# Patient Record
Sex: Male | Born: 1940 | Race: White | Hispanic: No | Smoking: Former smoker
Health system: Southern US, Community
[De-identification: ages and names within clinical notes are randomized; demographics above are authoritative.]

## PROBLEM LIST (undated history)

## (undated) DIAGNOSIS — G309 Alzheimer's disease, unspecified: Secondary | ICD-10-CM

## (undated) DIAGNOSIS — M170 Bilateral primary osteoarthritis of knee: Secondary | ICD-10-CM

## (undated) DIAGNOSIS — J189 Pneumonia, unspecified organism: Secondary | ICD-10-CM

## (undated) DIAGNOSIS — Z974 Presence of external hearing-aid: Secondary | ICD-10-CM

## (undated) DIAGNOSIS — F0280 Dementia in other diseases classified elsewhere without behavioral disturbance: Secondary | ICD-10-CM

## (undated) DIAGNOSIS — M1990 Unspecified osteoarthritis, unspecified site: Secondary | ICD-10-CM

## (undated) DIAGNOSIS — F0390 Unspecified dementia without behavioral disturbance: Secondary | ICD-10-CM

## (undated) DIAGNOSIS — M109 Gout, unspecified: Secondary | ICD-10-CM

## (undated) DIAGNOSIS — C61 Malignant neoplasm of prostate: Secondary | ICD-10-CM

## (undated) HISTORY — PX: PROSTATE BIOPSY: SHX241

## (undated) HISTORY — DX: Alzheimer's disease, unspecified: G30.9

## (undated) HISTORY — DX: Unspecified osteoarthritis, unspecified site: M19.90

## (undated) HISTORY — PX: INSERTION PROSTATE RADIATION SEED: SUR718

## (undated) HISTORY — DX: Presence of external hearing-aid: Z97.4

## (undated) HISTORY — DX: Bilateral primary osteoarthritis of knee: M17.0

## (undated) HISTORY — PX: HEMORROIDECTOMY: SUR656

## (undated) HISTORY — PX: TONSILLECTOMY AND ADENOIDECTOMY: SUR1326

## (undated) HISTORY — PX: COLONOSCOPY: SHX174

## (undated) HISTORY — DX: Dementia in other diseases classified elsewhere, unspecified severity, without behavioral disturbance, psychotic disturbance, mood disturbance, and anxiety: F02.80

---

## 1997-07-09 ENCOUNTER — Encounter: Admission: RE | Admit: 1997-07-09 | Discharge: 1997-10-07 | Payer: Self-pay | Admitting: *Deleted

## 2003-03-19 ENCOUNTER — Encounter: Admission: RE | Admit: 2003-03-19 | Discharge: 2003-03-19 | Payer: Self-pay | Admitting: Family Medicine

## 2003-09-05 ENCOUNTER — Encounter: Admission: RE | Admit: 2003-09-05 | Discharge: 2003-09-05 | Payer: Self-pay | Admitting: Family Medicine

## 2003-09-17 ENCOUNTER — Encounter: Admission: RE | Admit: 2003-09-17 | Discharge: 2003-09-17 | Payer: Self-pay | Admitting: Family Medicine

## 2004-01-09 ENCOUNTER — Ambulatory Visit: Payer: Self-pay | Admitting: Family Medicine

## 2004-01-15 ENCOUNTER — Ambulatory Visit: Payer: Self-pay | Admitting: Family Medicine

## 2004-01-25 ENCOUNTER — Ambulatory Visit: Payer: Self-pay | Admitting: Family Medicine

## 2004-01-29 ENCOUNTER — Encounter: Admission: RE | Admit: 2004-01-29 | Discharge: 2004-01-29 | Payer: Self-pay | Admitting: Family Medicine

## 2004-04-15 ENCOUNTER — Ambulatory Visit: Payer: Self-pay | Admitting: Family Medicine

## 2004-05-28 ENCOUNTER — Ambulatory Visit: Payer: Self-pay | Admitting: Family Medicine

## 2004-08-01 ENCOUNTER — Ambulatory Visit: Payer: Self-pay | Admitting: Family Medicine

## 2004-10-22 ENCOUNTER — Ambulatory Visit: Payer: Self-pay | Admitting: Family Medicine

## 2004-11-10 ENCOUNTER — Ambulatory Visit: Payer: Self-pay | Admitting: Family Medicine

## 2004-11-12 ENCOUNTER — Encounter: Admission: RE | Admit: 2004-11-12 | Discharge: 2004-11-12 | Payer: Self-pay | Admitting: Family Medicine

## 2004-11-12 DIAGNOSIS — J8410 Pulmonary fibrosis, unspecified: Secondary | ICD-10-CM

## 2004-11-19 ENCOUNTER — Ambulatory Visit: Payer: Self-pay | Admitting: Family Medicine

## 2004-11-25 ENCOUNTER — Ambulatory Visit: Payer: Self-pay | Admitting: Family Medicine

## 2005-01-07 ENCOUNTER — Ambulatory Visit: Payer: Self-pay | Admitting: Family Medicine

## 2005-02-23 ENCOUNTER — Encounter (INDEPENDENT_AMBULATORY_CARE_PROVIDER_SITE_OTHER): Payer: Self-pay | Admitting: Internal Medicine

## 2005-02-23 LAB — CONVERTED CEMR LAB: PSA: 1.86 ng/mL

## 2005-03-10 ENCOUNTER — Ambulatory Visit: Payer: Self-pay | Admitting: Family Medicine

## 2005-03-25 ENCOUNTER — Ambulatory Visit: Payer: Self-pay | Admitting: Family Medicine

## 2005-04-07 ENCOUNTER — Ambulatory Visit: Payer: Self-pay | Admitting: Family Medicine

## 2005-04-08 ENCOUNTER — Ambulatory Visit: Payer: Self-pay | Admitting: Family Medicine

## 2005-04-23 ENCOUNTER — Ambulatory Visit: Payer: Self-pay | Admitting: Internal Medicine

## 2005-04-24 ENCOUNTER — Ambulatory Visit: Payer: Self-pay | Admitting: Family Medicine

## 2005-11-24 ENCOUNTER — Ambulatory Visit: Payer: Self-pay | Admitting: Family Medicine

## 2005-11-27 ENCOUNTER — Ambulatory Visit: Payer: Self-pay | Admitting: Family Medicine

## 2006-01-11 ENCOUNTER — Ambulatory Visit: Payer: Self-pay | Admitting: Family Medicine

## 2006-07-22 ENCOUNTER — Ambulatory Visit: Payer: Self-pay | Admitting: *Deleted

## 2006-08-09 ENCOUNTER — Telehealth (INDEPENDENT_AMBULATORY_CARE_PROVIDER_SITE_OTHER): Payer: Self-pay | Admitting: *Deleted

## 2006-08-19 ENCOUNTER — Telehealth (INDEPENDENT_AMBULATORY_CARE_PROVIDER_SITE_OTHER): Payer: Self-pay | Admitting: *Deleted

## 2006-09-28 ENCOUNTER — Ambulatory Visit: Payer: Self-pay | Admitting: Family Medicine

## 2006-10-13 ENCOUNTER — Telehealth (INDEPENDENT_AMBULATORY_CARE_PROVIDER_SITE_OTHER): Payer: Self-pay | Admitting: *Deleted

## 2006-10-15 ENCOUNTER — Ambulatory Visit: Payer: Self-pay | Admitting: Family Medicine

## 2006-10-18 ENCOUNTER — Ambulatory Visit: Payer: Self-pay | Admitting: Internal Medicine

## 2006-10-22 ENCOUNTER — Ambulatory Visit: Payer: Self-pay | Admitting: Family Medicine

## 2006-10-28 ENCOUNTER — Ambulatory Visit: Payer: Self-pay | Admitting: Internal Medicine

## 2006-10-28 ENCOUNTER — Encounter: Payer: Self-pay | Admitting: Internal Medicine

## 2006-10-28 DIAGNOSIS — Z86010 Personal history of colonic polyps: Secondary | ICD-10-CM

## 2006-11-01 ENCOUNTER — Encounter: Admission: RE | Admit: 2006-11-01 | Discharge: 2007-01-30 | Payer: Self-pay | Admitting: Family Medicine

## 2006-11-11 ENCOUNTER — Telehealth: Payer: Self-pay | Admitting: Family Medicine

## 2006-12-16 ENCOUNTER — Encounter (INDEPENDENT_AMBULATORY_CARE_PROVIDER_SITE_OTHER): Payer: Self-pay | Admitting: Internal Medicine

## 2006-12-24 ENCOUNTER — Ambulatory Visit: Payer: Self-pay | Admitting: Family Medicine

## 2007-01-05 ENCOUNTER — Encounter (INDEPENDENT_AMBULATORY_CARE_PROVIDER_SITE_OTHER): Payer: Self-pay | Admitting: Internal Medicine

## 2007-01-05 ENCOUNTER — Telehealth (INDEPENDENT_AMBULATORY_CARE_PROVIDER_SITE_OTHER): Payer: Self-pay | Admitting: Internal Medicine

## 2007-02-16 ENCOUNTER — Encounter (INDEPENDENT_AMBULATORY_CARE_PROVIDER_SITE_OTHER): Payer: Self-pay | Admitting: Internal Medicine

## 2007-03-01 ENCOUNTER — Ambulatory Visit: Payer: Self-pay | Admitting: Family Medicine

## 2007-03-04 ENCOUNTER — Telehealth (INDEPENDENT_AMBULATORY_CARE_PROVIDER_SITE_OTHER): Payer: Self-pay | Admitting: Internal Medicine

## 2007-03-15 ENCOUNTER — Telehealth (INDEPENDENT_AMBULATORY_CARE_PROVIDER_SITE_OTHER): Payer: Self-pay | Admitting: Internal Medicine

## 2007-03-18 ENCOUNTER — Encounter: Admission: RE | Admit: 2007-03-18 | Discharge: 2007-03-18 | Payer: Self-pay | Admitting: Internal Medicine

## 2007-03-18 ENCOUNTER — Ambulatory Visit: Payer: Self-pay | Admitting: Internal Medicine

## 2007-03-18 DIAGNOSIS — J984 Other disorders of lung: Secondary | ICD-10-CM

## 2007-03-22 LAB — CONVERTED CEMR LAB
BUN: 13 mg/dL (ref 6–23)
CO2: 30 meq/L (ref 19–32)
Calcium: 9.5 mg/dL (ref 8.4–10.5)
Chloride: 102 meq/L (ref 96–112)
Creatinine, Ser: 1.2 mg/dL (ref 0.4–1.5)
GFR calc Af Amer: 77 mL/min
GFR calc non Af Amer: 64 mL/min
Glucose, Bld: 99 mg/dL (ref 70–99)
Potassium: 4.1 meq/L (ref 3.5–5.1)
Sodium: 139 meq/L (ref 135–145)

## 2007-04-05 ENCOUNTER — Encounter (INDEPENDENT_AMBULATORY_CARE_PROVIDER_SITE_OTHER): Payer: Self-pay | Admitting: Internal Medicine

## 2007-04-05 ENCOUNTER — Ambulatory Visit: Payer: Self-pay | Admitting: Family Medicine

## 2007-04-06 ENCOUNTER — Encounter (INDEPENDENT_AMBULATORY_CARE_PROVIDER_SITE_OTHER): Payer: Self-pay | Admitting: Internal Medicine

## 2007-04-07 ENCOUNTER — Encounter: Admission: RE | Admit: 2007-04-07 | Discharge: 2007-04-07 | Payer: Self-pay | Admitting: Family Medicine

## 2007-04-08 ENCOUNTER — Telehealth (INDEPENDENT_AMBULATORY_CARE_PROVIDER_SITE_OTHER): Payer: Self-pay | Admitting: Internal Medicine

## 2007-04-11 ENCOUNTER — Ambulatory Visit: Payer: Self-pay | Admitting: Cardiovascular Disease

## 2007-04-27 ENCOUNTER — Ambulatory Visit: Payer: Self-pay | Admitting: Cardiology

## 2007-04-27 ENCOUNTER — Encounter: Payer: Self-pay | Admitting: Cardiovascular Disease

## 2007-04-27 ENCOUNTER — Ambulatory Visit: Payer: Self-pay

## 2007-12-08 ENCOUNTER — Ambulatory Visit: Payer: Self-pay | Admitting: Family Medicine

## 2008-05-03 ENCOUNTER — Ambulatory Visit: Payer: Self-pay | Admitting: Family Medicine

## 2008-05-14 ENCOUNTER — Telehealth: Payer: Self-pay | Admitting: Family Medicine

## 2008-08-17 ENCOUNTER — Ambulatory Visit: Payer: Self-pay | Admitting: Family Medicine

## 2008-08-21 ENCOUNTER — Telehealth (INDEPENDENT_AMBULATORY_CARE_PROVIDER_SITE_OTHER): Payer: Self-pay | Admitting: Internal Medicine

## 2008-10-30 ENCOUNTER — Telehealth (INDEPENDENT_AMBULATORY_CARE_PROVIDER_SITE_OTHER): Payer: Self-pay | Admitting: Internal Medicine

## 2008-12-26 ENCOUNTER — Encounter (INDEPENDENT_AMBULATORY_CARE_PROVIDER_SITE_OTHER): Payer: Self-pay | Admitting: Internal Medicine

## 2008-12-26 ENCOUNTER — Ambulatory Visit: Payer: Self-pay | Admitting: Family Medicine

## 2008-12-26 DIAGNOSIS — M109 Gout, unspecified: Secondary | ICD-10-CM | POA: Insufficient documentation

## 2008-12-26 DIAGNOSIS — R413 Other amnesia: Secondary | ICD-10-CM

## 2008-12-31 ENCOUNTER — Ambulatory Visit: Payer: Self-pay | Admitting: Family Medicine

## 2009-01-09 LAB — CONVERTED CEMR LAB
ALT: 26 units/L (ref 0–53)
AST: 46 units/L — ABNORMAL HIGH (ref 0–37)
Albumin: 3.6 g/dL (ref 3.5–5.2)
Alkaline Phosphatase: 62 units/L (ref 39–117)
BUN: 14 mg/dL (ref 6–23)
Bilirubin, Direct: 0.1 mg/dL (ref 0.0–0.3)
CO2: 29 meq/L (ref 19–32)
Calcium: 8.8 mg/dL (ref 8.4–10.5)
Chloride: 106 meq/L (ref 96–112)
Cholesterol: 163 mg/dL (ref 0–200)
Creatinine, Ser: 1.3 mg/dL (ref 0.4–1.5)
GFR calc non Af Amer: 57.89 mL/min (ref >60)
Glucose, Bld: 109 mg/dL — ABNORMAL HIGH (ref 70–99)
HDL: 44.9 mg/dL (ref >39.00)
LDL Cholesterol: 103 mg/dL — ABNORMAL HIGH (ref 0–99)
PSA: 2.55 ng/mL (ref 0.10–4.00)
Potassium: 4.4 meq/L (ref 3.5–5.1)
Sodium: 144 meq/L (ref 135–145)
TSH: 2.68 microintl units/mL (ref 0.35–5.50)
Total Bilirubin: 0.8 mg/dL (ref 0.3–1.2)
Total CHOL/HDL Ratio: 4
Total Protein: 7 g/dL (ref 6.0–8.3)
Triglycerides: 78 mg/dL (ref 0.0–149.0)
VLDL: 15.6 mg/dL (ref 0.0–40.0)

## 2009-03-14 ENCOUNTER — Ambulatory Visit: Payer: Self-pay | Admitting: Family Medicine

## 2009-03-15 ENCOUNTER — Telehealth: Payer: Self-pay | Admitting: Family Medicine

## 2009-04-03 ENCOUNTER — Ambulatory Visit: Payer: Self-pay | Admitting: Family Medicine

## 2009-05-01 ENCOUNTER — Telehealth: Payer: Self-pay | Admitting: Family Medicine

## 2009-05-01 ENCOUNTER — Ambulatory Visit: Payer: Self-pay | Admitting: Family Medicine

## 2009-05-01 DIAGNOSIS — K5289 Other specified noninfective gastroenteritis and colitis: Secondary | ICD-10-CM | POA: Insufficient documentation

## 2009-06-26 ENCOUNTER — Ambulatory Visit: Payer: Self-pay | Admitting: Family Medicine

## 2009-06-27 LAB — CONVERTED CEMR LAB
ALT: 21 units/L (ref 0–53)
AST: 33 units/L (ref 0–37)
Albumin: 3.8 g/dL (ref 3.5–5.2)
Alkaline Phosphatase: 73 units/L (ref 39–117)
Bilirubin, Direct: 0.1 mg/dL (ref 0.0–0.3)
Cholesterol: 144 mg/dL (ref 0–200)
HDL: 42.3 mg/dL (ref >39.00)
LDL Cholesterol: 74 mg/dL (ref 0–99)
Total Bilirubin: 0.5 mg/dL (ref 0.3–1.2)
Total CHOL/HDL Ratio: 3
Total Protein: 6.5 g/dL (ref 6.0–8.3)
Triglycerides: 140 mg/dL (ref 0.0–149.0)
VLDL: 28 mg/dL (ref 0.0–40.0)

## 2009-10-08 ENCOUNTER — Encounter (INDEPENDENT_AMBULATORY_CARE_PROVIDER_SITE_OTHER): Payer: Self-pay | Admitting: *Deleted

## 2009-10-21 ENCOUNTER — Encounter (INDEPENDENT_AMBULATORY_CARE_PROVIDER_SITE_OTHER): Payer: Self-pay | Admitting: *Deleted

## 2009-11-28 ENCOUNTER — Encounter (INDEPENDENT_AMBULATORY_CARE_PROVIDER_SITE_OTHER): Payer: Self-pay | Admitting: *Deleted

## 2009-12-02 ENCOUNTER — Ambulatory Visit: Payer: Self-pay | Admitting: Internal Medicine

## 2009-12-17 ENCOUNTER — Ambulatory Visit: Payer: Self-pay | Admitting: Internal Medicine

## 2009-12-20 ENCOUNTER — Encounter: Payer: Self-pay | Admitting: Internal Medicine

## 2009-12-26 ENCOUNTER — Ambulatory Visit: Payer: Self-pay | Admitting: Family Medicine

## 2010-01-09 ENCOUNTER — Telehealth: Payer: Self-pay | Admitting: Family Medicine

## 2010-01-31 ENCOUNTER — Ambulatory Visit: Payer: Self-pay | Admitting: Family Medicine

## 2010-01-31 DIAGNOSIS — M25559 Pain in unspecified hip: Secondary | ICD-10-CM

## 2010-02-03 ENCOUNTER — Telehealth: Payer: Self-pay | Admitting: Family Medicine

## 2010-02-06 ENCOUNTER — Telehealth: Payer: Self-pay | Admitting: Family Medicine

## 2010-03-27 NOTE — Progress Notes (Signed)
Summary: pt requests phone call regarding Aricept  Phone Note Call from Patient Call back at Home Phone [phone]   Caller: Patient Call For: Ruthe Mannan MD Summary of Call: Pt is asking that you call him regarding problems with aricept.  He saw something about this on tv and wants to know if you are aware. Initial call taken by: Lowella Petties CMA, AAMA,  January 09, 2010 10:16 AM  Follow-up for Phone Call        Weidman, would you mind calling him since I am in with patients to find out his concern. thanks, talia. Ruthe Mannan MD  January 09, 2010 10:38 AM  Patient says that he went to eat out with some friends and they were talking about how they had seen on tv where Aricept had some serious side effects.  He did not recall what those side effects where.  I asked patient if he would be willing to come in to see Dr. Dayton Martes to discuss this if need be and he agreed to do so.  Please advise.  Linde Gillis CMA Duncan Dull)  January 09, 2010 10:56 AM   Additional Follow-up for Phone Call Additional follow up Details #1::        yes he can do that.  He has been on Aricept for years without issue so he should not be concerned.  At his dosage, very safe. Ruthe Mannan MD  January 09, 2010 11:07 AM  Patient advised as instructed via telephone.  He says that he trusts Dr. Dellie Catholic judgement and is ok with her decision.    Additional Follow-up by: Linde Gillis CMA Duncan Dull),  January 09, 2010 11:17 AM

## 2010-03-27 NOTE — Miscellaneous (Signed)
Summary: [facility]  Clinical Lists Changes  Medications: Added new medication of MOVIPREP 100 GM  SOLR (PEG-KCL-NACL-NASULF-NA ASC-C) As per prep instructions. - Signed Rx of MOVIPREP 100 GM  SOLR (PEG-KCL-NACL-NASULF-NA ASC-C) As per prep instructions.;  #1 x 0;  Signed;  Entered by: Ezra Sites RN;  Authorized by: Iva Boop MD, [facility];  Method used: Electronically to [facility]*, [address], [place], [place]  [address], Ph: [phone], Fax: [phone] Allergies: Changed allergy or adverse reaction from ERYTHROMYCIN ETHYLSUCCINATE to ERYTHROMYCIN ETHYLSUCCINATE Observations: Added new observation of ALLERGY REV: Done (12/02/2009 10:10)    Prescriptions: MOVIPREP 100 GM  SOLR (PEG-KCL-NACL-NASULF-NA ASC-C) As per prep instructions.  #1 x 0   Entered by:   Ezra Sites RN   Authorized by:   Iva Boop MD, [facility]   Signed by:   Ezra Sites RN on 12/02/2009   Method used:   Electronically to        [facility]* (retail)       [address]       [place], [place]  [address]       Ph: [phone]       Fax: [phone]   RxID:   [id number]

## 2010-03-27 NOTE — Progress Notes (Signed)
Summary: fever, diarrhea  Phone Note Call from Patient Call back at Home Phone [phone]   Caller: Patient Call For: Hannah Beat MD Summary of Call: Patient says that he has fever, he is not sure what the temp is because he has no way to check it, he just feels hot to the touch and feels like he has fever. He has had diarrhea for two days and has been taking immodium and it has not helped. He says that he alos is aching all over. He wants to come in and be seen or have something called in.. Do I need to add him to the schedule? [facility].  Initial call taken by: Melody Comas,  May 01, 2009 8:57 AM  Follow-up for Phone Call        i can see at 4:15 Follow-up by: Hannah Beat MD,  May 01, 2009 9:08 AM  Additional Follow-up for Phone Call Additional follow up Details #1::        Patient coming to appt at 4:15 Additional Follow-up by: Benny Lennert CMA Duncan Dull),  May 01, 2009 9:12 AM

## 2010-03-27 NOTE — Letter (Signed)
Summary: Colonoscopy Letter  [facility]  [address] [place], [place] [address]   Phone: [phone]  Fax: [phone]      October 08, 2009 MRN: [id number]   Ricky Boyd [address] [place], [place]  [address]   Dear Mr. FAUTH,   According to your medical record, it is time for you to schedule a Colonoscopy. The American Cancer Society recommends this procedure as a method to detect early colon cancer. Patients with a family history of colon cancer, or a personal history of colon polyps or inflammatory bowel disease are at increased risk.  This letter has beeen generated based on the recommendations made at the time of your procedure. If you feel that in your particular situation this may no longer apply, please contact our office.  Please call our office at [phone] to schedule this appointment or to update your records at your earliest convenience.  Thank you for cooperating with [place] to provide you with the very best care possible.   Sincerely,   Iva Boop, M.D.  [facility]astroenterology Division [phone]

## 2010-03-27 NOTE — Assessment & Plan Note (Signed)
Summary: having memory loss/alc   Vital Signs:  Patient profile:   70 year old male Height:      69 inches Weight:      200 pounds BMI:     29.64 Temp:     97.6 degrees F oral Pulse rate:   80 / minute Pulse rhythm:   regular BP sitting:   110 / 70  (right arm) Cuff size:   regular  Vitals Entered By: Linde Gillis CMA Duncan Dull) (December 26, 2009 11:55 AM) CC: discuss memory issues   History of Present Illness: 70 yo here with his son to discuss his memory problems.  Has been on Aricept 10 mg for over 3 years. Son feels he is doing well and so does [name] but his wife is concerned that it is progressing. At times, has been in his car and will forget where he was going- always remember in a few minutes. Has not had any issues remembering people and overall does well with short term memory.  No anxiety or behavioral issues.  See geriatrics assessment for MMSE.  Allergies: 1)  ! Erythromycin Ethylsuccinate 2)  ! Ibuprofen (Ibuprofen)  Review of Systems      See HPI Psych:  Denies anxiety and depression.  Physical Exam  General:  Well-developed,well-nourished,in no acute distress; alert,appropriate and cooperative throughout examination Psych:  normally interactive, good eye contact, not anxious appearing, and not depressed appearing.     Impression & Recommendations:  Problem # 1:  MEMORY LOSS (ICD-780.93) Assessment Unchanged Time spent with patient 25 minutes, more than 50% of this time was spent evaluating and discussing his memory loss.  Did very well on MMSE.  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 Continue current dose of Aricept.  Pt and son in agreement with plan.  Complete Medication List: 1)  Multivitamins Tabs (Multiple vitamin) .... Take one by mouth daily 2)  Glucosamine Complex Tabs (Nutritional supplements) 3)  Fish Oil 1000 Mg Caps (Omega-3 fatty acids) .... Take two by mouth daily 4)  Saw Palmetto 500 Mg Caps (Saw palmetto (serenoa  repens)) .... Take two by mouth daily 5)  Tylenol Extra Strength 500 Mg Tabs (Acetaminophen) .... As needed 6)  Vitamin C  .... Take one by mouth daily 7)  Aricept 10 Mg Tabs (Donepezil hcl) .... Take 1 tablet by mouth once a day   Orders Added: 1)  Est. Patient Level IV [[address]]    Current Allergies (reviewed today): ! ERYTHROMYCIN ETHYLSUCCINATE ! IBUPROFEN (IBUPROFEN)   Geriatric Assessment:  Mental Status Exam: (value/max value)    Orientation to Time: 5/5    Orientation to Place: 5/5    Registration: 3/3    Attention/Calculation: 5/5    Recall: 2/3    Language-name 2 objects: 2/2    Language-repeat: 1/1    Language-follow 3-step command: 3/3    Language-read and follow direction: 1/1    Write a sentence: 1/1    Copy design: 1/1 MSE Total score: 29/30

## 2010-03-27 NOTE — Letter (Signed)
Summary: Moviprep Instructions  [facility]  [address]   [place], [place] [address]   Phone: [phone]  Fax: [phone]       Ricky Boyd    03-28-1940    MRN: [id number]        Procedure Day Dorna Bloom: Tuesday, 12-17-09     Arrival Time: 9:00 a.m.     Procedure Time: 10:00 a.m.     Location of Procedure:                    x   [facility] (4th Floor)   PREPARATION FOR COLONOSCOPY WITH MOVIPREP   Starting 5 days prior to your procedure 12-12-09  do not eat nuts, seeds, popcorn, corn, beans, peas,  salads, or any raw vegetables.  Do not take any fiber supplements (e.g. Metamucil, Citrucel, and Benefiber).  THE DAY BEFORE YOUR PROCEDURE         DATE: 12-16-09   DAY: Monday  1.  Drink clear liquids the entire day-NO SOLID FOOD  2.  Do not drink anything colored red or purple.  Avoid juices with pulp.  No orange juice.  3.  Drink at least 64 oz. (8 glasses) of fluid/clear liquids during the day to prevent dehydration and help the prep work efficiently.  CLEAR LIQUIDS INCLUDE: Water Jello Ice Popsicles Tea (sugar ok, no milk/cream) Powdered fruit flavored drinks Coffee (sugar ok, no milk/cream) Gatorade Juice: apple, white grape, white cranberry  Lemonade Clear bullion, consomm, broth Carbonated beverages (any kind) Strained chicken noodle soup Hard Candy                             4.  In the morning, mix first dose of MoviPrep solution:    Empty 1 Pouch A and 1 Pouch B into the disposable container    Add lukewarm drinking water to the top line of the container. Mix to dissolve    Refrigerate (mixed solution should be used within 24 hrs)  5.  Begin drinking the prep at 5:00 p.m. The MoviPrep container is divided by 4 marks.   Every 15 minutes drink the solution down to the next mark (approximately 8 oz) until the full liter is complete.   6.  Follow completed prep with 16 oz of clear liquid of your choice (Nothing red or  purple).  Continue to drink clear liquids until bedtime.  7.  Before going to bed, mix second dose of MoviPrep solution:    Empty 1 Pouch A and 1 Pouch B into the disposable container    Add lukewarm drinking water to the top line of the container. Mix to dissolve    Refrigerate  THE DAY OF YOUR PROCEDURE      DATE: 12-17-09  DAY: Tuesday  Beginning at 5:00 a.m. (5 hours before procedure):         1. Every 15 minutes, drink the solution down to the next mark (approx 8 oz) until the full liter is complete.  2. Follow completed prep with 16 oz. of clear liquid of your choice.    3. You may drink clear liquids until  8:00 a.m.  (2 HOURS BEFORE PROCEDURE).   MEDICATION INSTRUCTIONS  Unless otherwise instructed, you should take regular prescription medications with a small sip of water   as early as possible the morning of your procedure.        OTHER INSTRUCTIONS  You will need a  responsible adult at least 72 years of age to accompany you and drive you home.   This person must remain in the waiting room during your procedure.  Wear loose fitting clothing that is easily removed.  Leave jewelry and other valuables at home.  However, you may wish to bring a book to read or  an iPod/MP3 player to listen to music as you wait for your procedure to start.  Remove all body piercing jewelry and leave at home.  Total time from sign-in until discharge is approximately 2-3 hours.  You should go home directly after your procedure and rest.  You can resume normal activities the  day after your procedure.  The day of your procedure you should not:   Drive   Make legal decisions   Operate machinery   Drink alcohol   Return to work  You will receive specific instructions about eating, activities and medications before you leave.    The above instructions have been reviewed and explained to me by   Ezra Sites RN  December 02, 2009 10:43 AM     I fully understand and can  verbalize these instructions _____________________________ Date _________

## 2010-03-27 NOTE — Assessment & Plan Note (Signed)
Summary: fever,diahrrea   Vital Signs:  Patient profile:   70 year old male Height:      69 inches Weight:      206.0 pounds BMI:     30.53 Temp:     97.6 degrees F oral Pulse rate:   92 / minute Pulse rhythm:   regular BP sitting:   110 / 70  (left arm) Cuff size:   large  Vitals Entered By: Benny Lennert CMA Duncan Dull) (May 01, 2009 3:49 PM)  History of Present Illness: Chief complaint fever diarrhea head ache chills and sweating  70 year old patient:  Aches and pains, chills, woke up around tuesday morning and went to the bathroom and started to shake uncontrollably. Profuse sweating. Headaches, diarrhea. Not sure about fever.   Acute Visit History:      The patient complains of diarrhea, fever, and musculoskeletal symptoms.  These symptoms began 3 days ago.  He denies cough, earache, genitourinary symptoms, headache, nasal discharge, nausea, sinus problems, sore throat, and vomiting.        Urine output has been slightly decreased.        Allergies: 1)  ! Erythromycin Ethylsuccinate 2)  ! Ibuprofen (Ibuprofen)  Past History:  Past medical, surgical, family and social histories (including risk factors) reviewed, and no changes noted (except as noted below).  Past Medical History: Reviewed history from 12/26/2008 and no changes required. Gout (03/2002) Colonic polyps, hx of (10/28/2006)  Past Surgical History: Reviewed history from 12/26/2008 and no changes required. Tonsillectomy (1956) Hemorrhoidectomy (1985) Left hernia repair (1998) Spirometry negative (09/2004) MRI Left elbow epicondylitis( 07/2004) CT Chest negative (02/2003), ---7/05 no change--, 1/09--no change CT Sinuses ENT (04/2001) Colonoscopy polyp (10/28/2006)--6/91--neg Sigmoidoscopy negative (2000)--3/95, 12/2008--moderately severe obstruction TEE--3/09--EF 60-65%, mild aortic regurg, no PAH findings  Family History: Reviewed history and no changes required.  Social History: Reviewed history  from 12/26/2008 and no changes required. Marital Status: Married Children: 3 Occupation: retired from teaching--driver's ed  Review of Systems       REVIEW OF SYSTEMS GEN: Acute illness details above. CV: No chest pain or SOB GI: No noted N or V Otherwise, pertinent positives and negatives are noted in the HPI.   Physical Exam  General:  Well-developed,well-nourished,in no acute distress; alert,appropriate and cooperative throughout examination Head:  Normocephalic and atraumatic without obvious abnormalities. No apparent alopecia or balding. Ears:  External ear exam shows no significant lesions or deformities.  Otoscopic examination reveals clear canals, tympanic membranes are intact bilaterally without bulging, retraction, inflammation or discharge. Hearing is grossly normal bilaterally. Nose:  no external deformity.   Mouth:  Oral mucosa and oropharynx without lesions or exudates.  Teeth in good repair. Neck:  No deformities, masses, or tenderness noted. Lungs:  Normal respiratory effort, chest expands symmetrically. Lungs are clear to auscultation, no crackles or wheezes. Heart:  Normal rate and regular rhythm. S1 and S2 normal without gallop, murmur, click, rub or other extra sounds. Abdomen:  soft, non-tender, no distention, no masses, no guarding, no rigidity, and bowel sounds hyperactive.     Impression & Recommendations:  Problem # 1:  GASTROENTERITIS (ICD-558.9)  Discussed use of medication and role of diet. Encouraged clear liquids and electrolyte replacement fluids. Instructed to call if any signs of worsening dehydration.   Complete Medication List: 1)  Multivitamins Tabs (Multiple vitamin) .... Take one by mouth daily 2)  Glucosamine Complex Tabs (Nutritional supplements) 3)  Fish Oil 1000 Mg Caps (Omega-3 fatty acids) .... Take two  by mouth daily 4)  Saw Palmetto 500 Mg Caps (Saw palmetto (serenoa repens)) .... Take two by mouth daily 5)  Tylenol Extra Strength 500  Mg Tabs (Acetaminophen) .... As needed 6)  Vitamin E  .... Take one by mouth daily 7)  Vitamin C  .... Take one by mouth daily 8)  Aricept 10 Mg Tabs (Donepezil hcl) .... Take 1 tablet by mouth once a day 9)  Rhinocort Aqua 32 Mcg/act Susp (Budesonide (nasal)) .... 2 sprays each nostril daily as needed  Patient Instructions: 1)  Prevent dehydration: drink Gatorade, Pedialyte, Ginger Ale, popsicles 2)  Immodium A-D over ther counter or Pepto-Bismol  3)  Diet: liquids, advance slowly to bananas, rice, applesauce, grits, toast, baked potato. 4)  No milk, dairy, spicy or fried food.  Current Allergies (reviewed today): ! ERYTHROMYCIN ETHYLSUCCINATE ! IBUPROFEN (IBUPROFEN)

## 2010-03-27 NOTE — Assessment & Plan Note (Signed)
Summary: RIGHT HIP PAIN   Vital Signs:  Patient profile:   70 year old male Height:      69 inches Weight:      203.50 pounds BMI:     30.16 Temp:     97.9 degrees F oral Pulse rate:   76 / minute Pulse rhythm:   regular BP sitting:   130 / 80  (right arm) Cuff size:   regular  Vitals Entered By: Linde Gillis CMA Duncan Dull) (January 31, 2010 11:34 AM) CC: right hip pain   History of Present Illness: 70 yo here for right hip pain.  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  Pain often radiates down leg or into his groin.  Would sometimes feel like it was "out of socket."  1 month ago, was bending down while hunting and felt his right hip hurting again.  Normally, pain improves after a few days but this has been lingering.  Worse while walking. No LE weakness. No pain radiating down leg or to groin this time. No urinary symptoms.  Current Medications (verified): 1)  Multivitamins  Tabs (Multiple Vitamin) .... Take One By Mouth Daily 2)  Glucosamine Complex  Tabs (Nutritional Supplements) 3)  Fish Oil 1000 Mg Caps (Omega-3 Fatty Acids) .... Take Two By Mouth Daily 4)  Saw Palmetto 500 Mg Caps (Saw Palmetto (Serenoa Repens)) .... Take Two By Mouth Daily 5)  Tylenol Extra Strength 500 Mg Tabs (Acetaminophen) .... As Needed 6)  Vitamin C .... Take One By Mouth Daily 7)  Aricept 10 Mg  Tabs (Donepezil Hcl) .... Take 1 Tablet By Mouth Once A Day 8)  Ibuprofen 200 Mg Tabs (Ibuprofen) .... Take As Needed For Hip Pain  Allergies: 1)  ! Erythromycin Ethylsuccinate 2)  ! Ibuprofen (Ibuprofen)  Past History:  Past Medical History: Last updated: 12/26/2008 Gout (03/2002) Colonic polyps, hx of (10/28/2006)  Past Surgical History: Last updated: 12/24/2009 Tonsillectomy (1956) Hemorrhoidectomy (1985) Left hernia repair (1998) Spirometry negative (09/2004) MRI Left elbow epicondylitis( 07/2004) CT Chest negative (02/2003),  ---7/05 no change--, 1/09--no change CT Sinuses ENT (04/2001) Colonoscopy polyp (10/28/2006)--6/91--neg Sigmoidoscopy negative (2000)--3/95, 12/2008--moderately severe obstruction TEE--3/09--EF 60-65%, mild aortic regurg, no PAH findings Colonoscopy Polyp B9 (Dr Leone Payor) (12/17/2009)                    62yr  Social History: Last updated: 12/26/2008 Marital Status: Married Children: 3 Occupation: retired from teaching--driver's ed  Review of Systems      See HPI General:  Denies fever and malaise. MS:  Complains of joint pain and stiffness; denies joint redness, joint swelling, muscle aches, and muscle weakness.  Physical Exam  General:  Well-developed,well-nourished,in no acute distress; alert,appropriate and cooperative throughout examination   Hip Exam  Hip Exam:    Right:    Inspection:  Normal    Palpation:  Normal    Stability:  stable    Tenderness:  trochanteric bursa    Swelling:  no    Erythema:  no    Right hip is lower than left hip    Range of Motion:       Flexion-Active: 120       Extension-Active: 30       Internal Rotation-Active: 45       External Rotation-Active: 45       Flexion-Passive: 120       Extension-Passive: 30  Internal Rotation-Passive: 45       External Rotation: 45    Left:    Inspection:  Normal    Palpation:  Normal    Stability:  stable    Tenderness:  no    Swelling:  no    Erythema:  no    Range of Motion:       Flexion-Active: 120       Extension-Active: 30       Internal Rotation-Active: 45       External Rotation-Active: 45       Flexion-Passive: 120       Extension-Passive: 30       Internal Rotation-Passive: 45       External Rotation: 45   Impression & Recommendations:  Problem # 1:  HIP PAIN, RIGHT (ICD-719.45) Assessment New Likely multifactorial- 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  Would benefit from an orthotic.  Will first get right hip film to rule out any acute findings. His  updated medication list for this problem includes:    Tylenol Extra Strength 500 Mg Tabs (Acetaminophen) .Marland Kitchen... As needed    Ibuprofen 200 Mg Tabs (Ibuprofen) .Marland Kitchen... Take as needed for hip pain  Orders: T-Hip Comp Right Min 2 views (73510TC)  Complete Medication List: 1)  Multivitamins Tabs (Multiple vitamin) .... Take one by mouth daily 2)  Glucosamine Complex Tabs (Nutritional supplements) 3)  Fish Oil 1000 Mg Caps (Omega-3 fatty acids) .... Take two by mouth daily 4)  Saw Palmetto 500 Mg Caps (Saw palmetto (serenoa repens)) .... Take two by mouth daily 5)  Tylenol Extra Strength 500 Mg Tabs (Acetaminophen) .... As needed 6)  Vitamin C  .... Take one by mouth daily 7)  Aricept 10 Mg Tabs (Donepezil hcl) .... Take 1 tablet by mouth once a day 8)  Ibuprofen 200 Mg Tabs (Ibuprofen) .... Take as needed for hip pain   Orders Added: 1)  T-Hip Comp Right Min 2 views [73510TC] 2)  Est. Patient Level IV [[address]]    Current Allergies (reviewed today): ! ERYTHROMYCIN ETHYLSUCCINATE ! IBUPROFEN (IBUPROFEN)

## 2010-03-27 NOTE — Progress Notes (Signed)
Summary: hip pain   Phone Note Call from Patient Call back at Home Phone [phone]   Caller: Patient Call For: Ruthe Mannan MD Summary of Call: Patient is still having hip pain and the ibuprofen and tylenol isn't helping at all. He is asing if you could do referral now. Please advise.  Initial call taken by: Melody Comas,  February 06, 2010 1:37 PM  Follow-up for Phone Call        Referral placed. Ruthe Mannan MD  February 06, 2010 1:38 PM

## 2010-03-27 NOTE — Progress Notes (Signed)
Summary: Sorethroat  Phone Note Call from Patient Call back at [phone]   Caller: Spouse Call For: Dr. Ermalene Searing Summary of Call: Pt saw Dr. Patsy Lager on 03/14/09. Pt said throat was not really bothering him yesterday but today has severe sorethroat. Not sure if fever. Hycodan is helping with the cough. Pt wants med called in for sorethroat. Pt uses [facility] [phone].Please advise.  Initial call taken by: Lewanda Rife LPN,  March 15, 2009 9:30 AM  Follow-up for Phone Call        No great med for sore throat..treat with tylenol during the day.  if this has not helped already..I could send in magic mouth wash gargle..but temporary relief. Follow up if not improving.  Follow-up by: Kerby Nora MD,  March 15, 2009 2:15 PM  Additional Follow-up for Phone Call Additional follow up Details #1::        Patient advised as instructed, he says he feels a whole lot better than he did last night.  Been gargling with warm salt water.  Would like the Rx called to [facility] for the Magic mouth wash. Additional Follow-up by: Linde Gillis CMA Duncan Dull),  March 15, 2009 2:35 PM    Additional Follow-up for Phone Call Additional follow up Details #2::    Cannot find appropropriate motuh wash in EMR..needs lidocaine mouthwash like First Mouth wash to help with sore throat..call pt's pharm to find out what they have. let me know.  Follow-up by: Kerby Nora MD,  March 15, 2009 2:55 PM  Additional Follow-up for Phone Call Additional follow up Details #3:: Details for Additional Follow-up Action Taken: talked to pharamacist about mouth wash also sent prescriptions in Additional Follow-up by: Benny Lennert CMA Duncan Dull),  March 15, 2009 3:01 PM

## 2010-03-27 NOTE — Assessment & Plan Note (Signed)
Summary: 6 M FU/DLO   Vital Signs:  Patient profile:   70 year old male Height:      69 inches Weight:      203 pounds BMI:     30.09 Temp:     97.9 degrees F oral Pulse rate:   80 / minute Pulse rhythm:   regular BP sitting:   122 / 80  (left arm) Cuff size:   large  Vitals Entered By: Delilah Shan CMA Duncan Dull) (Jun 26, 2009 12:01 PM) CC: 6 months follow up   History of Present Illness: 70 yo here for follow up elevated lipids (per [place]).  Lipids looked quite good at [facility] in November. LDL 103, HDL 44, TG 78.  Fasting glucose was 106. [name] asked him to come back in today to have rechecked.  Not having any other issues or complaints.  Current Medications (verified): 1)  Multivitamins  Tabs (Multiple Vitamin) .... Take One By Mouth Daily 2)  Glucosamine Complex  Tabs (Nutritional Supplements) 3)  Fish Oil 1000 Mg Caps (Omega-3 Fatty Acids) .... Take Two By Mouth Daily 4)  Saw Palmetto 500 Mg Caps (Saw Palmetto (Serenoa Repens)) .... Take Two By Mouth Daily 5)  Tylenol Extra Strength 500 Mg Tabs (Acetaminophen) .... As Needed 6)  Vitamin E .... Take One By Mouth Daily 7)  Vitamin C .... Take One By Mouth Daily 8)  Aricept 10 Mg  Tabs (Donepezil Hcl) .... Take 1 Tablet By Mouth Once A Day 9)  Rhinocort Aqua 32 Mcg/act  Susp (Budesonide (Nasal)) .... 2 Sprays Each Nostril Daily As Needed  Allergies: 1)  ! Erythromycin Ethylsuccinate 2)  ! Ibuprofen (Ibuprofen)  Review of Systems      See HPI General:  Denies chills and fatigue. GU:  Denies dysuria, incontinence, urinary frequency, and urinary hesitancy.  Physical Exam  General:  Well-developed,well-nourished,in no acute distress; alert,appropriate and cooperative throughout examination Mouth:  Oral mucosa and oropharynx without lesions or exudates.  Teeth in good repair. Lungs:  Normal respiratory effort, chest expands symmetrically. Lungs are clear to auscultation, no crackles or wheezes. Heart:  Normal rate and  regular rhythm. S1 and S2 normal without gallop, murmur, click, rub or other extra sounds. Extremities:  no edema either lower leg Psych:  normally interactive, good eye contact, not anxious appearing, and not depressed appearing.     Impression & Recommendations:  Problem # 1:  SCREENING FOR LIPOID DISORDERS (ICD-V77.91) Assessment Unchanged Recheck lipids today. Cannot check fasting CBG as he is not fasting. Orders: Venipuncture ([address]) TLB-Lipid Panel (80061-LIPID) TLB-Hepatic/Liver Function Pnl (80076-HEPATIC)  Complete Medication List: 1)  Multivitamins Tabs (Multiple vitamin) .... Take one by mouth daily 2)  Glucosamine Complex Tabs (Nutritional supplements) 3)  Fish Oil 1000 Mg Caps (Omega-3 fatty acids) .... Take two by mouth daily 4)  Saw Palmetto 500 Mg Caps (Saw palmetto (serenoa repens)) .... Take two by mouth daily 5)  Tylenol Extra Strength 500 Mg Tabs (Acetaminophen) .... As needed 6)  Vitamin E  .... Take one by mouth daily 7)  Vitamin C  .... Take one by mouth daily 8)  Aricept 10 Mg Tabs (Donepezil hcl) .... Take 1 tablet by mouth once a day 9)  Rhinocort Aqua 32 Mcg/act Susp (Budesonide (nasal)) .... 2 sprays each nostril daily as needed  Current Allergies (reviewed today): ! ERYTHROMYCIN ETHYLSUCCINATE ! IBUPROFEN (IBUPROFEN)

## 2010-03-27 NOTE — Procedures (Signed)
Summary: Colonoscopy TV ADENOMA  Patient: Ricky Boyd Note: All result statuses are Final unless otherwise noted.  Tests: (1) Colonoscopy (COL)   COL Colonoscopy           DONE     [facility]     [address]     [place], [place]  [address]           COLONOSCOPY PROCEDURE REPORT           PATIENT:  [name], [name]  MR#:  [id number]     BIRTHDATE:  [birth year], 69 yrs. 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  Digital rectal exam was performed and     revealed no abnormalities and normal prostate.   The LB CF-[id number]     [id number] endoscope was introduced through the anus and advanced to     the cecum, which was identified by both the appendix and ileocecal     valve, without limitations.  The quality of the prep was     excellent, using MoviPrep.  The instrument was then slowly     withdrawn as the colon was fully examined. Insertion: 3:36 minutes     Withdrawal: 18:04 minutes     <<PROCEDUREIMAGES>>           FINDINGS:  A sessile polyp was found in the ascending colon. It     was 15 mm in size. Irregular flat, sessile polyp on and mostly     behind a fold. Believe completely removed with normal saline lift     and piecemeal polypectomy. Polyp was snared, then cauterized with     monopolar cautery. Retrieval was successful.   Moderate     diverticulosis was found in the sigmoid colon.  This was otherwise     a normal examination of the colon.    Retroflexed views in the     right colon and rectum revealed no other abnormalities.    The     scope was then withdrawn from the patient and the procedure     completed.           COMPLICATIONS:  None     ENDOSCOPIC IMPRESSION:     1) 15 mm sessile polyp in the ascending colon - removed     2) Moderate diverticulosis in the sigmoid colon     3) Otherwise normal examination, excellent prep     4) Prior small tubulovillous adenoma removed (2008)     RECOMMENDATIONS:     1) Hold aspirin, aspirin products, and anti-inflammatory     medication for 2 weeks.     REPEAT EXAM:  In for Colonoscopy, pending biopsy results.           Iva Boop, MD,  FACG           CC:  The Patient           n.     eSIGNED:   Iva Boop at 12/17/2009 11:21 AM           Daleen Squibb, Jeannett Senior, [phone]  Note: An exclamation mark (!) indicates a result that was not dispersed into the flowsheet. Document Creation Date: 12/17/2009 11:22 AM _______________________________________________________________________  (1) Order result status: Final Collection or observation date-time: 12/17/2009 11:13 Requested date-time:  Receipt date-time:  Reported date-time:  Referring Physician:   Ordering Physician: Stan Head [phone]) Specimen Source:  Source: Launa Grill Order Number: [id number] Lab site:   Appended Document: Colonoscopy   Colonoscopy  Procedure date:  12/17/2009  Findings:          1) 15 mm sessile polyp in the ascending colon - removed TUBULOVILLOUS ADENOMA     2) Moderate diverticulosis in the sigmoid colon     3) Otherwise normal examination, excellent prep     4) Prior small tubulovillous adenoma removed (2008)  Comments:      Repeat colonoscopy in 1 year.     Procedures Next Due Date:    Colonoscopy: 12/2010   Appended Document: Colonoscopy TV ADENOMA     Procedures Next Due Date:    Colonoscopy: 11/2010

## 2010-03-27 NOTE — Letter (Signed)
Summary: Patient Notice- Polyp Results  [facility]  [address] [place], [place] [address]   Phone: [phone]  Fax: [phone]        December 20, 2009 MRN: [id number]    [facility] [address] [place], [place]  [address]    Dear Mr. PRATS,  The polyp removed from your colon was adenomatous. This means that it was pre-cancerous or that  it had the potential to change into cancer over time.   I recommend that you have a repeat colonoscopy in 1 year to determine if you have developed any new polyps over time. If you develop any new rectal bleeding, abdominal pain or significant bowel habit changes, please contact us before then.  In addition to repeating colonoscopy, changing health habits may reduce your risk of having more colon polyps and possibly, colon cancer. You may lower your risk of future polyps and colon cancer by adopting healthy habits such as not smoking or using tobacco (if you do), being physically active, losing weight (if overweight), and eating a diet which includes fruits and vegetables and limits red meat.  Please call us if you are having persistent problems or have questions about your condition that have not been fully answered at this time.   Sincerely,  Iva Boop MD, [facility]  This letter has been electronically signed by your physician.  Appended Document: Patient Notice- Polyp Results letter mailed

## 2010-03-27 NOTE — Assessment & Plan Note (Signed)
Summary: FOLLOW UP- NOT BETTER/ lb   Vital Signs:  Patient profile:   70 year old male Height:      69 inches Weight:      208.25 pounds BMI:     30.86 Temp:     97.7 degrees F oral Pulse rate:   92 / minute Pulse rhythm:   regular BP sitting:   122 / 84  (left arm) Cuff size:   large  Vitals Entered By: Delilah Shan CMA Duncan Dull) (April 03, 2009 12:24 PM) CC: follow-up visit - no better   History of Present Illness: Chief complaint still sick.  70 year old male seen on 03/14/09 with URI symtpoms. Advised supportive carea s nlikely viral. Runny nose, cough, sinus pressure. Had body aches all weekend, subjective fevers. Has not improved at all since he was here, feels it is worse. No cough, wheezing or SOB. Taking OTC cold medicine with minimal relief of symptoms. No nv/d.    Current Medications (verified): 1)  Multivitamins  Tabs (Multiple Vitamin) .... Take One By Mouth Daily 2)  Glucosamine Complex  Tabs (Nutritional Supplements) 3)  Fish Oil 1000 Mg Caps (Omega-3 Fatty Acids) .... Take Two By Mouth Daily 4)  Saw Palmetto 500 Mg Caps (Saw Palmetto (Serenoa Repens)) .... Take Two By Mouth Daily 5)  Tylenol Extra Strength 500 Mg Tabs (Acetaminophen) .... As Needed 6)  Vitamin E .... Take One By Mouth Daily 7)  Vitamin C .... Take One By Mouth Daily 8)  Aricept 10 Mg  Tabs (Donepezil Hcl) .... Take 1 Tablet By Mouth Once A Day 9)  Rhinocort Aqua 32 Mcg/act  Susp (Budesonide (Nasal)) .... 2 Sprays Each Nostril Daily As Needed 10)  Hycodan Syrup .Marland Kitchen.. 1 - 2 Tsp By Mouth At Bedtime As Needed Cough 11)  Augmentin 875-125 Mg Tabs (Amoxicillin-Pot Clavulanate) .Marland Kitchen.. 1 By Mouth 2 Times Daily X 10 Days  Allergies: 1)  ! Erythromycin Ethylsuccinate 2)  ! Ibuprofen (Ibuprofen)  Review of Systems      See HPI General:  Complains of chills and fever. ENT:  Complains of earache, postnasal drainage, sinus pressure, and sore throat. CV:  Denies chest pain or discomfort. Resp:   Denies cough, shortness of breath, sputum productive, and wheezing.  Physical Exam  General:  alert, well-developed, well-nourished, and well-hydrated.  NAD Ears:  hearing aide each ear--canals clear Nose:  nasal dischargemucosal pallor.   Mouth:  good dentition, no exudates, and pharyngeal erythema.   Lungs:  normal respiratory effort, no intercostal retractions, no accessory muscle use, and normal breath sounds.   Heart:  normal rate, regular rhythm, and no murmur.   Extremities:  no edema either lower leg Psych:  normally interactive, good eye contact, not anxious appearing, and not depressed appearing.     Impression & Recommendations:  Problem # 1:  URI (ICD-465.9) Assessment Deteriorated  Given duration of symptoms, will treat for bacterial sinusitis with Augmentin. See patient insturctions for details. His updated medication list for this problem includes:    Tylenol Extra Strength 500 Mg Tabs (Acetaminophen) .Marland Kitchen... As needed  Orders: Prescription Created Electronically [phone])  Complete Medication List: 1)  Multivitamins Tabs (Multiple vitamin) .... Take one by mouth daily 2)  Glucosamine Complex Tabs (Nutritional supplements) 3)  Fish Oil 1000 Mg Caps (Omega-3 fatty acids) .... Take two by mouth daily 4)  Saw Palmetto 500 Mg Caps (Saw palmetto (serenoa repens)) .... Take two by mouth daily 5)  Tylenol Extra Strength 500 Mg Tabs (  Acetaminophen) .... As needed 6)  Vitamin E  .... Take one by mouth daily 7)  Vitamin C  .... Take one by mouth daily 8)  Aricept 10 Mg Tabs (Donepezil hcl) .... Take 1 tablet by mouth once a day 9)  Rhinocort Aqua 32 Mcg/act Susp (Budesonide (nasal)) .... 2 sprays each nostril daily as needed 10)  Hycodan Syrup  .Marland Kitchen.. 1 - 2 tsp by mouth at bedtime as needed cough 11)  Augmentin 875-125 Mg Tabs (Amoxicillin-pot clavulanate) .Marland Kitchen.. 1 by mouth 2 times daily x 10 days  Patient Instructions: 1)  Take antibiotic as directed.  Drink lots of fluids.   Treat sympotmatically with Mucinex, nasal saline irrigation, and Tylenol/Ibuprofen. Also try claritin D or zyrtec D over the counter- two times a day as needed ( have to sign for them at pharmacy). You can use warm compresses.  Cough suppressant at night. Call if not improving as expected in 5-7 days.  Prescriptions: AUGMENTIN 875-125 MG TABS (AMOXICILLIN-POT CLAVULANATE) 1 by mouth 2 times daily x 10 days  #20 x 0   Entered and Authorized by:   Ruthe Mannan MD   Signed by:   Ruthe Mannan MD on 04/03/2009   Method used:   Electronically to        [facility]* (retail)       [address]       [place], [place]  [address]       Ph: [phone]       Fax: [phone]   RxID:   [id number]   Current Allergies (reviewed today): ! ERYTHROMYCIN ETHYLSUCCINATE ! IBUPROFEN (IBUPROFEN)

## 2010-03-27 NOTE — Progress Notes (Signed)
Summary: still having hip pain  Phone Note Call from Patient Call back at Home Phone [phone]   Caller: Patient Call For: Ruthe Mannan MD Summary of Call: Pt is still having a lot of pain in his hip.  He is taking ibuprofen and tylenol.  He will give this a few more days and if not better he will call back for referral. Initial call taken by: Lowella Petties CMA, AAMA,  February 03, 2010 3:22 PM  Follow-up for Phone Call        ok thank you. Ruthe Mannan MD  February 03, 2010 3:23 PM

## 2010-03-27 NOTE — Letter (Signed)
Summary: Previsit letter  [facility]  [address] [place], [place] [address]   Phone: [phone]  Fax: [phone]       10/21/2009 MRN: [id number]  [facility] [place] [address] [place], [place]  [address]  Dear [name],  Welcome to the Gastroenterology Division at [facility].    You are scheduled to see a nurse for your pre-procedure visit on 12/02/2009 at 10:30AM on the 3rd floor at [facility], [address]  We ask that you try to arrive at our office 15 minutes prior to your appointment time to allow for check-in.  Your nurse visit will consist of discussing your medical and surgical history, your immediate family medical history, and your medications.    Please bring a complete list of all your medications or, if you prefer, bring the medication bottles and we will list them.  We will need to be aware of both prescribed and over the counter drugs.  We will need to know exact dosage information as well.  If you are on blood thinners (Coumadin, Plavix, Aggrenox, Ticlid, etc.) please call our office today/prior to your appointment, as we need to consult with your physician about holding your medication.   Please be prepared to read and sign documents such as consent forms, a financial agreement, and acknowledgement forms.  If necessary, and with your consent, a friend or relative is welcome to sit-in on the nurse visit with you.  Please bring your insurance card so that we may make a copy of it.  If your insurance requires a referral to see a specialist, please bring your referral form from your primary care physician.  No co-pay is required for this nurse visit.     If you cannot keep your appointment, please call [phone] to cancel or reschedule prior to your appointment date.  This allows [place] the opportunity to schedule an appointment for another patient in need of care.    Thank you for choosing [facility] for your medical  needs.  We appreciate the opportunity to care for you.  Please visit [place] at our website  to learn more about our practice.                     Sincerely.                                                                                                                   The Gastroenterology Division

## 2010-03-27 NOTE — Assessment & Plan Note (Signed)
Summary: 11:30 RUNNY NOSE,COUGH,HA/CLE   Vital Signs:  Patient profile:   70 year old male Height:      69 inches Weight:      209.4 pounds BMI:     31.03 Temp:     97.7 degrees F oral Pulse rate:   72 / minute Pulse rhythm:   regular BP sitting:   130 / 70  (left arm) Cuff size:   large  Vitals Entered By: Benny Lennert CMA Duncan Dull) (March 14, 2009 11:35 AM)  History of Present Illness: Chief complaint runny nose,cough  70 year old male:  Nose is running nonstop, helped a friend cut up a tree. Was cold some outside.   Finished that, and now has a cold. Runny nose. Sneezing coughing.  This 70 Years Old White Male comes in today with complaints of cough, runny nose, and sore throat.  AF.   ROS: no n, v, d, rash, fever. eating and drinking ok  GEN: WDWN, NAD; alert,appropriate and cooperative throughout exam HEENT: Normocephalic and atraumatic. Throat clear, w/o exudate, no LAD, R TM clear, L TM - good landmarks, No fluid present. rhinnorhea.  Left frontal and maxillary sinuses: NT Right frontal and maxillary sinuses: NT NECK: No ant or post LAD CV: RRR, No M/G/R PULM: no resp distress, no accessory muscles.  No retractions. no w/c/r ABD: S,NT,ND,+BS, No HSM EXTR: no c/c/e PSYCH: full affect, pleasant, conversant    Allergies: 1)  ! Erythromycin Ethylsuccinate 2)  ! Ibuprofen (Ibuprofen)  Past History:  Past medical, surgical, family and social histories (including risk factors) reviewed, and no changes noted (except as noted below).  Past Medical History: Reviewed history from 12/26/2008 and no changes required. Gout (03/2002) Colonic polyps, hx of (10/28/2006)  Past Surgical History: Reviewed history from 12/26/2008 and no changes required. Tonsillectomy (1956) Hemorrhoidectomy (1985) Left hernia repair (1998) Spirometry negative (09/2004) MRI Left elbow epicondylitis( 07/2004) CT Chest negative (02/2003), ---7/05 no change--, 1/09--no change CT  Sinuses ENT (04/2001) Colonoscopy polyp (10/28/2006)--6/91--neg Sigmoidoscopy negative (2000)--3/95, 12/2008--moderately severe obstruction TEE--3/09--EF 60-65%, mild aortic regurg, no PAH findings  Family History: Reviewed history and no changes required.  Social History: Reviewed history from 12/26/2008 and no changes required. Marital Status: Married Children: 3 Occupation: retired from teaching--driver's ed   Impression & Recommendations:  Problem # 1:  URI (ICD-465.9) I discussed upper respiratory tract infections with the patient and explained viral infections in general.  Recommended sleep, rest, no tobacco, no alcohol, no other drugs. Symptomatic care with pushing fluids. Symptomatic care with over-the-counter expectorant, such as Mucinex DM or Robitussin-DM, including a cough suppressant. Oral acetaminophen or NSAIDs as tolerated for body aches, chills, fevers.  follow-up if acutely worsens   His updated medication list for this problem includes:    Tylenol Extra Strength 500 Mg Tabs (Acetaminophen) .Marland Kitchen... As needed  Complete Medication List: 1)  Multivitamins Tabs (Multiple vitamin) .... Take one by mouth daily 2)  Glucosamine Complex Tabs (Nutritional supplements) 3)  Fish Oil 1000 Mg Caps (Omega-3 fatty acids) .... Take two by mouth daily 4)  Saw Palmetto 500 Mg Caps (Saw palmetto (serenoa repens)) .... Take two by mouth daily 5)  Tylenol Extra Strength 500 Mg Tabs (Acetaminophen) .... As needed 6)  Vitamin E  .... Take one by mouth daily 7)  Vitamin C  .... Take one by mouth daily 8)  Aricept 10 Mg Tabs (Donepezil hcl) .... Take 1 tablet by mouth once a day 9)  Rhinocort Aqua 32 Mcg/act Susp (Budesonide (  nasal)) .... 2 sprays each nostril daily as needed 10)  Hycodan Syrup  .Marland Kitchen.. 1 - 2 tsp by mouth at bedtime as needed cough  Patient Instructions: 1)  Upper Respiratory Infection 2)  -Viral Infections 3)  TREATMENT 4)  1. Drink plenty of fluids, but limit  caffeine 5)  3. Nasal Sprays: Relieve pressure, promote drainage, open nasal and ear passages. Afrin or Neosynephrine can be used for only 3 days in row. 6)  4. Nasal Saline: Moisten and smooth membranes, no side effects 7)  5. Cough Suppressants: Several types over the counter such as DM. Codeine and Hydrocodon are prescription narcotic medicines that are powerful cough suppressants. 8)  6. Expectorants: Liquify secretions and improve drainage. (ex=Robitussin or Mucinex) Prescriptions: HYCODAN SYRUP 1 - 2 tsp by mouth at bedtime as needed cough  #8 oz x 0   Entered and Authorized by:   Hannah Beat MD   Signed by:   Hannah Beat MD on 03/14/2009   Method used:   Print then Give to Patient   RxID:   [id number]   Current Allergies (reviewed today): ! ERYTHROMYCIN ETHYLSUCCINATE ! IBUPROFEN (IBUPROFEN)

## 2010-06-26 ENCOUNTER — Telehealth: Payer: Self-pay | Admitting: *Deleted

## 2010-06-26 ENCOUNTER — Other Ambulatory Visit: Payer: Self-pay | Admitting: *Deleted

## 2010-06-26 MED ORDER — DONEPEZIL HCL 10 MG PO TABS: ORAL_TABLET | ORAL | Status: DC

## 2010-06-26 MED ORDER — DONEPEZIL HCL 10 MG PO TBDP: ORAL_TABLET | ORAL | Status: DC

## 2010-06-26 NOTE — Telephone Encounter (Signed)
Aricept incorrectly sent in as ODT, should have been plain, called [place] to change, changed in epic.

## 2010-07-08 NOTE — Assessment & Plan Note (Signed)
[facility]                            CARDIOLOGY OFFICE NOTE   NAME:Ricky Boyd, Berkovich                         MRN:          [id number]  DATE:04/11/2007                            DOB:          01-27-1941    Ricky Boyd is a 70-year patient referred by Dr. Hetty Ely and Everrett Coombe  for dyspnea.   The patient has no previous documented coronary artery disease.  He is a  previous smoker.  He apparently has some interstitial fibrosis.   He has not had a previous stress test or echo.   In talking to the patient, he does complain of dyspnea that is chronic.  It is not progressive.  He has been coughing a bit.  He tends to get  sick in the cold months after January.  There is a question of an  environmental insult in regards to him using a wood stove and his wife  smoking at home.  He did have some spirometry I saw in the chart which  did not show particularly bad bronchospastic disease.   He also has a history of right lower lobe lung nodules which have been  stable.   The patient's biggest complaint is chronic sinusitis.  He said this has  been worked up before.  He complains of nasal drainage which may be  related to the wood stove and his wife smoking.  He has pain behind the  eyes, and after he gets enough drainage in the back of his throat, he  tends to cough.  In talking to Kimmie, this seems to be his biggest  problem.  He has not had any exercise-induced chest pain.  His dyspnea  seems functional and related to his pulmonary fibrosis.  He does  occasionally cough.  There is no active sputum or fever at this time.   He quit smoking in '63.   His review of systems is otherwise negative.   His past medical history is remarkable for tonsillectomy,  hemorrhoidectomy, previous tennis elbow repair, previous hernia surgery,  pulmonary fibrosis, sinusitis, previous smoking.   His family history is remarkable for father dying at age 55 of paralytic  ileus   MEDICATIONS:  Include:  1. Aricept 10 a day.  2. Glucosamine.  3. Saw palmetto.  4. Fish oil.  5. Vitamin C.   HE IS ALLERGIC TO ERYTHROMYCIN AND IBUPROFEN.   The patient has three children.  He is married.  He seems to be a bit of  a baseball fan particularly when Alan Mulder was alive.  He does his  activities of daily living.  Does not smoke or drink.   His exam is remarkable for a somewhat chronically ill-appearing white  male in no distress.  He does have a little bit of cyanosis to his lips.  His weight is 205, blood pressure is 120/70, pulse 80 and regular,  respiratory rate 14.  HEENT:  Unremarkable.  Carotids are normal without bruit.  No lymphadenopathy, thyromegaly, JVP  elevation.  LUNGS:  Have interstitial crackles at the base.  No active wheezing.  Diaphragmatic motion  normal.  S1-S2 normal.  Heart sounds PMI normal.  ABDOMEN:  Benign.  Bowel sounds positive.  No AAA.  No tenderness.  No  hepatosplenomegaly.  No hepatojugular reflux.  Distal pulses are intact.  No edema.  NEUROLOGICAL:  Nonfocal.  SKIN:  Warm and dry.  No muscular weakness.   Reviewed records from the [facility].  EKG done there normal.   No evidence of pulmonary hypertension.   IMPRESSION:  1. Dyspnea likely related to his pulmonary fibrosis.  Recommend high-      resolution CT scan yearly.  Will check 2-D echocardiogram to make      sure there is no cor pulmonale and assess LV function with a normal      EKG and normal cardiac exam.  Doubtful that the dyspnea has      anything to do with cardiac dysfunction.  2. Sinusitis.  Refer to ENT.  Check sinus CT.  Again, I took the      liberty of doing this since that was the patient's biggest concern      in regards to his health.  Will leave it up to ENT to further make      recommendations regarding nasal steroids or antibiotics.  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  The patient seemed to be oriented to       place and time.  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  Follow up with Dr. Hetty Ely.  4. Hypertriglyceridemia.  Continue fish oil.  Lipid and liver profile      in 6 months.   Overall, I think that the patient's biggest problem would appear to  revolve around the sinusitis and pulmonary fibrosis so long as his  echocardiogram was normal.  He will follow up with primary care.     Noralyn Pick. Eden Emms, MD, [facility]  Electronically Signed    PCN/MedQ  DD: 04/11/2007  DT: 04/12/2007  Job #: [id number]   cc:   [name] D. Bean, FNP  Arta Silence, MD

## 2011-01-14 ENCOUNTER — Encounter: Payer: Self-pay | Admitting: Internal Medicine

## 2011-01-20 ENCOUNTER — Telehealth: Payer: Self-pay | Admitting: *Deleted

## 2011-01-20 NOTE — Telephone Encounter (Signed)
Pt is asking that order for [facility]avax be sent to [facility],please let him know when sent.

## 2011-01-20 NOTE — Telephone Encounter (Signed)
Zostavax didn't go electronically to pharmacy, so I called it in.

## 2011-01-20 NOTE — Telephone Encounter (Signed)
Patient advised as instructed via telephone. 

## 2011-01-20 NOTE — Telephone Encounter (Signed)
Opened in error

## 2011-01-20 NOTE — Telephone Encounter (Signed)
Called patient several times and line was busy each time, will call again later.

## 2011-01-20 NOTE — Telephone Encounter (Signed)
rx sent

## 2011-01-29 ENCOUNTER — Ambulatory Visit (INDEPENDENT_AMBULATORY_CARE_PROVIDER_SITE_OTHER): Payer: BC Managed Care – PPO | Admitting: Family Medicine

## 2011-01-29 ENCOUNTER — Encounter: Payer: Self-pay | Admitting: Family Medicine

## 2011-01-29 VITALS — BP 102/72 | HR 81 | Temp 98.4°F | Ht 69.0 in | Wt 202.5 lb

## 2011-01-29 DIAGNOSIS — J069 Acute upper respiratory infection, unspecified: Secondary | ICD-10-CM

## 2011-01-29 MED ORDER — HYDROCOD POLST-CHLORPHEN POLST 10-8 MG/5ML PO LQCR: 5.0000 mL | Freq: Two times a day (BID) | ORAL | Status: DC | PRN

## 2011-01-29 MED ORDER — AMOXICILLIN-POT CLAVULANATE 875-125 MG PO TABS: 1.0000 | ORAL_TABLET | Freq: Two times a day (BID) | ORAL | Status: DC

## 2011-01-29 NOTE — Patient Instructions (Signed)
Good to see you. I hope you feel better soon. Take antibiotic as directed.  Drink lots of fluids.  Treat sympotmatically with Mucinex, nasal saline irrigation, and Tylenol/Ibuprofen. Cough suppressant at night. Call if not improving as expected in 5-7 days.

## 2011-01-29 NOTE — Progress Notes (Signed)
SUBJECTIVE:  Ricky Boyd is a 70 y.o. male who complains of coryza, congestion, sneezing, sore throat, dry cough and bilateral sinus pain for 8 days. He denies a history of anorexia, chest pain and shortness of breath and denies a history of asthma. Patient denies smoke cigarettes.   Patient Active Problem List  Diagnoses  . GOUT  . INTERSTITIAL LUNG DISEASE  . LUNG NODULE  . GASTROENTERITIS  . HIP PAIN, RIGHT  . MEMORY LOSS  . COLONIC POLYPS, HX OF  . INGUINAL HERNIORRHAPHY, LEFT, HX OF   No past medical history on file. No past surgical history on file. History  Substance Use Topics  . Smoking status: Never Smoker   . Smokeless tobacco: Not on file  . Alcohol Use: Not on file   No family history on file. Allergies  Allergen Reactions  . Erythromycin Ethylsuccinate     REACTION: irritates eyes  . Ibuprofen    Current Outpatient Prescriptions on File Prior to Visit  Medication Sig Dispense Refill  . donepezil (ARICEPT) 10 MG tablet Take one tablet by mouth once a day.  30 tablet  6   The PMH, PSH, Social History, Family History, Medications, and allergies have been reviewed in [facility], and have been updated if relevant.  OBJECTIVE: BP 102/72  Pulse 81  Temp(Src) 98.4 F (36.9 C) (Oral)  Ht 5\' 9"  (1.753 m)  Wt 202 lb 8 oz (91.853 kg)  BMI 29.90 kg/m2  He appears well but diaphoretic, vital signs are as noted. Ears normal.  Throat and pharynx normal.  Neck supple. No adenopathy in the neck. Nose is congested. Sinuses tender throughout. The chest is clear, without wheezes or rales.  ASSESSMENT:  sinusitis  PLAN: Given duration and progression of symptoms, will treat for bacterial sinusitis with Augmentin. Symptomatic therapy suggested: push fluids, rest and return office visit prn if symptoms persist or worsen.  Call or return to clinic prn if these symptoms worsen or fail to improve as anticipated.

## 2011-01-30 ENCOUNTER — Telehealth: Payer: Self-pay | Admitting: Internal Medicine

## 2011-01-30 MED ORDER — CEFUROXIME AXETIL 250 MG PO TABS: 250.0000 mg | ORAL_TABLET | Freq: Two times a day (BID) | ORAL | Status: DC

## 2011-01-30 NOTE — Telephone Encounter (Signed)
THis is odd since he has taken it before. Please advise him to stop taking it. Send in rx for ceftin 250 mg po twice daily x 5 days.  Number 10 with no refills. Please add Augmentin to allergy list with symptoms.

## 2011-01-30 NOTE — Telephone Encounter (Signed)
Patient called and stated since he stated taking Augmentin he has been waking up not knowing where he is, and having crazy dreams.  Please advise.

## 2011-01-30 NOTE — Telephone Encounter (Signed)
Patient advised as instructed via telephone, Augmentin added to allergy list.  Rx for Ceftin sent to [facility].

## 2011-03-10 ENCOUNTER — Encounter: Payer: Self-pay | Admitting: Internal Medicine

## 2011-03-25 ENCOUNTER — Other Ambulatory Visit: Payer: Self-pay | Admitting: *Deleted

## 2011-03-25 MED ORDER — DONEPEZIL HCL 10 MG PO TABS: ORAL_TABLET | ORAL | Status: DC

## 2011-04-03 ENCOUNTER — Ambulatory Visit (AMBULATORY_SURGERY_CENTER): Payer: BC Managed Care – PPO | Admitting: *Deleted

## 2011-04-03 VITALS — Ht 69.0 in | Wt 207.0 lb

## 2011-04-03 DIAGNOSIS — Z1211 Encounter for screening for malignant neoplasm of colon: Secondary | ICD-10-CM

## 2011-04-03 MED ORDER — PEG-KCL-NACL-NASULF-NA ASC-C 100 G PO SOLR: ORAL | Status: DC

## 2011-04-17 ENCOUNTER — Ambulatory Visit (AMBULATORY_SURGERY_CENTER): Payer: Medicare Other | Admitting: Internal Medicine

## 2011-04-17 ENCOUNTER — Encounter: Payer: Self-pay | Admitting: Internal Medicine

## 2011-04-17 VITALS — BP 118/89 | HR 55 | Temp 98.2°F | Resp 20 | Ht 69.0 in | Wt 207.0 lb

## 2011-04-17 DIAGNOSIS — Z860100 Personal history of colon polyps, unspecified: Secondary | ICD-10-CM

## 2011-04-17 DIAGNOSIS — Z1211 Encounter for screening for malignant neoplasm of colon: Secondary | ICD-10-CM

## 2011-04-17 DIAGNOSIS — K5730 Diverticulosis of large intestine without perforation or abscess without bleeding: Secondary | ICD-10-CM

## 2011-04-17 MED ORDER — SODIUM CHLORIDE 0.9 % IV SOLN: 500.0000 mL | INTRAVENOUS | Status: DC

## 2011-04-17 NOTE — Op Note (Signed)
[facility] [address] [place], [place]  [address]  COLONOSCOPY PROCEDURE REPORT  PATIENT:  Ricky, Boyd  MR#:  [id number] BIRTHDATE:  1940/11/09, 70 yrs. old  GENDER:  male ENDOSCOPIST:  Iva Boop, MD, [facility]  PROCEDURE DATE:  04/17/2011 PROCEDURE:  Colonoscopy 86578 ASA CLASS:  Class II INDICATIONS:  surveillance and high-risk screening, history of pre-cancerous (adenomatous) colon polyps diminutive adenoma in 2008 15 mm sessile TV adenoma 2011- for closer follow-up to ensure removal MEDICATIONS:   These medications were titrated to patient response per physician's verbal order, Fentanyl 50 mcg IV, Versed 5 mg IV  DESCRIPTION OF PROCEDURE:   After the risks benefits and alternatives of the procedure were thoroughly explained, informed consent was obtained.  Digital rectal exam was performed and revealed no abnormalities and normal prostate.   The LB CF-[id number] endoscope was introduced through the anus and advanced to the cecum, which was identified by both the appendix and ileocecal valve, without limitations.  The quality of the prep was excellent, using MoviPrep.  The instrument was then slowly withdrawn as the colon was fully examined. <<PROCEDUREIMAGES>>  FINDINGS:  Severe diverticulosis was found in the left colon. Mild diverticulosis was found in the right colon.  This was otherwise a normal examination of the colon.   Retroflexed views in the rectum revealed no abnormalities.    The time to cecum = 1:59 minutes. The scope was then withdrawn in 10:13 minutes from the cecum and the procedure completed. COMPLICATIONS:  None ENDOSCOPIC IMPRESSION: 1) Severe diverticulosis in the left colon 2) Mild diverticulosis in the right colon 3) Otherwise normal examination 4) Personal history of diminutive adenoma 2008 and 15 mm TV adenoma 2011  REPEAT EXAM:  In 5 year(s) for routine screening colonoscopy if health status appropriate  Iva Boop, MD, [facility]  CC:  The Patient  n. eSIGNED:   Iva Boop at 04/17/2011 01:13 PM  Daleen Squibb, Jeannett Senior, [phone]

## 2011-04-17 NOTE — Progress Notes (Signed)
The pt tolerated the colonoscopy very well. Maw  I relieved Durwin Glaze, RN in the procedure room. Maw

## 2011-04-17 NOTE — Patient Instructions (Addendum)
No polyps were found today - the one removed in 2011 is gone. You still have diverticulosis. Can consider a repeat colonoscopy in 5 years. Iva Boop, MD, FACG  YOU HAD AN ENDOSCOPIC PROCEDURE TODAY AT THE [facility]: Refer to the procedure report that was given to you for any specific questions about what was found during the examination.  If the procedure report does not answer your questions, please call your gastroenterologist to clarify.  If you requested that your care partner not be given the details of your procedure findings, then the procedure report has been included in a sealed envelope for you to review at your convenience later.  YOU SHOULD EXPECT: Some feelings of bloating in the abdomen. Passage of more gas than usual.  Walking can help get rid of the air that was put into your GI tract during the procedure and reduce the bloating. If you had a lower endoscopy (such as a colonoscopy or flexible sigmoidoscopy) you may notice spotting of blood in your stool or on the toilet paper. If you underwent a bowel prep for your procedure, then you may not have a normal bowel movement for a few days.  DIET: Your first meal following the procedure should be a light meal and then it is ok to progress to your normal diet.  A half-sandwich or bowl of soup is an example of a good first meal.  Heavy or fried foods are harder to digest and may make you feel nauseous or bloated.  Likewise meals heavy in dairy and vegetables can cause extra gas to form and this can also increase the bloating.  Drink plenty of fluids but you should avoid alcoholic beverages for 24 hours.  ACTIVITY: Your care partner should take you home directly after the procedure.  You should plan to take it easy, moving slowly for the rest of the day.  You can resume normal activity the day after the procedure however you should NOT DRIVE or use heavy machinery for 24 hours (because of the sedation medicines used during  the test).    SYMPTOMS TO REPORT IMMEDIATELY: A gastroenterologist can be reached at any hour.  During normal business hours, 8:30 AM to 5:00 PM Monday through Friday, call [phone].  After hours and on weekends, please call the GI answering service at [phone] who will take a message and have the physician on call contact you.   Following lower endoscopy (colonoscopy or flexible sigmoidoscopy):  Excessive amounts of blood in the stool  Significant tenderness or worsening of abdominal pains  Swelling of the abdomen that is new, acute  Fever of 100F or higher  Following upper endoscopy (EGD)  Vomiting of blood or coffee ground material  New chest pain or pain under the shoulder blades  Painful or persistently difficult swallowing  New shortness of breath  Fever of 100F or higher  Black, tarry-looking stools  FOLLOW UP: If any biopsies were taken you will be contacted by phone or by letter within the next 1-3 weeks.  Call your gastroenterologist if you have not heard about the biopsies in 3 weeks.  Our staff will call the home number listed on your records the next business day following your procedure to check on you and address any questions or concerns that you may have at that time regarding the information given to you following your procedure. This is a courtesy call and so if there is no answer at the home number and we  have not heard from you through the emergency physician on call, we will assume that you have returned to your regular daily activities without incident.  SIGNATURES/CONFIDENTIALITY: You and/or your care partner have signed paperwork which will be entered into your electronic medical record.  These signatures attest to the fact that that the information above on your After Visit Summary has been reviewed and is understood.  Full responsibility of the confidentiality of this discharge information lies with you and/or your care-partner.   Handout on  diverticulosis and high fiber diet Dr Leone Payor recommends a repeat exam in 5 years-2018-we will mail you a letter to remind you of this

## 2011-04-17 NOTE — Progress Notes (Signed)
Patient did not experience any of the following events: a burn prior to discharge; a fall within the facility; wrong site/side/patient/procedure/implant event; or a hospital transfer or hospital admission upon discharge from the facility. (G8907)Patient did not experience any of the following events: a burn prior to discharge; a fall within the facility; wrong site/side/patient/procedure/implant event; or a hospital transfer or hospital admission upon discharge from the facility. (G8907)Patient did not have preoperative order for IV antibiotic SSI prophylaxis. (G8918) 

## 2011-04-20 ENCOUNTER — Telehealth: Payer: Self-pay | Admitting: *Deleted

## 2011-04-20 NOTE — Telephone Encounter (Signed)
  Follow up Call-  Call back number 04/17/2011  Post procedure Call Back phone  # [phone]  Permission to leave phone message Yes     Patient questions:  Do you have a fever, pain , or abdominal swelling? no Pain Score  0 *  Have you tolerated food without any problems? yes  Have you been able to return to your normal activities? yes  Do you have any questions about your discharge instructions: Diet   no Medications  no Follow up visit  no  Do you have questions or concerns about your Care? no  Actions: * If pain score is 4 or above: No action needed, pain <4.

## 2011-07-31 ENCOUNTER — Encounter: Payer: Self-pay | Admitting: Family Medicine

## 2011-07-31 ENCOUNTER — Ambulatory Visit (INDEPENDENT_AMBULATORY_CARE_PROVIDER_SITE_OTHER): Payer: Medicare Other | Admitting: Family Medicine

## 2011-07-31 ENCOUNTER — Telehealth: Payer: Self-pay

## 2011-07-31 VITALS — BP 110/60 | HR 68 | Temp 99.3°F | Wt 199.0 lb

## 2011-07-31 DIAGNOSIS — J329 Chronic sinusitis, unspecified: Secondary | ICD-10-CM | POA: Insufficient documentation

## 2011-07-31 MED ORDER — AZITHROMYCIN 250 MG PO TABS: ORAL_TABLET | ORAL | Status: AC

## 2011-07-31 NOTE — Progress Notes (Signed)
SUBJECTIVE:  Ricky Boyd is a 71 y.o. male who complains of coryza, congestion, sneezing, sore throat, dry cough, myalgias and bilateral sinus pain for 8 days. He denies a history of anorexia and chest pain and denies a history of asthma. Patient denies smoke cigarettes.   Patient Active Problem List  Diagnoses  . GOUT  . INTERSTITIAL LUNG DISEASE  . LUNG NODULE  . MEMORY LOSS  . COLONIC POLYPS, HX OF - ADENOMAS  . Sinusitis   Past Medical History  Diagnosis Date  . Hearing aid worn   . Memory deficit   . Arthritis    Past Surgical History  Procedure Date  . Hemorroidectomy   . Tonsillectomy and adenoidectomy   . Colonoscopy    History  Substance Use Topics  . Smoking status: Former Games developer  . Smokeless tobacco: Former Neurosurgeon  . Alcohol Use: 9.6 oz/week    16 Cans of beer per week     fifth of vodka a week   Family History  Problem Relation Age of Onset  . Colon cancer Neg Hx    Allergies  Allergen Reactions  . Ibuprofen Other (See Comments)    Confusion on 800mg   Tolerates smaller doses  . Amoxicillin-Pot Clavulanate Other (See Comments)    Bad Dreams  . Erythromycin Ethylsuccinate     REACTION: irritates eyes   Current Outpatient Prescriptions on File Prior to Visit  Medication Sig Dispense Refill  . Ascorbic Acid (VITAMIN C) 500 MG CAPS Take 1 capsule by mouth daily.      Marland Kitchen donepezil (ARICEPT) 10 MG tablet Take one tablet by mouth once a day.  30 tablet  6  . fish oil-omega-3 fatty acids 1000 MG capsule Take 1 g by mouth 2 (two) times daily.      Marland Kitchen glucosamine-chondroitin 500-400 MG tablet Take 1 tablet by mouth 2 (two) times daily.      . Multiple Vitamins-Minerals (MULTIVITAMIN WITH MINERALS) tablet Take 1 tablet by mouth daily.      . saw palmetto 160 MG capsule Take 160 mg by mouth 2 (two) times daily.       The PMH, PSH, Social History, Family History, Medications, and allergies have been reviewed in [facility], and have been updated if relevant.  OBJECTIVE: BP  110/60  Pulse 68  Temp(Src) 99.3 F (37.4 C) (Oral)  Wt 199 lb (90.266 kg)  He appears well, vital signs are as noted. Ears normal.  Throat and pharynx normal.  Neck supple. No adenopathy in the neck. Nose is congested. Sinuses non tender. The chest is clear, without wheezes or rales.  ASSESSMENT:  upper respiratory illness  PLAN: Given duration and progression of symptoms, will treat for bacterial sinusitis with Zpack (PCN allergic). Symptomatic therapy suggested: push fluids, rest and return office visit prn if symptoms persist or worsen. Call or return to clinic prn if these symptoms worsen or fail to improve as anticipated.

## 2011-07-31 NOTE — Telephone Encounter (Signed)
Advised pt, he wants to be seen.  He was told to come in now.

## 2011-07-31 NOTE — Telephone Encounter (Signed)
Normally, pt would need to be seen.  If he cannot come in now to see me, ok to send Zpack to [place], no refills.

## 2011-07-31 NOTE — Telephone Encounter (Signed)
For 1 week, h/a at forehead and temple area, head congestion, when blows nose green & red mucus, yesterday after showering was drying lt ear and saw bright red blood on end of Q tip. No blood since then and no earache. Nose is tender to touch. No S/T,no fever. Pt started using netty pot on 07/30/11. [facility].Please advise.

## 2011-08-14 ENCOUNTER — Telehealth: Payer: Self-pay

## 2011-08-14 MED ORDER — TADALAFIL 10 MG PO TABS: 10.0000 mg | ORAL_TABLET | Freq: Every day | ORAL | Status: DC | PRN

## 2011-08-14 NOTE — Telephone Encounter (Signed)
One sample box of # 30 given.  Advised pt to take two a day.  Lot number [id number], exp 01/2014.

## 2011-08-14 NOTE — Telephone Encounter (Signed)
Yes ok to give him samples.  I just added it to his med list.

## 2011-08-14 NOTE — Telephone Encounter (Signed)
Pt request Cialis samples, not on med list; pt said discussed with Dr Dayton Martes 07/31/11 .Please advise.

## 2011-09-30 ENCOUNTER — Other Ambulatory Visit: Payer: Self-pay | Admitting: Orthopaedic Surgery

## 2011-09-30 DIAGNOSIS — M549 Dorsalgia, unspecified: Secondary | ICD-10-CM

## 2011-10-01 ENCOUNTER — Ambulatory Visit
Admission: RE | Admit: 2011-10-01 | Discharge: 2011-10-01 | Disposition: A | Discharge status: Home / Self Care | Payer: Medicare Other | Source: Ambulatory Visit | Attending: Orthopaedic Surgery | Admitting: Orthopaedic Surgery

## 2011-10-01 VITALS — BP 166/92 | HR 65

## 2011-10-01 DIAGNOSIS — M549 Dorsalgia, unspecified: Secondary | ICD-10-CM

## 2011-10-01 DIAGNOSIS — M109 Gout, unspecified: Secondary | ICD-10-CM

## 2011-10-01 MED ORDER — METHYLPREDNISOLONE ACETATE 40 MG/ML INJ SUSP (RADIOLOG
120.0000 mg | Freq: Once | INTRAMUSCULAR | Status: AC
  Administered 2011-10-01: 120 mg via EPIDURAL

## 2011-10-01 MED ORDER — IOHEXOL 180 MG/ML  SOLN
1.0000 mL | Freq: Once | INTRAMUSCULAR | Status: AC | PRN
  Administered 2011-10-01: 1 mL via EPIDURAL

## 2011-11-04 ENCOUNTER — Other Ambulatory Visit: Payer: Self-pay | Admitting: Neurological Surgery

## 2011-11-04 DIAGNOSIS — M47817 Spondylosis without myelopathy or radiculopathy, lumbosacral region: Secondary | ICD-10-CM

## 2011-11-08 ENCOUNTER — Ambulatory Visit
Admission: RE | Admit: 2011-11-08 | Discharge: 2011-11-08 | Disposition: A | Discharge status: Home / Self Care | Payer: Medicare Other | Source: Ambulatory Visit | Attending: Neurological Surgery | Admitting: Neurological Surgery

## 2011-11-08 DIAGNOSIS — M47817 Spondylosis without myelopathy or radiculopathy, lumbosacral region: Secondary | ICD-10-CM

## 2012-01-06 ENCOUNTER — Other Ambulatory Visit: Payer: Self-pay | Admitting: *Deleted

## 2012-01-06 MED ORDER — DONEPEZIL HCL 10 MG PO TABS: ORAL_TABLET | ORAL | Status: DC

## 2012-01-28 ENCOUNTER — Ambulatory Visit (INDEPENDENT_AMBULATORY_CARE_PROVIDER_SITE_OTHER): Payer: Medicare Other | Admitting: Family Medicine

## 2012-01-28 ENCOUNTER — Encounter: Payer: Self-pay | Admitting: Family Medicine

## 2012-01-28 VITALS — BP 118/72 | HR 72 | Temp 97.8°F | Wt 204.0 lb

## 2012-01-28 DIAGNOSIS — N529 Male erectile dysfunction, unspecified: Secondary | ICD-10-CM

## 2012-01-28 DIAGNOSIS — Z0000 Encounter for general adult medical examination without abnormal findings: Secondary | ICD-10-CM

## 2012-01-28 DIAGNOSIS — Z136 Encounter for screening for cardiovascular disorders: Secondary | ICD-10-CM

## 2012-01-28 DIAGNOSIS — Z1331 Encounter for screening for depression: Secondary | ICD-10-CM

## 2012-01-28 DIAGNOSIS — R413 Other amnesia: Secondary | ICD-10-CM

## 2012-01-28 MED ORDER — DONEPEZIL HCL 10 MG PO TABS: ORAL_TABLET | ORAL | Status: DC

## 2012-01-28 MED ORDER — TADALAFIL 10 MG PO TABS: 10.0000 mg | ORAL_TABLET | Freq: Every day | ORAL | Status: DC | PRN

## 2012-01-28 NOTE — Patient Instructions (Addendum)
Good to see you. Have a wonderful Christmas.  We will call you with your lab results.

## 2012-01-28 NOTE — Progress Notes (Signed)
Subjective:    Patient ID: Ricky Boyd, male    DOB: 09-May-1940, 71 y.o.   MRN: [id number]  HPI  Very pleasant 71 yo male here for follow up.  Still in a relationship with a woman since his wife committed suicide.  He is adjusting well and is happy in his new relationship. He would like refills on his cialis. Denies any HA, blurred vision, CP or SOB.  Memory loss- placed on Aricept years ago by [employer].  He does have a family h/o alzheimer's.  He feels he does still forget where he places things but has never forgotten people he knew or where he lived. He would like to continue taking it because he has had no side effects.  Patient Active Problem List  Diagnosis  . GOUT  . INTERSTITIAL LUNG DISEASE  . LUNG NODULE  . MEMORY LOSS  . COLONIC POLYPS, HX OF - ADENOMAS  . Sinusitis  . Impotence   Past Medical History  Diagnosis Date  . Hearing aid worn   . Memory deficit   . Arthritis    Past Surgical History  Procedure Date  . Hemorroidectomy   . Tonsillectomy and adenoidectomy   . Colonoscopy    History  Substance Use Topics  . Smoking status: Former Games developer  . Smokeless tobacco: Former Neurosurgeon  . Alcohol Use: 9.6 oz/week    16 Cans of beer per week     Comment: fifth of vodka a week   Family History  Problem Relation Age of Onset  . Colon cancer Neg Hx    Allergies  Allergen Reactions  . Ibuprofen Other (See Comments)    Confusion on 800mg   Tolerates smaller doses  . Amoxicillin-Pot Clavulanate Other (See Comments)    Bad Dreams  . Erythromycin Ethylsuccinate Other (See Comments)     irritates eyes   Current Outpatient Prescriptions on File Prior to Visit  Medication Sig Dispense Refill  . Ascorbic Acid (VITAMIN C) 500 MG CAPS Take 1 capsule by mouth daily.      . fish oil-omega-3 fatty acids 1000 MG capsule Take 1 g by mouth 2 (two) times daily.      Marland Kitchen glucosamine-chondroitin 500-400 MG tablet Take 1 tablet by mouth 2 (two) times daily.      . Multiple  Vitamins-Minerals (MULTIVITAMIN WITH MINERALS) tablet Take 1 tablet by mouth daily.      . saw palmetto 160 MG capsule Take 160 mg by mouth 2 (two) times daily.      . [DISCONTINUED] donepezil (ARICEPT) 10 MG tablet Take one tablet by mouth once a day.  30 tablet  0  . [DISCONTINUED] tadalafil (CIALIS) 10 MG tablet Take 1 tablet (10 mg total) by mouth daily as needed for erectile dysfunction.  10 tablet  0   The PMH, PSH, Social History, Family History, Medications, and allergies have been reviewed in [facility], and have been updated if relevant.    Review of Systems See HPI No depression No anxiety    Objective:   Physical Exam BP 118/72  Pulse 72  Temp 97.8 F (36.6 C)  Wt 204 lb (92.534 kg) General:  pleasant male in NAD Eyes:  PERRL Ears:  External ear exam shows no significant lesions or deformities.  Otoscopic examination reveals clear canals, tympanic membranes are intact bilaterally without bulging, retraction, inflammation or discharge. Hearing is grossly normal bilaterally. Nose:  External nasal examination shows no deformity or inflammation. Nasal mucosa are pink and moist without lesions or  exudates. Mouth:  Oral mucosa and oropharynx without lesions or exudates.  Teeth in good repair. Neck:  no carotid bruit or thyromegaly no cervical or supraclavicular lymphadenopathy  Lungs:  Normal respiratory effort, chest expands symmetrically. Lungs are clear to auscultation, no crackles or wheezes. Heart:  Normal rate and regular rhythm. S1 and S2 normal without gallop, murmur, click, rub or other extra sounds. Pulses:  R and L posterior tibial pulses are full and equal bilaterally  Extremities:  no edema      Assessment & Plan:   1. Screening for ischemic heart disease  Lipid Panel  2. Impotence  Improved with Cilalis- refilled today and samples given.   3. Memory loss  ?early dementia although symptoms are minimal.  Will continue aricept as he feels it is helping but I did urge  him to perhaps do a trial period without it.  He will think about it.

## 2012-02-01 ENCOUNTER — Other Ambulatory Visit (INDEPENDENT_AMBULATORY_CARE_PROVIDER_SITE_OTHER): Payer: Medicare Other

## 2012-02-01 LAB — COMPREHENSIVE METABOLIC PANEL
ALT: 27 U/L (ref 0–53)
CO2: 31 mEq/L (ref 19–32)
Calcium: 8.7 mg/dL (ref 8.4–10.5)
Chloride: 103 mEq/L (ref 96–112)
Creatinine, Ser: 1.2 mg/dL (ref 0.4–1.5)
GFR: 62.34 mL/min (ref >60.00)
Glucose, Bld: 110 mg/dL — ABNORMAL HIGH (ref 70–99)
Sodium: 139 mEq/L (ref 135–145)
Total Bilirubin: 0.5 mg/dL (ref 0.3–1.2)
Total Protein: 7 g/dL (ref 6.0–8.3)

## 2012-02-01 LAB — LIPID PANEL: Cholesterol: 178 mg/dL (ref 0–200)

## 2012-02-02 ENCOUNTER — Encounter: Payer: Self-pay | Admitting: *Deleted

## 2012-02-22 ENCOUNTER — Encounter: Payer: Self-pay | Admitting: Family Medicine

## 2012-02-22 ENCOUNTER — Ambulatory Visit (INDEPENDENT_AMBULATORY_CARE_PROVIDER_SITE_OTHER): Payer: Medicare Other | Admitting: Family Medicine

## 2012-02-22 VITALS — BP 110/80 | HR 72 | Temp 97.9°F | Wt 202.0 lb

## 2012-02-22 DIAGNOSIS — J329 Chronic sinusitis, unspecified: Secondary | ICD-10-CM

## 2012-02-22 DIAGNOSIS — M25559 Pain in unspecified hip: Secondary | ICD-10-CM

## 2012-02-22 MED ORDER — DEXAMETHASONE SOD PHOSPHATE PF 10 MG/ML IJ SOLN
10.0000 mg | Freq: Once | INTRAMUSCULAR | Status: AC
  Administered 2012-02-22: 10 mg via INTRAMUSCULAR

## 2012-02-22 MED ORDER — CEFUROXIME AXETIL 250 MG PO TABS: 250.0000 mg | ORAL_TABLET | Freq: Two times a day (BID) | ORAL | Status: AC

## 2012-02-22 NOTE — Addendum Note (Signed)
Addended by: Eliezer Bottom on: 02/22/2012 11:45 AM   Modules accepted: Orders

## 2012-02-22 NOTE — Progress Notes (Signed)
Subjective:    Patient ID: Ricky Boyd, male    DOB: [birth year], 71 y.o.   MRN: [id number]  HPI  Very pleasant 71 yo male here for:  1.  URI symptoms- started approximately 2 weeks ago with runny nose, cough, congestion.  No CP, SOB or fever. Feels that his Netty pot typically helps but last few days "can't get anything through with Netty pot."  Sinus pressure worsening, right >left.  Has been coughing which he thinks has worsened his second complain.  2.  Right hip pain that radiates down right lateral leg to foot. H/o sciatica in past.  While coughing last week, pain flared up.  Pain worsened by standing from seated position. No LE weakness. No urinary incontinence.  Patient Active Problem List  Diagnosis  . GOUT  . INTERSTITIAL LUNG DISEASE  . LUNG NODULE  . MEMORY LOSS  . COLONIC POLYPS, HX OF - ADENOMAS  . Sinusitis  . Impotence   Past Medical History  Diagnosis Date  . Hearing aid worn   . Memory deficit   . Arthritis    Past Surgical History  Procedure Date  . Hemorroidectomy   . Tonsillectomy and adenoidectomy   . Colonoscopy    History  Substance Use Topics  . Smoking status: Former Games developer  . Smokeless tobacco: Former Neurosurgeon  . Alcohol Use: 9.6 oz/week    16 Cans of beer per week     Comment: fifth of vodka a week   Family History  Problem Relation Age of Onset  . Colon cancer Neg Hx    Allergies  Allergen Reactions  . Ibuprofen Other (See Comments)    Confusion on 800mg   Tolerates smaller doses  . Amoxicillin-Pot Clavulanate Other (See Comments)    Bad Dreams  . Erythromycin Ethylsuccinate Other (See Comments)     irritates eyes   Current Outpatient Prescriptions on File Prior to Visit  Medication Sig Dispense Refill  . Ascorbic Acid (VITAMIN C) 500 MG CAPS Take 1 capsule by mouth daily.      Marland Kitchen donepezil (ARICEPT) 10 MG tablet Take one tablet by mouth once a day.  30 tablet  3  . fish oil-omega-3 fatty acids 1000 MG capsule Take 1 g by mouth 2  (two) times daily.      Marland Kitchen glucosamine-chondroitin 500-400 MG tablet Take 1 tablet by mouth 2 (two) times daily.      . Multiple Vitamins-Minerals (MULTIVITAMIN WITH MINERALS) tablet Take 1 tablet by mouth daily.      . saw palmetto 160 MG capsule Take 160 mg by mouth 2 (two) times daily.      . tadalafil (CIALIS) 10 MG tablet Take 1 tablet (10 mg total) by mouth daily as needed.  10 tablet  0   The PMH, PSH, Social History, Family History, Medications, and allergies have been reviewed in [facility], and have been updated if relevant.   Review of Systems See HPI    Objective:   Physical Exam BP 110/80  Pulse 72  Temp 97.9 F (36.6 C)  Wt 202 lb (91.627 kg)   He appears well, vital signs are as noted. Ears normal.  Throat and pharynx normal.  Neck supple. No adenopathy in the neck. Nose is congested. Sinuses  tender. The chest is clear, without wheezes or rales. No tenderness over entire spine, SLR pos right, neg fabers     Assessment & Plan:   1. Sinusitis  Given duration and progression of symptoms, will treat for bacterial  sinusitis. (intolerant to Augmentin). Supportive care as documented in AVS.    2. HIP PAIN, RIGHT  New- seems most consistent with sciatica irritated by coughing spells. IM decadron given in office to help with inflammation.  Pt to call next week with an update.

## 2012-02-22 NOTE — Patient Instructions (Signed)
Good to see you. Have a Happy New Year!  Take antibiotic as directed- Ceftin 250 mg twice daily x 10 days.  Drink lots of fluids.  TCall if not improving as expected in 5-7 days.

## 2012-03-11 ENCOUNTER — Ambulatory Visit (INDEPENDENT_AMBULATORY_CARE_PROVIDER_SITE_OTHER): Payer: Medicare Other | Admitting: Family Medicine

## 2012-03-11 ENCOUNTER — Encounter: Payer: Self-pay | Admitting: Family Medicine

## 2012-03-11 VITALS — BP 112/70 | HR 84 | Temp 98.1°F | Wt 201.8 lb

## 2012-03-11 DIAGNOSIS — J329 Chronic sinusitis, unspecified: Secondary | ICD-10-CM

## 2012-03-11 NOTE — Assessment & Plan Note (Signed)
Anticipate viral sinusitis. Supportive care as per instructions. Red flags to return discussed.

## 2012-03-11 NOTE — Patient Instructions (Signed)
You have a sinus infection. Push fluids and plenty of rest. Nasal saline irrigation or neti pot to help drain sinuses. May use simple mucinex or immediate release guaifenesin with plenty of fluid to help mobilize mucous. Let us know if fever >101.5, trouble opening/closing mouth, difficulty swallowing, or worsening - you may need to be seen again. If fever over 101 or worsening productive cough or prolonged symptoms past 10 days, please call us for update

## 2012-03-11 NOTE — Progress Notes (Signed)
  Subjective:    Patient ID: Ricky Boyd, male    DOB: [birth year], 72 y.o.   MRN: [id number]  HPI CC: sinus infection?  Seen here 02/22/2012 with dx sinusitis, treated with ceftin for 10 days which improved sxs completely.  At that time had more coughing.  Now over last 2-3 days having worsening nasal congestion.  Blowing nose caused nosebleed that quickly resolved.  Mild cough, HA, minimal ST, PNdrainage, constantly clearing throat.  So far has tried cough drops.  No fevers/chills, abd pain, nausea, ear or tooth pain.  Planning on singing in quartet at church.  No sick contacts at home. No smokers at home. No h/o asthma/COPD  Past Medical History  Diagnosis Date  . Hearing aid worn   . Memory deficit   . Arthritis      Review of Systems per HPI    Objective:   Physical Exam  Nursing note and vitals reviewed. Constitutional: He appears well-developed and well-nourished. No distress.  HENT:  Head: Normocephalic and atraumatic.  Right Ear: Tympanic membrane, external ear and ear canal normal. Decreased hearing is noted.  Left Ear: Tympanic membrane, external ear and ear canal normal. Decreased hearing is noted.  Nose: Mucosal edema present. No rhinorrhea. Right sinus exhibits no maxillary sinus tenderness and no frontal sinus tenderness. Left sinus exhibits no maxillary sinus tenderness and no frontal sinus tenderness.  Mouth/Throat: Uvula is midline, oropharynx is clear and moist and mucous membranes are normal. No oropharyngeal exudate, posterior oropharyngeal edema, posterior oropharyngeal erythema or tonsillar abscesses.       Nasal congestion and mucosal erythema/irritation  Eyes: Conjunctivae normal and EOM are normal. Pupils are equal, round, and reactive to light. No scleral icterus.  Neck: Normal range of motion. Neck supple.  Cardiovascular: Normal rate, regular rhythm, normal heart sounds and intact distal pulses.   No murmur heard. Pulmonary/Chest: Effort normal  and breath sounds normal. No respiratory distress. He has no wheezes. He has no rales.       Mild bibasilar crackles  Lymphadenopathy:    He has no cervical adenopathy.  Skin: Skin is warm and dry. No rash noted.       Assessment & Plan:

## 2012-03-16 ENCOUNTER — Encounter: Payer: Self-pay | Admitting: Family Medicine

## 2012-03-16 ENCOUNTER — Ambulatory Visit (INDEPENDENT_AMBULATORY_CARE_PROVIDER_SITE_OTHER): Payer: Medicare Other | Admitting: Family Medicine

## 2012-03-16 VITALS — BP 128/82 | HR 76 | Temp 97.8°F | Wt 205.0 lb

## 2012-03-16 DIAGNOSIS — J329 Chronic sinusitis, unspecified: Secondary | ICD-10-CM

## 2012-03-16 MED ORDER — AZITHROMYCIN 250 MG PO TABS: ORAL_TABLET | ORAL | Status: DC

## 2012-03-16 NOTE — Progress Notes (Signed)
SUBJECTIVE:  Ricky Boyd is a 72 y.o. male who complains of coryza, congestion and bilateral sinus pain for 14 days. He denies a history of anorexia and chest pain and denies a history of asthma. Patient denies smoke cigarettes.   Patient Active Problem List  Diagnosis  . GOUT  . INTERSTITIAL LUNG DISEASE  . LUNG NODULE  . MEMORY LOSS  . COLONIC POLYPS, HX OF - ADENOMAS  . Sinusitis  . Impotence   Past Medical History  Diagnosis Date  . Hearing aid worn   . Memory deficit   . Arthritis    Past Surgical History  Procedure Date  . Hemorroidectomy   . Tonsillectomy and adenoidectomy   . Colonoscopy    History  Substance Use Topics  . Smoking status: Former Games developer  . Smokeless tobacco: Former Neurosurgeon  . Alcohol Use: 9.6 oz/week    16 Cans of beer per week     Comment: vodka   Family History  Problem Relation Age of Onset  . Colon cancer Neg Hx    Allergies  Allergen Reactions  . Ibuprofen Other (See Comments)    Confusion on 800mg   Tolerates smaller doses  . Amoxicillin-Pot Clavulanate Other (See Comments)    Bad Dreams  . Erythromycin Ethylsuccinate Other (See Comments)     irritates eyes   Current Outpatient Prescriptions on File Prior to Visit  Medication Sig Dispense Refill  . Ascorbic Acid (VITAMIN C) 500 MG CAPS Take 1 capsule by mouth daily.      Marland Kitchen donepezil (ARICEPT) 10 MG tablet Take one tablet by mouth once a day.  30 tablet  3  . fish oil-omega-3 fatty acids 1000 MG capsule Take 1 g by mouth 2 (two) times daily.      Marland Kitchen glucosamine-chondroitin 500-400 MG tablet Take 1 tablet by mouth 2 (two) times daily.      . Multiple Vitamins-Minerals (MULTIVITAMIN WITH MINERALS) tablet Take 1 tablet by mouth daily.      . saw palmetto 160 MG capsule Take 160 mg by mouth 2 (two) times daily.      . tadalafil (CIALIS) 10 MG tablet Take 1 tablet (10 mg total) by mouth daily as needed.  10 tablet  0   The PMH, PSH, Social History, Family History, Medications, and allergies  have been reviewed in [facility], and have been updated if relevant.  OBJECTIVE: BP 128/82  Pulse 76  Temp 97.8 F (36.6 C)  Wt 205 lb (92.987 kg)  He appears well, vital signs are as noted. Ears normal.  Throat and pharynx normal.  Neck supple. No adenopathy in the neck. Nose is congested. Sinuses non tender. The chest is clear, without wheezes or rales.  ASSESSMENT:  sinusitis  PLAN: Given duration and progression of symptoms, will treat for bacterial sinusitis with Zpack.  Symptomatic therapy suggested: push fluids, rest and return office visit prn if symptoms persist or worsen.  Call or return to clinic prn if these symptoms worsen or fail to improve as anticipated.

## 2012-03-16 NOTE — Patient Instructions (Addendum)
Good to see you.  Take Zpack as directed.  Drink lots of fluids.  Call if not improving as expected in 5-7 days.

## 2012-06-20 ENCOUNTER — Other Ambulatory Visit: Payer: Self-pay | Admitting: Family Medicine

## 2012-06-20 DIAGNOSIS — Z136 Encounter for screening for cardiovascular disorders: Secondary | ICD-10-CM

## 2012-06-20 DIAGNOSIS — Z125 Encounter for screening for malignant neoplasm of prostate: Secondary | ICD-10-CM

## 2012-06-20 DIAGNOSIS — Z0000 Encounter for general adult medical examination without abnormal findings: Secondary | ICD-10-CM

## 2012-06-22 ENCOUNTER — Other Ambulatory Visit (INDEPENDENT_AMBULATORY_CARE_PROVIDER_SITE_OTHER): Payer: Medicare Other

## 2012-06-22 DIAGNOSIS — Z136 Encounter for screening for cardiovascular disorders: Secondary | ICD-10-CM

## 2012-06-22 DIAGNOSIS — R972 Elevated prostate specific antigen [PSA]: Secondary | ICD-10-CM

## 2012-06-22 DIAGNOSIS — Z0000 Encounter for general adult medical examination without abnormal findings: Secondary | ICD-10-CM

## 2012-06-22 DIAGNOSIS — Z125 Encounter for screening for malignant neoplasm of prostate: Secondary | ICD-10-CM

## 2012-06-22 LAB — COMPREHENSIVE METABOLIC PANEL
Albumin: 3.8 g/dL (ref 3.5–5.2)
BUN: 19 mg/dL (ref 6–23)
CO2: 32 mEq/L (ref 19–32)
Calcium: 8.8 mg/dL (ref 8.4–10.5)
Chloride: 108 mEq/L (ref 96–112)
GFR: 60.54 mL/min (ref >60.00)
Glucose, Bld: 105 mg/dL — ABNORMAL HIGH (ref 70–99)
Potassium: 4.5 mEq/L (ref 3.5–5.1)
Sodium: 142 mEq/L (ref 135–145)
Total Protein: 6.7 g/dL (ref 6.0–8.3)

## 2012-06-22 LAB — PSA, MEDICARE: PSA: 4.89 ng/ml — ABNORMAL HIGH (ref 0.10–4.00)

## 2012-06-22 LAB — LIPID PANEL
Cholesterol: 179 mg/dL (ref 0–200)
VLDL: 15.2 mg/dL (ref 0.0–40.0)

## 2012-06-23 ENCOUNTER — Other Ambulatory Visit: Payer: Medicare Other

## 2012-06-27 ENCOUNTER — Encounter: Payer: Self-pay | Admitting: Family Medicine

## 2012-06-27 ENCOUNTER — Ambulatory Visit (INDEPENDENT_AMBULATORY_CARE_PROVIDER_SITE_OTHER): Payer: Medicare Other | Admitting: Family Medicine

## 2012-06-27 VITALS — BP 130/78 | HR 64 | Temp 97.8°F | Ht 68.5 in | Wt 200.0 lb

## 2012-06-27 DIAGNOSIS — Z1331 Encounter for screening for depression: Secondary | ICD-10-CM

## 2012-06-27 DIAGNOSIS — Z0000 Encounter for general adult medical examination without abnormal findings: Secondary | ICD-10-CM

## 2012-06-27 DIAGNOSIS — R972 Elevated prostate specific antigen [PSA]: Secondary | ICD-10-CM | POA: Insufficient documentation

## 2012-06-27 DIAGNOSIS — N529 Male erectile dysfunction, unspecified: Secondary | ICD-10-CM

## 2012-06-27 DIAGNOSIS — Z86010 Personal history of colonic polyps: Secondary | ICD-10-CM

## 2012-06-27 NOTE — Progress Notes (Signed)
Subjective:    Patient ID: Ricky Boyd, male    DOB: November 10, 1940, 72 y.o.   MRN: [id number]  HPI  Very pleasant 72 yo male here for annual medicare wellness visit.  I have personally reviewed the Medicare Annual Wellness questionnaire and have noted 1. The patient's medical and social history 2. Their use of alcohol, tobacco or illicit drugs 3. Their current medications and supplements 4. The patient's functional ability including ADL's, fall risks, home safety risks and hearing or visual             impairment. 5. Diet and physical activities 6. Evidence for depression or mood disorders  End of life wishes discussed and updated in Social History.   Memory loss- placed on Aricept years ago by [employer].  He does have a family h/o alzheimer's.  He feels he does still forget where he places things but has never forgotten people he knew or where he lived. He would like to continue taking it because he has had no side effects.  Lab Results  Component Value Date   CHOL 179 06/22/2012   HDL 44.40 06/22/2012   LDLCALC 119* 06/22/2012   TRIG 76.0 06/22/2012   CHOLHDL 4 06/22/2012   Increased PSA- not having any increased difficulty starting or stopping stream.  No dysuria.  No family h/o prostate CA. Lab Results  Component Value Date   PSA 4.89* 06/22/2012   PSA 2.55 12/31/2008   PSA 1.86 02/23/2005    Patient Active Problem List   Diagnosis Date Noted  . Routine general medical examination at a health care facility 06/20/2012  . Impotence 01/28/2012  . GOUT 12/26/2008  . MEMORY LOSS 12/26/2008  . 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 Hearing aid worn   . Memory deficit   . Arthritis    Past Surgical History  Procedure Laterality Date  . Hemorroidectomy    . Tonsillectomy and adenoidectomy    . Colonoscopy     History  Substance Use Topics  . Smoking  status: Former Games developer  . Smokeless tobacco: Former Neurosurgeon  . Alcohol Use: 9.6 oz/week    16 Cans of beer per week     Comment: vodka   Family History  Problem Relation Age of Onset  . Colon cancer Neg Hx    Allergies  Allergen Reactions  . Ibuprofen Other (See Comments)    Confusion on 800mg   Tolerates smaller doses  . Amoxicillin-Pot Clavulanate Other (See Comments)    Bad Dreams  . Erythromycin Ethylsuccinate Other (See Comments)     irritates eyes   Current Outpatient Prescriptions on File Prior to Visit  Medication Sig Dispense Refill  . Ascorbic Acid (VITAMIN C) 500 MG CAPS Take 1 capsule by mouth daily.      Marland Kitchen azithromycin (ZITHROMAX) 250 MG tablet 2 tabs by mouth on day 1 followed by 1 tab by mouth daily days 2-5  6 each  0  . donepezil (ARICEPT) 10 MG tablet Take one tablet by mouth once a day.  30 tablet  3  . fish oil-omega-3 fatty acids 1000 MG capsule Take 1 g by mouth 2 (two) times daily.      Marland Kitchen glucosamine-chondroitin 500-400 MG tablet Take 1 tablet by mouth 2 (two) times daily.      . Multiple Vitamins-Minerals (MULTIVITAMIN WITH MINERALS) tablet Take 1 tablet by mouth daily.      Marland Kitchen  saw palmetto 160 MG capsule Take 160 mg by mouth 2 (two) times daily.      . tadalafil (CIALIS) 10 MG tablet Take 1 tablet (10 mg total) by mouth daily as needed.  10 tablet  0   No current facility-administered medications on file prior to visit.   The PMH, PSH, Social History, Family History, Medications, and allergies have been reviewed in [facility], and have been updated if relevant.    Review of Systems See HPI Patient reports no  vision/ hearing changes,anorexia, weight change, fever ,adenopathy, persistant / recurrent hoarseness, swallowing issues, chest pain, edema,persistant / recurrent cough, hemoptysis, dyspnea(rest, exertional, paroxysmal nocturnal), gastrointestinal  bleeding (melena, rectal bleeding), abdominal pain, excessive heart burn, GU symptoms(dysuria, hematuria, pyuria,  voiding/incontinence  Issues) syncope, focal weakness, severe memory loss, concerning skin lesions, depression, anxiety, abnormal bruising/bleeding, major joint swelling.    Objective:   Physical Exam BP 130/78  Pulse 64  Temp(Src) 97.8 F (36.6 C)  Ht 5' 8.5" (1.74 m)  Wt 200 lb (90.719 kg)  BMI 29.96 kg/m2 General:  overweght male in NAD Eyes:  PERRL Ears:  External ear exam shows no significant lesions or deformities.  Otoscopic examination reveals clear canals, tympanic membranes are intact bilaterally without bulging, retraction, inflammation or discharge. Hearing is grossly normal bilaterally. Nose:  External nasal examination shows no deformity or inflammation. Nasal mucosa are pink and moist without lesions or exudates. Mouth:  Oral mucosa and oropharynx without lesions or exudates.  Teeth in good repair. Neck:  no carotid bruit or thyromegaly no cervical or supraclavicular lymphadenopathy  Lungs:  Normal respiratory effort, chest expands symmetrically. Lungs are clear to auscultation, no crackles or wheezes. Heart:  Normal rate and regular rhythm. S1 and S2 normal without gallop, murmur, click, rub or other extra sounds. Abdomen:  Bowel sounds positive,abdomen soft and non-tender without masses, organomegaly or hernias noted. Genitalia:  Testes bilaterally descended without nodularity, tenderness or masses. No scrotal masses or lesions. No penis lesions or urethral discharge. Prostate:  Prostate gland firm and smooth, 1 plus enlargement, no nodularity, tenderness, mass, asymmetry or induration. Pulses:  R and L posterior tibial pulses are full and equal bilaterally  Extremities:  no edema       Assessment & Plan:   1. Routine general medical examination at a health care facility The patients weight, height, BMI and visual acuity have been recorded in the chart I have made referrals, counseling and provided education to the patient based review of the above and I have provided  the pt with a written personalized care plan for preventive services.  He will call insurance company about zostavax.  2. COLONIC POLYPS, HX OF - ADENOMAS UTD colonoscopy.  3. Impotence Responds well to cialis.  4. Elevated PSA, less than 10 ng/ml The natural history of prostate cancer and ongoing controversy regarding screening and potential treatment outcomes of prostate cancer has been discussed with the patient. The meaning of a false positive PSA and possibility of cancerous lesions discuss.  He would like to recheck PSA in 2-3 months and defer urology referral at this time. The patient indicates understanding of these issues and agrees with the plan.

## 2012-06-27 NOTE — Patient Instructions (Addendum)
Great to see you, Ricky Boyd. Say hello to your son for me.  Check with your insurance to see if they will cover the shingles shot. Please come back in 2 months to get your PSA rechecked.

## 2012-08-17 ENCOUNTER — Ambulatory Visit (INDEPENDENT_AMBULATORY_CARE_PROVIDER_SITE_OTHER): Payer: Medicare Other | Admitting: Family Medicine

## 2012-08-17 ENCOUNTER — Encounter: Payer: Self-pay | Admitting: Family Medicine

## 2012-08-17 VITALS — BP 120/74 | HR 64 | Temp 97.3°F | Ht 68.5 in | Wt 198.0 lb

## 2012-08-17 DIAGNOSIS — M109 Gout, unspecified: Secondary | ICD-10-CM

## 2012-08-17 MED ORDER — INDOMETHACIN 50 MG PO CAPS: 50.0000 mg | ORAL_CAPSULE | Freq: Three times a day (TID) | ORAL | Status: DC

## 2012-08-17 NOTE — Progress Notes (Signed)
Nature conservation officer at [facility] [address] [place] [address] Phone: [phone] Fax: [phone]  Date:  08/17/2012   Name:  Ricky Boyd   DOB:  [birth year]   MRN:  [id number] Gender: male Age: 72 y.o.  Primary Physician:  Ruthe Mannan, MD  Evaluating MD: Hannah Beat, MD   Chief Complaint: Gout   History of Present Illness:  Ricky Boyd is a 73 y.o. pleasant patient who presents with the following:  R 1st MTP, had bad pain started at age 8 yo. 2-3 days of pain, redness, and swelling and the 1st R MTP. No trauma.  Patient Active Problem List   Diagnosis Date Noted  . Elevated PSA, less than 10 ng/ml 06/27/2012  . Routine general medical examination at a health care facility 06/20/2012  . Impotence 01/28/2012  . GOUT 12/26/2008  . MEMORY LOSS 12/26/2008  . 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 Hearing aid worn   . Memory deficit   . Arthritis     Past Surgical History  Procedure Laterality Date  . Hemorroidectomy    . Tonsillectomy and adenoidectomy    . Colonoscopy      History   Social History  . Marital Status: Single    Spouse Name: N/A    Number of Children: N/A  . Years of Education: N/A   Occupational History  . Not on file.   Social History Main Topics  . Smoking status: Former Games developer  . Smokeless tobacco: Former Neurosurgeon  . Alcohol Use: 9.6 oz/week    16 Cans of beer per week     Comment: vodka  . Drug Use: No  . Sexually Active: Not on file   Other Topics Concern  . Not on file   Social History Narrative   Desires CPR.      Would desire life support and and possibly feeding tube.    Family History  Problem Relation Age of Onset  . Colon cancer Neg Hx     Allergies  Allergen Reactions  . Ibuprofen Other (See Comments)    Confusion on 800mg   Tolerates smaller doses  . Amoxicillin-Pot Clavulanate  Other (See Comments)    Bad Dreams  . Erythromycin Ethylsuccinate Other (See Comments)     irritates eyes    Medication list has been reviewed and updated.  Outpatient Prescriptions Prior to Visit  Medication Sig Dispense Refill  . Ascorbic Acid (VITAMIN C) 500 MG CAPS Take 1 capsule by mouth daily.      . fish oil-omega-3 fatty acids 1000 MG capsule Take 1 g by mouth 2 (two) times daily.      Marland Kitchen glucosamine-chondroitin 500-400 MG tablet Take 1 tablet by mouth 2 (two) times daily.      . Multiple Vitamins-Minerals (MULTIVITAMIN WITH MINERALS) tablet Take 1 tablet by mouth daily.      . saw palmetto 160 MG capsule Take 160 mg by mouth 2 (two) times daily.      . tadalafil (CIALIS) 10 MG tablet Take 1 tablet (10 mg total) by mouth daily as needed.  10 tablet  0  . donepezil (ARICEPT) 10 MG tablet Take one tablet by mouth once a day.  30 tablet  3   No facility-administered medications prior to visit.    Review of Systems:   GEN: No fevers, chills. Nontoxic. Primarily MSK c/o  today. MSK: Detailed in the HPI GI: tolerating PO intake without difficulty Neuro: No numbness, parasthesias, or tingling associated. Otherwise the pertinent positives of the ROS are noted above.    Physical Examination: BP 120/74  Pulse 64  Temp(Src) 97.3 F (36.3 C) (Oral)  Ht 5' 8.5" (1.74 m)  Wt 198 lb (89.812 kg)  BMI 29.66 kg/m2  SpO2 96%  Ideal Body Weight: Weight in (lb) to have BMI = 25: 166.5   GEN: WDWN, NAD, Non-toxic, Alert & Oriented x 3 HEENT: Atraumatic, Normocephalic.  Ears and Nose: No external deformity. EXTR: No clubbing/cyanosis/edema NEURO: Normal gait.  PSYCH: Normally interactive. Conversant. Not depressed or anxious appearing.  Calm demeanor.   R MTP, mildly red, limited ROM, TTP at the joint line. NT all other bony anatomy.  Assessment and Plan:  GOUT  Indocin, if not improving in 2 days, add colcrys.  Orders Today:  No orders of the defined types were placed in  this encounter.    Updated Medication List: (Includes new medications, updates to list, dose adjustments) Meds ordered this encounter  Medications  . indomethacin (INDOCIN) 50 MG capsule    Sig: Take 1 capsule (50 mg total) by mouth 3 (three) times daily with meals.    Dispense:  30 capsule    Refill:  0    Medications Discontinued: Medications Discontinued During This Encounter  Medication Reason  . donepezil (ARICEPT) 10 MG tablet Error      Signed, Suttyn Cryder T. Yuleidy Rappleye, MD 08/17/2012 3:29 PM

## 2012-08-30 ENCOUNTER — Ambulatory Visit (INDEPENDENT_AMBULATORY_CARE_PROVIDER_SITE_OTHER): Payer: Medicare Other | Admitting: Family Medicine

## 2012-08-30 ENCOUNTER — Encounter: Payer: Self-pay | Admitting: Family Medicine

## 2012-08-30 VITALS — BP 112/82 | HR 72 | Temp 97.9°F | Wt 198.0 lb

## 2012-08-30 DIAGNOSIS — Z23 Encounter for immunization: Secondary | ICD-10-CM

## 2012-08-30 DIAGNOSIS — Z2911 Encounter for prophylactic immunotherapy for respiratory syncytial virus (RSV): Secondary | ICD-10-CM

## 2012-08-30 DIAGNOSIS — R972 Elevated prostate specific antigen [PSA]: Secondary | ICD-10-CM

## 2012-08-30 LAB — PSA: PSA: 6.12 ng/mL — ABNORMAL HIGH (ref 0.10–4.00)

## 2012-08-30 MED ORDER — DONEPEZIL HCL 10 MG PO TABS: ORAL_TABLET | ORAL | Status: DC

## 2012-08-30 NOTE — Progress Notes (Signed)
Subjective:    Patient ID: Ricky Boyd, male    DOB: [birth year], 72 y.o.   MRN: [id number]  HPI  Very pleasant 72 yo male here for follow up elevated PSA.  At annual physical in April, PSA was elevated. Lab Results  Component Value Date   PSA 4.89* 06/22/2012   PSA 2.55 12/31/2008   PSA 1.86 02/23/2005     Was not having and still not having any increased difficulty starting or stopping stream.  No dysuria.  No family h/o prostate CA.  The natural history of prostate cancer and ongoing controversy regarding screening and potential treatment outcomes of prostate cancer was discussed with the patient. The meaning of a false positive PSA and possibility of cancerous lesions discussed.  He chose to recheck PSA in 2-3 months and defered urology referral.  He is here for follow up today.  Patient Active Problem List   Diagnosis Date Noted  . Elevated PSA, less than 10 ng/ml 06/27/2012  . Routine general medical examination at a health care facility 06/20/2012  . Impotence 01/28/2012  . GOUT 12/26/2008  . MEMORY LOSS 12/26/2008  . 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 Hearing aid worn   . Memory deficit   . Arthritis    Past Surgical History  Procedure Laterality Date  . Hemorroidectomy    . Tonsillectomy and adenoidectomy    . Colonoscopy     History  Substance Use Topics  . Smoking status: Former Games developer  . Smokeless tobacco: Former Neurosurgeon  . Alcohol Use: 9.6 oz/week    16 Cans of beer per week     Comment: vodka   Family History  Problem Relation Age of Onset  . Colon cancer Neg Hx    Allergies  Allergen Reactions  . Amoxicillin-Pot Clavulanate Other (See Comments)    Bad Dreams  . Erythromycin Ethylsuccinate Other (See Comments)     irritates eyes  . Ibuprofen Other (See Comments)    Confusion on 800mg   Tolerates smaller doses   Current Outpatient  Prescriptions on File Prior to Visit  Medication Sig Dispense Refill  . Ascorbic Acid (VITAMIN C) 500 MG CAPS Take 1 capsule by mouth daily.      . fish oil-omega-3 fatty acids 1000 MG capsule Take 1 g by mouth 2 (two) times daily.      Marland Kitchen glucosamine-chondroitin 500-400 MG tablet Take 1 tablet by mouth 2 (two) times daily.      . indomethacin (INDOCIN) 50 MG capsule Take 1 capsule (50 mg total) by mouth 3 (three) times daily with meals.  30 capsule  0  . Multiple Vitamins-Minerals (MULTIVITAMIN WITH MINERALS) tablet Take 1 tablet by mouth daily.      . saw palmetto 160 MG capsule Take 160 mg by mouth 2 (two) times daily.      . tadalafil (CIALIS) 10 MG tablet Take 1 tablet (10 mg total) by mouth daily as needed.  10 tablet  0   No current facility-administered medications on file prior to visit.   The PMH, PSH, Social History, Family History, Medications, and allergies have been reviewed in [facility], and have been updated if relevant.    Review of Systems See HPI   Objective:   Physical Exam BP 112/82  Pulse 72  Temp(Src) 97.9 F (36.6 C)  Wt 198 lb (89.812 kg)  BMI 29.66 kg/m2 General:  overweght male in NAD Eyes:  PERRL Ears:  External ear exam shows no significant lesions or deformities.  Otoscopic examination reveals clear canals, tympanic membranes are intact bilaterally without bulging, retraction, inflammation or discharge. Hearing is grossly normal bilaterally. Nose:  External nasal examination shows no deformity or inflammation. Nasal mucosa are pink and moist without lesions or exudates. Mouth:  Oral mucosa and oropharynx without lesions or exudates.  Teeth in good repair. Neck:  no carotid bruit or thyromegaly no cervical or supraclavicular lymphadenopathy  Lungs:  Normal respiratory effort, chest expands symmetrically. Lungs are clear to auscultation, no crackles or wheezes. Heart:  Normal rate and regular rhythm. S1 and S2 normal without gallop, murmur, click, rub or other  extra sounds. Abdomen:  Bowel sounds positive,abdomen soft and non-tender without masses, organomegaly or hernias noted. Genitalia:  Testes bilaterally descended without nodularity, tenderness or masses. No scrotal masses or lesions. No penis lesions or urethral discharge. Prostate:  Prostate gland firm and smooth, 1 plus enlargement, no nodularity, tenderness, mass, asymmetry or induration (unchanged). Pulses:  R and L posterior tibial pulses are full and equal bilaterally  Extremities:  no edema       Assessment & Plan:    . Elevated PSA, less than 10 ng/ml Stable on exam. Recheck PSA today. If increased, he has agreed to urology referral.

## 2012-08-30 NOTE — Addendum Note (Signed)
Addended by: Dianne Dun on: 08/30/2012 04:23 PM   Modules accepted: Orders

## 2012-08-30 NOTE — Patient Instructions (Addendum)
Good to see you. We will call you with your lab results.  Say hi to your family for me.

## 2012-08-30 NOTE — Addendum Note (Signed)
Addended by: Eliezer Bottom on: 08/30/2012 11:59 AM   Modules accepted: Orders

## 2013-01-09 ENCOUNTER — Other Ambulatory Visit: Payer: Self-pay | Admitting: Family Medicine

## 2013-01-09 NOTE — Telephone Encounter (Signed)
Last office visit 08/30/2012.  Ok to refill? 

## 2013-02-24 ENCOUNTER — Ambulatory Visit (INDEPENDENT_AMBULATORY_CARE_PROVIDER_SITE_OTHER): Payer: Medicare Other | Admitting: Internal Medicine

## 2013-02-24 ENCOUNTER — Encounter: Payer: Self-pay | Admitting: Internal Medicine

## 2013-02-24 VITALS — BP 124/78 | HR 82 | Temp 98.2°F | Wt 201.2 lb

## 2013-02-24 DIAGNOSIS — B9789 Other viral agents as the cause of diseases classified elsewhere: Principal | ICD-10-CM

## 2013-02-24 DIAGNOSIS — J069 Acute upper respiratory infection, unspecified: Secondary | ICD-10-CM

## 2013-02-24 MED ORDER — HYDROCODONE-HOMATROPINE 5-1.5 MG/5ML PO SYRP: 5.0000 mL | ORAL_SOLUTION | Freq: Three times a day (TID) | ORAL | Status: DC | PRN

## 2013-02-24 NOTE — Progress Notes (Signed)
HPI  Pt presents to the clinic today with c/o cold symptoms x 4 days. He has a productive cough of thick green sputum and chest congestion. He has been taking Mucinex OTC without any relief. He denies fever, chills or body aches. He has no history of allergies or asthma. He has had sick contacts.  Review of Systems      Past Medical History  Diagnosis Date  . Hearing aid worn   . Memory deficit   . Arthritis     Family History  Problem Relation Age of Onset  . Colon cancer Neg Hx     History   Social History  . Marital Status: Single    Spouse Name: N/A    Number of Children: N/A  . Years of Education: N/A   Occupational History  . Not on file.   Social History Main Topics  . Smoking status: Former Research scientist (life sciences)  . Smokeless tobacco: Former Systems developer  . Alcohol Use: 9.6 oz/week    16 Cans of beer per week     Comment: vodka  . Drug Use: No  . Sexual Activity: Not on file   Other Topics Concern  . Not on file   Social History Narrative   Desires CPR.      Would desire life support and and possibly feeding tube.    Allergies  Allergen Reactions  . Amoxicillin-Pot Clavulanate Other (See Comments)    Bad Dreams  . Erythromycin Ethylsuccinate Other (See Comments)     irritates eyes  . Ibuprofen Other (See Comments)    Confusion on 800mg   Tolerates smaller doses     Constitutional: Positive headache, fatigue. Denies fever or abrupt weight changes.  HEENT:  Positive sore throat. Denies eye redness, eye pain, pressure behind the eyes, facial pain, nasal congestion, ear pain, ringing in the ears, wax buildup, runny nose or bloody nose. Respiratory: Positive cough. Denies difficulty breathing or shortness of breath.  Cardiovascular: Denies chest pain, chest tightness, palpitations or swelling in the hands or feet.   No other specific complaints in a complete review of systems (except as listed in HPI above).  Objective:   BP 124/78  Pulse 82  Temp(Src) 98.2 F (36.8  C) (Oral)  Wt 201 lb 4 oz (91.286 kg)  SpO2 98% Wt Readings from Last 3 Encounters:  02/24/13 201 lb 4 oz (91.286 kg)  08/30/12 198 lb (89.812 kg)  08/17/12 198 lb (89.812 kg)     General: Appears his stated age, well developed, well nourished in NAD. HEENT: Head: normal shape and size; Eyes: sclera white, no icterus, conjunctiva pink, PERRLA and EOMs intact; Ears: Tm's gray and intact, normal light reflex; Nose: mucosa pink and moist, septum midline; Throat/Mouth: + PND. Teeth present, mucosa erythematous and moist, no exudate noted, no lesions or ulcerations noted.  Neck: Mild cervical lymphadenopathy. Neck supple, trachea midline. No massses, lumps or thyromegaly present.  Cardiovascular: Normal rate and rhythm. S1,S2 noted.  No murmur, rubs or gallops noted. No JVD or BLE edema. No carotid bruits noted. Pulmonary/Chest: Normal effort and positive vesicular breath sounds. No respiratory distress. No wheezes, rales or ronchi noted.      Assessment & Plan:   Upper Respiratory Infection, likely viral at this point  Get some rest and drink plenty of water Do salt water gargles for the sore throat eRx for Hycodan cough syrup  RTC as needed or if symptoms persist.

## 2013-02-24 NOTE — Patient Instructions (Signed)

## 2013-02-24 NOTE — Progress Notes (Signed)
Pre-visit discussion using our clinic review tool. No additional management support is needed unless otherwise documented below in the visit note.  

## 2013-02-27 ENCOUNTER — Encounter: Payer: Self-pay | Admitting: Internal Medicine

## 2013-02-27 ENCOUNTER — Ambulatory Visit (INDEPENDENT_AMBULATORY_CARE_PROVIDER_SITE_OTHER): Payer: Medicare Other | Admitting: Internal Medicine

## 2013-02-27 VITALS — BP 120/70 | HR 96 | Temp 97.5°F | Wt 197.0 lb

## 2013-02-27 DIAGNOSIS — J209 Acute bronchitis, unspecified: Secondary | ICD-10-CM | POA: Insufficient documentation

## 2013-02-27 DIAGNOSIS — M109 Gout, unspecified: Secondary | ICD-10-CM

## 2013-02-27 MED ORDER — AZITHROMYCIN 250 MG PO TABS: ORAL_TABLET | ORAL | Status: DC

## 2013-02-27 MED ORDER — COLCHICINE 0.6 MG PO TABS: 0.6000 mg | ORAL_TABLET | Freq: Two times a day (BID) | ORAL | Status: DC | PRN

## 2013-02-27 NOTE — Assessment & Plan Note (Signed)
Exacerbated now Will try colchicine

## 2013-02-27 NOTE — Progress Notes (Signed)
Subjective:    Patient ID: Christie Copley, male    DOB: [birth year], 73 y.o.   MRN: [id number]  HPI Still sick Cough is terrible---more than ever before in his life Both day and night "Ugly" sputum--- from chest and post nasal drip Not SOB Feels like he had fever---due to sweats (day and night) Not much head congestion Mild sore throat No ear pain No headache  Tried some OTC cold meds--not helpful Cough syrup has helped some  Also feels his gout is acting up Right great toe Hard to walk Hasn't used colchicine that he knows  Current Outpatient Prescriptions on File Prior to Visit  Medication Sig Dispense Refill  . Ascorbic Acid (VITAMIN C) 500 MG CAPS Take 1 capsule by mouth daily.      Marland Kitchen donepezil (ARICEPT) 10 MG tablet Take one tablet by mouth once a day.  30 tablet  3  . fish oil-omega-3 fatty acids 1000 MG capsule Take 1 g by mouth 2 (two) times daily.      Marland Kitchen glucosamine-chondroitin 500-400 MG tablet Take 1 tablet by mouth 2 (two) times daily.      Marland Kitchen HYDROcodone-homatropine (HYCODAN) 5-1.5 MG/5ML syrup Take 5 mLs by mouth every 8 (eight) hours as needed for cough.  240 mL  0  . indomethacin (INDOCIN) 50 MG capsule TAKE 1 CAPSULE BY MOUTH 3 TIMES A DAY WITH FOOD.  30 capsule  0  . Multiple Vitamins-Minerals (MULTIVITAMIN WITH MINERALS) tablet Take 1 tablet by mouth daily.      . saw palmetto 160 MG capsule Take 160 mg by mouth 2 (two) times daily.      . tadalafil (CIALIS) 10 MG tablet Take 1 tablet (10 mg total) by mouth daily as needed.  10 tablet  0   No current facility-administered medications on file prior to visit.    Allergies  Allergen Reactions  . Amoxicillin-Pot Clavulanate Other (See Comments)    Bad Dreams  . Erythromycin Ethylsuccinate Other (See Comments)     irritates eyes  . Ibuprofen Other (See Comments)    Confusion on 800mg   Tolerates smaller doses    Past Medical History  Diagnosis Date  . Hearing aid worn   . Memory deficit   . Arthritis      Past Surgical History  Procedure Laterality Date  . Hemorroidectomy    . Tonsillectomy and adenoidectomy    . Colonoscopy      Family History  Problem Relation Age of Onset  . Colon cancer Neg Hx     History   Social History  . Marital Status: Single    Spouse Name: N/A    Number of Children: N/A  . Years of Education: N/A   Occupational History  . Not on file.   Social History Main Topics  . Smoking status: Former Research scientist (life sciences)  . Smokeless tobacco: Former Systems developer  . Alcohol Use: 9.6 oz/week    16 Cans of beer per week     Comment: vodka  . Drug Use: No  . Sexual Activity: Not on file   Other Topics Concern  . Not on file   Social History Narrative   Desires CPR.      Would desire life support and and possibly feeding tube.   Review of Systems No rash No vomiting or diarrhea Appetite is okay     Objective:   Physical Exam  Constitutional: He appears well-developed and well-nourished. No distress.  HENT:  Mouth/Throat: Oropharynx is clear and moist.  No oropharyngeal exudate.  No sinus tenderness Mild nasal inflammation   Neck: Normal range of motion. Neck supple.  Pulmonary/Chest: Effort normal. No respiratory distress. He has no wheezes.  Fine dry bibasilar crackles (just at very bases)  Musculoskeletal:  Mild redness with severe tenderness at right 1st MTP and at proximal base of 1st or 2nd metatarsals on right  Lymphadenopathy:    He has no cervical adenopathy.          Assessment & Plan:

## 2013-02-27 NOTE — Patient Instructions (Signed)
Please try the colchicine for the gout. Take one tab, and you can repeat this again in 1-2 hours. If you don't have any stomach or intestinal problems, you could even take a third (but I generally recommend limiting it to 2 a day). If you gout improves, continue this for a few days till it has totally settled down.  Gout Gout is an inflammatory arthritis caused by a buildup of uric acid crystals in the joints. Uric acid is a chemical that is normally present in the blood. When the level of uric acid in the blood is too high it can form crystals that deposit in your joints and tissues. This causes joint redness, soreness, and swelling (inflammation). Repeat attacks are common. Over time, uric acid crystals can form into masses (tophi) near a joint, destroying bone and causing disfigurement. Gout is treatable and often preventable. CAUSES  The disease begins with elevated levels of uric acid in the blood. Uric acid is produced by your body when it breaks down a naturally found substance called purines. Certain foods you eat, such as meats and fish, contain high amounts of purines. Causes of an elevated uric acid level include:  Being passed down from parent to child (heredity).  Diseases that cause increased uric acid production (such as obesity, psoriasis, and certain cancers).  Excessive alcohol use.  Diet, especially diets rich in meat and seafood.  Medicines, including certain cancer-fighting medicines (chemotherapy), water pills (diuretics), and aspirin.  Chronic kidney disease. The kidneys are no longer able to remove uric acid well.  Problems with metabolism. Conditions strongly associated with gout include:  Obesity.  High blood pressure.  High cholesterol.  Diabetes. Not everyone with elevated uric acid levels gets gout. It is not understood why some people get gout and others do not. Surgery, joint injury, and eating too much of certain foods are some of the factors that can lead  to gout attacks. SYMPTOMS   An attack of gout comes on quickly. It causes intense pain with redness, swelling, and warmth in a joint.  Fever can occur.  Often, only one joint is involved. Certain joints are more commonly involved:  Base of the big toe.  Knee.  Ankle.  Wrist.  Finger. Without treatment, an attack usually goes away in a few days to weeks. Between attacks, you usually will not have symptoms, which is different from many other forms of arthritis. DIAGNOSIS  Your caregiver will suspect gout based on your symptoms and exam. In some cases, tests may be recommended. The tests may include:  Blood tests.  Urine tests.  X-rays.  Joint fluid exam. This exam requires a needle to remove fluid from the joint (arthrocentesis). Using a microscope, gout is confirmed when uric acid crystals are seen in the joint fluid. TREATMENT  There are two phases to gout treatment: treating the sudden onset (acute) attack and preventing attacks (prophylaxis).  Treatment of an Acute Attack.  Medicines are used. These include anti-inflammatory medicines or steroid medicines.  An injection of steroid medicine into the affected joint is sometimes necessary.  The painful joint is rested. Movement can worsen the arthritis.  You may use warm or cold treatments on painful joints, depending which works best for you.  Treatment to Prevent Attacks.  If you suffer from frequent gout attacks, your caregiver may advise preventive medicine. These medicines are started after the acute attack subsides. These medicines either help your kidneys eliminate uric acid from your body or decrease your uric acid  production. You may need to stay on these medicines for a very long time.  The early phase of treatment with preventive medicine can be associated with an increase in acute gout attacks. For this reason, during the first few months of treatment, your caregiver may also advise you to take medicines  usually used for acute gout treatment. Be sure you understand your caregiver's directions. Your caregiver may make several adjustments to your medicine dose before these medicines are effective.  Discuss dietary treatment with your caregiver or dietitian. Alcohol and drinks high in sugar and fructose and foods such as meat, poultry, and seafood can increase uric acid levels. Your caregiver or dietician can advise you on drinks and foods that should be limited. HOME CARE INSTRUCTIONS   Do not take aspirin to relieve pain. This raises uric acid levels.  Only take over-the-counter or prescription medicines for pain, discomfort, or fever as directed by your caregiver.  Rest the joint as much as possible. When in bed, keep sheets and blankets off painful areas.  Keep the affected joint raised (elevated).  Apply warm or cold treatments to painful joints. Use of warm or cold treatments depends on which works best for you.  Use crutches if the painful joint is in your leg.  Drink enough fluids to keep your urine clear or pale yellow. This helps your body get rid of uric acid. Limit alcohol, sugary drinks, and fructose drinks.  Follow your dietary instructions. Pay careful attention to the amount of protein you eat. Your daily diet should emphasize fruits, vegetables, whole grains, and fat-free or low-fat milk products. Discuss the use of coffee, vitamin C, and cherries with your caregiver or dietician. These may be helpful in lowering uric acid levels.  Maintain a healthy body weight. SEEK MEDICAL CARE IF:   You develop diarrhea, vomiting, or any side effects from medicines.  You do not feel better in 24 hours, or you are getting worse. SEEK IMMEDIATE MEDICAL CARE IF:   Your joint becomes suddenly more tender, and you have chills or a fever. MAKE SURE YOU:   Understand these instructions.  Will watch your condition.  Will get help right away if you are not doing well or get  worse. Document Released: 02/07/2000 Document Revised: 06/06/2012 Document Reviewed: 09/23/2011 [facility] Patient Information 2014 [place].

## 2013-02-27 NOTE — Progress Notes (Signed)
Pre-visit discussion using our clinic review tool. No additional management support is needed unless otherwise documented below in the visit note.  

## 2013-02-27 NOTE — Assessment & Plan Note (Signed)
Still sick after a week Did well with azithromycin in past---will try that now

## 2013-03-01 ENCOUNTER — Telehealth: Payer: Self-pay

## 2013-03-01 MED ORDER — PREDNISONE 20 MG PO TABS: ORAL_TABLET | ORAL | Status: DC

## 2013-03-01 NOTE — Telephone Encounter (Signed)
Rx sent in as directed and message left notifying patient.

## 2013-03-01 NOTE — Telephone Encounter (Signed)
Left v/m that pt was seen 02/27/13 and colcrys was given; pt gout has worsened, rt big toe to heel is red and swollen and very painful. Pain level now is 7-8. Please advise. [place]. Pt request cb.

## 2013-03-01 NOTE — Telephone Encounter (Signed)
I would assume this is still the gout since he is on an antibiotic  Okay to send Rx for prednisone 20mg  #15 x 0 2 tabs daily for 5 days, then 1 tab daily for 5 days If not better within 2 days, needs to be seen again

## 2013-03-03 NOTE — Telephone Encounter (Signed)
Spoke with Ricky Boyd and advised results, she states pt is feeling a little, she will call if anything changes

## 2013-03-03 NOTE — Telephone Encounter (Addendum)
Ricky Boyd left v/m; thinks colcrys has caused Ricky Boyd to have diarrhea; Ms Ricky Boyd request clarification if Ricky Boyd should take prednisone with colcrys. Ms Ricky Boyd thinks should stop colcrys. Spoke with Ricky Boyd had watery stool x 5 on 03/02/13, no diarrhea diarrhea. Ricky Boyd took prednisone 20 mg taking 2 tabs on 03/02/13. Today Ricky Boyd has only taken one prednisone. Ricky Boyd has not taken colcrys today. Today gout pain is much better pain is much less and swelling has gone out of instep of foot. Ricky Boyd wants to know Dr Everardo Beals advice on should continue taking colycrys or what to do.

## 2013-03-03 NOTE — Telephone Encounter (Signed)
You can take prednisone and colcrys together but no need to continue the colcrys if it didn't help and was causing diarrhea (which is fairly common especially if taking 2 or more a day)  The prednisone should be taken for the 10 days I prescribed because the gout is likely to flare again if you don't  If you get recurrent gout attacks, we should plan on starting a daily preventative

## 2013-04-17 ENCOUNTER — Encounter: Payer: Self-pay | Admitting: Family Medicine

## 2013-04-17 ENCOUNTER — Ambulatory Visit (INDEPENDENT_AMBULATORY_CARE_PROVIDER_SITE_OTHER): Payer: Medicare Other | Admitting: Family Medicine

## 2013-04-17 VITALS — BP 124/68 | HR 66 | Temp 97.0°F | Wt 202.0 lb

## 2013-04-17 DIAGNOSIS — M79609 Pain in unspecified limb: Secondary | ICD-10-CM

## 2013-04-17 DIAGNOSIS — M79671 Pain in right foot: Secondary | ICD-10-CM | POA: Insufficient documentation

## 2013-04-17 NOTE — Progress Notes (Signed)
Pre-visit discussion using our clinic review tool. No additional management support is needed unless otherwise documented below in the visit note.  

## 2013-04-17 NOTE — Progress Notes (Signed)
Subjective:   Patient ID: Ricky Boyd, male    DOB: 1940/09/04, 73 y.o.   MRN: [id number]  Ricky Boyd is a pleasant 73 y.o. year old male who presents to clinic today with Foot Pain  on 04/17/2013  HPI: One week of right foot pain.  No known injury.  Side of foot became very swollen and tender to touch.  Getting better- almost completely resolved.  No longer red.  Has had gout before but was in his great toe.  Thought about cancelling today's appointment but felt it is better I am aware that he had this pain.  Patient Active Problem List   Diagnosis Date Noted  . Right foot pain 04/17/2013  . Acute bronchitis 02/27/2013  . Elevated PSA, less than 10 ng/ml 06/27/2012  . Routine general medical examination at a health care facility 06/20/2012  . Impotence 01/28/2012  . GOUT 12/26/2008  . MEMORY LOSS 12/26/2008  . 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 Hearing aid worn   . Memory deficit   . Arthritis    Past Surgical History  Procedure Laterality Date  . Hemorroidectomy    . Tonsillectomy and adenoidectomy    . Colonoscopy     History  Substance Use Topics  . Smoking status: Former Research scientist (life sciences)  . Smokeless tobacco: Former Systems developer  . Alcohol Use: 9.6 oz/week    16 Cans of beer per week     Comment: vodka   Family History  Problem Relation Age of Onset  . Colon cancer Neg Hx    Allergies  Allergen Reactions  . Amoxicillin-Pot Clavulanate Other (See Comments)    Bad Dreams  . Erythromycin Ethylsuccinate Other (See Comments)     irritates eyes  . Ibuprofen Other (See Comments)    Confusion on 800mg   Tolerates smaller doses   Current Outpatient Prescriptions on File Prior to Visit  Medication Sig Dispense Refill  . Ascorbic Acid (VITAMIN C) 500 MG CAPS Take 1 capsule by mouth daily.      . colchicine 0.6 MG tablet Take 1 tablet (0.6 mg total) by mouth 2 (two)  times daily as needed.  60 tablet  1  . donepezil (ARICEPT) 10 MG tablet Take one tablet by mouth once a day.  30 tablet  3  . fish oil-omega-3 fatty acids 1000 MG capsule Take 1 g by mouth 2 (two) times daily.      Marland Kitchen glucosamine-chondroitin 500-400 MG tablet Take 1 tablet by mouth 2 (two) times daily.      . indomethacin (INDOCIN) 50 MG capsule TAKE 1 CAPSULE BY MOUTH 3 TIMES A DAY WITH FOOD.  30 capsule  0  . Multiple Vitamins-Minerals (MULTIVITAMIN WITH MINERALS) tablet Take 1 tablet by mouth daily.      . saw palmetto 160 MG capsule Take 160 mg by mouth 2 (two) times daily.      . tadalafil (CIALIS) 10 MG tablet Take 1 tablet (10 mg total) by mouth daily as needed.  10 tablet  0   No current facility-administered medications on file prior to visit.   The PMH, PSH, Social History, Family History, Medications, and allergies have been reviewed in [facility], and have been updated if relevant.   Review of Systems  Constitutional: Negative.   Neurological: Negative.   All other systems reviewed and are negative.       Objective:  BP 124/68  Pulse 66  Temp(Src) 97 F (36.1 C) (Oral)  Wt 202 lb (91.627 kg)  SpO2 97%   Physical Exam  Nursing note and vitals reviewed. Constitutional: He appears well-developed and well-nourished. No distress.  Musculoskeletal:       Right ankle: Normal.       Feet:          Assessment & Plan:   No diagnosis found. No Follow-up on file.

## 2013-04-17 NOTE — Patient Instructions (Signed)
Good to see you. Since your foot is getting better, let's just keep doing what you're doing. Call me if it gets worse again and we can get an xray or blood work.

## 2013-04-17 NOTE — Assessment & Plan Note (Signed)
Resolved. ? Gout.  Pt does not want further work up at this time. Call or return to clinic prn if these symptoms worsen or fail to improve as anticipated. The patient indicates understanding of these issues and agrees with the plan.

## 2013-06-20 ENCOUNTER — Encounter: Payer: Self-pay | Admitting: Family Medicine

## 2013-06-20 ENCOUNTER — Ambulatory Visit (INDEPENDENT_AMBULATORY_CARE_PROVIDER_SITE_OTHER)
Admission: RE | Admit: 2013-06-20 | Discharge: 2013-06-20 | Disposition: A | Discharge status: Home / Self Care | Payer: Medicare Other | Source: Ambulatory Visit | Attending: Family Medicine | Admitting: Family Medicine

## 2013-06-20 ENCOUNTER — Ambulatory Visit (INDEPENDENT_AMBULATORY_CARE_PROVIDER_SITE_OTHER): Payer: Medicare Other | Admitting: Family Medicine

## 2013-06-20 VITALS — BP 124/70 | HR 67 | Temp 97.4°F | Wt 205.5 lb

## 2013-06-20 DIAGNOSIS — M79609 Pain in unspecified limb: Secondary | ICD-10-CM

## 2013-06-20 DIAGNOSIS — M109 Gout, unspecified: Secondary | ICD-10-CM

## 2013-06-20 DIAGNOSIS — M79671 Pain in right foot: Secondary | ICD-10-CM

## 2013-06-20 LAB — URIC ACID: Uric Acid, Serum: 8.3 mg/dL — ABNORMAL HIGH (ref 4.0–7.8)

## 2013-06-20 MED ORDER — DONEPEZIL HCL 10 MG PO TABS: ORAL_TABLET | ORAL | Status: DC

## 2013-06-20 MED ORDER — INDOMETHACIN 50 MG PO CAPS: ORAL_CAPSULE | ORAL | Status: DC

## 2013-06-20 MED ORDER — COLCHICINE 0.6 MG PO TABS: 0.6000 mg | ORAL_TABLET | Freq: Two times a day (BID) | ORAL | Status: DC | PRN

## 2013-06-20 NOTE — Progress Notes (Signed)
Subjective:   Patient ID: Ricky Boyd, male    DOB: 1940-03-26, 73 y.o.   MRN: [id number]  Ricky Boyd is a pleasant 73 y.o. year old male who presents to clinic today with Toe Pain  on 06/20/2013  HPI: H/o gout.  Right great toe pain and swelling since last week- feels a little better today. Does take indocin and colchicine for gout flares. Does not have any indocin left but did take a colchine this am.  No longer swollen or red today.  Patient Active Problem List   Diagnosis Date Noted  . Right foot pain 04/17/2013  . Acute bronchitis 02/27/2013  . Elevated PSA, less than 10 ng/ml 06/27/2012  . Routine general medical examination at a health care facility 06/20/2012  . Impotence 01/28/2012  . GOUT 12/26/2008  . MEMORY LOSS 12/26/2008  . 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 Hearing aid worn   . Memory deficit   . Arthritis    Past Surgical History  Procedure Laterality Date  . Hemorroidectomy    . Tonsillectomy and adenoidectomy    . Colonoscopy     History  Substance Use Topics  . Smoking status: Former Research scientist (life sciences)  . Smokeless tobacco: Former Systems developer  . Alcohol Use: 9.6 oz/week    16 Cans of beer per week     Comment: vodka   Family History  Problem Relation Age of Onset  . Colon cancer Neg Hx    Allergies  Allergen Reactions  . Amoxicillin-Pot Clavulanate Other (See Comments)    Bad Dreams  . Erythromycin Ethylsuccinate Other (See Comments)     irritates eyes  . Ibuprofen Other (See Comments)    Confusion on 800mg   Tolerates smaller doses   Current Outpatient Prescriptions on File Prior to Visit  Medication Sig Dispense Refill  . Ascorbic Acid (VITAMIN C) 500 MG CAPS Take 1 capsule by mouth daily.      . fish oil-omega-3 fatty acids 1000 MG capsule Take 1 g by mouth 2 (two) times daily.      Marland Kitchen glucosamine-chondroitin 500-400 MG tablet Take  1 tablet by mouth 2 (two) times daily.      . Multiple Vitamins-Minerals (MULTIVITAMIN WITH MINERALS) tablet Take 1 tablet by mouth daily.      . saw palmetto 160 MG capsule Take 160 mg by mouth 2 (two) times daily.      . tadalafil (CIALIS) 10 MG tablet Take 1 tablet (10 mg total) by mouth daily as needed.  10 tablet  0   No current facility-administered medications on file prior to visit.   The PMH, PSH, Social History, Family History, Medications, and allergies have been reviewed in [facility], and have been updated if relevant.     Review of Systems    See HPI Objective:    BP 124/70  Pulse 67  Temp(Src) 97.4 F (36.3 C) (Oral)  Wt 205 lb 8 oz (93.214 kg)  SpO2 97%   Physical Exam  Nursing note and vitals reviewed. Constitutional: He appears well-developed and well-nourished. No distress.  HENT:  Head: Normocephalic.  Musculoskeletal:       Right foot: Normal. He exhibits normal range of motion, no tenderness, no bony tenderness, no swelling, normal capillary refill, no crepitus, no deformity and no laceration.          Assessment & Plan:   GOUT -  Plan: Uric Acid, DG Foot Complete Right No Follow-up on file.

## 2013-06-20 NOTE — Assessment & Plan Note (Signed)
Probable gout flare improving with colchicine. Check uric acid, xray today. Continue colchicine as per AVS. Call or return to clinic prn if these symptoms worsen or fail to improve as anticipated. Orders Placed This Encounter  Procedures  . DG Foot Complete Right  . Uric Acid

## 2013-06-20 NOTE — Progress Notes (Signed)
Pre visit review using our clinic review tool, if applicable. No additional management support is needed unless otherwise documented below in the visit note. 

## 2013-06-20 NOTE — Patient Instructions (Signed)
I will call you with your xray results and blood work.  Please try the colchicine for the gout. Take one tab, and you can repeat this again in 1-2 hours. If you don't have any stomach or intestinal problems, you could even take a third (but I generally recommend limiting it to 2 a day). If you gout improves, continue this for a few days till it has totally settled down.

## 2013-06-26 ENCOUNTER — Telehealth: Payer: Self-pay

## 2013-06-26 NOTE — Telephone Encounter (Signed)
pts son,Steve said pt took gout med(son is not sure name of med but thinks indocin) over weekend and pt could not remember where he was or what he had done earlier in the day. [name] saw his father on 06/25/13 and pts pupils were tiny. On 06/25/13 pt had his memory back. Today pt seems OK. Pt last took indocin on 06/24/13. [name] wants to know if there is another med to substitute for indocin. [name] request CB from Dr Deborra Medina at [phone] or [phone]. [name] will ck to verify name of med that caused pt to forget things. [facility].

## 2013-06-26 NOTE — Telephone Encounter (Signed)
[name] [name]) notified as instructed by telephone. Allergy list updated.

## 2013-06-26 NOTE — Telephone Encounter (Signed)
Yes do not take Indocin- and please add to allergy list.  I also gave him colchicine which he can take.  Please keep [place] updated with his symptoms.

## 2013-07-14 ENCOUNTER — Telehealth: Payer: Self-pay | Admitting: Family Medicine

## 2013-07-14 ENCOUNTER — Ambulatory Visit (INDEPENDENT_AMBULATORY_CARE_PROVIDER_SITE_OTHER)
Admission: RE | Admit: 2013-07-14 | Discharge: 2013-07-14 | Disposition: A | Discharge status: Home / Self Care | Payer: Medicare Other | Source: Ambulatory Visit | Attending: Family Medicine | Admitting: Family Medicine

## 2013-07-14 ENCOUNTER — Encounter: Payer: Self-pay | Admitting: Family Medicine

## 2013-07-14 ENCOUNTER — Ambulatory Visit (INDEPENDENT_AMBULATORY_CARE_PROVIDER_SITE_OTHER): Payer: Medicare Other | Admitting: Family Medicine

## 2013-07-14 VITALS — BP 114/70 | HR 60 | Temp 97.7°F | Wt 205.8 lb

## 2013-07-14 DIAGNOSIS — R079 Chest pain, unspecified: Secondary | ICD-10-CM

## 2013-07-14 DIAGNOSIS — R0781 Pleurodynia: Secondary | ICD-10-CM

## 2013-07-14 MED ORDER — TRAMADOL HCL 50 MG PO TABS: 50.0000 mg | ORAL_TABLET | Freq: Three times a day (TID) | ORAL | Status: DC | PRN

## 2013-07-14 NOTE — Telephone Encounter (Signed)
Patient had X-Ray today and would like a call when the results come back.  Patient is very concerned about the results.  If patient's not home, please leave a message on his voice mail.

## 2013-07-14 NOTE — Patient Instructions (Signed)
Good to see you. Take some tramadol as needed. Continue heat and ice like we discussed. I will call you with your xray results.

## 2013-07-14 NOTE — Progress Notes (Signed)
Pre visit review using our clinic review tool, if applicable. No additional management support is needed unless otherwise documented below in the visit note. 

## 2013-07-14 NOTE — Progress Notes (Signed)
SUBJECTIVE:  Ricky Boyd is a 73 y.o. male who complains of right back/rib pain for 3 day(s), positional with bending or lifting, without radiation down the legs. Precipitating factors: was working in his garden, bent over and lifting things. Prior history of back problems: recurrent self limited episodes of low back pain in the past. There is no numbness in the legs.  Patient Active Problem List   Diagnosis Date Noted  . Right foot pain 04/17/2013  . Acute bronchitis 02/27/2013  . Elevated PSA, less than 10 ng/ml 06/27/2012  . Routine general medical examination at a health care facility 06/20/2012  . Impotence 01/28/2012  . GOUT 12/26/2008  . MEMORY LOSS 12/26/2008  . 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 Hearing aid worn   . Memory deficit   . Arthritis    Past Surgical History  Procedure Laterality Date  . Hemorroidectomy    . Tonsillectomy and adenoidectomy    . Colonoscopy     History  Substance Use Topics  . Smoking status: Former Research scientist (life sciences)  . Smokeless tobacco: Former Systems developer  . Alcohol Use: 9.6 oz/week    16 Cans of beer per week     Comment: vodka   Family History  Problem Relation Age of Onset  . Colon cancer Neg Hx    Allergies  Allergen Reactions  . Indocin [Indomethacin] Other (See Comments)    Confusion, short term loss of memory  . Amoxicillin-Pot Clavulanate Other (See Comments)    Bad Dreams  . Erythromycin Ethylsuccinate Other (See Comments)     irritates eyes  . Ibuprofen Other (See Comments)    Confusion on 800mg   Tolerates smaller doses   Current Outpatient Prescriptions on File Prior to Visit  Medication Sig Dispense Refill  . Ascorbic Acid (VITAMIN C) 500 MG CAPS Take 1 capsule by mouth daily.      . colchicine 0.6 MG tablet Take 1 tablet (0.6 mg total) by mouth 2 (two) times daily as needed.  60 tablet  1  . donepezil (ARICEPT) 10  MG tablet Take one tablet by mouth once a day.  30 tablet  3  . fish oil-omega-3 fatty acids 1000 MG capsule Take 1 g by mouth 2 (two) times daily.      Marland Kitchen glucosamine-chondroitin 500-400 MG tablet Take 1 tablet by mouth 2 (two) times daily.      . indomethacin (INDOCIN) 50 MG capsule TAKE 1 CAPSULE BY MOUTH 3 TIMES A DAY WITH FOOD.  30 capsule  0  . Multiple Vitamins-Minerals (MULTIVITAMIN WITH MINERALS) tablet Take 1 tablet by mouth daily.      . saw palmetto 160 MG capsule Take 160 mg by mouth 2 (two) times daily.      . tadalafil (CIALIS) 10 MG tablet Take 1 tablet (10 mg total) by mouth daily as needed.  10 tablet  0   No current facility-administered medications on file prior to visit.   The PMH, PSH, Social History, Family History, Medications, and allergies have been reviewed in [facility], and have been updated if relevant.  OBJECTIVE: BP 114/70  Pulse 60  Temp(Src) 97.7 F (36.5 C) (Oral)  Wt 205 lb 12 oz (93.328 kg)  SpO2 97%  Patient appears to be in mild to moderate pain, antalgic gait noted. Lumbosacral spine area reveals no local tenderness or mass.  Painful and reduced LS ROM noted.  Straight leg raise is negative bilaterally, right rib posteriorly tender to palpation. DTR's, motor strength and sensation normal, including heel and toe gait.  Peripheral pulses are palpable. X-Ray: ordered, but results not yet available.  ASSESSMENT:  Likely MSK but will get xray to rule out bony pathology.  PLAN: For acute pain, rest, intermittent application of heat (do not sleep on heating pad), analgesics and muscle relaxants are recommended.  Tramadol prn severe pain.  Discussed longer term treatment plan of prn NSAID's and discussed a home back care exercise program with flexion exercise routine. Proper lifting with avoidance of heavy lifting discussed. Consider Physical Therapy and XRay studies if not improving. Call or return to clinic prn if these symptoms worsen or fail to improve as  anticipated.

## 2013-07-18 NOTE — Telephone Encounter (Signed)
Per result note, pt informed.

## 2013-07-21 ENCOUNTER — Encounter (HOSPITAL_COMMUNITY): Payer: Self-pay | Admitting: Emergency Medicine

## 2013-07-21 ENCOUNTER — Emergency Department (HOSPITAL_COMMUNITY)
Admission: EM | Admit: 2013-07-21 | Discharge: 2013-07-21 | Disposition: A | Discharge status: Home / Self Care | Payer: Medicare Other | Attending: Emergency Medicine | Admitting: Emergency Medicine

## 2013-07-21 ENCOUNTER — Emergency Department (HOSPITAL_COMMUNITY): Payer: Medicare Other

## 2013-07-21 DIAGNOSIS — M129 Arthropathy, unspecified: Secondary | ICD-10-CM | POA: Insufficient documentation

## 2013-07-21 DIAGNOSIS — Z87891 Personal history of nicotine dependence: Secondary | ICD-10-CM | POA: Insufficient documentation

## 2013-07-21 DIAGNOSIS — M25559 Pain in unspecified hip: Secondary | ICD-10-CM | POA: Insufficient documentation

## 2013-07-21 DIAGNOSIS — Z880 Allergy status to penicillin: Secondary | ICD-10-CM | POA: Insufficient documentation

## 2013-07-21 DIAGNOSIS — R109 Unspecified abdominal pain: Secondary | ICD-10-CM

## 2013-07-21 DIAGNOSIS — Z8546 Personal history of malignant neoplasm of prostate: Secondary | ICD-10-CM | POA: Insufficient documentation

## 2013-07-21 DIAGNOSIS — Z79899 Other long term (current) drug therapy: Secondary | ICD-10-CM | POA: Insufficient documentation

## 2013-07-21 LAB — URINALYSIS, ROUTINE W REFLEX MICROSCOPIC
BILIRUBIN URINE: NEGATIVE
Glucose, UA: NEGATIVE mg/dL
Hgb urine dipstick: NEGATIVE
Ketones, ur: NEGATIVE mg/dL
LEUKOCYTES UA: NEGATIVE
NITRITE: NEGATIVE
Protein, ur: NEGATIVE mg/dL
SPECIFIC GRAVITY, URINE: 1.025 (ref 1.005–1.030)
UROBILINOGEN UA: 0.2 mg/dL (ref 0.0–1.0)
pH: 7 (ref 5.0–8.0)

## 2013-07-21 LAB — COMPREHENSIVE METABOLIC PANEL
ALT: 21 U/L (ref 0–53)
AST: 28 U/L (ref 0–37)
Albumin: 3.8 g/dL (ref 3.5–5.2)
Alkaline Phosphatase: 69 U/L (ref 39–117)
BUN: 25 mg/dL — ABNORMAL HIGH (ref 6–23)
CALCIUM: 9.2 mg/dL (ref 8.4–10.5)
CO2: 27 mEq/L (ref 19–32)
Chloride: 102 mEq/L (ref 96–112)
Creatinine, Ser: 1.25 mg/dL (ref 0.50–1.35)
GFR calc non Af Amer: 55 mL/min — ABNORMAL LOW (ref >90)
GFR, EST AFRICAN AMERICAN: 63 mL/min — AB (ref >90)
GLUCOSE: 102 mg/dL — AB (ref 70–99)
Potassium: 4.6 mEq/L (ref 3.7–5.3)
SODIUM: 139 meq/L (ref 137–147)
Total Bilirubin: 0.6 mg/dL (ref 0.3–1.2)
Total Protein: 6.8 g/dL (ref 6.0–8.3)

## 2013-07-21 LAB — CBC WITH DIFFERENTIAL/PLATELET
BASOS ABS: 0 10*3/uL (ref 0.0–0.1)
Basophils Relative: 0 % (ref 0–1)
EOS ABS: 0.2 10*3/uL (ref 0.0–0.7)
EOS PCT: 3 % (ref 0–5)
HEMATOCRIT: 43.4 % (ref 39.0–52.0)
Hemoglobin: 15.2 g/dL (ref 13.0–17.0)
LYMPHS ABS: 1.4 10*3/uL (ref 0.7–4.0)
Lymphocytes Relative: 20 % (ref 12–46)
MCH: 31.4 pg (ref 26.0–34.0)
MCHC: 35 g/dL (ref 30.0–36.0)
MCV: 89.7 fL (ref 78.0–100.0)
MONO ABS: 0.6 10*3/uL (ref 0.1–1.0)
Monocytes Relative: 9 % (ref 3–12)
Neutro Abs: 4.6 10*3/uL (ref 1.7–7.7)
Neutrophils Relative %: 68 % (ref 43–77)
PLATELETS: 121 10*3/uL — AB (ref 150–400)
RBC: 4.84 MIL/uL (ref 4.22–5.81)
RDW: 13.1 % (ref 11.5–15.5)
WBC: 6.8 10*3/uL (ref 4.0–10.5)

## 2013-07-21 MED ORDER — IBUPROFEN 600 MG PO TABS: 600.0000 mg | ORAL_TABLET | Freq: Three times a day (TID) | ORAL | Status: AC

## 2013-07-21 MED ORDER — HYDROMORPHONE HCL PF 1 MG/ML IJ SOLN
0.5000 mg | Freq: Once | INTRAMUSCULAR | Status: AC
  Administered 2013-07-21: 0.5 mg via INTRAVENOUS
  Filled 2013-07-21: qty 1

## 2013-07-21 MED ORDER — HYDROCODONE-ACETAMINOPHEN 5-325 MG PO TABS: 1.0000 | ORAL_TABLET | Freq: Four times a day (QID) | ORAL | Status: DC | PRN

## 2013-07-21 NOTE — ED Notes (Signed)
Pt reports pain decreased and able to turn on side without being in severe pain.

## 2013-07-21 NOTE — ED Notes (Addendum)
Ricky Boyd at bedside attempting blood draw. Pt encouraged to void for urine sample.

## 2013-07-21 NOTE — ED Notes (Signed)
Pt transported to xray will attempt blood draw with pt return.

## 2013-07-21 NOTE — ED Notes (Signed)
Bed: VZ85 Expected date:  Expected time:  Means of arrival:  Comments: EMS-right sided pain

## 2013-07-21 NOTE — ED Notes (Addendum)
Per EMS pt having right sided pain for 3 weeks. Pt seen by PCP and diagnosed with muscle pain given medication Tramadol. Pt pain unrelieved and worsened today. Pt denies pain without movement but reports 10/10 pain with movement.  Pt reports hx of sciatica and believes gardening and shooting rifle yesterday aggravated sciatica.

## 2013-07-21 NOTE — ED Provider Notes (Signed)
CSN: [id number]     Arrival date & time 07/21/13  9937 History   First MD Initiated Contact with Patient 07/21/13 [phone]     Chief Complaint  Patient presents with  . Rided Sided Pain      (Consider location/radiation/quality/duration/timing/severity/associated sxs/prior Treatment) HPI Patient presents with right sided pain.  Pain has been present for several days.  Is focally about the right superior illiac crest with less severe pain in the R inferior axilla.  No n/v/d/ abd pain. Patient states that the pain is simiar to sciatica pain, but in a different location. No relief w otc meds. No clear worsening factors.    Past Medical History  Diagnosis Date  . Hearing aid worn   . Memory deficit   . Arthritis   . Cancer     Prostate   Past Surgical History  Procedure Laterality Date  . Hemorroidectomy    . Tonsillectomy and adenoidectomy    . Colonoscopy     Family History  Problem Relation Age of Onset  . Colon cancer Neg Hx    History  Substance Use Topics  . Smoking status: Former Research scientist (life sciences)  . Smokeless tobacco: Former Systems developer  . Alcohol Use: 3.6 oz/week    6 Cans of beer per week    Review of Systems  Constitutional:       Per HPI, otherwise negative  HENT:       Per HPI, otherwise negative  Respiratory:       Per HPI, otherwise negative  Cardiovascular:       Per HPI, otherwise negative  Gastrointestinal: Negative for vomiting.  Endocrine:       Negative aside from HPI  Genitourinary:       Neg aside from HPI   Musculoskeletal:       Per HPI, otherwise negative  Skin: Negative.   Neurological: Negative for syncope.      Allergies  Indocin; Amoxicillin-pot clavulanate; Erythromycin ethylsuccinate; and Ibuprofen  Home Medications   Prior to Admission medications   Medication Sig Start Date End Date Taking? Authorizing Provider  Ascorbic Acid (VITAMIN C) 500 MG CAPS Take 500 mg by mouth daily.    Yes Historical Provider, MD  colchicine 0.6 MG tablet  Take 0.6 mg by mouth daily as needed (Gout).   Yes Historical Provider, MD  donepezil (ARICEPT) 10 MG tablet Take 10 mg by mouth daily.   Yes Historical Provider, MD  fish oil-omega-3 fatty acids 1000 MG capsule Take 1 g by mouth 2 (two) times daily.   Yes Historical Provider, MD  glucosamine-chondroitin 500-400 MG tablet Take 1 tablet by mouth 2 (two) times daily.   Yes Historical Provider, MD  ibuprofen (ADVIL,MOTRIN) 200 MG tablet Take 400 mg by mouth every 6 (six) hours as needed for moderate pain.   Yes Historical Provider, MD  Multiple Vitamins-Minerals (MULTIVITAMIN WITH MINERALS) tablet Take 1 tablet by mouth daily.   Yes Historical Provider, MD  saw palmetto 160 MG capsule Take 160 mg by mouth 2 (two) times daily.   Yes Historical Provider, MD  traMADol (ULTRAM) 50 MG tablet Take 50 mg by mouth every 6 (six) hours as needed for moderate pain.   Yes Historical Provider, MD  tadalafil (CIALIS) 10 MG tablet Take 1 tablet (10 mg total) by mouth daily as needed. 01/28/12   Lucille Passy, MD   BP 139/76  Pulse 52  Temp(Src) 97.6 F (36.4 C) (Oral)  Resp 16  SpO2 100% Physical Exam  Nursing note and vitals reviewed. Constitutional: He is oriented to person, place, and time. He appears well-developed. No distress.  HENT:  Head: Normocephalic and atraumatic.  Eyes: Conjunctivae and EOM are normal.  Cardiovascular: Normal rate and regular rhythm.   Pulmonary/Chest: Effort normal. No stridor. No respiratory distress.  Abdominal: He exhibits no distension.  Musculoskeletal: He exhibits no edema.  No deformity, patient elevates each leg independently, with no referred pain to the lower back or the size. Strength is 5/5 extremities. There is no tenderness to palpation about the right superior iliac crest.   Neurological: He is alert and oriented to person, place, and time.  Skin: Skin is warm and dry.  Psychiatric: He has a normal mood and affect.    ED Course  Procedures (including  critical care time) Labs Review Labs Reviewed  CBC WITH DIFFERENTIAL - Abnormal; Notable for the following:    Platelets 121 (*)    All other components within normal limits  COMPREHENSIVE METABOLIC PANEL  URINALYSIS, ROUTINE W REFLEX MICROSCOPIC    Imaging Review Dg Thoracic Spine 2 View  07/21/2013   CLINICAL DATA:  Right-sided thoracic back pain.  Prostate carcinoma.  EXAM: THORACIC SPINE - 2 VIEW  COMPARISON:  None.  FINDINGS: There is no evidence of thoracic spine fracture. Alignment is normal. Mid and lower thoracic spine degenerative changes noted. No focal lytic or sclerotic bone lesions identified.  IMPRESSION: No acute findings.  Mild mid and lower thoracic spine degenerative changes.   Electronically Signed   By: Earle Gell M.D.   On: 07/21/2013 10:11   Dg Lumbar Spine Complete  07/21/2013   CLINICAL DATA:  Mid to low right-sided back pain and right hip pain for 3 weeks. History of prostate cancer.  EXAM: LUMBAR SPINE - COMPLETE 4+ VIEW  COMPARISON:  MRI 11/08/2011 and plain films 01/29/2004  FINDINGS: Minimal convex left lumbar spine curvature. Mild sclerosis about the sacroiliac joints which is likely degenerative and grossly similar to on the prior exam. Maintenance of vertebral body height and alignment. No focal osseous lesion. Mild loss of intervertebral disc height at L4-5. Facet arthropathy which is most advanced at L4 through S1. Anterior osteophytes, including within the lower thoracic and lower lumbar spine.  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  No evidence of osseous metastasis.   Electronically Signed   By: Abigail Miyamoto M.D.   On: 07/21/2013 10:06   Dg Hip Complete Right  07/21/2013   CLINICAL DATA:  Right-sided low back pain and hip pain, 3-4 weeks duration. No known injury. History of prostate cancer  EXAM: RIGHT HIP - COMPLETE 2+ VIEW  COMPARISON:  01/31/2010  FINDINGS: Both hip joints show normal joint space. No degenerative change on the right. No  evidence of avascular necrosis. No osteophyte formation. No evidence of osseous metastatic disease. Sacroiliac joints appear normal. There is ordinary lower lumbar degenerative disease. No significant change since 2011.  IMPRESSION: No evidence of right hip pathology. No evidence of metastatic disease. Ordinary lower lumbar degenerative disease.   Electronically Signed   By: Nelson Chimes M.D.   On: 07/21/2013 10:15   Monitor: 55 sb, abnormal O2- 99%ra, nml  1:10 PM Patient resting, seemingly comfortable. All results d/w patient and wife. He now states that his pain became worse yesterday as he was using a rifle. Return precautions and followup instructions provided  MDM   This generally well now presents with ongoing pain in the right.  Patient has a soft, no peritoneal  abdomen, no nausea, vomiting, diarrhea suggesting intra-abdominal infection.  Patient's labs, x-ray are largely reassuring.  X-rays performed given the patient's history of prostate cancer, currently being monitored. Patient's pain seemed markedly better, as he remained in no distress, seemingly comfortable during his emergency department course the patient was discharged in stable condition to follow up with primary care    Carmin Muskrat, MD 07/21/13 1312

## 2013-07-21 NOTE — Discharge Instructions (Signed)
As discussed, your evaluation today has been largely reassuring.  But, it is important that you monitor your condition carefully, and do not hesitate to return to the ED if you develop new, or concerning changes in your condition. ? ?Otherwise, please follow-up with your physician for appropriate ongoing care. ? ?

## 2013-07-21 NOTE — ED Notes (Signed)
Pt reports moving. With any movement pain 10/10. Pt reports 10/10 pain at present time.

## 2013-07-21 NOTE — ED Notes (Signed)
Pt encouraged to void for urine sample.

## 2013-07-21 NOTE — ED Notes (Signed)
Vanita Panda MD at bedside. Aware of current vital signs and denies need for EKG. Pt denies SOB or CP.

## 2013-07-21 NOTE — ED Notes (Signed)
Pt denies pain at present time. Pt not moving but reports pain with movement.

## 2013-07-25 ENCOUNTER — Other Ambulatory Visit: Payer: Self-pay | Admitting: Family Medicine

## 2013-07-25 NOTE — Telephone Encounter (Signed)
Pt called, wanted to give Dr. Deborra Medina an update on his back condition.  On 07/21/13 he woke up with severe back pain and was transported to [place].  He was also scheduled to see Dr. Ronnald Ramp at [facility] for a follow up visit that same day and had to cancel that appointment due to being in the hospital.  I called Dr. Ronnald Ramp' office and requested that they call him to reschedule his follow up appointment.  Pt would like to know if he can get a prescription for Hydrocodone and 600 mg ibuprofen.   Best number to call pt is [phone].  Pharmacy of choice is [facility].

## 2013-07-25 NOTE — Telephone Encounter (Signed)
ER note reviewed.  Thank you for the update.  Please send in rx for norco as entered below.  Ok to take with ibuprofen but be cautious with this.  Ibuprofen should be taken with food (can cause GI bleed).  Norco causes sedation and constipation and is not meant for long term.  Please make sure he does keep his appt with neuro surg.

## 2013-07-26 MED ORDER — HYDROCODONE-ACETAMINOPHEN 5-325 MG PO TABS
1.0000 | ORAL_TABLET | Freq: Four times a day (QID) | ORAL | Status: DC | PRN
Start: 2013-07-26 — End: 2014-04-02

## 2013-07-26 NOTE — Telephone Encounter (Signed)
Spoke to pt and advised per Dr Deborra Medina and pt verbally expressed understanding. Informed Rx is available for pickup and govt issued photo id required for pickup

## 2013-11-15 ENCOUNTER — Other Ambulatory Visit: Payer: Self-pay | Admitting: Family Medicine

## 2013-12-28 ENCOUNTER — Telehealth: Payer: Self-pay

## 2013-12-28 NOTE — Telephone Encounter (Signed)
LMOVM for pt to call back to schedule annual wellness exam.

## 2014-04-02 ENCOUNTER — Ambulatory Visit (INDEPENDENT_AMBULATORY_CARE_PROVIDER_SITE_OTHER): Payer: 59 | Admitting: Family Medicine

## 2014-04-02 ENCOUNTER — Encounter: Payer: Self-pay | Admitting: Family Medicine

## 2014-04-02 VITALS — BP 120/70 | HR 57 | Temp 98.1°F | Wt 206.8 lb

## 2014-04-02 DIAGNOSIS — R413 Other amnesia: Secondary | ICD-10-CM

## 2014-04-02 DIAGNOSIS — Z23 Encounter for immunization: Secondary | ICD-10-CM

## 2014-04-02 LAB — COMPREHENSIVE METABOLIC PANEL
ALBUMIN: 3.9 g/dL (ref 3.5–5.2)
ALK PHOS: 64 U/L (ref 39–117)
ALT: 18 U/L (ref 0–53)
AST: 25 U/L (ref 0–37)
BILIRUBIN TOTAL: 0.5 mg/dL (ref 0.2–1.2)
BUN: 19 mg/dL (ref 6–23)
CALCIUM: 9 mg/dL (ref 8.4–10.5)
CO2: 28 mEq/L (ref 19–32)
CREATININE: 1.17 mg/dL (ref 0.40–1.50)
Chloride: 104 mEq/L (ref 96–112)
GFR: 64.43 mL/min (ref >60.00)
Glucose, Bld: 83 mg/dL (ref 70–99)
Potassium: 4 mEq/L (ref 3.5–5.1)
Sodium: 139 mEq/L (ref 135–145)
TOTAL PROTEIN: 6.8 g/dL (ref 6.0–8.3)

## 2014-04-02 LAB — TSH: TSH: 3.96 u[IU]/mL (ref 0.35–4.50)

## 2014-04-02 LAB — VITAMIN B12: VITAMIN B 12: 243 pg/mL (ref 211–911)

## 2014-04-02 MED ORDER — DONEPEZIL HCL 10 MG PO TABS: 10.0000 mg | ORAL_TABLET | Freq: Every day | ORAL | Status: DC

## 2014-04-02 NOTE — Progress Notes (Signed)
Pre visit review using our clinic review tool, if applicable. No additional management support is needed unless otherwise documented below in the visit note. 

## 2014-04-02 NOTE — Assessment & Plan Note (Addendum)
>  25 minutes spent in face to face time with patient, >50% spent in counselling or coordination of care. MMSE 29/30 Reassurance provided.  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  Excellent recall- remembered that I have two daughters and that today is his daughter's bday. Advised to take Aricept daily- his son will help him organize his meds to make this easier. Check labs today as well. Orders Placed This Encounter  Procedures  . Flu Vaccine QUAD 36+ mos PF IM (Fluarix Quad PF)  . Pneumococcal conjugate vaccine 13-valent IM  . Vitamin B12  . TSH  . Comprehensive metabolic panel   The patient indicates understanding of these issues and agrees with the plan.

## 2014-04-02 NOTE — Progress Notes (Signed)
Subjective:   Patient ID: Ricky Boyd, male    DOB: 20-Oct-1940, 74 y.o.   MRN: [id number]  Ricky Boyd is a pleasant 74 y.o. year old male who presents to clinic today with his son for  Memory Loss  on 04/02/2014  HPI:  Has been on aricept for many years- initially started by [employer]. He admits to not taking it regularly- does not make him feel bad, he just gets busy and forgets to take it before bed.  Mom had Alzheimers but did not become severe until she was in her 26s.  His son does not think that his memory is really that much worse but a family member told him he was concerned. The other day, he called the family member who is concerned and at the end of the conversation, he thanked the other person for calling even though Ricky Boyd was the one who made the call.  Ricky Boyd immediately recognized his error and did not think anything of it.  Lives alone.  Has no problems completely all of his ADLs and AIDLs.  Cooks for himself, balances his own checkbook.  Drives and never forgets where he lives or names of people he sees often.  Current Outpatient Prescriptions on File Prior to Visit  Medication Sig Dispense Refill  . Ascorbic Acid (VITAMIN C) 500 MG CAPS Take 500 mg by mouth daily.     . colchicine 0.6 MG tablet Take 0.6 mg by mouth daily as needed (Gout).    . fish oil-omega-3 fatty acids 1000 MG capsule Take 1 g by mouth 2 (two) times daily.    Marland Kitchen glucosamine-chondroitin 500-400 MG tablet Take 1 tablet by mouth 2 (two) times daily.    . Multiple Vitamins-Minerals (MULTIVITAMIN WITH MINERALS) tablet Take 1 tablet by mouth daily.    . saw palmetto 160 MG capsule Take 160 mg by mouth 2 (two) times daily.    . tadalafil (CIALIS) 10 MG tablet Take 1 tablet (10 mg total) by mouth daily as needed. 10 tablet 0   No current facility-administered medications on file prior to visit.    Allergies  Allergen Reactions  . Indocin [Indomethacin] Other (See Comments)    Confusion, short  term loss of memory  . Amoxicillin-Pot Clavulanate Other (See Comments)    Bad Dreams  . Erythromycin Ethylsuccinate Other (See Comments)     irritates eyes  . Ibuprofen Other (See Comments)    Confusion on 800mg   Tolerates smaller doses    Past Medical History  Diagnosis Date  . Hearing aid worn   . Memory deficit   . Arthritis   . Cancer     Prostate    Past Surgical History  Procedure Laterality Date  . Hemorroidectomy    . Tonsillectomy and adenoidectomy    . Colonoscopy      Family History  Problem Relation Age of Onset  . Colon cancer Neg Hx     History   Social History  . Marital Status: Single    Spouse Name: N/A    Number of Children: N/A  . Years of Education: N/A   Occupational History  . Not on file.   Social History Main Topics  . Smoking status: Former Research scientist (life sciences)  . Smokeless tobacco: Former Systems developer  . Alcohol Use: 3.6 oz/week    6 Cans of beer per week  . Drug Use: No  . Sexual Activity: Not on file   Other Topics Concern  . Not on file  Social History Narrative   Desires CPR.      Would desire life support and and possibly feeding tube.   The PMH, PSH, Social History, Family History, Medications, and allergies have been reviewed in [facility], and have been updated if relevant.  Review of Systems  Constitutional: Negative.   Neurological: Negative.   Psychiatric/Behavioral: Negative.   All other systems reviewed and are negative.      Objective:    BP 120/70 mmHg  Pulse 57  Temp(Src) 98.1 F (36.7 C) (Oral)  Wt 206 lb 12 oz (93.781 kg)  SpO2 97%   Physical Exam  Constitutional: He is oriented to person, place, and time. He appears well-developed and well-nourished. No distress.  HENT:  Head: Normocephalic and atraumatic.  Eyes: Conjunctivae are normal.  Neck: Normal range of motion.  Cardiovascular: Normal rate.   Pulmonary/Chest: Effort normal.  Musculoskeletal: He exhibits no edema.  Neurological: He is alert and oriented to  person, place, and time. No cranial nerve deficit.  Skin: Skin is warm and dry.  Psychiatric: He has a normal mood and affect. His behavior is normal. Judgment and thought content normal.   mini-mental status exam performed          Assessment & Plan:   Memory loss - Plan: Vitamin B12, TSH, Comprehensive metabolic panel  Need for prophylactic vaccination and inoculation against influenza - Plan: Flu Vaccine QUAD 36+ mos PF IM (Fluarix Quad PF)  Need for prophylactic vaccination against Streptococcus pneumoniae (pneumococcus) - Plan: Pneumococcal conjugate vaccine 13-valent IM No Follow-up on file.

## 2014-04-02 NOTE — Patient Instructions (Signed)
Good to see you. Take your Aricept everyday.  We will call you with your lab results.

## 2014-04-03 ENCOUNTER — Encounter: Payer: Self-pay | Admitting: *Deleted

## 2014-08-09 ENCOUNTER — Encounter: Payer: Self-pay | Admitting: Internal Medicine

## 2014-09-12 ENCOUNTER — Encounter: Payer: Self-pay | Admitting: Family Medicine

## 2014-09-12 ENCOUNTER — Ambulatory Visit (INDEPENDENT_AMBULATORY_CARE_PROVIDER_SITE_OTHER): Payer: Medicare Other | Admitting: Family Medicine

## 2014-09-12 VITALS — BP 148/78 | HR 65 | Temp 97.5°F | Wt 198.2 lb

## 2014-09-12 DIAGNOSIS — R413 Other amnesia: Secondary | ICD-10-CM | POA: Diagnosis not present

## 2014-09-12 NOTE — Progress Notes (Signed)
Pre visit review using our clinic review tool, if applicable. No additional management support is needed unless otherwise documented below in the visit note. 

## 2014-09-12 NOTE — Progress Notes (Signed)
Subjective:   Patient ID: Ricky Boyd, male    DOB: 06-Mar-1940, 74 y.o.   MRN: [id number]  Ricky Boyd is a pleasant 74 y.o. year old male who presents to clinic today with his son for  Memory Loss  on 09/12/2014  HPI:  Has been on aricept for many years- initially started by [employer].  Mom had Alzheimers but did not become severe until she was in her 52s.  His son does not think that his memory was stable until two weeks ago.  The day after his girlfriend left him, his memory became acutely worse.  His son describes him not remembering that he ate lunch with him earlier that day.  Never forgot where he lived or people he knows.  Memory now back to baseline.  Lives alone.  Has no problems completely all of his ADLs and AIDLs.  Cooks for himself, balances his own checkbook.  Drives and never forgets where he lives or names of people he sees often.  Current Outpatient Prescriptions on File Prior to Visit  Medication Sig Dispense Refill  . Ascorbic Acid (VITAMIN C) 500 MG CAPS Take 500 mg by mouth daily.     . colchicine 0.6 MG tablet Take 0.6 mg by mouth daily as needed (Gout).    Marland Kitchen donepezil (ARICEPT) 10 MG tablet Take 1 tablet (10 mg total) by mouth daily. 30 tablet 11  . fish oil-omega-3 fatty acids 1000 MG capsule Take 1 g by mouth 2 (two) times daily.    Marland Kitchen glucosamine-chondroitin 500-400 MG tablet Take 1 tablet by mouth 2 (two) times daily.    . Multiple Vitamins-Minerals (MULTIVITAMIN WITH MINERALS) tablet Take 1 tablet by mouth daily.    Marland Kitchen oxybutynin (DITROPAN) 5 MG tablet Take 5 mg by mouth 3 (three) times daily.    . saw palmetto 160 MG capsule Take 160 mg by mouth 2 (two) times daily.    . tadalafil (CIALIS) 10 MG tablet Take 1 tablet (10 mg total) by mouth daily as needed. 10 tablet 0   No current facility-administered medications on file prior to visit.    Allergies  Allergen Reactions  . Indocin [Indomethacin] Other (See Comments)    Confusion, short term loss  of memory  . Amoxicillin-Pot Clavulanate Other (See Comments)    Bad Dreams  . Erythromycin Ethylsuccinate Other (See Comments)     irritates eyes  . Ibuprofen Other (See Comments)    Confusion on 800mg   Tolerates smaller doses    Past Medical History  Diagnosis Date  . Hearing aid worn   . Memory deficit   . Arthritis   . Cancer     Prostate    Past Surgical History  Procedure Laterality Date  . Hemorroidectomy    . Tonsillectomy and adenoidectomy    . Colonoscopy      Family History  Problem Relation Age of Onset  . Colon cancer Neg Hx     History   Social History  . Marital Status: Single    Spouse Name: N/A  . Number of Children: N/A  . Years of Education: N/A   Occupational History  . Not on file.   Social History Main Topics  . Smoking status: Former Research scientist (life sciences)  . Smokeless tobacco: Former Systems developer  . Alcohol Use: 3.6 oz/week    6 Cans of beer per week  . Drug Use: No  . Sexual Activity: Not on file   Other Topics Concern  . Not on file  Social History Narrative   Desires CPR.      Would desire life support and and possibly feeding tube.   The PMH, PSH, Social History, Family History, Medications, and allergies have been reviewed in [facility], and have been updated if relevant.  Review of Systems  Constitutional: Negative.   Neurological: Negative.   Psychiatric/Behavioral: Negative.   All other systems reviewed and are negative.      Objective:    BP 148/78 mmHg  Pulse 65  Temp(Src) 97.5 F (36.4 C) (Oral)  Wt 198 lb 4 oz (89.926 kg)  SpO2 97%   Physical Exam  Constitutional: He is oriented to person, place, and time. He appears well-developed and well-nourished. No distress.  HENT:  Head: Normocephalic and atraumatic.  Eyes: Conjunctivae are normal.  Neck: Normal range of motion.  Cardiovascular: Normal rate.   Pulmonary/Chest: Effort normal.  Musculoskeletal: He exhibits no edema.  Neurological: He is alert and oriented to person,  place, and time. No cranial nerve deficit.  Skin: Skin is warm and dry.  Psychiatric: He has a normal mood and affect. His behavior is normal. Judgment and thought content normal.   mini-mental status exam performed          Assessment & Plan:   Memory loss - Plan: Ambulatory referral to Neurology No Follow-up on file.

## 2014-09-12 NOTE — Assessment & Plan Note (Signed)
>  25 minutes spent in face to face time with patient, >50% spent in counselling or coordination of care ?component of pseudodementia now resolving. No change made to aricept dose.  Refer to neurology as it would be good to have a specialist involved as his dementia progresses. The patient indicates understanding of these issues and agrees with the plan.

## 2014-09-12 NOTE — Patient Instructions (Signed)
Good to see you. We will call you with your neurology appointment.

## 2014-09-27 ENCOUNTER — Encounter: Payer: Self-pay | Admitting: Family Medicine

## 2014-09-27 ENCOUNTER — Ambulatory Visit (INDEPENDENT_AMBULATORY_CARE_PROVIDER_SITE_OTHER)
Admission: RE | Admit: 2014-09-27 | Discharge: 2014-09-27 | Disposition: A | Discharge status: Home / Self Care | Payer: Medicare Other | Source: Ambulatory Visit | Attending: Family Medicine | Admitting: Family Medicine

## 2014-09-27 ENCOUNTER — Ambulatory Visit (INDEPENDENT_AMBULATORY_CARE_PROVIDER_SITE_OTHER): Payer: Medicare Other | Admitting: Family Medicine

## 2014-09-27 VITALS — BP 130/80 | HR 61 | Temp 97.3°F | Ht 68.0 in | Wt 199.8 lb

## 2014-09-27 DIAGNOSIS — M25562 Pain in left knee: Secondary | ICD-10-CM

## 2014-09-27 DIAGNOSIS — M1712 Unilateral primary osteoarthritis, left knee: Secondary | ICD-10-CM

## 2014-09-27 MED ORDER — METHYLPREDNISOLONE ACETATE 40 MG/ML IJ SUSP
80.0000 mg | Freq: Once | INTRAMUSCULAR | Status: AC
  Administered 2014-09-27: 80 mg via INTRA_ARTICULAR

## 2014-09-27 NOTE — Progress Notes (Signed)
Dr. Frederico Hamman T. Arieon Corcoran, MD, [facility] [place], [address] Phone: [phone] Fax: [phone]  09/27/2014  Patient: Ricky Boyd, MRN: [id number], DOB: 1940-09-16, 74 y.o.  Primary Physician:  Arnette Norris, MD  Chief Complaint: Knee Pain  Subjective:   Ricky Boyd is a 74 y.o. very pleasant male patient who presents with the following:  Woke up one morning and felt a twinge about two weeks ago. Picked up a peck of beans about 2 weeks ago. No prior surgeries. Other than this, he has not really had any other particular stresses on his knee.  It has swollen mildly, but he has not any kind trauma or accident all he can think of.  Knee not swelling up some. Unsure about stairs.  Ice and ibuprofen have helped some.   Past Medical History, Surgical History, Social History, Family History, Problem List, Medications, and Allergies have been reviewed and updated if relevant.  Patient Active Problem List   Diagnosis Date Noted  . Right foot pain 04/17/2013  . Acute bronchitis 02/27/2013  . Elevated PSA, less than 10 ng/ml 06/27/2012  . Routine general medical examination at a health care facility 06/20/2012  . Impotence 01/28/2012  . GOUT 12/26/2008  . MEMORY LOSS 12/26/2008  . 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 Hearing aid worn   . Memory deficit   . Arthritis   . Cancer     Prostate    Past Surgical History  Procedure Laterality Date  . Hemorroidectomy    . Tonsillectomy and adenoidectomy    . Colonoscopy      History   Social History  . Marital Status: Single    Spouse Name: N/A  . Number of Children: N/A  . Years of Education: N/A   Occupational History  . Not on file.   Social History Main Topics  . Smoking status: Former Research scientist (life sciences)  . Smokeless tobacco: Former Systems developer  . Alcohol Use:  3.6 oz/week    6 Cans of beer per week  . Drug Use: No  . Sexual Activity: Not on file   Other Topics Concern  . Not on file   Social History Narrative   Desires CPR.      Would desire life support and and possibly feeding tube.    Family History  Problem Relation Age of Onset  . Colon cancer Neg Hx     Allergies  Allergen Reactions  . Indocin [Indomethacin] Other (See Comments)    Confusion, short term loss of memory  . Amoxicillin-Pot Clavulanate Other (See Comments)    Bad Dreams  . Erythromycin Ethylsuccinate Other (See Comments)     irritates eyes  . Ibuprofen Other (See Comments)    Confusion on 800mg   Tolerates smaller doses    Medication list reviewed and updated in full in [place].  GEN: No fevers, chills. Nontoxic. Primarily MSK c/o today. MSK: Detailed in the HPI GI: tolerating PO intake without difficulty Neuro: No numbness, parasthesias, or tingling associated. Otherwise the pertinent positives of the ROS are noted above.   Objective:   BP 130/80 mmHg  Pulse 61  Temp(Src) 97.3 F (36.3 C) (Oral)  Ht 5\' 8"  (1.727 m)  Wt 199 lb 12 oz (90.606 kg)  BMI 30.38 kg/m2   GEN: WDWN, NAD, Non-toxic, Alert & Oriented  x 3 HEENT: Atraumatic, Normocephalic.  Ears and Nose: No external deformity. EXTR: No clubbing/cyanosis/edema NEURO: Normal gait.  PSYCH: Normally interactive. Conversant. Not depressed or anxious appearing.  Calm demeanor.   Knee:  L Gait: Normal heel toe pattern ROM: 0-115 Effusion: neg Echymosis or edema: none Patellar tendon NT Painful PLICA: neg Patellar grind: negative Medial and lateral patellar facet loading: negative medial and lateral joint lines: mild medial and lateral joint line pain Mcmurray's neg Flexion-pinch apin Varus and valgus stress: stable Lachman: neg Ant and Post drawer: neg Hip abduction, IR, ER: WNL Hip flexion str: 5/5 Hip abd: 5/5 Quad: 5/5 VMO atrophy:No Hamstring concentric and eccentric:  5/5   Radiology: Dg Knee Ap/lat W/sunrise Left  09/27/2014   CLINICAL DATA:  Left knee pain for 2 weeks. Patient states his knee started suddenly hurting in the morning when he stepped out of bed. No history of trauma.  EXAM: LEFT KNEE 3 VIEWS  COMPARISON:  MRI of the left knee dated 09/28/2003  FINDINGS: There is no evidence of fracture, or dislocation. There is a small suprapatellar joint effusion. There is mild 3 compartment osteoarthritis more prominent in the medial and patellofemoral compartment with joint space narrowing, subchondral sclerosis and mild osteophytosis. Soft tissues are unremarkable.  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  Small joint effusion.   Electronically Signed   By: Fidela Salisbury M.D.   On: 09/27/2014 09:14     Assessment and Plan:   Left knee pain - Plan: DG Knee AP/LAT W/Sunrise Left, methylPREDNISolone acetate (DEPO-MEDROL) injection 80 mg  OA exacerbation versus degenerative meniscal tear.  Continue with conservative management, eyes, NSAIDs.  Rest appropriately for the next 1-2 months.  If symptoms return and we can reassess.  Knee Injection, LEFT Patient verbally consented to procedure. Risks (including potential rare risk of infection), benefits, and alternatives explained. Sterilely prepped with Chloraprep. Ethyl cholride used for anesthesia. 8 cc Lidocaine 1% mixed with Depo-Medrol 80 mg injected using the anteromedial approach without difficulty. No complications with procedure and tolerated well. Patient had decreased pain post-injection.   Follow-up: 4-6 weeks if continued probs  New Prescriptions   No medications on file   Orders Placed This Encounter  Procedures  . DG Knee AP/LAT W/Sunrise Left    Signed,  Keston Seever T. Dylann Layne, MD   Patient's Medications  New Prescriptions   No medications on file  Previous Medications   ASCORBIC ACID (VITAMIN C) 500 MG CAPS    Take 500 mg by mouth daily.    COLCHICINE 0.6 MG  TABLET    Take 0.6 mg by mouth daily as needed (Gout).   DONEPEZIL (ARICEPT) 10 MG TABLET    Take 1 tablet (10 mg total) by mouth daily.   FISH OIL-OMEGA-3 FATTY ACIDS 1000 MG CAPSULE    Take 1 g by mouth 2 (two) times daily.   GLUCOSAMINE-CHONDROITIN 500-400 MG TABLET    Take 1 tablet by mouth 2 (two) times daily.   MULTIPLE VITAMINS-MINERALS (MULTIVITAMIN WITH MINERALS) TABLET    Take 1 tablet by mouth daily.   SAW PALMETTO 160 MG CAPSULE    Take 160 mg by mouth 2 (two) times daily.  Modified Medications   No medications on file  Discontinued Medications   OXYBUTYNIN (DITROPAN) 5 MG TABLET    Take 5 mg by mouth 3 (three) times daily.   TADALAFIL (CIALIS) 10 MG TABLET    Take 1 tablet (10 mg total) by mouth daily as needed.

## 2014-09-27 NOTE — Progress Notes (Signed)
Pre visit review using our clinic review tool, if applicable. No additional management support is needed unless otherwise documented below in the visit note. 

## 2014-10-11 ENCOUNTER — Ambulatory Visit (INDEPENDENT_AMBULATORY_CARE_PROVIDER_SITE_OTHER): Payer: Medicare Other | Admitting: Family Medicine

## 2014-10-11 ENCOUNTER — Encounter: Payer: Self-pay | Admitting: Family Medicine

## 2014-10-11 VITALS — BP 120/70 | HR 62 | Temp 97.4°F | Ht 68.0 in | Wt 196.8 lb

## 2014-10-11 DIAGNOSIS — M1712 Unilateral primary osteoarthritis, left knee: Secondary | ICD-10-CM | POA: Diagnosis not present

## 2014-10-11 DIAGNOSIS — M25562 Pain in left knee: Secondary | ICD-10-CM | POA: Diagnosis not present

## 2014-10-11 NOTE — Patient Instructions (Signed)

## 2014-10-11 NOTE — Progress Notes (Signed)
Pre visit review using our clinic review tool, if applicable. No additional management support is needed unless otherwise documented below in the visit note. 

## 2014-10-11 NOTE — Progress Notes (Signed)
Dr. Frederico Hamman T. Saturnino Liew, MD, [facility] [place], [address] Phone: [phone] Fax: [phone]  10/11/2014  Patient: Ricky Boyd, MRN: [id number], DOB: 1940-04-19, 73 y.o.  Primary Physician:  Arnette Norris, MD  Chief Complaint: Follow-up  Subjective:   Ricky Boyd is a 74 y.o. very pleasant male patient who presents with the following:  Not able to squat down.  2 weeks, has been complaining all the time.  He is accompanied by his sister. In total the patient has had symptoms for approximately 5 weeks, and he has been failing traditional conservative care. He has tried and been on oral and inflammatory's, Tylenol, and also we did an intra-articular knee injection 2 weeks ago. He is continued to have symptoms all the time when he is flexing his knee, and he did not have these prior to his initial incident about 5 weeks ago.  09/27/2014 Last OV with Owens Loffler, MD  Woke up one morning and felt a twinge about two weeks ago. Picked up a peck of beans about 2 weeks ago. No prior surgeries. Other than this, he has not really had any other particular stresses on his knee.  It has swollen mildly, but he has not any kind trauma or accident all he can think of.  Knee not swelling up some. Unsure about stairs.  Ice and ibuprofen have helped some.   Past Medical History, Surgical History, Social History, Family History, Problem List, Medications, and Allergies have been reviewed and updated if relevant.  Patient Active Problem List   Diagnosis Date Noted  . Right foot pain 04/17/2013  . Acute bronchitis 02/27/2013  . Elevated PSA, less than 10 ng/ml 06/27/2012  . Routine general medical examination at a health care facility 06/20/2012  . Impotence 01/28/2012  . GOUT 12/26/2008  . MEMORY LOSS 12/26/2008  . 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 Hearing aid worn   . Memory deficit   . Arthritis   . Cancer     Prostate    Past Surgical History  Procedure Laterality Date  . Hemorroidectomy    . Tonsillectomy and adenoidectomy    . Colonoscopy      Social History   Social History  . Marital Status: Single    Spouse Name: N/A  . Number of Children: N/A  . Years of Education: N/A   Occupational History  . Not on file.   Social History Main Topics  . Smoking status: Former Research scientist (life sciences)  . Smokeless tobacco: Former Systems developer  . Alcohol Use: 3.6 oz/week    6 Cans of beer per week  . Drug Use: No  . Sexual Activity: Not on file   Other Topics Concern  . Not on file   Social History Narrative   Desires CPR.      Would desire life support and and possibly feeding tube.    Family History  Problem Relation Age of Onset  . Colon cancer Neg Hx     Allergies  Allergen Reactions  . Indocin [Indomethacin] Other (See Comments)    Confusion, short term loss of memory  . Amoxicillin-Pot Clavulanate Other (See Comments)    Bad Dreams  . Erythromycin Ethylsuccinate Other (See Comments)     irritates eyes  . Ibuprofen Other (See Comments)    Confusion on 800mg   Tolerates smaller doses    Medication list reviewed and updated in full in [facility].  GEN: No fevers, chills. Nontoxic. Primarily MSK c/o today. MSK: Detailed in the HPI GI: tolerating PO intake without difficulty Neuro: No numbness, parasthesias, or tingling associated. Otherwise the pertinent positives of the ROS are noted above.   Objective:   BP 120/70 mmHg  Pulse 62  Temp(Src) 97.4 F (36.3 C) (Oral)  Ht 5\' 8"  (1.727 m)  Wt 196 lb 12 oz (89.245 kg)  BMI 29.92 kg/m2   GEN: WDWN, NAD, Non-toxic, Alert & Oriented x 3 HEENT: Atraumatic, Normocephalic.  Ears and Nose: No external deformity. EXTR: No clubbing/cyanosis/edema NEURO: Normal gait.  PSYCH: Normally interactive. Conversant. Not depressed or anxious  appearing.  Calm demeanor.   Knee:  L Gait: Normal heel toe pattern ROM: 0-115 Effusion: neg Echymosis or edema: none Patellar tendon NT Painful PLICA: neg Patellar grind: negative Medial and lateral patellar facet loading: negative medial and lateral joint lines: mild medial and lateral joint line pain Mcmurray's + for pain Flexion-pinch POS Varus and valgus stress: stable Lachman: neg Ant and Post drawer: neg Hip abduction, IR, ER: WNL Hip flexion str: 5/5 Hip abd: 5/5 Quad: 5/5 VMO atrophy:No Hamstring concentric and eccentric: 5/5   Radiology: Dg Knee Ap/lat W/sunrise Left  09/27/2014   CLINICAL DATA:  Left knee pain for 2 weeks. Patient states his knee started suddenly hurting in the morning when he stepped out of bed. No history of trauma.  EXAM: LEFT KNEE 3 VIEWS  COMPARISON:  MRI of the left knee dated 09/28/2003  FINDINGS: There is no evidence of fracture, or dislocation. There is a small suprapatellar joint effusion. There is mild 3 compartment osteoarthritis more prominent in the medial and patellofemoral compartment with joint space narrowing, subchondral sclerosis and mild osteophytosis. Soft tissues are unremarkable.  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  Small joint effusion.   Electronically Signed   By: Fidela Salisbury M.D.   On: 09/27/2014 09:14     Assessment and Plan:   Left knee pain - Plan: MR Knee Left  Wo Contrast  Primary osteoarthritis of left knee  Failure to improve with traditional conservative management 5 weeks in the setting of a 74 year old with only mild osteoarthritic change of the left knee. Obtain an MRI of the left knee to evaluate for primary concern of meniscal pathology or other occult internal derangement. Flexion pinch testing and McMurray's testing are both positive.  Orders Placed This Encounter  Procedures  . MR Knee Left  Wo Contrast    Signed,  Chesnie Capell T. Kanitra Purifoy, MD   Patient's Medications    New Prescriptions   No medications on file  Previous Medications   ASCORBIC ACID (VITAMIN C) 500 MG CAPS    Take 500 mg by mouth daily.    COLCHICINE 0.6 MG TABLET    Take 0.6 mg by mouth daily as needed (Gout).   DONEPEZIL (ARICEPT) 10 MG TABLET    Take 1 tablet (10 mg total) by mouth daily.   FISH OIL-OMEGA-3 FATTY ACIDS 1000 MG CAPSULE    Take 1 g by mouth 2 (two) times daily.   GLUCOSAMINE-CHONDROITIN 500-400 MG TABLET    Take 1 tablet by mouth 2 (two) times daily.   MULTIPLE VITAMINS-MINERALS (MULTIVITAMIN WITH MINERALS) TABLET    Take 1 tablet by mouth daily.   SAW PALMETTO 160 MG CAPSULE    Take 160 mg by mouth 2 (two) times  daily.  Modified Medications   No medications on file  Discontinued Medications   No medications on file

## 2014-10-15 ENCOUNTER — Other Ambulatory Visit: Payer: Medicare Other

## 2014-10-18 ENCOUNTER — Ambulatory Visit
Admission: RE | Admit: 2014-10-18 | Discharge: 2014-10-18 | Disposition: A | Discharge status: Home / Self Care | Payer: Medicare Other | Source: Ambulatory Visit | Attending: Family Medicine | Admitting: Family Medicine

## 2014-10-18 DIAGNOSIS — M25562 Pain in left knee: Secondary | ICD-10-CM

## 2014-10-24 ENCOUNTER — Encounter: Payer: Self-pay | Admitting: Family Medicine

## 2014-10-24 ENCOUNTER — Ambulatory Visit (INDEPENDENT_AMBULATORY_CARE_PROVIDER_SITE_OTHER): Payer: Medicare Other | Admitting: Family Medicine

## 2014-10-24 VITALS — Ht 68.0 in

## 2014-10-24 DIAGNOSIS — M25562 Pain in left knee: Secondary | ICD-10-CM

## 2014-10-24 DIAGNOSIS — M76899 Other specified enthesopathies of unspecified lower limb, excluding foot: Secondary | ICD-10-CM

## 2014-10-24 DIAGNOSIS — M769 Unspecified enthesopathy, lower limb, excluding foot: Secondary | ICD-10-CM | POA: Diagnosis not present

## 2014-10-24 NOTE — Progress Notes (Signed)
Pre visit review using our clinic review tool, if applicable. No additional management support is needed unless otherwise documented below in the visit note. 

## 2014-10-24 NOTE — Progress Notes (Signed)
Dr. Frederico Hamman T. Nisaiah Bechtol, MD, [facility] [place], [address] Phone: [phone] Fax: [phone]  10/24/2014  Patient: Ricky Boyd, MRN: [id number], DOB: 05-31-1940, 74 y.o.  Primary Physician:  Arnette Norris, MD  Chief Complaint: Follow-up  Subjective:   Ricky Boyd is a 74 y.o. very pleasant male patient who presents with the following:  Doing dramatically better.   I had the patient come in for recheck and to go over his MRI of his left knee. He had some intrasubstance tearing and distal tendinopathy of the quadriceps tendon as the primary finding without evidence of significant internal derangement.  He is much improved compared to the last time we evaluated him, and he is really having only minimal pain with significant exertion.  10/11/2014 Last OV with Owens Loffler, MD  Not able to squat down.  2 weeks, has been complaining all the time.  He is accompanied by his sister. In total the patient has had symptoms for approximately 5 weeks, and he has been failing traditional conservative care. He has tried and been on oral and inflammatory's, Tylenol, and also we did an intra-articular knee injection 2 weeks ago. He is continued to have symptoms all the time when he is flexing his knee, and he did not have these prior to his initial incident about 5 weeks ago.  09/27/2014 Last OV with Owens Loffler, MD  Woke up one morning and felt a twinge about two weeks ago. Picked up a peck of beans about 2 weeks ago. No prior surgeries. Other than this, he has not really had any other particular stresses on his knee.  It has swollen mildly, but he has not any kind trauma or accident all he can think of.  Knee not swelling up some. Unsure about stairs.  Ice and ibuprofen have helped some.   Past Medical History, Surgical History, Social History, Family History, Problem List, Medications, and Allergies have been reviewed and updated  if relevant.  Patient Active Problem List   Diagnosis Date Noted  . Right foot pain 04/17/2013  . Acute bronchitis 02/27/2013  . Elevated PSA, less than 10 ng/ml 06/27/2012  . Routine general medical examination at a health care facility 06/20/2012  . Impotence 01/28/2012  . GOUT 12/26/2008  . MEMORY LOSS 12/26/2008  . 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 Hearing aid worn   . Memory deficit   . Arthritis   . Cancer     Prostate    Past Surgical History  Procedure Laterality Date  . Hemorroidectomy    . Tonsillectomy and adenoidectomy    . Colonoscopy      Social History   Social History  . Marital Status: Single    Spouse Name: N/A  . Number of Children: N/A  . Years of Education: N/A   Occupational History  . Not on file.   Social History Main Topics  . Smoking status: Former Research scientist (life sciences)  . Smokeless tobacco: Former Systems developer  . Alcohol Use: 3.6 oz/week    6 Cans of beer per week  . Drug Use: No  . Sexual Activity: Not on file   Other Topics Concern  . Not on file   Social History Narrative   Desires CPR.      Would desire life support and and possibly feeding tube.  Family History  Problem Relation Age of Onset  . Colon cancer Neg Hx     Allergies  Allergen Reactions  . Indocin [Indomethacin] Other (See Comments)    Confusion, short term loss of memory  . Amoxicillin-Pot Clavulanate Other (See Comments)    Bad Dreams  . Erythromycin Ethylsuccinate Other (See Comments)     irritates eyes  . Ibuprofen Other (See Comments)    Confusion on 800mg   Tolerates smaller doses    Medication list reviewed and updated in full in [facility].  GEN: No fevers, chills. Nontoxic. Primarily MSK c/o today. MSK: Detailed in the HPI GI: tolerating PO intake without difficulty Neuro: No numbness, parasthesias, or tingling associated. Otherwise  the pertinent positives of the ROS are noted above.   Objective:   Ht 5\' 8"  (1.727 m)   GEN: WDWN, NAD, Non-toxic, Alert & Oriented x 3 HEENT: Atraumatic, Normocephalic.  Ears and Nose: No external deformity. EXTR: No clubbing/cyanosis/edema NEURO: Normal gait.  PSYCH: Normally interactive. Conversant. Not depressed or anxious appearing.  Calm demeanor.   Knee:  L Gait: Normal heel toe pattern ROM: 0-120 Effusion: neg Echymosis or edema: none Patellar tendon NT Painful PLICA: neg Patellar grind: negative Medial and lateral patellar facet loading: negative medial and lateral joint lines: mild medial and lateral joint line pain Mcmurray's neg Flexion-pinch neg Varus and valgus stress: stable Lachman: neg Ant and Post drawer: neg Hip abduction, IR, ER: WNL Hip flexion str: 5/5 Hip abd: 5/5 Quad: 5/5 VMO atrophy:No Hamstring concentric and eccentric: 5/5   Radiology: Mr Knee Left  Wo Contrast  10/18/2014   CLINICAL DATA:  Severe anterior knee pain for 3 weeks.  EXAM: MRI OF THE LEFT KNEE WITHOUT CONTRAST  TECHNIQUE: Multiplanar, multisequence MR imaging of the knee was performed. No intravenous contrast was administered.  COMPARISON:  Radiographs dated 09/27/2014  FINDINGS: MENISCI  Medial meniscus:  Normal.  Lateral meniscus:  Normal.  LIGAMENTS  Cruciates:  Normal.  Collaterals:  Normal.  CARTILAGE  Patellofemoral: Small focal area of grade 4 chondromalacia of the medial facet of the patella.  Medial: Small fissures in the cartilage of the medial femoral condyle.  Lateral:  Normal.  Joint:  Normal.  No joint effusion.  Popliteal Fossa:  Tiny Baker's cyst.  Extensor Mechanism: Focal degenerative changes of the distal quadriceps tendon with a small intrasubstance tear best seen on image 14 of series 7 just above the insertion on the patella. Patellar tendon is normal.  Bones:  Normal.  IMPRESSION: 1. Focal intrasubstance tear of the distal quadriceps tendon just proximal to the  insertion on the patella. 2. Minimal chondromalacia of the patella and of the medial femoral condyle.   Electronically Signed   By: Lorriane Shire M.D.   On: 10/18/2014 14:03   Dg Knee Ap/lat W/sunrise Left  09/27/2014   CLINICAL DATA:  Left knee pain for 2 weeks. Patient states his knee started suddenly hurting in the morning when he stepped out of bed. No history of trauma.  EXAM: LEFT KNEE 3 VIEWS  COMPARISON:  MRI of the left knee dated 09/28/2003  FINDINGS: There is no evidence of fracture, or dislocation. There is a small suprapatellar joint effusion. There is mild 3 compartment osteoarthritis more prominent in the medial and patellofemoral compartment with joint space narrowing, subchondral sclerosis and mild osteophytosis. Soft tissues are unremarkable.  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  Small joint effusion.   Electronically Signed   By: Thomas Hoff  Dimitrova M.D.   On: 09/27/2014 09:14     Assessment and Plan:   Quadriceps tendinitis  Left knee pain  He has done well, and I suspect that his partial intrasubstance tear has healed Purcell Nails own. He is completely pain-free on exam. Continue to be active and follow-up as needed.  Follow-up: prn  Signed,  Eustace Hur T. Angelize Ryce, MD   Patient's Medications  New Prescriptions   No medications on file  Previous Medications   ASCORBIC ACID (VITAMIN C) 500 MG CAPS    Take 500 mg by mouth daily.    COLCHICINE 0.6 MG TABLET    Take 0.6 mg by mouth daily as needed (Gout).   DONEPEZIL (ARICEPT) 10 MG TABLET    Take 1 tablet (10 mg total) by mouth daily.   FISH OIL-OMEGA-3 FATTY ACIDS 1000 MG CAPSULE    Take 1 g by mouth 2 (two) times daily.   GLUCOSAMINE-CHONDROITIN 500-400 MG TABLET    Take 1 tablet by mouth 2 (two) times daily.   MULTIPLE VITAMINS-MINERALS (MULTIVITAMIN WITH MINERALS) TABLET    Take 1 tablet by mouth daily.   OXYBUTYNIN (DITROPAN) 5 MG TABLET    Take 5 mg by mouth daily.    SAW PALMETTO 160 MG  CAPSULE    Take 160 mg by mouth 2 (two) times daily.  Modified Medications   No medications on file  Discontinued Medications   No medications on file

## 2014-11-05 ENCOUNTER — Ambulatory Visit (INDEPENDENT_AMBULATORY_CARE_PROVIDER_SITE_OTHER): Payer: Medicare Other | Admitting: Neurology

## 2014-11-05 ENCOUNTER — Encounter: Payer: Self-pay | Admitting: Neurology

## 2014-11-05 VITALS — BP 112/68 | HR 69 | Temp 97.5°F | Resp 15 | Ht 69.29 in | Wt 201.3 lb

## 2014-11-05 DIAGNOSIS — G3184 Mild cognitive impairment, so stated: Secondary | ICD-10-CM

## 2014-11-05 NOTE — Patient Instructions (Addendum)
I think you have mild cognitive impairment 1.  I would continue the Aricept daily 2.  We will check an MRI of the brain without contrast 3.  Consider using a DBS device when you drive. 4.  Follow up in 9 months to recheck memory.

## 2014-11-05 NOTE — Progress Notes (Signed)
NEUROLOGY CONSULTATION NOTE  Ricky Boyd MRN: [id number] DOB: Mar 12, 1940  Referring provider: Dr. Deborra Medina Primary care provider: Dr. Deborra Medina  Reason for consult:  Memory deficits.  HISTORY OF PRESENT ILLNESS: Ricky Boyd is a 74 year old left-handed male with elevated PSA who presents for memory deficits.  He is accompanied by his brother who provides some history.  He used to work as a Pharmacist, hospital for various subjects, including English, Social Studies and Dietitian.  He has a master's degree.    He began having short-term memory problems around 2014.  There has been a little gradual progression.  At first he noticed difficulty remembering names of friends from [place].  He will sometimes not remember the day of the week.  He frequently forgets conversations or recent activities from the previous few days.  He reports that he forgets to take his medication, although he only takes one medication daily.  He doesn't get disoriented when driving, but he will forget for a moment where he wanted to go.  He doesn't repeat questions.  He lives by himself.  He is able to pay the bills and manage his finances.  He is able to perform all activities of daily living.  He currently is not depressed, but he did feel depressed a couple of months ago when he broke up with his girlfriend.  He has been on Aricept for about a year.  He is a former smoker.  He has no history of stroke or head injury.  He says there is no family history but notes report that his mother had Alzheimer's dementia.  Labs from February 2016 include B12 243 and TSH 3.96.    PAST MEDICAL HISTORY: Past Medical History  Diagnosis Date  . Hearing aid worn   . Memory deficit   . Arthritis   . Cancer     Prostate    PAST SURGICAL HISTORY: Past Surgical History  Procedure Laterality Date  . Hemorroidectomy    . Tonsillectomy and adenoidectomy    . Colonoscopy      MEDICATIONS: Current Outpatient Prescriptions on File  Prior to Visit  Medication Sig Dispense Refill  . Ascorbic Acid (VITAMIN C) 500 MG CAPS Take 500 mg by mouth daily.     . colchicine 0.6 MG tablet Take 0.6 mg by mouth daily as needed (Gout).    Marland Kitchen donepezil (ARICEPT) 10 MG tablet Take 1 tablet (10 mg total) by mouth daily. 30 tablet 11  . fish oil-omega-3 fatty acids 1000 MG capsule Take 1 g by mouth 2 (two) times daily.    Marland Kitchen glucosamine-chondroitin 500-400 MG tablet Take 1 tablet by mouth 2 (two) times daily.    . Multiple Vitamins-Minerals (MULTIVITAMIN WITH MINERALS) tablet Take 1 tablet by mouth daily.    Marland Kitchen oxybutynin (DITROPAN) 5 MG tablet Take 5 mg by mouth daily.     . saw palmetto 160 MG capsule Take 160 mg by mouth 2 (two) times daily.     No current facility-administered medications on file prior to visit.    ALLERGIES: Allergies  Allergen Reactions  . Indocin [Indomethacin] Other (See Comments)    Confusion, short term loss of memory  . Amoxicillin-Pot Clavulanate Other (See Comments)    Bad Dreams  . Erythromycin Ethylsuccinate Other (See Comments)     irritates eyes  . Ibuprofen Other (See Comments)    Confusion on 800mg   Tolerates smaller doses    FAMILY HISTORY: Family History  Problem Relation Age of Onset  .  Colon cancer Neg Hx   . Diverticulitis Father   . Depression Brother     SOCIAL HISTORY: Social History   Social History  . Marital Status: Single    Spouse Name: N/A  . Number of Children: 3  . Years of Education: Master's   Occupational History  . Not on file.   Social History Main Topics  . Smoking status: Former Research scientist (life sciences)  . Smokeless tobacco: Former Systems developer  . Alcohol Use: 3.6 oz/week    6 Cans of beer per week     Comment: occasionally  . Drug Use: No  . Sexual Activity: Not on file   Other Topics Concern  . Not on file   Social History Narrative   Desires CPR.      Would desire life support and and possibly feeding tube.      Patient currently lives alone- 2nd wife killed herself.  Lives in one story house.    Patient is left handed.   Patient has a Scientist, water quality.     REVIEW OF SYSTEMS: Constitutional: No fevers, chills, or sweats, no generalized fatigue, change in appetite Eyes: No visual changes, double vision, eye pain Ear, nose and throat: No hearing loss, ear pain, nasal congestion, sore throat Cardiovascular: No chest pain, palpitations Respiratory:  No shortness of breath at rest or with exertion, wheezes GastrointestinaI: No nausea, vomiting, diarrhea, abdominal pain, fecal incontinence Genitourinary:  No dysuria, urinary retention or frequency Musculoskeletal:  No neck pain, back pain Integumentary: No rash, pruritus, skin lesions Neurological: as above Psychiatric: No depression, insomnia, anxiety Endocrine: No palpitations, fatigue, diaphoresis, mood swings, change in appetite, change in weight, increased thirst Hematologic/Lymphatic:  No anemia, purpura, petechiae. Allergic/Immunologic: no itchy/runny eyes, nasal congestion, recent allergic reactions, rashes  PHYSICAL EXAM: Filed Vitals:   11/05/14 0945  BP: 112/68  Pulse: 69  Temp: 97.5 F (36.4 C)  Resp: 15   General: No acute distress.  Patient appears well-groomed.  Head:  Normocephalic/atraumatic Eyes:  fundi unremarkable, without vessel changes, exudates, hemorrhages or papilledema. Neck: supple, no paraspinal tenderness, full range of motion Back: No paraspinal tenderness Heart: regular rate and rhythm Lungs: Clear to auscultation bilaterally. Vascular: No carotid bruits. Neurological Exam: Mental status: alert and oriented to person, place, and time, delayed recall poor, remote memory intact, fund of knowledge intact, attention and concentration intact, speech fluent and not dysarthric, language intact. Montreal Cognitive Assessment  11/05/2014  Visuospatial/ Executive (0/5) 4  Naming (0/3) 3  Attention: Read list of digits (0/2) 2  Attention: Read list of letters (0/1) 1    Attention: Serial 7 subtraction starting at 100 (0/3) 3  Language: Repeat phrase (0/2) 1  Language : Fluency (0/1) 1  Abstraction (0/2) 2  Delayed Recall (0/5) 0  Orientation (0/6) 6  Total 23  Adjusted Score (based on education) 23   Cranial nerves: CN I: not tested CN II: pupils equal, round and reactive to light, visual fields intact, fundi unremarkable, without vessel changes, exudates, hemorrhages or papilledema. CN III, IV, VI:  full range of motion, no nystagmus, no ptosis CN V: facial sensation intact CN VII: upper and lower face symmetric CN VIII: hearing intact CN IX, X: gag intact, uvula midline CN XI: sternocleidomastoid and trapezius muscles intact CN XII: tongue midline Bulk & Tone: normal, no fasciculations. Motor:  5/5 throughout Sensation: temperature and vibration sensation intact. Deep Tendon Reflexes:  2+ throughout, toes downgoing.  Finger to nose testing:  Without dysmetria.  Heel to shin:  Without dysmetria.  Gait:  Normal station and stride.  Able to turn and tandem walk. Romberg negative.  IMPRESSION: Amnestic mild cognitive impairment  PLAN: 1.  Will check MRI of the brain 2.  Continue Aricept 3.  Follow up in 9 months.  Thank you for allowing me to take part in the care of this patient.  Metta Clines, DO  CC:  Arnette Norris, MD

## 2014-11-07 ENCOUNTER — Ambulatory Visit
Admission: RE | Admit: 2014-11-07 | Discharge: 2014-11-07 | Disposition: A | Discharge status: Home / Self Care | Payer: Medicare Other | Source: Ambulatory Visit | Attending: Neurology | Admitting: Neurology

## 2014-11-07 ENCOUNTER — Telehealth: Payer: Self-pay

## 2014-11-07 NOTE — Telephone Encounter (Signed)
-----   Message from Pieter Partridge, DO sent at 11/07/2014  3:29 PM EDT ----- MRI of brain looks okay

## 2014-11-07 NOTE — Telephone Encounter (Signed)
Spoke with pt. Pt. Advised of all results. [facility]

## 2015-02-12 ENCOUNTER — Ambulatory Visit (INDEPENDENT_AMBULATORY_CARE_PROVIDER_SITE_OTHER): Payer: Medicare Other | Admitting: Family Medicine

## 2015-02-12 ENCOUNTER — Encounter: Payer: Self-pay | Admitting: Family Medicine

## 2015-02-12 VITALS — BP 110/72 | HR 77 | Temp 97.6°F | Ht 69.29 in | Wt 196.5 lb

## 2015-02-12 DIAGNOSIS — L291 Pruritus scroti: Secondary | ICD-10-CM | POA: Diagnosis not present

## 2015-02-12 MED ORDER — TRIAMCINOLONE ACETONIDE 0.1 % EX OINT: 1.0000 "application " | TOPICAL_OINTMENT | Freq: Two times a day (BID) | CUTANEOUS | Status: DC | PRN

## 2015-02-12 NOTE — Progress Notes (Signed)
Subjective:  Patient ID: Ricky Boyd, male    DOB: 24-Nov-1940  Age: 74 y.o. MRN: [id number]  CC: Scrotal itching  HPI:  74 year old male resents to clinic today for an acute visit with complaints of scrotal itching.  Scrotal itching  Patient reports that he's had scrotal itching bilaterally, right greater than left for the past 1.5 years.  No known inciting factor. He does state that this started after he began having intercourse with his partner.  He states that he is not cyanotic medical attention because he had some improvement with an ointment at home. However, he states that it has not resolved and this prompted him to come in for evaluation.  He is no longer having intercourse with his partner as they have broken up. This has not altered the course of his itching. He continues to have severe itching of the scrotum.  No reported rash.  No known exacerbating factors.  No other sensory symptoms.  Social Hx   Social History   Social History  . Marital Status: Single    Spouse Name: N/A  . Number of Children: 3  . Years of Education: Master's   Social History Main Topics  . Smoking status: Former Research scientist (life sciences)  . Smokeless tobacco: Former Systems developer  . Alcohol Use: 3.6 oz/week    6 Cans of beer per week     Comment: occasionally  . Drug Use: No  . Sexual Activity: Not Asked   Other Topics Concern  . None   Social History Narrative   Desires CPR.      Would desire life support and and possibly feeding tube.      Patient currently lives alone- 2nd wife killed herself. Lives in one story house.    Patient is left handed.   Patient has a Scientist, water quality.    Review of Systems  Constitutional: Negative.   Skin: Negative for rash.   Objective:  BP 110/72 mmHg  Pulse 77  Temp(Src) 97.6 F (36.4 C) (Oral)  Ht 5' 9.29" (1.76 m)  Wt 196 lb 8 oz (89.132 kg)  BMI 28.77 kg/m2  SpO2 95%  BP/Weight 02/12/2015 11/05/2014 [id number]  Systolic BP [id number]  Diastolic BP 72  68 70  Wt. (Lbs) 196.5 201.3 196.75  BMI 28.77 29.48 29.92   Physical Exam  Constitutional: He appears well-developed. No distress.  Genitourinary:  Penis normal. No lesions. Scrotum - Angiokeratomas of [place] noted. No rash. No other findings noted.   Neurological: He is alert.  Psychiatric: He has a normal mood and affect.  Vitals reviewed.  Lab Results  Component Value Date   WBC 6.8 07/21/2013   HGB 15.2 07/21/2013   HCT 43.4 07/21/2013   PLT 121* 07/21/2013   GLUCOSE 83 04/02/2014   CHOL 179 06/22/2012   TRIG 76.0 06/22/2012   HDL 44.40 06/22/2012   LDLCALC 119* 06/22/2012   ALT 18 04/02/2014   AST 25 04/02/2014   NA 139 04/02/2014   K 4.0 04/02/2014   CL 104 04/02/2014   CREATININE 1.17 04/02/2014   BUN 19 04/02/2014   CO2 28 04/02/2014   TSH 3.96 04/02/2014   PSA 6.12* 08/30/2012    Assessment & Plan:   Problem List Items Addressed This Visit    Scrotal itching - Primary    New problem, unclear etiology. No rash. 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 Treating with empiric Triamcinolone ointment. Follow up with PCP.         Meds ordered  this encounter  Medications  . triamcinolone ointment (KENALOG) 0.1 %    Sig: Apply 1 application topically 2 (two) times daily as needed.    Dispense:  30 g    Refill:  0    Follow-up: PRN  [place]

## 2015-02-12 NOTE — Patient Instructions (Signed)
Your exam was unremarkable.  Use the topical agent as needed.  Follow up closely with your PCP.  Take care  Dr. Lacinda Axon

## 2015-02-12 NOTE — Assessment & Plan Note (Signed)
New problem, unclear etiology. No rash. 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 Treating with empiric Triamcinolone ointment. Follow up with PCP.

## 2015-02-12 NOTE — Progress Notes (Signed)
Pre visit review using our clinic review tool, if applicable. No additional management support is needed unless otherwise documented below in the visit note. 

## 2015-02-26 ENCOUNTER — Other Ambulatory Visit (INDEPENDENT_AMBULATORY_CARE_PROVIDER_SITE_OTHER): Payer: Medicare Other

## 2015-02-26 ENCOUNTER — Encounter: Payer: Self-pay | Admitting: Family Medicine

## 2015-02-26 ENCOUNTER — Ambulatory Visit (INDEPENDENT_AMBULATORY_CARE_PROVIDER_SITE_OTHER): Payer: Medicare Other | Admitting: Family Medicine

## 2015-02-26 VITALS — BP 130/84 | HR 77 | Wt 192.0 lb

## 2015-02-26 DIAGNOSIS — J8410 Pulmonary fibrosis, unspecified: Secondary | ICD-10-CM | POA: Diagnosis not present

## 2015-02-26 DIAGNOSIS — M25562 Pain in left knee: Secondary | ICD-10-CM

## 2015-02-26 DIAGNOSIS — M109 Gout, unspecified: Secondary | ICD-10-CM | POA: Diagnosis not present

## 2015-02-26 DIAGNOSIS — M769 Unspecified enthesopathy, lower limb, excluding foot: Secondary | ICD-10-CM | POA: Diagnosis not present

## 2015-02-26 DIAGNOSIS — M76899 Other specified enthesopathies of unspecified lower limb, excluding foot: Secondary | ICD-10-CM

## 2015-02-26 MED ORDER — KETOROLAC TROMETHAMINE 60 MG/2ML IM SOLN
60.0000 mg | Freq: Once | INTRAMUSCULAR | Status: AC
  Administered 2015-02-26: 60 mg via INTRAMUSCULAR

## 2015-02-26 MED ORDER — DOXYCYCLINE HYCLATE 100 MG PO TABS: 100.0000 mg | ORAL_TABLET | Freq: Two times a day (BID) | ORAL | Status: AC

## 2015-02-26 MED ORDER — METHYLPREDNISOLONE ACETATE 80 MG/ML IJ SUSP
80.0000 mg | Freq: Once | INTRAMUSCULAR | Status: AC
  Administered 2015-02-26: 80 mg via INTRAMUSCULAR

## 2015-02-26 NOTE — Patient Instructions (Addendum)
Good to meet you For the knee seems to be more gout.  Ice 20 minutes 2 times daily. Usually after activity and before bed. Take the colchicine 2 times daily for next 5 days Can try pennsaid pinkie amount topically 2 times daily as needed.  I think you did injure you quad tendon as well.  Try exercises 3 times a week 2 injections to help with the pain For the cold if not better in 2 days take the antibiotic.  See me again in 2 weeks if not better.

## 2015-02-26 NOTE — Assessment & Plan Note (Signed)
History of interstitial lung disease and patient does have expiratory wheezing throughout. I believe the patient does have a viral infection but secondary to his comorbidities patient given antibiotic to take in the next 48 hours if no improvement.

## 2015-02-26 NOTE — Progress Notes (Signed)
[facility] [place] Ricky Boyd, [place] [address] Phone: [phone] Subjective:      CC: Left knee pain  [id number] Ricky Boyd is a 75 y.o. male coming in with complaint of left knee pain. States that he was dancing approximately 10 days ago and started having increasing pain. Patient states this seems to be on the anterior aspect of the knee and does have some inflammation. Patient states that it does seem to be reddened. No sign of infection though. Past medical history significant for quadriceps tendinitis which patient was sent for an MRI. Patient states that this is a different pain than previously.     Past Medical History  Diagnosis Date  . Hearing aid worn   . Memory deficit   . Arthritis   . Cancer [facility])     Prostate   Past Surgical History  Procedure Laterality Date  . Hemorroidectomy    . Tonsillectomy and adenoidectomy    . Colonoscopy     Social History   Social History  . Marital Status: Single    Spouse Name: N/A  . Number of Children: 3  . Years of Education: Master's   Social History Main Topics  . Smoking status: Former Research scientist (life sciences)  . Smokeless tobacco: Former Systems developer  . Alcohol Use: 3.6 oz/week    6 Cans of beer per week     Comment: occasionally  . Drug Use: No  . Sexual Activity: Not on file   Other Topics Concern  . Not on file   Social History Narrative   Desires CPR.      Would desire life support and and possibly feeding tube.      Patient currently lives alone- 2nd wife killed herself. Lives in one story house.    Patient is left handed.   Patient has a Scientist, water quality.    Allergies  Allergen Reactions  . Indocin [Indomethacin] Other (See Comments)    Confusion, short term loss of memory  . Amoxicillin-Pot Clavulanate Other (See Comments)    Bad Dreams  . Erythromycin Ethylsuccinate Other (See Comments)     irritates eyes  . Ibuprofen Other (See Comments)    Confusion on 800mg   Tolerates smaller  doses    Family History  Problem Relation Age of Onset  . Colon cancer Neg Hx   . Diverticulitis Father   . Depression Brother      Past medical history, social, surgical and family history all reviewed in electronic medical record.  No pertanent information unless stated regarding to the chief complaint.   Review of Systems: No headache, visual changes, nausea, vomiting, diarrhea, constipation, dizziness, abdominal pain, skin rash, fevers, chills, night sweats, weight loss, swollen lymph nodes, body aches, joint swelling, muscle aches, chest pain, shortness of breath, mood changes.   Objective Blood pressure 130/84, pulse 77, weight 192 lb (87.091 kg), SpO2 95 %.  General: No apparent distress alert and oriented x3 mood and affect normal, dressed appropriately. Patient is wearing a mask HEENT: Pupils equal, extraocular movements intact doses running with clear fluid Respiratory: Patient's speak in full sentences and does not appear short of breath patient does have diffuse wheezing of the longus. Cardiovascular: No lower extremity edema, non tender, no erythema  Skin: Warm dry intact with no signs of infection or rash on extremities or on axial skeleton.  Abdomen: Soft nontender  Neuro: Cranial nerves II through XII are intact, neurovascularly intact in all extremities with 2+ DTRs and 2+  pulses.  Lymph: No lymphadenopathy of posterior or anterior cervical chain or axillae bilaterally.  Gait normal with good balance and coordination.  MSK:  Non tender with full range of motion and good stability and symmetric strength and tone of shoulders, elbows, wrist, hip, and ankles bilaterally.  Knee: Left Normal to inspection with no erythema or effusion or obvious bony abnormalities. Patient is tender to palpation generalized across the knee especially on the anterior aspect of the patella. Even to light sensation. Does feel warm to touch. Dense her mechanism is intact ROM full in flexion and  extension and lower leg rotation. Ligaments with solid consistent endpoints including ACL, PCL, LCL, MCL. Unable to do McMurray's testing secondary to pain Non painful patellar compression. Patellar glide with mild crepitus. Patellar and quadriceps tendons mildly tender but extensor mechanism is intact. Hamstring and quadriceps strength is normal.  Contralateral knee unremarkable  MSK US performed of left knee This study was ordered, performed, and interpreted by Charlann Boxer D.O.  Knee: All structures visualized. Anteromedial, anterolateral, posteromedial, and posterolateral menisci unremarkable without tearing, fraying, effusion, or displacement. Mild narrowing of the joint space. Patellar Tendon unremarkable on long and transverse views without effusion. Quadriceps tendon does have what appears to be a partial tear with patient having some mild spurring of the superior aspect of the patella. Patient also has what appears to be gouty deposits. No abnormality of prepatellar bursa. LCL and MCL unremarkable on long and transverse views. No abnormality of origin of medial or lateral head of the gastrocnemius.  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

## 2015-02-26 NOTE — Assessment & Plan Note (Signed)
I believe the patient is having some exacerbation of the gout. Patient given 2 injections today to decrease the inflammation. We also have patient take colchicine 2 times daily for the next 5 days to decrease inflammation and see if this will be beneficial. Does appear the patient does have a recurrent hot quadricep tendinitis and quadriceps tendon tear. Extensor mechanism intact. We discussed avoiding any significant range of motion. We discussed bracing which patient declined. Patient and will try the conservative therapy and come back and see me again in 2 weeks for further evaluation and treatment.

## 2015-02-26 NOTE — Assessment & Plan Note (Signed)
Discuss compression, given a very small size trial of anti-inflammatory topically to try. Patient has home exercises done previously for this he states. Patient come back again in 2 weeks to make sure response appropriately.

## 2015-03-12 ENCOUNTER — Other Ambulatory Visit (INDEPENDENT_AMBULATORY_CARE_PROVIDER_SITE_OTHER): Payer: Medicare Other

## 2015-03-12 ENCOUNTER — Encounter: Payer: Self-pay | Admitting: Family Medicine

## 2015-03-12 ENCOUNTER — Ambulatory Visit (INDEPENDENT_AMBULATORY_CARE_PROVIDER_SITE_OTHER): Payer: Medicare Other | Admitting: Family Medicine

## 2015-03-12 VITALS — BP 138/82 | HR 79 | Ht 69.0 in | Wt 192.0 lb

## 2015-03-12 DIAGNOSIS — M769 Unspecified enthesopathy, lower limb, excluding foot: Secondary | ICD-10-CM | POA: Diagnosis not present

## 2015-03-12 DIAGNOSIS — M76899 Other specified enthesopathies of unspecified lower limb, excluding foot: Secondary | ICD-10-CM

## 2015-03-12 NOTE — Assessment & Plan Note (Signed)
Seems near completely healed at this time. No need for any significant changes in treatment. Encourage him to continue to monitor his diet to avoid any significant gout flares. Follow-up as needed.

## 2015-03-12 NOTE — Progress Notes (Signed)
Pre visit review using our clinic review tool, if applicable. No additional management support is needed unless otherwise documented below in the visit note. 

## 2015-03-12 NOTE — Patient Instructions (Signed)
Good to see you  I think we need to check and see if we have control of your gout  Ice can still be helpful

## 2015-03-12 NOTE — Progress Notes (Signed)
[facility] [place], [place] [address] Phone: [phone] Subjective:      CC: Left knee pain follow-up  [id number] Ricky Boyd is a 75 y.o. male coming in with complaint of left knee pain. Patient was found to have a patellar tendinitis as well as somewhat of a quadriceps tendinitis likely secondary to more of a gout deposits. Patient was given colchicine as well as , icing, and some home exercises. Patient was also given an antibiotic secondary to the redness just encase patient was not making any significant improvement over 48 hours. Patient states he is out of percent better at this time. No pain whatsoever. Able to continue to do all daily activities. No pain at night.     Past Medical History  Diagnosis Date  . Hearing aid worn   . Memory deficit   . Arthritis   . Cancer [facility])     Prostate   Past Surgical History  Procedure Laterality Date  . Hemorroidectomy    . Tonsillectomy and adenoidectomy    . Colonoscopy     Social History   Social History  . Marital Status: Single    Spouse Name: N/A  . Number of Children: 3  . Years of Education: Master's   Social History Main Topics  . Smoking status: Former Research scientist (life sciences)  . Smokeless tobacco: Former Systems developer  . Alcohol Use: 3.6 oz/week    6 Cans of beer per week     Comment: occasionally  . Drug Use: No  . Sexual Activity: Not Asked   Other Topics Concern  . None   Social History Narrative   Desires CPR.      Would desire life support and and possibly feeding tube.      Patient currently lives alone- 2nd wife killed herself. Lives in one story house.    Patient is left handed.   Patient has a Scientist, water quality.    Allergies  Allergen Reactions  . Indocin [Indomethacin] Other (See Comments)    Confusion, short term loss of memory  . Amoxicillin-Pot Clavulanate Other (See Comments)    Bad Dreams  . Erythromycin Ethylsuccinate Other (See Comments)     irritates eyes  .  Ibuprofen Other (See Comments)    Confusion on 800mg   Tolerates smaller doses    Family History  Problem Relation Age of Onset  . Colon cancer Neg Hx   . Diverticulitis Father   . Depression Brother      Past medical history, social, surgical and family history all reviewed in electronic medical record.  No pertanent information unless stated regarding to the chief complaint.   Review of Systems: No headache, visual changes, nausea, vomiting, diarrhea, constipation, dizziness, abdominal pain, skin rash, fevers, chills, night sweats, weight loss, swollen lymph nodes, body aches, joint swelling, muscle aches, chest pain, shortness of breath, mood changes.   Objective Blood pressure 138/82, pulse 79, height 5\' 9"  (1.753 m), weight 192 lb (87.091 kg), SpO2 96 %.  General: No apparent distress alert and oriented x3 mood and affect normal, dressed appropriately. Patient is wearing a mask HEENT: Pupils equal, extraocular movements intact doses running with clear fluid Respiratory: Patient's speak in full sentences and does not appear short of breath patient does have diffuse wheezing of the longus. Cardiovascular: No lower extremity edema, non tender, no erythema  Skin: Warm dry intact with no signs of infection or rash on extremities or on axial skeleton.  Abdomen: Soft nontender  Neuro: Cranial nerves II through XII are intact, neurovascularly intact in all extremities with 2+ DTRs and 2+ pulses.  Lymph: No lymphadenopathy of posterior or anterior cervical chain or axillae bilaterally.  Gait normal with good balance and coordination.  MSK:  Non tender with full range of motion and good stability and symmetric strength and tone of shoulders, elbows, wrist, hip, and ankles bilaterally.  Knee: Left Normal to inspection with no erythema or effusion or obvious bony abnormalities. Nontender on exam ROM full in flexion and extension and lower leg rotation. Ligaments with solid consistent endpoints  including ACL, PCL, LCL, MCL. Negative McMurray's Non painful patellar compression. Patellar glide with mild crepitus. Patellar and quadriceps tendons mildly tender but extensor mechanism is intact. Hamstring and quadriceps strength is normal.  Contralateral knee unremarkable  MSK US performed of left knee This study was ordered, performed, and interpreted by Charlann Boxer D.O.  Knee: All structures visualized. Anteromedial, anterolateral, posteromedial, and posterolateral menisci unremarkable without tearing, fraying, effusion, or displacement. Mild narrowing of the joint space. Patellar Tendon unremarkable on long and transverse views without effusion.quadriceps tendon does still have scar tissue formation that appears to be healing. No hypoechoic changes anymore at this time. Large patellar bone spur on the superficial aspect anteriorly still present o abnormality of prepatellar bursa. LCL and MCL unremarkable on long and transverse views. No abnormality of origin of medial or lateral head of the gastrocnemius.  IMPRESSION:  Resolution of gout in the patellar tendon     Impression and Recommendations:     This case required medical decision making of moderate complexity.

## 2015-04-09 ENCOUNTER — Telehealth: Payer: Self-pay | Admitting: Family Medicine

## 2015-04-09 ENCOUNTER — Encounter: Payer: Self-pay | Admitting: Family Medicine

## 2015-04-09 ENCOUNTER — Ambulatory Visit (INDEPENDENT_AMBULATORY_CARE_PROVIDER_SITE_OTHER): Payer: Medicare Other | Admitting: Family Medicine

## 2015-04-09 VITALS — BP 118/70 | HR 90 | Temp 98.5°F | Ht 69.29 in | Wt 192.1 lb

## 2015-04-09 DIAGNOSIS — J988 Other specified respiratory disorders: Secondary | ICD-10-CM

## 2015-04-09 MED ORDER — DOXYCYCLINE HYCLATE 100 MG PO TABS: 100.0000 mg | ORAL_TABLET | Freq: Two times a day (BID) | ORAL | Status: DC

## 2015-04-09 MED ORDER — BENZONATATE 100 MG PO CAPS: 100.0000 mg | ORAL_CAPSULE | Freq: Three times a day (TID) | ORAL | Status: DC | PRN

## 2015-04-09 NOTE — Progress Notes (Signed)
HPI:  Devontre Cribari a 75 year old male with a past medical history of dementia, here for an acute visit for a worsening productive cough. He reports a 10 day history of cough, congestion, chills at times and malaise. He also has had some diarrhea for several days that is now resolving.Denies shortness of breath hematochezia, melena, hemoptysis, vomiting, inability to tolerate oral intake, or change in mental status.Brother reports that he is now staying with them and has eaten well and taken in fluids well today. Brother reports that he has long-standing memory issues, and is seeing his primary doctor tomorrow and is under the care of a neurologist for this.  ROS: See pertinent positives and negatives per HPI.  Past Medical History  Diagnosis Date  . Hearing aid worn   . Memory deficit   . Arthritis   . Cancer [facility])     Prostate    Past Surgical History  Procedure Laterality Date  . Hemorroidectomy    . Tonsillectomy and adenoidectomy    . Colonoscopy      Family History  Problem Relation Age of Onset  . Colon cancer Neg Hx   . Diverticulitis Father   . Depression Brother     Social History   Social History  . Marital Status: Single    Spouse Name: N/A  . Number of Children: 3  . Years of Education: Master's   Social History Main Topics  . Smoking status: Former Research scientist (life sciences)  . Smokeless tobacco: Former Systems developer  . Alcohol Use: 3.6 oz/week    6 Cans of beer per week     Comment: occasionally  . Drug Use: No  . Sexual Activity: Not Asked   Other Topics Concern  . None   Social History Narrative   Desires CPR.      Would desire life support and and possibly feeding tube.      Patient currently lives alone- 2nd wife killed herself. Lives in one story house.    Patient is left handed.   Patient has a Scientist, water quality.      Current outpatient prescriptions:  .  Ascorbic Acid (VITAMIN C) 500 MG CAPS, Take 500 mg by mouth daily. , Disp: , Rfl:  .  colchicine 0.6 MG  tablet, Take 0.6 mg by mouth daily as needed (Gout)., Disp: , Rfl:  .  donepezil (ARICEPT) 10 MG tablet, Take 1 tablet (10 mg total) by mouth daily., Disp: 30 tablet, Rfl: 11 .  fish oil-omega-3 fatty acids 1000 MG capsule, Take 1 g by mouth 2 (two) times daily., Disp: , Rfl:  .  glucosamine-chondroitin 500-400 MG tablet, Take 1 tablet by mouth 2 (two) times daily., Disp: , Rfl:  .  Multiple Vitamins-Minerals (MULTIVITAMIN WITH MINERALS) tablet, Take 1 tablet by mouth daily., Disp: , Rfl:  .  oxybutynin (DITROPAN) 5 MG tablet, Take 5 mg by mouth daily. , Disp: , Rfl:  .  saw palmetto 160 MG capsule, Take 160 mg by mouth 2 (two) times daily., Disp: , Rfl:  .  triamcinolone ointment (KENALOG) 0.1 %, Apply 1 application topically 2 (two) times daily as needed., Disp: 30 g, Rfl: 0 .  benzonatate (TESSALON PERLES) 100 MG capsule, Take 1 capsule (100 mg total) by mouth 3 (three) times daily as needed for cough., Disp: 20 capsule, Rfl: 0 .  doxycycline (VIBRA-TABS) 100 MG tablet, Take 1 tablet (100 mg total) by mouth 2 (two) times daily., Disp: 20 tablet, Rfl: 0  EXAM:  Filed Vitals:  04/09/15 1616  BP: 118/70  Pulse: 90  Temp: 98.5 F (36.9 C)    Body mass index is 28.13 kg/(m^2).  GENERAL: vitals reviewed and listed above, alert, oriented, appears well hydrated and in no acute distress  HEENT: atraumatic, conjunttiva clear, no obvious abnormalities on inspection of external nose and ears, normal appearance of ear canals and TMs, thick nasal congestion, mild post oropharyngeal erythema with PND, no tonsillar edema or exudate, no sinus TTP  NECK: no obvious masses on inspection  LUNGS:  No signs of resp distress, questionable rhonchi R base.  CV: HRRR, no peripheral edema  MS: moves all extremities without noticeable abnormality  PSYCH: pleasant and cooperative, no obvious depression or anxiety  ASSESSMENT AND PLAN:  Discussed the following assessment and plan:  Respiratory  infection - Plan: doxycycline (VIBRA-TABS) 100 MG tablet  - We discussed potential etiologies, with VURI or flu with 2ndary developing bacterial infection most likely. We discussed workup, treatment side effects, likely course, transmission, and signs of developing a serious worsening illness. Patient and his brother decided to start an antibiotic with good upper and lower respiratory coverage and follow-up with his PCP tomorrow. Patient is now staying with his brother who is monitoring his oral intake and agrees to seek medical care if any worsening of symptoms.     Patient Instructions  Please start the antibiotic today.  Follow up with your doctor tomorrow as planned.  Make sure you are drinking plenty of fluids with electrolytes and avoid dairy products for the next 5 days.  Seek care immediately if worsening, struggling to breathe or feeling very sick.       Colin Benton R.

## 2015-04-09 NOTE — Progress Notes (Signed)
Pre visit review using our clinic review tool, if applicable. No additional management support is needed unless otherwise documented below in the visit note. 

## 2015-04-09 NOTE — Telephone Encounter (Signed)
Appointment with Dr. Deborra Medina tomorrow 2/15 at 1215.

## 2015-04-09 NOTE — Patient Instructions (Signed)
Please start the antibiotic today.  Follow up with your doctor tomorrow as planned.  Make sure you are drinking plenty of fluids with electrolytes and avoid dairy products for the next 5 days.  Seek care immediately if worsening, struggling to breathe or feeling very sick.

## 2015-04-09 NOTE — Telephone Encounter (Signed)
Patient Name: Ricky Boyd  DOB: 06/20/1940    Initial Comment Caller states his father has appt tomorrow, but saying he can't recall the previous couple of days, also depression and cough, can't remember when he last ate   Nurse Assessment  Nurse: Mallie Mussel, RN, Alveta Heimlich Date/Time Eilene Ghazi Time): 04/09/2015 1:06:22 PM  Confirm and document reason for call. If symptomatic, describe symptoms. You must click the next button to save text entered. ---Caller states that his father is coughing "ungodly awful." He does not remember if he has eaten in the last 2 days. He is not with his father. He has new depression and he thinks that could be leading to some of his memory loss. He has had a cold that is getting much worse. He wants an appointment for today if possible.  Has the patient traveled out of the country within the last 30 days? ---Not Applicable  Does the patient have any new or worsening symptoms? ---Yes  Will a triage be completed? ---No  Select reason for no triage. ---Other     Guidelines    Guideline Title Affirmed Question Affirmed Notes       Final Disposition User        Comments  Caller states that he was told by the office at [facility] that they were full up today. He wants his Dad to be seen today. I checked [facility] and they were full up for today. I checked [facility] and was able to schedule an appointment with Dr. Colin Benton for today at 4:15pm.   Referrals  GO TO FACILITY OTHER - SPECIFY

## 2015-04-10 ENCOUNTER — Ambulatory Visit: Payer: Medicare Other | Admitting: Family Medicine

## 2015-04-18 ENCOUNTER — Ambulatory Visit (INDEPENDENT_AMBULATORY_CARE_PROVIDER_SITE_OTHER): Payer: Medicare Other | Admitting: Family Medicine

## 2015-04-18 ENCOUNTER — Encounter: Payer: Self-pay | Admitting: Family Medicine

## 2015-04-18 VITALS — BP 114/66 | HR 67 | Temp 97.8°F | Wt 191.5 lb

## 2015-04-18 DIAGNOSIS — G3184 Mild cognitive impairment, so stated: Secondary | ICD-10-CM

## 2015-04-18 DIAGNOSIS — J069 Acute upper respiratory infection, unspecified: Secondary | ICD-10-CM | POA: Diagnosis not present

## 2015-04-18 MED ORDER — DONEPEZIL HCL 10 MG PO TABS: 10.0000 mg | ORAL_TABLET | Freq: Every day | ORAL | Status: DC

## 2015-04-18 MED ORDER — OXYBUTYNIN CHLORIDE 5 MG PO TABS: 5.0000 mg | ORAL_TABLET | Freq: Every day | ORAL | Status: DC

## 2015-04-18 NOTE — Assessment & Plan Note (Signed)
Resolving.  Exam reassuring. Call or return to clinic prn if these symptoms worsen or fail to improve as anticipated. The patient indicates understanding of these issues and agrees with the plan.

## 2015-04-18 NOTE — Progress Notes (Signed)
Pre visit review using our clinic review tool, if applicable. No additional management support is needed unless otherwise documented below in the visit note. 

## 2015-04-18 NOTE — Assessment & Plan Note (Signed)
>  25 minutes spent in face to face time with patient, >50% spent in counselling or coordination of care Continue current rx. Recent episode likely due to acute illness. See below. Call or return to clinic prn if these symptoms worsen or fail to improve as anticipated.

## 2015-04-18 NOTE — Progress Notes (Signed)
Subjective:   Patient ID: Ricky Boyd, male    DOB: May 08, 1940, 75 y.o.   MRN: [id number]  Ricky Boyd is a pleasant 75 y.o. year old male who presents to clinic today with his son and brother for  Follow-up  on 04/18/2015  HPI:  Has been on aricept for many years- initially started by [employer].  Mom had Alzheimers but did not become severe until she was in her 38s.   Lives alone.  Has no problems completely all of his ADLs and AIDLs.  Cooks for himself, balances his own checkbook.  Drives and never forgets where he lives or names of people he sees often.  I saw him for this complaint again in 08/2014 and at that time refererd him to neurology.  He saw Dr. Tomi Likens on 11/05/14.  Note reviewed.  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  MRI of brain from 11/07/14 reviewed- no acute findings.  Saw Dr. Maudie Mercury on 04/09/15 for URI- note reviewed.  Given tessalon and 10 day course of doxycyline.  Symptoms are improving.  Still has a little bit of a cough.  During that acute illness, memory was much worse.  He even forgot the name of his step daughter.  He is now back to baseline according to his family.   Current Outpatient Prescriptions on File Prior to Visit  Medication Sig Dispense Refill  . Ascorbic Acid (VITAMIN C) 500 MG CAPS Take 500 mg by mouth daily.     . colchicine 0.6 MG tablet Take 0.6 mg by mouth daily as needed (Gout).    . fish oil-omega-3 fatty acids 1000 MG capsule Take 1 g by mouth 2 (two) times daily.    Marland Kitchen glucosamine-chondroitin 500-400 MG tablet Take 1 tablet by mouth 2 (two) times daily.    . Multiple Vitamins-Minerals (MULTIVITAMIN WITH MINERALS) tablet Take 1 tablet by mouth daily.    . saw palmetto 160 MG capsule Take 160 mg by mouth 2 (two) times daily.    Marland Kitchen triamcinolone ointment (KENALOG) 0.1 % Apply 1 application topically 2 (two) times daily as needed. 30 g 0   No current facility-administered  medications on file prior to visit.    Allergies  Allergen Reactions  . Indocin [Indomethacin] Other (See Comments)    Confusion, short term loss of memory  . Amoxicillin-Pot Clavulanate Other (See Comments)    Bad Dreams  . Erythromycin Ethylsuccinate Other (See Comments)     irritates eyes  . Ibuprofen Other (See Comments)    Confusion on 800mg   Tolerates smaller doses    Past Medical History  Diagnosis Date  . Hearing aid worn   . Memory deficit   . Arthritis   . Cancer [facility])     Prostate    Past Surgical History  Procedure Laterality Date  . Hemorroidectomy    . Tonsillectomy and adenoidectomy    . Colonoscopy      Family History  Problem Relation Age of Onset  . Colon cancer Neg Hx   . Diverticulitis Father   . Depression Brother     Social History   Social History  . Marital Status: Single    Spouse Name: N/A  . Number of Children: 3  . Years of Education: Master's   Occupational History  . Not on file.   Social History Main Topics  . Smoking status: Former Research scientist (life sciences)  . Smokeless tobacco: Former Systems developer  . Alcohol Use:  3.6 oz/week    6 Cans of beer per week     Comment: occasionally  . Drug Use: No  . Sexual Activity: Not on file   Other Topics Concern  . Not on file   Social History Narrative   Desires CPR.      Would desire life support and and possibly feeding tube.      Patient currently lives alone- 2nd wife killed herself. Lives in one story house.    Patient is left handed.   Patient has a Scientist, water quality.    The PMH, PSH, Social History, Family History, Medications, and allergies have been reviewed in [facility], and have been updated if relevant.  Review of Systems  Constitutional: Negative.   HENT: Negative.   Respiratory: Positive for cough. Negative for shortness of breath, wheezing and stridor.   Cardiovascular: Negative.   Neurological: Negative.   Psychiatric/Behavioral: Negative.  Negative for suicidal ideas, hallucinations,  behavioral problems, confusion, sleep disturbance, dysphoric mood and decreased concentration. The patient is not nervous/anxious.   All other systems reviewed and are negative.      Objective:    BP 114/66 mmHg  Pulse 67  Temp(Src) 97.8 F (36.6 C) (Oral)  Wt 191 lb 8 oz (86.864 kg)  SpO2 97%   Physical Exam  Constitutional: He is oriented to person, place, and time. He appears well-developed and well-nourished. No distress.  HENT:  Head: Normocephalic and atraumatic.  Eyes: Conjunctivae are normal.  Neck: Normal range of motion.  Cardiovascular: Normal rate.   Pulmonary/Chest: Effort normal.  Musculoskeletal: He exhibits no edema.  Neurological: He is alert and oriented to person, place, and time. No cranial nerve deficit.  Skin: Skin is warm and dry.  Psychiatric: He has a normal mood and affect. His behavior is normal. Judgment and thought content normal.             Assessment & Plan:   Amnestic MCI (mild cognitive impairment with memory loss)  Acute upper respiratory infection No Follow-up on file.

## 2015-07-18 ENCOUNTER — Telehealth: Payer: Self-pay

## 2015-07-18 NOTE — Telephone Encounter (Signed)
Pt left note requesting med to increase sex drive. Advised pt he would need to schedule appt. Melissa scheduled appt with Dr Deborra Medina on 07/23/15.

## 2015-07-23 ENCOUNTER — Encounter: Payer: Self-pay | Admitting: Family Medicine

## 2015-07-23 ENCOUNTER — Telehealth: Payer: Self-pay

## 2015-07-23 ENCOUNTER — Ambulatory Visit (INDEPENDENT_AMBULATORY_CARE_PROVIDER_SITE_OTHER): Payer: Medicare Other | Admitting: Family Medicine

## 2015-07-23 VITALS — BP 136/70 | HR 80 | Temp 97.6°F | Wt 191.0 lb

## 2015-07-23 DIAGNOSIS — G3184 Mild cognitive impairment, so stated: Secondary | ICD-10-CM

## 2015-07-23 DIAGNOSIS — N529 Male erectile dysfunction, unspecified: Secondary | ICD-10-CM | POA: Diagnosis not present

## 2015-07-23 DIAGNOSIS — N528 Other male erectile dysfunction: Secondary | ICD-10-CM | POA: Diagnosis not present

## 2015-07-23 MED ORDER — SILDENAFIL CITRATE 25 MG PO TABS: 25.0000 mg | ORAL_TABLET | Freq: Every day | ORAL | Status: DC | PRN

## 2015-07-23 MED ORDER — SILDENAFIL CITRATE 20 MG PO TABS: 20.0000 mg | ORAL_TABLET | ORAL | Status: DC | PRN

## 2015-07-23 NOTE — Telephone Encounter (Signed)
eRx sent

## 2015-07-23 NOTE — Assessment & Plan Note (Signed)
Deteriorated. Discussed side effects and adverse reactions associated with viagra. eRx sent for viagra 25 mg prn. He will keep me updated with his symptoms.

## 2015-07-23 NOTE — Progress Notes (Signed)
Pre visit review using our clinic review tool, if applicable. No additional management support is needed unless otherwise documented below in the visit note. 

## 2015-07-23 NOTE — Patient Instructions (Signed)
Great to see you. We are starting Viagra as needed. Please keep med updated.

## 2015-07-23 NOTE — Telephone Encounter (Signed)
Pt was seen earlier today and pt request generic viagra; sildenafil to [facility]. Spoke with Jinny Blossom at [facility] and sildenafil or viagra 25 mg does not come in generic; the only generic form of viagra is sildenafil 20 mg. Pt request sildenafil 20 mg to [facility] pharmacy. Pt request cb when sent to [facility].

## 2015-07-23 NOTE — Progress Notes (Signed)
Subjective:   Patient ID: Ricky Boyd, male    DOB: 1940/03/20, 75 y.o.   MRN: [id number]  Ricky Boyd is a pleasant 75 y.o. year old male who presents to clinic today with his son and brother for  Follow-up  on 07/23/2015  HPI:  Erectile dysfunction- Starting to really bother him.  He is dating someone now and has been unable to get an erection for months.  In past, when this happened, he did try viagra without any side effects.  He is asking for another prescription for viagra today.  Current Outpatient Prescriptions on File Prior to Visit  Medication Sig Dispense Refill  . Ascorbic Acid (VITAMIN C) 500 MG CAPS Take 500 mg by mouth daily.     Marland Kitchen donepezil (ARICEPT) 10 MG tablet Take 1 tablet (10 mg total) by mouth daily. 30 tablet 11  . fish oil-omega-3 fatty acids 1000 MG capsule Take 1 g by mouth 2 (two) times daily.    Marland Kitchen glucosamine-chondroitin 500-400 MG tablet Take 1 tablet by mouth 2 (two) times daily.    . Multiple Vitamins-Minerals (MULTIVITAMIN WITH MINERALS) tablet Take 1 tablet by mouth daily.    Marland Kitchen oxybutynin (DITROPAN) 5 MG tablet Take 1 tablet (5 mg total) by mouth daily. 30 tablet 3  . saw palmetto 160 MG capsule Take 160 mg by mouth 2 (two) times daily.    Marland Kitchen triamcinolone ointment (KENALOG) 0.1 % Apply 1 application topically 2 (two) times daily as needed. 30 g 0   No current facility-administered medications on file prior to visit.    Allergies  Allergen Reactions  . Indocin [Indomethacin] Other (See Comments)    Confusion, short term loss of memory  . Amoxicillin-Pot Clavulanate Other (See Comments)    Bad Dreams  . Erythromycin Ethylsuccinate Other (See Comments)     irritates eyes  . Ibuprofen Other (See Comments)    Confusion on 800mg   Tolerates smaller doses    Past Medical History  Diagnosis Date  . Hearing aid worn   . Memory deficit   . Arthritis   . Cancer [facility])     Prostate    Past Surgical History  Procedure Laterality Date  .  Hemorroidectomy    . Tonsillectomy and adenoidectomy    . Colonoscopy      Family History  Problem Relation Age of Onset  . Colon cancer Neg Hx   . Diverticulitis Father   . Depression Brother     Social History   Social History  . Marital Status: Single    Spouse Name: N/A  . Number of Children: 3  . Years of Education: Master's   Occupational History  . Not on file.   Social History Main Topics  . Smoking status: Former Research scientist (life sciences)  . Smokeless tobacco: Former Systems developer  . Alcohol Use: 3.6 oz/week    6 Cans of beer per week     Comment: occasionally  . Drug Use: No  . Sexual Activity: Not on file   Other Topics Concern  . Not on file   Social History Narrative   Desires CPR.      Would desire life support and and possibly feeding tube.      Patient currently lives alone- 2nd wife killed herself. Lives in one story house.    Patient is left handed.   Patient has a Scientist, water quality.    The PMH, PSH, Social History, Family History, Medications, and allergies have been reviewed in [facility], and have been  updated if relevant.  Review of Systems  Constitutional: Negative.   HENT: Negative.   Respiratory: Negative for cough, shortness of breath, wheezing and stridor.   Cardiovascular: Negative.   Neurological: Negative.   Psychiatric/Behavioral: Negative.  Negative for suicidal ideas, hallucinations, behavioral problems, confusion, sleep disturbance, dysphoric mood and decreased concentration. The patient is not nervous/anxious.   All other systems reviewed and are negative.      Objective:    BP 136/70 mmHg  Pulse 80  Temp(Src) 97.6 F (36.4 C) (Oral)  Wt 191 lb (86.637 kg)  SpO2 93%   Physical Exam  Constitutional: He is oriented to person, place, and time. He appears well-developed and well-nourished. No distress.  HENT:  Head: Normocephalic and atraumatic.  Eyes: Conjunctivae are normal.  Neck: Normal range of motion.  Cardiovascular: Normal rate.     Pulmonary/Chest: Effort normal.  Musculoskeletal: He exhibits no edema.  Neurological: He is alert and oriented to person, place, and time. No cranial nerve deficit.  Skin: Skin is warm and dry.  Psychiatric: He has a normal mood and affect. His behavior is normal. Judgment and thought content normal.             Assessment & Plan:   Other male erectile dysfunction No Follow-up on file.

## 2015-08-05 ENCOUNTER — Telehealth: Payer: Self-pay | Admitting: Family Medicine

## 2015-08-05 NOTE — Telephone Encounter (Signed)
LM for pt to sch AWV, mn ° °

## 2015-08-06 ENCOUNTER — Ambulatory Visit (INDEPENDENT_AMBULATORY_CARE_PROVIDER_SITE_OTHER): Payer: Medicare Other

## 2015-08-06 ENCOUNTER — Ambulatory Visit (INDEPENDENT_AMBULATORY_CARE_PROVIDER_SITE_OTHER): Payer: Medicare Other | Admitting: Family Medicine

## 2015-08-06 ENCOUNTER — Encounter: Payer: Self-pay | Admitting: Family Medicine

## 2015-08-06 VITALS — BP 108/64 | HR 74 | Temp 97.7°F | Ht 69.29 in | Wt 194.5 lb

## 2015-08-06 VITALS — BP 108/64 | HR 74 | Temp 97.7°F | Ht 69.3 in | Wt 194.5 lb

## 2015-08-06 DIAGNOSIS — M5431 Sciatica, right side: Secondary | ICD-10-CM | POA: Diagnosis not present

## 2015-08-06 DIAGNOSIS — Z0000 Encounter for general adult medical examination without abnormal findings: Secondary | ICD-10-CM

## 2015-08-06 MED ORDER — DICLOFENAC SODIUM 75 MG PO TBEC: 75.0000 mg | DELAYED_RELEASE_TABLET | Freq: Two times a day (BID) | ORAL | Status: DC

## 2015-08-06 NOTE — Progress Notes (Signed)
I reviewed health advisor's note, was available for consultation, and agree with documentation and plan.  Magan Winnett, MD [facility] [place]  

## 2015-08-06 NOTE — Progress Notes (Signed)
Pre visit review using our clinic review tool, if applicable. No additional management support is needed unless otherwise documented below in the visit note. 

## 2015-08-06 NOTE — Patient Instructions (Signed)
[name] , Thank you for taking time to come for your Medicare Wellness Visit. I appreciate your ongoing commitment to your health goals. Please review the following plan we discussed and let me know if I can assist you in the future.   These are the goals we discussed: Goals    . Increase physical activity     Starting 08/06/2015, I will continue to walk or cut wood for 30 min 3 days per week.        This is a list of the screening recommended for you and due dates:  Health Maintenance  Topic Date Due  . Flu Shot  09/24/2015  . Colon Cancer Screening  04/16/2016  . Tetanus Vaccine  12/27/2018  . Shingles Vaccine  Completed  . Pneumonia vaccines  Completed    Preventive Care for Adults  A healthy lifestyle and preventive care can promote health and wellness. Preventive health guidelines for adults include the following key practices.  . A routine yearly physical is a good way to check with your health care provider about your health and preventive screening. It is a chance to share any concerns and updates on your health and to receive a thorough exam.  . Visit your dentist for a routine exam and preventive care every 6 months. Brush your teeth twice a day and floss once a day. Good oral hygiene prevents tooth decay and gum disease.  . The frequency of eye exams is based on your age, health, family medical history, use  of contact lenses, and other factors. Follow your health care provider's ecommendations for frequency of eye exams.  . Eat a healthy diet. Foods like vegetables, fruits, whole grains, low-fat dairy products, and lean protein foods contain the nutrients you need without too many calories. Decrease your intake of foods high in solid fats, added sugars, and salt. Eat the right amount of calories for you. Get information about a proper diet from your health care provider, if necessary.  . Regular physical exercise is one of the most important things you can do for your  health. Most adults should get at least 150 minutes of moderate-intensity exercise (any activity that increases your heart rate and causes you to sweat) each week. In addition, most adults need muscle-strengthening exercises on 2 or more days a week.  Silver Sneakers may be a benefit available to you. To determine eligibility, you may visit the website: www.silversneakers.com or contact program at 219-259-0841 Mon-Fri between 8AM-8PM.   . Maintain a healthy weight. The body mass index (BMI) is a screening tool to identify possible weight problems. It provides an estimate of body fat based on height and weight. Your health care provider can find your BMI and can help you achieve or maintain a healthy weight.   For adults 20 years and older: ? A BMI below 18.5 is considered underweight. ? A BMI of 18.5 to 24.9 is normal. ? A BMI of 25 to 29.9 is considered overweight. ? A BMI of 30 and above is considered obese.   . Maintain normal blood lipids and cholesterol levels by exercising and minimizing your intake of saturated fat. Eat a balanced diet with plenty of fruit and vegetables. Blood tests for lipids and cholesterol should begin at age 19 and be repeated every 5 years. If your lipid or cholesterol levels are high, you are over 50, or you are at high risk for heart disease, you may need your cholesterol levels checked more frequently. Ongoing high  lipid and cholesterol levels should be treated with medicines if diet and exercise are not working.  . If you smoke, find out from your health care provider how to quit. If you do not use tobacco, please do not start.  . If you choose to drink alcohol, please do not consume more than 2 drinks per day. One drink is considered to be 12 ounces (355 mL) of beer, 5 ounces (148 mL) of wine, or 1.5 ounces (44 mL) of liquor.  . If you are 66-55 years old, ask your health care provider if you should take aspirin to prevent strokes.  . Use sunscreen. Apply  sunscreen liberally and repeatedly throughout the day. You should seek shade when your shadow is shorter than you. Protect yourself by wearing long sleeves, pants, a wide-brimmed hat, and sunglasses year round, whenever you are outdoors.  . Once a month, do a whole body skin exam, using a mirror to look at the skin on your back. Tell your health care provider of new moles, moles that have irregular borders, moles that are larger than a pencil eraser, or moles that have changed in shape or color.

## 2015-08-06 NOTE — Assessment & Plan Note (Signed)
No recent injury.. No indication for X-ray, no vertenral ttp. Most likely sciatic a pain but pt with  ttp over trocahanteric bursa as well so there may be some element of bursitis as well.  Treat with NSAIDs.. Diclofenac, home PT and heat.  If not improving in 2 weeks, follow up with PCP.

## 2015-08-06 NOTE — Progress Notes (Signed)
   Subjective:    Patient ID: Ricky Boyd, male    DOB: 20-Jan-1941, 75 y.o.   MRN: [id number]  HPI  75 year old male  With history of MCIpresents for new onset right lateral hip pain radiating to right leg to calf.  Ongoing off and on in last year, worse in last few months? Pt not sure.  Pain 3-4 on pain scale.. Worse with bending.  Has tried ibuprofen 600 mg as needed.Marland Kitchen Helps some.  He does report a short fall from 3 steps from deer stand, landed on right side 6-7 years ago?  No numbness, no weakness, no fever, no incontinence.    Social History /Family History/Past Medical History reviewed and updated if needed. No past back issues or back surgeries.  Review of Systems  Constitutional: Negative for diaphoresis and fatigue.  HENT: Negative for ear pain.   Eyes: Negative for pain.  Respiratory: Negative for cough and shortness of breath.   Cardiovascular: Negative for chest pain.       Objective:   Physical Exam  Constitutional: Vital signs are normal. He appears well-developed and well-nourished.  HENT:  Head: Normocephalic.  Right Ear: Hearing normal.  Left Ear: Hearing normal.  Nose: Nose normal.  Mouth/Throat: Oropharynx is clear and moist and mucous membranes are normal.  Neck: Trachea normal. Carotid bruit is not present. No thyroid mass and no thyromegaly present.  Cardiovascular: Normal rate, regular rhythm and normal pulses.  Exam reveals no gallop, no distant heart sounds and no friction rub.   No murmur heard. No peripheral edema  Pulmonary/Chest: Effort normal and breath sounds normal. No respiratory distress.  Musculoskeletal:       Right hip: He exhibits normal range of motion, normal strength and no tenderness.       Lumbar back: He exhibits normal range of motion, no tenderness and no bony tenderness.  Points to ttp in right lateral hip as well as in sciatic notch.. No current ttp.  Neg Faber's, neg SLR  Neurological: He has normal strength. No cranial  nerve deficit or sensory deficit. He exhibits normal muscle tone. Coordination and gait normal.  Skin: Skin is warm, dry and intact. No rash noted.  Psychiatric: He has a normal mood and affect. His speech is normal and behavior is normal. Thought content normal.          Assessment & Plan:

## 2015-08-06 NOTE — Progress Notes (Signed)
PCP notes:  Health maintenance: No gaps identified or addressed.  Abnormal screenings:   Mini-Cog score: 18/20; pt has dx of memory loss  Patient concerns: None  Nurse concerns: None  Next PCP appt: None; Pt declined stating he did not need a CPE at this time.

## 2015-08-06 NOTE — Progress Notes (Signed)
Subjective:   Ricky Boyd is a 75 y.o. male who presents for Medicare Annual/Subsequent preventive examination.  Review of Systems:  N/A Cardiac Risk Factors include: advanced age (>43men, >84 women);male gender     Objective:    Vitals: BP 108/64 mmHg  Pulse 74  Temp(Src) 97.7 F (36.5 C) (Oral)  Ht 5' 9.3" (1.76 m)  Wt 194 lb 8 oz (88.225 kg)  BMI 28.48 kg/m2  SpO2 97%  Body mass index is 28.48 kg/(m^2).  Tobacco History  Smoking status  . Former Smoker  Smokeless tobacco  . Former Systems developer    Comment: quit 1963     Counseling given: No   Past Medical History  Diagnosis Date  . Hearing aid worn   . Memory deficit   . Arthritis   . Cancer [facility])     Prostate   Past Surgical History  Procedure Laterality Date  . Hemorroidectomy    . Tonsillectomy and adenoidectomy    . Colonoscopy     Family History  Problem Relation Age of Onset  . Colon cancer Neg Hx   . Diverticulitis Father   . Depression Brother    History  Sexual Activity  . Sexual Activity: Not on file    Outpatient Encounter Prescriptions as of 08/06/2015  Medication Sig  . Ascorbic Acid (VITAMIN C) 500 MG CAPS Take 500 mg by mouth daily.   . diclofenac (VOLTAREN) 75 MG EC tablet Take 1 tablet (75 mg total) by mouth 2 (two) times daily.  Marland Kitchen donepezil (ARICEPT) 10 MG tablet Take 1 tablet (10 mg total) by mouth daily.  . fish oil-omega-3 fatty acids 1000 MG capsule Take 1 g by mouth 2 (two) times daily.  Marland Kitchen glucosamine-chondroitin 500-400 MG tablet Take 1 tablet by mouth 2 (two) times daily.  . Multiple Vitamins-Minerals (MULTIVITAMIN WITH MINERALS) tablet Take 1 tablet by mouth daily.  . saw palmetto 160 MG capsule Take 160 mg by mouth 2 (two) times daily.  . sildenafil (REVATIO) 20 MG tablet Take 1 tablet (20 mg total) by mouth as needed.  Marland Kitchen oxybutynin (DITROPAN) 5 MG tablet Take 1 tablet (5 mg total) by mouth daily. (Patient not taking: Reported on 08/06/2015)  . triamcinolone ointment (KENALOG)  0.1 % Apply 1 application topically 2 (two) times daily as needed. (Patient not taking: Reported on 08/06/2015)   No facility-administered encounter medications on file as of 08/06/2015.    Activities of Daily Living In your present state of health, do you have any difficulty performing the following activities: 08/06/2015  Hearing? Y  Vision? N  Difficulty concentrating or making decisions? Y  Walking or climbing stairs? N  Dressing or bathing? N  Doing errands, shopping? N  Preparing Food and eating ? N  Using the Toilet? N  In the past six months, have you accidently leaked urine? N  Do you have problems with loss of bowel control? N  Managing your Medications? N  Managing your Finances? N  Housekeeping or managing your Housekeeping? N    Patient Care Team: Lucille Passy, MD as PCP - General   Assessment:     Visual Acuity Screening   Right eye Left eye Both eyes  Without correction: 20/25-1 20/25 20/20  With correction:     Hearing Screening Comments: Wears bilateral hearing aids   Exercise Activities and Dietary recommendations Current Exercise Habits: Home exercise routine, Type of exercise: walking;Other - see comments (cutting wood), Time (Minutes): 30, Frequency (Times/Week): 3, Weekly Exercise (Minutes/Week):  90, Intensity: Moderate, Exercise limited by: None identified  Goals    . Increase physical activity     Starting 08/06/2015, I will continue to walk or cut wood for 30 min 3 days per week.       Fall Risk Fall Risk  08/06/2015 06/27/2012 01/28/2012  Falls in the past year? No No No   Depression Screen PHQ 2/9 Scores 08/06/2015 06/27/2012 01/28/2012  PHQ - 2 Score 0 0 0    Cognitive Testing MMSE - Mini Mental State Exam 08/06/2015  Orientation to time 5  Orientation to Place 5  Registration 3  Attention/ Calculation 0  Recall 1  Recall-comments pt was unable to recall 2 of 3 words without cues  Language- name 2 objects 0  Language- repeat 1  Language-  follow 3 step command 3  Language- read & follow direction 0  Write a sentence 0  Copy design 0  Total score 18   PLEASE NOTE: A Mini-Cog screen was completed. Maximum score is 20. A value of 0 denotes this part of Folstein MMSE was not completed or the patient failed this part of the Mini-Cog screening.   Mini-Cog Screening Orientation to Time - Max 5 pts Orientation to Place - Max 5 pts Registration - Max 3 pts Recall - Max 3 pts Language Repeat - Max 1 pts Language Follow 3 Step Command - Max 3 pts  Immunization History  Administered Date(s) Administered  . Influenza Split 01/20/2011  . Influenza Whole 12/24/2006, 12/08/2007, 10/24/2008  . Influenza,inj,Quad PF,36+ Mos 04/02/2014  . Pneumococcal Conjugate-13 04/02/2014  . Pneumococcal Polysaccharide-23 11/25/2004  . Td 03/29/1998, 12/26/2008  . Zoster 08/30/2012   Screening Tests Health Maintenance  Topic Date Due  . INFLUENZA VACCINE  09/24/2015  . COLONOSCOPY  04/16/2016  . TETANUS/TDAP  12/27/2018  . ZOSTAVAX  Completed  . PNA vac Low Risk Adult  Completed      Plan:     I have personally reviewed and addressed the Medicare Annual Wellness questionnaire and have noted the following in the patient's chart:  A. Medical and social history B. Use of alcohol, tobacco or illicit drugs  C. Current medications and supplements D. Functional ability and status E.  Nutritional status F.  Physical activity G. Advance directives H. List of other physicians I.  Hospitalizations, surgeries, and ER visits in previous 12 months J.  [place] to include hearing, vision, cognitive, depression L. Referrals and appointments - none  In addition, I have reviewed and discussed with patient certain preventive protocols, quality metrics, and best practice recommendations. A written personalized care plan for preventive services as well as general preventive health recommendations were provided to patient.  See attached  scanned questionnaire for additional information.   Signed,   Lindell Noe, MHA, BS, LPN Health Advisor

## 2015-08-06 NOTE — Patient Instructions (Signed)
Start home stretching and core strengthening exercise.  Can put heat on tender area.  Start diclofenac in place of ibuprofen twice daily.  If not improving as expected in 2 weeks consider further eval with Dr. Deborra Medina.

## 2015-08-07 ENCOUNTER — Telehealth: Payer: Self-pay

## 2015-08-07 NOTE — Telephone Encounter (Signed)
Pt called and left v/m requesting cb. Left v/m requesting pt to cb.

## 2015-08-07 NOTE — Telephone Encounter (Signed)
Pt request medication for ED; pt does not remember getting sildenafil on 07/23/15. Spoke with Bea at [facility] and pt picked up sildenafil on 07/23/15. Pt will ck med bottles at home and will cb if cannot find the sildenafil bottle.

## 2015-08-07 NOTE — Telephone Encounter (Signed)
Pt left v/m that pt found sildenafil and nothing further needed.

## 2015-08-08 ENCOUNTER — Encounter: Payer: Self-pay | Admitting: Neurology

## 2015-08-08 ENCOUNTER — Ambulatory Visit (INDEPENDENT_AMBULATORY_CARE_PROVIDER_SITE_OTHER): Payer: Medicare Other | Admitting: Neurology

## 2015-08-08 VITALS — BP 117/84 | HR 86 | Ht 70.0 in | Wt 191.0 lb

## 2015-08-08 DIAGNOSIS — G309 Alzheimer's disease, unspecified: Secondary | ICD-10-CM | POA: Diagnosis not present

## 2015-08-08 DIAGNOSIS — F0280 Dementia in other diseases classified elsewhere without behavioral disturbance: Secondary | ICD-10-CM

## 2015-08-08 MED ORDER — MEMANTINE HCL-DONEPEZIL HCL 7 & 14 & 21 &28 -10 MG PO C4PK: 28.0000 mg | EXTENDED_RELEASE_CAPSULE | Freq: Every day | ORAL | Status: DC

## 2015-08-08 MED ORDER — MEMANTINE HCL-DONEPEZIL HCL ER 28-10 MG PO CP24: 28.0000 mg | ORAL_CAPSULE | Freq: Every day | ORAL | Status: DC

## 2015-08-08 NOTE — Progress Notes (Signed)
NEUROLOGY FOLLOW UP OFFICE NOTE  Ricky Boyd [id number]  HISTORY OF PRESENT ILLNESS: Ricky Boyd is a 75 year old left-handed male with elevated PSA who follows up for amnestic [facility].  He is accompanied by his brother who provides some history.  UPDATE: He takes Aricept 10mg  daily.  MRI of brain from 11/07/14 was personally reviewed and revealed only age-related volume loss and minimal small vessel ischemic changes.  Since last visit, his brother has noticed some increased short-term memory problems, particularly forgetting recent activities as soon as the previous day.  He has not had any changes in behavior or personality.  His judgement appears sound.  He is paying his bills appropriately and generally remembers to take his medication.  He reads the newspapers daily.  He keeps active by walking, split wood or tending his garden.  He also goes to choir practice every week.  He socializes often with his son and brother and goes to dances with his girlfriend.  He limits driving locally, such as to his brother's place or grocery stores and no longer drives downtown due to concerns for getting lost.  HISTORY: He used to work as a Pharmacist, hospital for various subjects, including English, Social Studies and Dietitian.  He has a master's degree.    He began having short-term memory problems around 2014.  There has been a little gradual progression.  At first he noticed difficulty remembering names of friends from [place].  He will sometimes not remember the day of the week.  He frequently forgets conversations or recent activities from the previous few days.  He reports that he forgets to take his medication, although he only takes one medication daily.  He doesn't get disoriented when driving, but he will forget for a moment where he wanted to go.  He doesn't repeat questions.  He lives by himself.  He is able to pay the bills and manage his finances.  He is able to perform all activities of daily  living.  He currently is not depressed, but he did feel depressed a couple of months ago when he broke up with his girlfriend.  He does go deer hunting and has firearms in the home.  He is a former smoker.  He has no history of stroke or head injury.  He says there is no family history but notes report that his mother had Alzheimer's dementia.  PAST MEDICAL HISTORY: Past Medical History  Diagnosis Date  . Hearing aid worn   . Memory deficit   . Arthritis   . Cancer [facility])     Prostate    MEDICATIONS: Current Outpatient Prescriptions on File Prior to Visit  Medication Sig Dispense Refill  . Ascorbic Acid (VITAMIN C) 500 MG CAPS Take 500 mg by mouth daily.     . diclofenac (VOLTAREN) 75 MG EC tablet Take 1 tablet (75 mg total) by mouth 2 (two) times daily. 30 tablet 0  . fish oil-omega-3 fatty acids 1000 MG capsule Take 1 g by mouth 2 (two) times daily.    Marland Kitchen glucosamine-chondroitin 500-400 MG tablet Take 1 tablet by mouth 2 (two) times daily.    . Multiple Vitamins-Minerals (MULTIVITAMIN WITH MINERALS) tablet Take 1 tablet by mouth daily.    . saw palmetto 160 MG capsule Take 160 mg by mouth 2 (two) times daily.    . sildenafil (REVATIO) 20 MG tablet Take 1 tablet (20 mg total) by mouth as needed. 10 tablet 0   No current facility-administered medications  on file prior to visit.    ALLERGIES: Allergies  Allergen Reactions  . Indocin [Indomethacin] Other (See Comments)    Confusion, short term loss of memory  . Amoxicillin-Pot Clavulanate Other (See Comments)    Bad Dreams  . Erythromycin Ethylsuccinate Other (See Comments)     irritates eyes  . Ibuprofen Other (See Comments)    Confusion on 800mg   Tolerates smaller doses    FAMILY HISTORY: Family History  Problem Relation Age of Onset  . Colon cancer Neg Hx   . Diverticulitis Father   . Depression Brother     SOCIAL HISTORY: Social History   Social History  . Marital Status: Single    Spouse Name: N/A  . Number  of Children: 3  . Years of Education: Master's   Occupational History  . Not on file.   Social History Main Topics  . Smoking status: Former Research scientist (life sciences)  . Smokeless tobacco: Former Systems developer     Comment: quit 1963  . Alcohol Use: 3.6 oz/week    6 Cans of beer per week     Comment: occasionally  . Drug Use: No  . Sexual Activity: Not on file   Other Topics Concern  . Not on file   Social History Narrative   Desires CPR.      Would desire life support and and possibly feeding tube.      Patient currently lives alone- 2nd wife killed herself. Lives in one story house.    Patient is left handed.   Patient has a Scientist, water quality.     REVIEW OF SYSTEMS: Constitutional: No fevers, chills, or sweats, no generalized fatigue, change in appetite Eyes: No visual changes, double vision, eye pain Ear, nose and throat: No hearing loss, ear pain, nasal congestion, sore throat Cardiovascular: No chest pain, palpitations Respiratory:  No shortness of breath at rest or with exertion, wheezes GastrointestinaI: No nausea, vomiting, diarrhea, abdominal pain, fecal incontinence Genitourinary:  No dysuria, urinary retention or frequency Musculoskeletal:  No neck pain, back pain Integumentary: No rash, pruritus, skin lesions Neurological: as above Psychiatric: No depression, insomnia, anxiety Endocrine: No palpitations, fatigue, diaphoresis, mood swings, change in appetite, change in weight, increased thirst Hematologic/Lymphatic:  No purpura, petechiae. Allergic/Immunologic: no itchy/runny eyes, nasal congestion, recent allergic reactions, rashes  PHYSICAL EXAM: Filed Vitals:   08/08/15 0931  BP: 117/84  Pulse: 86   General: No acute distress.  Patient appears well-groomed.  normal body habitus. Head:  Normocephalic/atraumatic Eyes:  Fundi examined but not visualized Neck: supple, no paraspinal tenderness, full range of motion Heart:  Regular rate and rhythm Lungs:  Clear to auscultation  bilaterally Back: No paraspinal tenderness Neurological Exam: alert and oriented to person, place, and time (except date). Attention span and concentration intact, recent and remote memory intact, fund of knowledge intact.  Speech fluent and not dysarthric, language intact.   Montreal Cognitive Assessment  08/08/2015 11/05/2014  Visuospatial/ Executive (0/5) 2 4  Naming (0/3) 3 3  Attention: Read list of digits (0/2) 2 2  Attention: Read list of letters (0/1) 1 1  Attention: Serial 7 subtraction starting at 100 (0/3) 3 3  Language: Repeat phrase (0/2) 2 1  Language : Fluency (0/1) 1 1  Abstraction (0/2) 1 2  Delayed Recall (0/5) 0 0  Orientation (0/6) 4 6  Total 19 23  Adjusted Score (based on education) 19 23   CN II-XII intact. Bulk and tone normal, muscle strength 5/5 throughout.  Sensation to light touch, temperature  and vibration intact.  Deep tendon reflexes 2+ throughout, toes downgoing.  Finger to nose and heel to shin testing intact.  Gait normal, Romberg negative.  IMPRESSION: Alzheimer's disease.  There has been mild progression.  It appears to be mild, really only affecting memory and not judgment.  Therefore, I don't think there is any issue with keeping firearms, but we will have [place] assess this as well.  His son and brother will keep an eye out for any changes.  PLAN: 1.  Will stop Aricept and start Namzaric 2.  Will get Home Health service to come to the house to assess safety. 3.  Restrict driving locally (such as to brother's house, grocery store or pharmacy) 4.  Encourage continued reading, socialization, physical activity 5.  Provide information on support groups 6.  Follow up in 9 months.  30 minutes spent face to face with patient, over 50% spent discussing diagnosis and counseling.  Metta Clines, DO  CC:  Arnette Norris, MD

## 2015-08-08 NOTE — Patient Instructions (Signed)
1.  I am going to add another memory medication in addition to the Aricept.  The new medication will include the Aricept, so you can stop the Aricept.  The new medication is Namzaric.  Please start the starter pack and we can prescribe you the standing dose when you finish the starter pack. 2.  Continue to limit driving just locally, such as to your brother's, grocery store and pharmacy 3.  We will get a Home Health service to come to your home in order to make recommendations regarding safety. 4.  Follow up in 9 months.  Alzheimer Disease Alzheimer disease is a mental disorder. It causes memory loss and loss of other mental functions, such as learning, thinking, problem solving, communicating, and completing tasks. The mental losses interfere with the ability to perform daily activities at work, at home, or in social situations. Alzheimer disease usually starts in a person's late 61s or early 35s but can start earlier in life (familial form). The mental changes caused by this disease are permanent and worsen over time. As the illness progresses, the ability to do even the simplest things is lost. Survival with Alzheimer disease ranges from several years to as long as 20 years. CAUSES Alzheimer disease is caused by abnormally high levels of a protein (beta-amyloid) in the brain. This protein forms very small deposits within and around the brain's nerve cells. These deposits prevent the nerve cells from working properly. Experts are not certain what causes the beta-amyloid deposits in this disease. RISK FACTORS The following major risk factors have been identified:  Increasing age.  Certain genetic variations, such as Down syndrome (trisomy 21). SYMPTOMS In the early stages of Alzheimer disease, you are still able to perform daily activities but need greater effort, more time, or memory aids. Early symptoms include:  Mild memory loss of recent events, names, or phone numbers.  Loss of  objects.  Minor loss of vocabulary.  Difficulty with complex tasks, such as paying bills or driving in unfamiliar locations. Other mental functions deteriorate as the disease worsens. These changes slowly go from mild to severe. Symptoms at this stage include:  Difficulty remembering. You may not be able to recall personal information such as your address and telephone number. You may become confused about the date, the season of the year, or your location.  Difficulty maintaining attention. You may forget what you wanted to say during conversations and repeat what you have already said.  Difficulty learning new information or tasks. You may not remember what you read or the name of a new friend you met.  Difficulty counting or doing math. You may have difficulty with complex math problems. You may make mistakes in paying bills or managing your checkbook.  Poor reasoning and judgment. You may make poor decisions or not dress right for the weather.  Difficulty communicating. You may have regular difficulty remembering words, naming objects, expressing yourself clearly, or writing sentences that make sense.  Difficulty performing familiar daily activities. You may get lost driving in familiar locations or need help eating, bathing, dressing, grooming, or using the toilet. You may have difficulty maintaining bladder or bowel control.  Difficulty recognizing familiar faces. You may confuse family members or close friends with one another. You may not recognize a close relative or may mistake strangers for family. Alzheimer disease also may cause changes in personality and behavior. These changes include:   Loss of interest or motivation.  Social withdrawal.  Anxiety.  Difficulty sleeping.  Uncharacteristic  anger or combativeness.  A false belief that someone is trying to harm you (paranoia).  Seeing things that are not real (hallucinations).  Agitation. Confusion and disruptive  behavior are often worse at night and may be triggered by changes in the environment or acute medical issues. DIAGNOSIS  Alzheimer disease is diagnosed through an assessment by your health care provider. During this assessment, your health care provider will do the following:  Ask you and your family, friends, or caregivers questions about your symptoms, their frequency, their duration and progression, and the effect they are having on your life.  Ask questions about your personal and family medical history and use of alcohol or drugs, including prescription medicine.  Perform a physical exam and order blood tests and brain imaging exams. Your health care provider may refer you to a specialist for detailed evaluation of your mental functions (neuropsychological testing).  Many different brain disorders, medical conditions, and certain substances can cause symptoms that resemble Alzheimer disease symptoms. These must be ruled out before this disease can be diagnosed. If Alzheimer disease is diagnosed, it will be considered either "possible" or "probable" Alzheimer disease. "Possible" Alzheimer disease means that your symptoms are typical of the disease and no other disorder is causing them. "Probable" Alzheimer disease means that you also have a family history of the disease or genetic test results that support the diagnosis. Certain tests, mostly used in research studies, are highly specific for Alzheimer disease.  TREATMENT  There is currently no cure for this disease. The goals of treatment are to:  Slow down the progression of the disease.  Preserve mental function as long as possible.  Manage behavioral symptoms.  Make life easier for the person with Alzheimer disease and his or her caregivers. The following treatment options are available:  Medicine. Certain medicines may help slow memory loss by changing the level of certain chemicals in the brain. Medicine may also help with behavioral  symptoms.  Talk therapy. Talk therapy provides education, support, and memory aids for people with this disease. It is most effective in the early stages of the illness.  Caregiving. Caregivers may be family members, friends, or trained medical professionals. They help the person with Alzheimer disease with daily life activities. Caregiving may take place at home or at a nursing facility.  Family support groups. These provide education, emotional support, and information about community resources to family members who are taking care of the person with this disease.   This information is not intended to replace advice given to you by your health care provider. Make sure you discuss any questions you have with your health care provider.   Document Released: 10/22/2003 Document Revised: 03/02/2014 Document Reviewed: 06/17/2012 Elsevier Interactive Patient Education [employer].

## 2015-08-09 ENCOUNTER — Telehealth: Payer: Self-pay | Admitting: Neurology

## 2015-08-09 NOTE — Telephone Encounter (Signed)
In case it may be side effect, I would have him stop Namzaric and continue Aricept 10mg  at bedtime.  He should contact us on Monday with update.  If he is feeling well, then I would continue Aricept and add Namenda separately to see how he does.

## 2015-08-09 NOTE — Telephone Encounter (Signed)
Yesterday pt was in office and  His  Aricept  Was stopped and  Namzaric started. Son called about possible side effects. States pt has been sick to him stomach and thrown up quite a few times today. Please advise.

## 2015-08-09 NOTE — Telephone Encounter (Signed)
Ricky Boyd [birth year]. His son [name] called concerned because his father has been throwing up quite a bit. He was in the office yesterday to see Dr. Tomi Likens. He was I believe put on a new medication. His son was wondering if maybe this was a side effect from the medication. He would like you to please call him at [phone]. Thank you

## 2015-08-12 NOTE — Telephone Encounter (Signed)
Spoke with son. Believes that pt accidentally took a double dose of the Namzaric on Friday. Has continued to take it over the weekend as directed with no issues.

## 2015-08-30 ENCOUNTER — Telehealth: Payer: Self-pay | Admitting: *Deleted

## 2015-08-30 MED ORDER — OXYBUTYNIN CHLORIDE 5 MG PO TABS: 5.0000 mg | ORAL_TABLET | Freq: Every day | ORAL | Status: DC

## 2015-08-30 NOTE — Telephone Encounter (Signed)
eRx refilled as requested.

## 2015-08-30 NOTE — Telephone Encounter (Signed)
Pt is requesting a refill of oxybutynin chloride 5mg . Medication is not on pts current med list and was shown to have been d/c by alternate office. pls advise

## 2015-09-04 ENCOUNTER — Other Ambulatory Visit: Payer: Self-pay

## 2015-09-04 NOTE — Telephone Encounter (Signed)
Pt walked in the office yesterday and asked for a refill of something to help him get an erection. Wants a pill prescribed. I found the request on the triage desk today.  I called and spoke to the patient. Advised him Dr Deborra Medina had called in a rx for Sildenafil on 07-23-15. He looked and found the medication!!

## 2015-09-11 ENCOUNTER — Ambulatory Visit (INDEPENDENT_AMBULATORY_CARE_PROVIDER_SITE_OTHER): Payer: Medicare Other | Admitting: Family Medicine

## 2015-09-11 ENCOUNTER — Encounter: Payer: Self-pay | Admitting: Family Medicine

## 2015-09-11 VITALS — BP 116/70 | HR 72 | Temp 98.6°F | Ht 70.0 in | Wt 199.5 lb

## 2015-09-11 DIAGNOSIS — W57XXXA Bitten or stung by nonvenomous insect and other nonvenomous arthropods, initial encounter: Secondary | ICD-10-CM

## 2015-09-11 DIAGNOSIS — T148 Other injury of unspecified body region: Secondary | ICD-10-CM

## 2015-09-11 MED ORDER — DOXYCYCLINE HYCLATE 100 MG PO TABS: 100.0000 mg | ORAL_TABLET | Freq: Two times a day (BID) | ORAL | Status: DC

## 2015-09-11 NOTE — Patient Instructions (Signed)
The pink spot is 2cm across.  If it gets bigger, redder, or more painful, then start the antibiotics.  If you have a fever or a rash in another spot, then start the antibiotics.  Otherwise this small area in your right armpit should slowly heal over.  Take care.  Glad to see you.  Update [place] as needed.

## 2015-09-11 NOTE — Progress Notes (Signed)
Pre visit review using our clinic review tool, if applicable. No additional management support is needed unless otherwise documented below in the visit note.  No recent use of diclofenac.  Med list updated re: current meds.   Tick bite.  Bite noted 2 days ago.  R axilla.  Tick wasn't engorged.  Fully removed.  No FCNAVD.  Feels like his usual baseline self .   Local irritation.  Not itchy or painful but red locally.  No other rash.    Noted per patient, that when he has a tick bite in the past he'll usually have a small amount of local irritation similar to today/this episode.    Meds, vitals, and allergies reviewed.   ROS: Per HPI unless specifically indicated in ROS section   nad ncat Neck supple, no LA No clavicular LA No axillary LA Local irritation in the axilla, 2 cm of pinkish skin.  No fluctuant mass.  No drainage, no ulceration.   rrr ctab

## 2015-09-12 DIAGNOSIS — W57XXXA Bitten or stung by nonvenomous insect and other nonvenomous arthropods, initial encounter: Secondary | ICD-10-CM | POA: Insufficient documentation

## 2015-09-12 NOTE — Assessment & Plan Note (Signed)
Tick wasn't engorged.  Feels well.  Minimal local irritation.  Noted per patient, that when he has a tick bite in the past he'll usually have a small amount of local irritation similar to today/this episode.   Hold doxy for now.  See AVS.  Likely that tx at this point with doxy risk>benefit, he agrees.

## 2015-10-25 ENCOUNTER — Other Ambulatory Visit: Payer: Self-pay | Admitting: Family Medicine

## 2015-10-25 NOTE — Telephone Encounter (Signed)
Pt left note requesting refill sildenafil to [place]. Pt last seen and rx refilled # 10 on 07/23/15.Please advise.

## 2015-10-25 NOTE — Telephone Encounter (Signed)
PLEASE NOTE: All timestamps contained within this report are represented as [place] Standard Time. CONFIDENTIALTY NOTICE: This fax transmission is intended only for the addressee. It contains information that is legally privileged, confidential or otherwise protected from use or disclosure. If you are not the intended recipient, you are strictly prohibited from reviewing, disclosing, copying using or disseminating any of this information or taking any action in reliance on or regarding this information. If you have received this fax in error, please notify us immediately by telephone so that we can arrange for its return to [place]. Phone: [phone], Toll-Free: [phone], Fax: [phone] Page: 1 of 1 Call Id: [id number] [place] Patient Name: Ricky Boyd Gender: Male DOB: 24-Nov-1940 Age: 75 Y 29 M 4 D Return Phone Number: [phone] (Primary) Address: City/State/Zip: [place] Client [facility] - Client Client Site [place] - Day Physician Arnette Norris - MD Contact Type Call Who Is Calling Patient / Member / Family / Caregiver Call Type Triage / Clinical Relationship To Patient Self Return Phone Number [phone] (Primary) Chief Complaint Prescription Refill or Medication Request (non symptomatic) Reason for Call Symptomatic / Request for [place] states, he has run out of his rx. (erectile disfunction) Verified. Appointment Disposition EMR Appointment Not Necessary Info pasted into Epic No Translation No Nurse Assessment Nurse: Rock Nephew, RN, Marliss Coots Date/Time (Eastern Time): 10/24/2015 10:35:29 PM Please select the assessment type ---Refill Additional Documentation ---Caller states that he is needing a prescription refill. He needs his medicine for his erectile dysfunction. Does the patient have enough  medication to last until the office opens? ---No Additional Documentation ---Advised caller that the nurse will send the message into the office for his refill request for someone to handle tomorrow. Guidelines Guideline Title Affirmed Question Affirmed Notes Nurse Date/Time (Eastern Time) Disp. Time Eilene Ghazi Time) Disposition Final User 10/24/2015 10:42:47 PM Clinical Call Yes Rock Nephew, RN, Marliss Coots

## 2016-01-31 ENCOUNTER — Telehealth: Payer: Self-pay

## 2016-01-31 ENCOUNTER — Encounter: Payer: Self-pay | Admitting: Family Medicine

## 2016-01-31 ENCOUNTER — Ambulatory Visit (INDEPENDENT_AMBULATORY_CARE_PROVIDER_SITE_OTHER): Payer: Medicare Other | Admitting: Family Medicine

## 2016-01-31 ENCOUNTER — Ambulatory Visit (INDEPENDENT_AMBULATORY_CARE_PROVIDER_SITE_OTHER)
Admission: RE | Admit: 2016-01-31 | Discharge: 2016-01-31 | Disposition: A | Discharge status: Home / Self Care | Payer: Medicare Other | Source: Ambulatory Visit | Attending: Family Medicine | Admitting: Family Medicine

## 2016-01-31 VITALS — BP 130/78 | HR 84 | Temp 98.1°F | Wt 202.5 lb

## 2016-01-31 DIAGNOSIS — M25475 Effusion, left foot: Secondary | ICD-10-CM

## 2016-01-31 DIAGNOSIS — M79672 Pain in left foot: Secondary | ICD-10-CM

## 2016-01-31 NOTE — Telephone Encounter (Signed)
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 Ricky Boyd gave pt a colchicine that pt had from previous gout attack; Ricky Boyd gave pt another colchicine to take later today and Ricky Boyd wanted to make sure it is OK for pt to take the med. Ricky Boyd request cb.

## 2016-01-31 NOTE — Progress Notes (Signed)
Pre visit review using our clinic review tool, if applicable. No additional management support is needed unless otherwise documented below in the visit note. 

## 2016-01-31 NOTE — Patient Instructions (Signed)
Please elevate your foot when you are sitting, can apply heat if it feels better For pain, can take tylenol 2 tablets every 8 to 12 hours or ibuprofen 2 tablets (400 mg total) every 8 to 12 hours I will notify you of your xray report

## 2016-01-31 NOTE — Telephone Encounter (Signed)
Called patient' son. Was not planning on giving second cochicine since he got appointment for this afternoon.

## 2016-01-31 NOTE — Progress Notes (Signed)
   Subjective:    Patient ID: Ricky Boyd, male    DOB: 02/24/1940, 75 y.o.   MRN: [id number]  HPI This is a 75 yo male, accompanied by his brother, who presents today with left foot pain x 3 days. No known injury. No recent falls. Has had gout in past, but in left great toe. Son gave him one colchicine today without relief. Pain does not interfere with sleep. He is able to wear lace up shoe without difficulty.    Past Medical History:  Diagnosis Date  . Arthritis   . Cancer [facility])    Prostate  . Hearing aid worn   . Memory deficit    Past Surgical History:  Procedure Laterality Date  . COLONOSCOPY    . HEMORROIDECTOMY    . TONSILLECTOMY AND ADENOIDECTOMY     Family History  Problem Relation Age of Onset  . Colon cancer Neg Hx   . Diverticulitis Father   . Depression Brother    Social History  Substance Use Topics  . Smoking status: Former Research scientist (life sciences)  . Smokeless tobacco: Former Systems developer     Comment: quit 1963  . Alcohol use 3.6 oz/week    6 Cans of beer per week     Comment: occasionally      Review of Systems Per HPI    Objective:   Physical Exam  Constitutional: He appears well-developed and well-nourished. No distress.  HENT:  Head: Normocephalic and atraumatic.  Eyes: Conjunctivae are normal.  Cardiovascular: Normal rate.   Pulmonary/Chest: Effort normal.  Musculoskeletal:       Right foot: There is tenderness and swelling. There is normal range of motion, normal capillary refill and no crepitus.  Left foot is warm and dry, normal color, +3 DP/PT, good ROM, pain with palpation along lateral foot, mild swelling without discoloration.   Neurological: He is alert.  Skin: Skin is warm and dry. He is not diaphoretic.  Psychiatric: He has a normal mood and affect. His behavior is normal. Judgment and thought content normal.  Vitals reviewed.     BP 130/78   Pulse 84   Temp 98.1 F (36.7 C) (Oral)   Wt 202 lb 8 oz (91.9 kg)   BMI 29.06 kg/m  Wt Readings from  Last 3 Encounters:  01/31/16 202 lb 8 oz (91.9 kg)  09/11/15 199 lb 8 oz (90.5 kg)  08/08/15 191 lb (86.6 kg)       Assessment & Plan:  1. Left foot pain - DG Foot Complete Left; Future  2. Swelling of foot joint, left - DG Foot Complete Left; Future - not sure cause of pain/swelling, will check xray  - tylenol, elevate, heat prn - RTC if worsening symptoms or if no improvement in 5-7 days  Clarene Reamer, FNP-BC  [facility], [place]  01/31/2016 3:03 PM

## 2016-02-03 ENCOUNTER — Encounter: Payer: Self-pay | Admitting: Neurology

## 2016-02-27 ENCOUNTER — Ambulatory Visit (INDEPENDENT_AMBULATORY_CARE_PROVIDER_SITE_OTHER): Payer: Medicare Other | Admitting: Family Medicine

## 2016-02-27 ENCOUNTER — Encounter: Payer: Self-pay | Admitting: Family Medicine

## 2016-02-27 ENCOUNTER — Telehealth: Payer: Self-pay

## 2016-02-27 VITALS — BP 134/74 | HR 97 | Temp 97.6°F | Wt 201.5 lb

## 2016-02-27 DIAGNOSIS — G3184 Mild cognitive impairment, so stated: Secondary | ICD-10-CM | POA: Diagnosis not present

## 2016-02-27 DIAGNOSIS — R413 Other amnesia: Secondary | ICD-10-CM | POA: Diagnosis not present

## 2016-02-27 MED ORDER — MEMANTINE HCL-DONEPEZIL HCL ER 28-10 MG PO CP24: 28.0000 mg | ORAL_CAPSULE | Freq: Every day | ORAL | 5 refills | Status: DC

## 2016-02-27 MED ORDER — SILDENAFIL CITRATE 20 MG PO TABS: 20.0000 mg | ORAL_TABLET | ORAL | 0 refills | Status: DC | PRN

## 2016-02-27 NOTE — Assessment & Plan Note (Signed)
>  25 minutes spent in face to face time with patient, >50% spent in counselling or coordination of care Memory seems stable. eRx refill sent.

## 2016-02-27 NOTE — Telephone Encounter (Signed)
I can understand why this would be worrisome.  Could certainly be part of the dementia and I would continue to reassure him.  We can have him see a psychiatrist if this continues.

## 2016-02-27 NOTE — Progress Notes (Signed)
Pre visit review using our clinic review tool, if applicable. No additional management support is needed unless otherwise documented below in the visit note. 

## 2016-02-27 NOTE — Patient Instructions (Signed)
Great to see you!  Happy New Year! 

## 2016-02-27 NOTE — Progress Notes (Signed)
Subjective:   Patient ID: Ricky Boyd, male    DOB: February 03, 1941, 76 y.o.   MRN: [id number]  Ricky Boyd is a pleasant 76 y.o. year old male who presents to clinic today with his son for Follow-up  on 02/27/2016  HPI:  Saw Dr. Tomi Likens on 08/08/15 for memory loss. Notes reviewed.  Aricept stopped and was started on Namzaric. Also discussed HH to come to assess safety and restricted driving to local areas.  Son and pt feel he is overall doing much better.   Current Outpatient Prescriptions on File Prior to Visit  Medication Sig Dispense Refill  . Ascorbic Acid (VITAMIN C) 500 MG CAPS Take 500 mg by mouth daily.     . diclofenac (VOLTAREN) 75 MG EC tablet Take 1 tablet (75 mg total) by mouth 2 (two) times daily. 30 tablet 0  . fish oil-omega-3 fatty acids 1000 MG capsule Take 1 g by mouth 2 (two) times daily.    Marland Kitchen glucosamine-chondroitin 500-400 MG tablet Take 1 tablet by mouth 2 (two) times daily.    . Multiple Vitamins-Minerals (MULTIVITAMIN WITH MINERALS) tablet Take 1 tablet by mouth daily.    Marland Kitchen oxybutynin (DITROPAN) 5 MG tablet Take 1 tablet (5 mg total) by mouth daily. 30 tablet 3  . saw palmetto 160 MG capsule Take 160 mg by mouth 2 (two) times daily.     No current facility-administered medications on file prior to visit.     Allergies  Allergen Reactions  . Indocin [Indomethacin] Other (See Comments)    Confusion, short term loss of memory  . Amoxicillin-Pot Clavulanate Other (See Comments)    Bad Dreams  . Erythromycin Ethylsuccinate Other (See Comments)     irritates eyes  . Ibuprofen Other (See Comments)    Confusion on 800mg   Tolerates smaller doses    Past Medical History:  Diagnosis Date  . Arthritis   . Cancer [facility])    Prostate  . Hearing aid worn   . Memory deficit     Past Surgical History:  Procedure Laterality Date  . COLONOSCOPY    . HEMORROIDECTOMY    . TONSILLECTOMY AND ADENOIDECTOMY      Family History  Problem Relation Age of Onset  .  Colon cancer Neg Hx   . Diverticulitis Father   . Depression Brother     Social History   Social History  . Marital status: Single    Spouse name: N/A  . Number of children: 3  . Years of education: Master's   Occupational History  . Not on file.   Social History Main Topics  . Smoking status: Former Research scientist (life sciences)  . Smokeless tobacco: Former Systems developer     Comment: quit 1963  . Alcohol use 3.6 oz/week    6 Cans of beer per week     Comment: occasionally  . Drug use: No  . Sexual activity: Not on file   Other Topics Concern  . Not on file   Social History Narrative   Desires CPR.      Would desire life support and and possibly feeding tube.      Patient currently lives alone- 2nd wife killed herself. Lives in one story house.    Patient is left handed.   Patient has a Scientist, water quality.    The PMH, PSH, Social History, Family History, Medications, and allergies have been reviewed in [facility], and have been updated if relevant.     Review of Systems  Constitutional: Negative.  Psychiatric/Behavioral: Negative for confusion, decreased concentration, dysphoric mood, self-injury, sleep disturbance and suicidal ideas. The patient is not nervous/anxious.   All other systems reviewed and are negative.      Objective:    BP 134/74   Pulse 97   Temp 97.6 F (36.4 C) (Oral)   Wt 201 lb 8 oz (91.4 kg)   BMI 28.91 kg/m    Physical Exam  Constitutional: He is oriented to person, place, and time. He appears well-developed and well-nourished. No distress.  HENT:  Head: Normocephalic and atraumatic.  Eyes: Conjunctivae are normal.  Cardiovascular: Normal rate and regular rhythm.   Pulmonary/Chest: Effort normal and breath sounds normal.  Musculoskeletal: Normal range of motion. He exhibits no edema.  Neurological: He is alert and oriented to person, place, and time. No cranial nerve deficit. Coordination normal.  Skin: Skin is warm and dry. He is not diaphoretic.  Psychiatric: He  has a normal mood and affect. His behavior is normal. Judgment and thought content normal.  Nursing note and vitals reviewed.         Assessment & Plan:   Memory loss  Amnestic MCI (mild cognitive impairment with memory loss) No Follow-up on file.

## 2016-02-27 NOTE — Telephone Encounter (Signed)
Ricky Boyd pts son left v/m; pt was seen earlier today;Steve forgot to mention at appt that pt is suffering from mild paranoia for example; pt cuts home alarm on and off each time he goes in and out of house and pt concerned about having enough fire wood so pt will not get cold even thou pt has enough firewood to last 20 years. Ricky Boyd request cb after Dr Deborra Medina reviews.

## 2016-02-28 NOTE — Telephone Encounter (Signed)
Lm on pt's son's vm requesting a call back

## 2016-03-02 NOTE — Telephone Encounter (Signed)
Spoke to pts son and advised per Dr Deborra Medina.

## 2016-03-04 ENCOUNTER — Encounter: Payer: Self-pay | Admitting: Family Medicine

## 2016-03-06 ENCOUNTER — Ambulatory Visit (INDEPENDENT_AMBULATORY_CARE_PROVIDER_SITE_OTHER): Payer: Medicare Other | Admitting: Family Medicine

## 2016-03-06 ENCOUNTER — Encounter: Payer: Self-pay | Admitting: Family Medicine

## 2016-03-06 DIAGNOSIS — J019 Acute sinusitis, unspecified: Secondary | ICD-10-CM | POA: Diagnosis not present

## 2016-03-06 MED ORDER — CLARITHROMYCIN 500 MG PO TABS: 500.0000 mg | ORAL_TABLET | Freq: Two times a day (BID) | ORAL | 0 refills | Status: DC

## 2016-03-06 NOTE — Progress Notes (Signed)
Pre visit review using our clinic review tool, if applicable. No additional management support is needed unless otherwise documented below in the visit note. 

## 2016-03-06 NOTE — Patient Instructions (Addendum)
Start nasal saline spray 2-3 times daily.  Flonase OTC  2 sprays pre nostril daily.  MUcinex DM twice daily.  If not improving in 3-4 days fill prescription for antibitoics.

## 2016-03-06 NOTE — Progress Notes (Signed)
   Subjective:    Patient ID: Ricky Boyd, male    DOB: 03/05/1940, 76 y.o.   MRN: [id number]  HPI    76 year old male pt of Dr. Hulen Shouts with history of  Interstitial lung disease and MCI presents with new onset 3 weeks of sinus  Congestion, facial pressure. No ear pain, no tooth pain. Fatigued. Cough intermittently, no SOB, no wheezing. Cough not keeping him up at night. Chills initially.. No fever.  Mucinex DM.Marland Kitchen minimmal improvement.  Former smoker   Social History /Family History/Past Medical History reviewed and updated if needed.  Vitals:   03/06/16 1612  BP: (!) 148/79  Pulse: (!) 101  Temp: 97.3 F (36.3 C)    Review of Systems  Constitutional: Positive for fatigue.  HENT: Negative for ear pain.   Eyes: Negative for pain.  Respiratory: Negative for shortness of breath.   Cardiovascular: Negative for chest pain.  Gastrointestinal: Negative for abdominal distention.       Objective:   Physical Exam  Constitutional: Vital signs are normal. He appears well-developed and well-nourished.  Non-toxic appearance. He does not appear ill. No distress.  HENT:  Head: Normocephalic and atraumatic.  Right Ear: Hearing, tympanic membrane, external ear and ear canal normal. No tenderness. No foreign bodies. Tympanic membrane is not retracted and not bulging.  Left Ear: Hearing, tympanic membrane, external ear and ear canal normal. No tenderness. No foreign bodies. Tympanic membrane is not retracted and not bulging.  Nose: Nose normal. No mucosal edema or rhinorrhea. Right sinus exhibits no maxillary sinus tenderness and no frontal sinus tenderness. Left sinus exhibits no maxillary sinus tenderness and no frontal sinus tenderness.  Mouth/Throat: Uvula is midline, oropharynx is clear and moist and mucous membranes are normal. Normal dentition. No dental caries. No oropharyngeal exudate or tonsillar abscesses.  Eyes: Conjunctivae, EOM and lids are normal. Pupils are equal, round, and  reactive to light. Lids are everted and swept, no foreign bodies found.  Neck: Trachea normal, normal range of motion and phonation normal. Neck supple. Carotid bruit is not present. No thyroid mass and no thyromegaly present.  Cardiovascular: Normal rate, regular rhythm, S1 normal, S2 normal, normal heart sounds, intact distal pulses and normal pulses.  Exam reveals no gallop.   No murmur heard. Pulmonary/Chest: Effort normal and breath sounds normal. No respiratory distress. He has no wheezes. He has no rhonchi. He has no rales.  Abdominal: Soft. Normal appearance and bowel sounds are normal. There is no hepatosplenomegaly. There is no tenderness. There is no rebound, no guarding and no CVA tenderness. No hernia.  Neurological: He is alert. He has normal reflexes.  Skin: Skin is warm, dry and intact. No rash noted.  Psychiatric: He has a normal mood and affect. His speech is normal and behavior is normal. Judgment normal.          Assessment & Plan:

## 2016-03-06 NOTE — Assessment & Plan Note (Signed)
Given length of time with symptoms.. Bacterial possible but given minor symptoms..viral more likely. Will treat with nasal flonase , saline irrigation .Marland Kitchen If not improving then can fill rx for antibiotics.

## 2016-03-23 ENCOUNTER — Other Ambulatory Visit: Payer: Self-pay | Admitting: Urology

## 2016-03-23 DIAGNOSIS — C61 Malignant neoplasm of prostate: Secondary | ICD-10-CM

## 2016-03-31 ENCOUNTER — Encounter (HOSPITAL_COMMUNITY)
Admission: RE | Admit: 2016-03-31 | Discharge: 2016-03-31 | Disposition: A | Discharge status: Home / Self Care | Payer: Medicare Other | Source: Ambulatory Visit | Attending: Urology | Admitting: Urology

## 2016-03-31 DIAGNOSIS — C61 Malignant neoplasm of prostate: Secondary | ICD-10-CM | POA: Insufficient documentation

## 2016-03-31 MED ORDER — TECHNETIUM TC 99M MEDRONATE IV KIT
25.0000 | PACK | Freq: Once | INTRAVENOUS | Status: AC | PRN
  Administered 2016-03-31: 21.9 via INTRAVENOUS

## 2016-04-02 ENCOUNTER — Encounter: Payer: Self-pay | Admitting: Internal Medicine

## 2016-04-03 ENCOUNTER — Telehealth: Payer: Self-pay

## 2016-04-03 NOTE — Telephone Encounter (Signed)
Pt left v/m requesting a cb; that was entire message. I cb and spoke with [name] the son and he said pt was having a lot of pain in rt foot today; earlier was so painful pt had difficulty bearing wt on rt foot. [name] said was not sure if there was and injury or not. Advised for pt to go to ED or UC with xray equipment in case needs xray since not sure if had an injury. [name] said he would talk with pt and take him somewhere today. FYI to Dr Deborra Medina.

## 2016-04-06 ENCOUNTER — Encounter: Payer: Self-pay | Admitting: Internal Medicine

## 2016-04-06 NOTE — Telephone Encounter (Signed)
Please call to check on pt. 

## 2016-04-07 NOTE — Telephone Encounter (Signed)
Lm on pts vm requesting a call back 

## 2016-04-08 ENCOUNTER — Ambulatory Visit (INDEPENDENT_AMBULATORY_CARE_PROVIDER_SITE_OTHER): Payer: Medicare Other | Admitting: Primary Care

## 2016-04-08 ENCOUNTER — Encounter: Payer: Self-pay | Admitting: Primary Care

## 2016-04-08 VITALS — BP 142/86 | HR 74 | Temp 97.4°F | Ht 69.5 in | Wt 195.4 lb

## 2016-04-08 DIAGNOSIS — M79671 Pain in right foot: Secondary | ICD-10-CM | POA: Diagnosis not present

## 2016-04-08 DIAGNOSIS — M79672 Pain in left foot: Secondary | ICD-10-CM

## 2016-04-08 NOTE — Progress Notes (Signed)
Pre visit review using our clinic review tool, if applicable. No additional management support is needed unless otherwise documented below in the visit note. 

## 2016-04-08 NOTE — Patient Instructions (Signed)
There is no fracture or gout flare to your feet.  Try taking ibuprofen 400 mg three times daily as needed for pain and inflammation.  Rest and elevate your feet when sitting.  It was a pleasure meeting you!

## 2016-04-08 NOTE — Progress Notes (Signed)
Subjective:    Patient ID: Ricky Boyd, male    DOB: Mar 03, 1940, 76 y.o.   MRN: [id number]  HPI  Mr. Saulters is a 76 year old male with a history of gout and mild cognitive impairment who presents today with a chief complaint of foot pain. His pain is located to the lateral/dorsal side of bilateral feet, sometimes to the plantar surface. His pain began 2 weeks ago. He's taken ibuprofen once with some improvement. He denies recent injury/trauma, redness, chest pain, shortness of breath, erythema to his skin, numbness/tingling. Overall his symptoms are improving.   Review of Systems  Respiratory: Negative for shortness of breath.   Cardiovascular: Negative for chest pain.  Musculoskeletal:       Bilateral foot pain  Skin: Negative for color change and wound.  Neurological: Negative for weakness and numbness.       Past Medical History:  Diagnosis Date  . Arthritis   . Cancer [facility])    Prostate  . Hearing aid worn   . Memory deficit      Social History   Social History  . Marital status: Single    Spouse name: N/A  . Number of children: 3  . Years of education: Master's   Occupational History  . Not on file.   Social History Main Topics  . Smoking status: Former Research scientist (life sciences)  . Smokeless tobacco: Former Systems developer     Comment: quit 1963  . Alcohol use 3.6 oz/week    6 Cans of beer per week     Comment: occasionally  . Drug use: No  . Sexual activity: Not on file   Other Topics Concern  . Not on file   Social History Narrative   Desires CPR.      Would desire life support and and possibly feeding tube.      Patient currently lives alone- 2nd wife killed herself. Lives in one story house.    Patient is left handed.   Patient has a Scientist, water quality.     Past Surgical History:  Procedure Laterality Date  . COLONOSCOPY    . HEMORROIDECTOMY    . TONSILLECTOMY AND ADENOIDECTOMY      Family History  Problem Relation Age of Onset  . Colon cancer Neg Hx   . Diverticulitis  Father   . Depression Brother     Allergies  Allergen Reactions  . Indocin [Indomethacin] Other (See Comments)    Confusion, short term loss of memory  . Amoxicillin-Pot Clavulanate Other (See Comments)    Bad Dreams  . Erythromycin Ethylsuccinate Other (See Comments)     irritates eyes  . Ibuprofen Other (See Comments)    Confusion on 800mg   Tolerates smaller doses    Current Outpatient Prescriptions on File Prior to Visit  Medication Sig Dispense Refill  . Ascorbic Acid (VITAMIN C) 500 MG CAPS Take 500 mg by mouth daily.     . clarithromycin (BIAXIN) 500 MG tablet Take 1 tablet (500 mg total) by mouth 2 (two) times daily. Fill if not improving in next 3-4 days or if new fever.  VOID after 04/09/2016 20 tablet 0  . diclofenac (VOLTAREN) 75 MG EC tablet Take 1 tablet (75 mg total) by mouth 2 (two) times daily. 30 tablet 0  . fish oil-omega-3 fatty acids 1000 MG capsule Take 1 g by mouth 2 (two) times daily.    Marland Kitchen glucosamine-chondroitin 500-400 MG tablet Take 1 tablet by mouth 2 (two) times daily.    Marland Kitchen  Memantine HCl-Donepezil HCl (NAMZARIC) 28-10 MG CP24 Take 28 mg by mouth daily. 30 capsule 5  . Multiple Vitamins-Minerals (MULTIVITAMIN WITH MINERALS) tablet Take 1 tablet by mouth daily.    Marland Kitchen oxybutynin (DITROPAN) 5 MG tablet Take 1 tablet (5 mg total) by mouth daily. 30 tablet 3  . saw palmetto 160 MG capsule Take 160 mg by mouth 2 (two) times daily.    . sildenafil (REVATIO) 20 MG tablet Take 1 tablet (20 mg total) by mouth as needed. 10 tablet 0   No current facility-administered medications on file prior to visit.     BP (!) 142/86   Pulse 74   Temp 97.4 F (36.3 C) (Oral)   Ht 5' 9.5" (1.765 m)   Wt 195 lb 6.4 oz (88.6 kg)   SpO2 98%   BMI 28.44 kg/m    Objective:   Physical Exam  Cardiovascular:  Pulses:      Dorsalis pedis pulses are 2+ on the right side, and 2+ on the left side.       Posterior tibial pulses are 2+ on the right side, and 2+ on the left side.    Musculoskeletal:       Right ankle: He exhibits normal range of motion and normal pulse. No tenderness.       Left ankle: He exhibits normal range of motion. No tenderness.       Right foot: There is normal range of motion, no tenderness, no swelling and no deformity.       Left foot: There is normal range of motion, no tenderness, no bony tenderness, no swelling and no deformity.  Skin: Skin is warm and dry. No erythema.          Assessment & Plan:  Foot Pain:  Located to bilateral lateral/dorsal feet x 2 weeks. No trauma/injury. Exam today with 2+ DP and PT pulses, no evidence for gout flare, no deformity or signs of trauma, no evidence for PVD. Doesn't seem to be plantar fasciitis involvement. Good ROM. Ambulates well. Will have him start ibuprofen TID PRN as he's improved overall. He will update if no improvement.  Sheral Flow, NP

## 2016-04-16 ENCOUNTER — Encounter: Payer: Self-pay | Admitting: Radiation Oncology

## 2016-04-23 ENCOUNTER — Ambulatory Visit
Admission: RE | Admit: 2016-04-23 | Discharge: 2016-04-23 | Disposition: A | Discharge status: Home / Self Care | Payer: Medicare Other | Source: Ambulatory Visit | Attending: Radiation Oncology | Admitting: Radiation Oncology

## 2016-04-23 ENCOUNTER — Encounter: Payer: Self-pay | Admitting: Medical Oncology

## 2016-04-23 ENCOUNTER — Encounter: Payer: Self-pay | Admitting: Radiation Oncology

## 2016-04-23 DIAGNOSIS — Z510 Encounter for antineoplastic radiation therapy: Secondary | ICD-10-CM | POA: Diagnosis not present

## 2016-04-23 DIAGNOSIS — Z87891 Personal history of nicotine dependence: Secondary | ICD-10-CM | POA: Diagnosis not present

## 2016-04-23 DIAGNOSIS — C61 Malignant neoplasm of prostate: Secondary | ICD-10-CM

## 2016-04-23 DIAGNOSIS — Z880 Allergy status to penicillin: Secondary | ICD-10-CM | POA: Insufficient documentation

## 2016-04-23 HISTORY — DX: Malignant neoplasm of prostate: C61

## 2016-04-23 NOTE — Progress Notes (Signed)
See progress note under physician encounter. 

## 2016-04-23 NOTE — Progress Notes (Signed)
GU Location of Tumor / Histology: prostatic adenocarcinoma  If Prostate Cancer, Gleason Score is (4 + 4) and PSA is (7.95) May 2017  Ricky Boyd underwent ultrasound and biopsy of his prostate in November 2014, Gleason 3+3. He opted for active surveillance at that time.  Repeat biopsy of prostate done 03/04/16 revealed:  Past/Anticipated interventions by urology, if any: biopsy x 2, bone scan (neg), CT scan (neg), discussion about ADT, referral to Dr. Tammi Klippel  Past/Anticipated interventions by medical oncology, if any: no  Weight changes, if any: no  Bowel/Bladder complaints, if any: IPSS 10. Denies dysuria, hematuria or bowel complaints. Reports occasional incontinence. Reports at times its hard to postpone urination. Reports urinary hesitancy. Denies any bowel complaints.  Nausea/Vomiting, if any: no  Pain issues, if any:  Reports brief low back pain when he lays down to sleep at night.  SAFETY ISSUES:  Prior radiation? No  Pacemaker/ICD? No  Possible current pregnancy? no  Is the patient on methotrexate? no  Current Complaints / other details:  76 year old male. Widowed. Retired. Has Alzheimer's

## 2016-04-23 NOTE — Progress Notes (Signed)
Radiation Oncology         [phone]) [phone] ________________________________  Initial outpatient Consultation  Name: Ricky Boyd MRN: [id number]  Date: 04/23/2016  DOB: November 12, 1940  [id number] Deborra Medina, MD  Franchot Gallo, MD   REFERRING PHYSICIAN: Franchot Gallo, MD  DIAGNOSIS: 76 y.o. 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   HISTORY OF PRESENT ILLNESS:Ricky Boyd is a 76 y.o. gentleman with a history of adenocarcinoma of the prostate originally diagnosed in November 2014, when he was found to have a Gleason score 3+3 in one core. This was involving the left apex and only 10% of the core was involved. His PSA at that time was 6.1, his prostatic volume was 32 mL. He was followed in active surveillance and underwent repeat biopsy on 03/04/2016 revealing 8 out of 12 cores involved with adenocarcinoma 1 with 3+3 Gleason score, 3 with 3+4 Gleason score, and 3 with 4+3, in one core revealing 4+4. His PSA in November 2017 was 5.86, and prior to this in May 2017 with 7.95.  He has undergone metastatic workup with CT scan of the abdomen and pelvis and bone scan on 03/31/2016 which did not reveal any evidence of metastatic disease. He comes today to discuss the rationale for radiotherapy as means for treating her cancer.    He has undergone metastatic workup with CT scan of the abdomen and pelvis and bone scan on 03/31/2016 which did not reveal any evidence of metastatic disease. He comes today to discuss the rationale for radiotherapy as means for treating her cancer.    PREVIOUS RADIATION THERAPY: No Past Medical History:  Past Medical History:  Diagnosis Date  . Arthritis   . Hearing aid worn   . Memory deficit   . Prostate cancer [facility])     Past Surgical History: Past Surgical History:  Procedure Laterality Date  . COLONOSCOPY    . HEMORROIDECTOMY    . PROSTATE BIOPSY    . TONSILLECTOMY AND ADENOIDECTOMY       Social History:  Social History   Social History  . Marital status: Single    Spouse name: N/A  . Number of children: 3  . Years of education: Master's   Occupational History  . Not on file.   Social History Main Topics  . Smoking status: Former Smoker    Packs/day: 0.25    Years: 10.00    Types: Cigarettes    Quit date: 02/23/1958  . Smokeless tobacco: Former Systems developer     Comment: quit 1963  . Alcohol use 3.6 oz/week    6 Cans of beer per week     Comment: occasionally  . Drug use: No  . Sexual activity: Yes   Other Topics Concern  . Not on file   Social History Narrative   Desires CPR.      Would desire life support and and possibly feeding tube.      Patient currently lives alone- 2nd wife killed herself. Lives in one story house.    Patient is left handed.   Patient has a Scientist, water quality.     Family History: Family History  Problem Relation Age of Onset  . Diverticulitis Father   . Depression Brother   . Cancer Paternal Uncle   . Colon cancer Neg Hx     ALLERGIES: Indocin [indomethacin]; Amoxicillin-pot clavulanate; Erythromycin ethylsuccinate; and Ibuprofen  MEDICATIONS:  Current Outpatient Prescriptions  Medication Sig Dispense Refill  .  Ascorbic Acid (VITAMIN C) 500 MG CAPS Take 500 mg by mouth daily.     . fish oil-omega-3 fatty acids 1000 MG capsule Take 1 g by mouth 2 (two) times daily.    Marland Kitchen glucosamine-chondroitin 500-400 MG tablet Take 1 tablet by mouth 2 (two) times daily.    . Memantine HCl-Donepezil HCl (NAMZARIC) 28-10 MG CP24 Take 28 mg by mouth daily. 30 capsule 5  . Multiple Vitamins-Minerals (MULTIVITAMIN WITH MINERALS) tablet Take 1 tablet by mouth daily.    . saw palmetto 160 MG capsule Take 160 mg by mouth 2 (two) times daily.    . sildenafil (REVATIO) 20 MG tablet Take 1 tablet (20 mg total) by mouth as needed. 10 tablet 0   No current facility-administered medications for this encounter.     REVIEW OF SYSTEMS: On review of  systems, the patient reports that he is doing well overall. He denies any chest pain, shortness of breath, cough, fevers, chills, night sweats, unintended weight changes. He denies any bowel  disturbances, and denies abdominal pain, nausea or vomiting. He denies any new musculoskeletal or joint aches or pains, new skin lesions or concerns. He notes continued back pain. Patient notes urinary hesitancy and diarrhea. The patient completed an IPSS and IIEF questionnaire.  His IPSS score was 10 indicating moderate urinary outflow obstructive symptoms. He denies dysuria, hematuria, or bowel complaints. He reports occasional incontinence. Reports at times, it is difficult to postpone urination. Reports urinary hesitancy. He indicated that his erectile function is able to complete sexual activity about half of the time. A complete review of systems is obtained and is otherwise negative.    PHYSICAL EXAM: This patient is in no acute distress.  He is alert and oriented.   weight is 202 lb 6.4 oz (91.8 kg). His blood pressure is 142/75 (abnormal) and his pulse is 62. His respiration is 18 and oxygen saturation is 95%.  He exhibits no respiratory distress or labored breathing.  He appears neurologically intact.  His mood is pleasant.  His affect is appropriate.  Please note the digital rectal exam findings described above.  KPS = 100  100 - Normal; no complaints; no evidence of disease. 90   - Able to carry on normal activity; minor signs or symptoms of disease. 80   - Normal activity with effort; some signs or symptoms of disease. 48   - Cares for self; unable to carry on normal activity or to do active work. 60   - Requires occasional assistance, but is able to care for most of his personal needs. 50   - Requires considerable assistance and frequent medical care. 33   - Disabled; requires special care and assistance. 69   - Severely disabled; hospital admission is indicated although death not imminent. 40   -  Very sick; hospital admission necessary; active supportive treatment necessary. 10   - Moribund; fatal processes progressing rapidly. 0     - Dead  Karnofsky DA, Abelmann [place], Craver LS and Burchenal [facility] [phone]) The use of the nitrogen mustards in the palliative treatment of carcinoma: with particular reference to bronchogenic carcinoma Cancer 1 [phone]   LABORATORY DATA:  Lab Results  Component Value Date   WBC 6.8 07/21/2013   HGB 15.2 07/21/2013   HCT 43.4 07/21/2013   MCV 89.7 07/21/2013   PLT 121 (L) 07/21/2013   Lab Results  Component Value Date   NA 139 04/02/2014   K 4.0 04/02/2014   CL 104 04/02/2014  CO2 28 04/02/2014   Lab Results  Component Value Date   ALT 18 04/02/2014   AST 25 04/02/2014   ALKPHOS 64 04/02/2014   BILITOT 0.5 04/02/2014     RADIOGRAPHY: Nm Bone Scan Whole Body  Result Date: 03/31/2016 CLINICAL DATA:  History of prostate carcinoma, low back pain EXAM: NUCLEAR MEDICINE WHOLE BODY BONE SCAN TECHNIQUE: Whole body anterior and posterior images were obtained approximately 3 hours after intravenous injection of radiopharmaceutical. RADIOPHARMACEUTICALS:  21.9 mCi Technetium-49m MDP IV COMPARISON:  CT abdomen and pelvis of 03/31/2016 FINDINGS: No abnormal uptake of the radionuclide is seen throughout the skeleton. Mild degenerative change is noted in the knees, left-greater-than-right. IMPRESSION: No evidence of bone metastasis. Electronically Signed   By: Ivar Drape M.D.   On: 03/31/2016 17:25      IMPRESSION/PLAN: 1. 76 y.o. gentleman with high risk, T1c, adenocarcinoma of the prostate with a Gleason score of 4+4, and a PSA of 5.6. I discussed the findings from his pathology specimens, his PSA, and considers his biopsy from 2014. He discusses with the patient the nature of high risk prostate cancer, and the rationale for consideration of radiotherapy to the prostate. His age prohibits him from being a surgical candidate at this point. We discussed the  multimodality form of treatment with androgen deprivation for 2 years time, in conjunction with external radiotherapy. We reviewed the risks, benefits, short and long-term effects of radiotherapy in this setting, and the patient is interested in moving forward. We will share this with Dr. Vernie Shanks, and proceed with initiation of androgen deprivation. 2 months following administration, he will return for simulation after undergoing fiducial marker placement, anticipated course of 8 weeks of external beam radiotherapy. We also discussed the options for consideration of 5 weeks of external beam radiotherapy plus radioactive seed implant as a boost. The patient considers his options and at the end of discussion sides that he would like to move forward with external radiation with hormonal therapy.   ------------------------------------------------  Sheral Apley. Tammi Klippel, M.D.  This document serves as a record of services personally performed by Tyler Pita, MD. It was created on his behalf by Bethann Humble, a trained medical scribe. The creation of this record is based on the scribe's personal observations and the provider's statements to them. This document has been checked and approved by the attending provider.

## 2016-04-23 NOTE — Progress Notes (Signed)
Introduced myself to Mr. Masoner and his son as the prostate nurse navigator and discussed my role. He has elected to get androgen deprivation for 2 years with radiation. He will return in approximately 2 months for CT simulation and radiation. I gave the son my business card and asked him to call me with any questions or concerns. He voiced understanding.

## 2016-04-30 ENCOUNTER — Telehealth: Payer: Self-pay | Admitting: *Deleted

## 2016-04-30 NOTE — Telephone Encounter (Signed)
CALLED PATIENT TO INFORM OF HORMONE INJ. FOR 05-11-16 @ 1:30 PM @ DR. DAHLSTEDT'S OFFICE AND HIS GOLD SEED PLACEMENT ON 07-01-16 @ 2 PM @ DR. DAHLSTEDT'S OFFICE AND HIS SIM ON 07-10-16 @ 9 AM @ DR. MANNING'S OFFICE, SPOKE WITH PATIENT AND HE IS AWARE OF THESE APPTS.

## 2016-05-07 ENCOUNTER — Ambulatory Visit: Payer: Medicare Other | Admitting: Neurology

## 2016-05-21 ENCOUNTER — Encounter: Payer: Self-pay | Admitting: Neurology

## 2016-05-21 ENCOUNTER — Ambulatory Visit (INDEPENDENT_AMBULATORY_CARE_PROVIDER_SITE_OTHER): Payer: Medicare Other | Admitting: Neurology

## 2016-05-21 VITALS — BP 138/74 | HR 69 | Ht 69.5 in | Wt 199.0 lb

## 2016-05-21 DIAGNOSIS — F0280 Dementia in other diseases classified elsewhere without behavioral disturbance: Secondary | ICD-10-CM | POA: Diagnosis not present

## 2016-05-21 DIAGNOSIS — G301 Alzheimer's disease with late onset: Secondary | ICD-10-CM | POA: Diagnosis not present

## 2016-05-21 NOTE — Patient Instructions (Addendum)
1.  Continue the Namzaric daily 2.  Limit driving locally such as to the grocery store and your brother's house.   3.  Follow up in 9 months.   Alzheimer Disease Alzheimer disease is a brain disease that affects memory, thinking, and behavior. People with Alzheimer disease lose mental abilities, and the disease gets worse over time. Survival with Alzheimer disease ranges from several years to as long as 20 years. What are the causes? This condition develops when a protein called beta-amyloid forms deposits in the brain. It is not known what causes these deposits to form. What increases the risk? This condition is more likely to develop in people who:  Are elderly.  Have a family history of dementia.  Have had a brain injury.  Have heart or blood vessel disease.  Have had a stroke.  Have high blood pressure or high cholesterol.  Have diabetes. What are the signs or symptoms? Symptoms of this condition happen in three stages, which often overlap. Early stage  In this stage, you may continue to be independent. You may still be able to drive, work, and be social. Symptoms in this stage include:  Minor memory problems, such as forgetting a name or what you read.  Difficulty with:  Paying attention.  Communicating.  Doing familiar tasks.  Learning new things.  Needing more time to do daily activities.  Anxiety.  Social withdrawal.  Loss of motivation. Moderate stage  In this stage, you will start to need care. This stage usually lasts the longest. Symptoms in this stage include:  Difficulty with expressing thoughts.  Memory loss that affects daily life. This can include forgetting:  Your address or phone number.  Events that have happened.  Parts of your personal history, like where you went to school.  Confusion about where you are or what time it is.  Difficulty in judging distance.  Changes in personality, mood, and behavior. You may be moody, irritable,  angry, frustrated, fearful, anxious, or suspicious.  Poor reasoning and judgment.  Delusions or hallucinations.  Changes in sleep patterns.  Wandering and getting lost. Severe stage  In the final stage, you will need help with your personal care and dailyactivities. Symptoms in this stage include:  Worsening memory loss.  Personality changes.  Loss of awareness of your surroundings.  Changes in physical abilities, including the ability to walk, sit, and swallow.  Difficulty in communicating.  Inability to control the bladder and bowels.  Increasing confusion.  Increasing disruptive behavior. How is this diagnosed? This condition is diagnosed with an assessment by your health care provider. During this assessment, your health care provider will talk with you and your family, friends, or caregivers about your symptoms. A thorough medical history will be taken, and you will have a physical exam and tests. Tests may include:  Lab tests, such as blood or urine tests.  Imaging tests, such as a CT scan, PET scan, or MRI.  A lumbar puncture. This test involves removing and testing a small amount of the fluid that surrounds the brain and spinal cord.  An electroencephalogram (EEG). In this test, small metal discs are used to measure electrical activity in the brain.  Memory tests, cognitive tests, and neuropsychological tests. These tests evaluate brain function. How is this treated? At this time, there is no treatment to cure Alzheimer disease or stop it from getting worse. The goals of treatment are:  To slow down the disease.  To manage behavioral problems.  To provide  you with a safe environment.  To make life easier for you and your caregivers. The following treatment options are available:  Medicines. Medicines may help to slow down memory loss and control behavioral symptoms.  Talk therapy. Talk therapy provides you with education, support, and memory aids. It is  most helpful in the early stages of the condition.  Counseling or spiritual guidance. It is normal to have a lot of feelings, including anger, relief, fear, and isolation. Counseling and guidance can help you deal with these feelings.  Caregiving. This involves having caregivers help you with your daily activities. Caregivers may be family members, friends, or trained medical professionals. Caregiving can be done at home or outside the home.  Family support groups. These provide education, emotional support, and information about community resources to family members who are taking care of you. Follow these instructions at home: Medicines   Take over-the-counter and prescription medicines only as told by your health care provider.  Avoid taking medicines that can affect thinking, such as pain or sleeping medicines. Lifestyle    Make healthy lifestyle choices:  Be physically active as told by your health care provider.  Do not use any tobacco products, such as cigarettes, chewing tobacco, and e-cigarettes. If you need help quitting, ask your health care provider.  Eat a healthy diet.  Practice stress-management techniques when you get stressed.  Stay social.  Drink enough fluid to keep your urine clear or pale yellow.  Make sure to get quality sleep. These tips can help you get a good night's rest:  Avoid napping during the day.  Keep your sleeping area dark and cool.  Avoid exercising during the few hours before you go to bed.  Avoid caffeine products in the evening. General instructions   Work with your health care provider to determine what you need help with and what your safety needs are.  If you were given a bracelet that tracks your location, make sure to wear it.  Keep all follow-up visits as told by your health care provider. This is important.  If you have questions or would like additional support, you may contact The Alzheimer's Association:  24-hour  helpline: (505)718-9714  Website: [employer]nz Contact a health care provider if:  You have nausea, vomiting, or trouble with eating.  You have dizziness, or weakness.  You have new or worsening trouble with sleeping.  You or your family members become concerned for your safety. Get help right away if:  You develop chest pain or difficulty with breathing.  You pass out. This information is not intended to replace advice given to you by your health care provider. Make sure you discuss any questions you have with your health care provider. Document Released: 10/22/2003 Document Revised: 10/11/2015 Document Reviewed: 11/07/2014 Elsevier Interactive Patient Education  2017 [place] refers to food and lifestyle choices that are based on the traditions of countries located on the [employer]. This way of eating has been shown to help prevent certain conditions and improve outcomes for people who have chronic diseases, like kidney disease and heart disease. What are tips for following this plan? Lifestyle   Cook and eat meals together with your family, when possible.  Drink enough fluid to keep your urine clear or pale yellow.  Be physically active every day. This includes:  Aerobic exercise like running or swimming.  Leisure activities like gardening, walking, or housework.  Get 7-8 hours of sleep each night.  If recommended by your health care provider, drink red wine in moderation. This means 1 glass a day for nonpregnant women and 2 glasses a day for men. A glass of wine equals 5 oz (150 mL). Reading food labels   Check the serving size of packaged foods. For foods such as rice and pasta, the serving size refers to the amount of cooked product, not dry.  Check the total fat in packaged foods. Avoid foods that have saturated fat or trans fats.  Check the ingredients list for added sugars, such as corn syrup. Shopping    At the grocery store, buy most of your food from the areas near the walls of the store. This includes:  Fresh fruits and vegetables (produce).  Grains, beans, nuts, and seeds. Some of these may be available in unpackaged forms or large amounts (in bulk).  Fresh seafood.  Poultry and eggs.  Low-fat dairy products.  Buy whole ingredients instead of prepackaged foods.  Buy fresh fruits and vegetables in-season from local farmers markets.  Buy frozen fruits and vegetables in resealable bags.  If you do not have access to quality fresh seafood, buy precooked frozen shrimp or canned fish, such as tuna, salmon, or sardines.  Buy small amounts of raw or cooked vegetables, salads, or olives from the deli or salad bar at your store.  Stock your pantry so you always have certain foods on hand, such as olive oil, canned tuna, canned tomatoes, rice, pasta, and beans. Cooking   Cook foods with extra-virgin olive oil instead of using butter or other vegetable oils.  Have meat as a side dish, and have vegetables or grains as your main dish. This means having meat in small portions or adding small amounts of meat to foods like pasta or stew.  Use beans or vegetables instead of meat in common dishes like chili or lasagna.  Experiment with different cooking methods. Try roasting or broiling vegetables instead of steaming or sauteing them.  Add frozen vegetables to soups, stews, pasta, or rice.  Add nuts or seeds for added healthy fat at each meal. You can add these to yogurt, salads, or vegetable dishes.  Marinate fish or vegetables using olive oil, lemon juice, garlic, and fresh herbs. Meal planning   Plan to eat 1 vegetarian meal one day each week. Try to work up to 2 vegetarian meals, if possible.  Eat seafood 2 or more times a week.  Have healthy snacks readily available, such as:  Vegetable sticks with hummus.  Greek yogurt.  Fruit and nut trail mix.  Eat balanced meals  throughout the week. This includes:  Fruit: 2-3 servings a day  Vegetables: 4-5 servings a day  Low-fat dairy: 2 servings a day  Fish, poultry, or lean meat: 1 serving a day  Beans and legumes: 2 or more servings a week  Nuts and seeds: 1-2 servings a day  Whole grains: 6-8 servings a day  Extra-virgin olive oil: 3-4 servings a day  Limit red meat and sweets to only a few servings a month What are my food choices?  Mediterranean diet  Recommended  Grains: Whole-grain pasta. Brown rice. Bulgar wheat. Polenta. Couscous. Whole-wheat bread. [place].  Vegetables: Artichokes. Beets. Broccoli. Cabbage. Carrots. Eggplant. Green beans. Chard. Kale. Spinach. Onions. Leeks. Peas. Squash. Tomatoes. Peppers. Radishes.  Fruits: Apples. Apricots. Avocado. Berries. Bananas. Cherries. Dates. Figs. Grapes. Lemons. Melon. Oranges. Peaches. Plums. Pomegranate.  Meats and other protein foods: Beans. Almonds. Sunflower seeds. Pine nuts. Peanuts. [place].  Salmon. Scallops. Shrimp. Walnut. Tilapia. Clams. Oysters. Eggs.  Dairy: Low-fat milk. Cheese. Greek yogurt.  Beverages: Water. Red wine. Herbal tea.  Fats and oils: Extra virgin olive oil. Avocado oil. Grape seed oil.  Sweets and desserts: [place] yogurt with honey. Baked apples. Poached pears. Trail mix.  Seasoning and other foods: Basil. Cilantro. Coriander. Cumin. Mint. Parsley. Sage. Rosemary. Tarragon. Garlic. Oregano. Thyme. Pepper. Balsalmic vinegar. Tahini. Hummus. Tomato sauce. Olives. Mushrooms.  Limit these  Grains: Prepackaged pasta or rice dishes. Prepackaged cereal with added sugar.  Vegetables: Deep fried potatoes (french fries).  Fruits: Fruit canned in syrup.  Meats and other protein foods: Beef. Pork. Lamb. Poultry with skin. Hot dogs. Berniece Salines.  Dairy: Ice cream. Sour cream. Whole milk.  Beverages: Juice. Sugar-sweetened soft drinks. Beer. Liquor and spirits.  Fats and oils: Butter. Canola oil. Vegetable oil. Beef  fat (tallow). Lard.  Sweets and desserts: Cookies. Cakes. Pies. Candy.  Seasoning and other foods: Mayonnaise. Premade sauces and marinades.  The items listed may not be a complete list. Talk with your dietitian about what dietary choices are right for you. Summary  The Mediterranean diet includes both food and lifestyle choices.  Eat a variety of fresh fruits and vegetables, beans, nuts, seeds, and whole grains.  Limit the amount of red meat and sweets that you eat.  Talk with your health care provider about whether it is safe for you to drink red wine in moderation. This means 1 glass a day for nonpregnant women and 2 glasses a day for men. A glass of wine equals 5 oz (150 mL). This information is not intended to replace advice given to you by your health care provider. Make sure you discuss any questions you have with your health care provider. Document Released: 10/03/2015 Document Revised: 11/05/2015 Document Reviewed: 10/03/2015 Elsevier Interactive Patient Education  2017 [employer].

## 2016-05-21 NOTE — Progress Notes (Signed)
NEUROLOGY FOLLOW UP OFFICE NOTE  Ricky Boyd [id number]  HISTORY OF PRESENT ILLNESS: Ricky Boyd is a 76 year old left-handed male with elevated PSA who follows up for amnestic [facility].  He is accompanied by his son who provides some history.   UPDATE: He takes Namzaric.  He is doing well.  He frequently forgets to take medication, so his son sets up his medication in the morning on the counter for him and set up an alarm clock to remind him.  One time, he locked himself out of the house and didn't remember that he had a spare key in the truck.   He pays his bills but sometimes his mail piles up.  He sometimes forgets where he went to eat breakfast that morning.   HISTORY: He used to work as a Pharmacist, hospital for various subjects, including English, Social Studies and Dietitian.  He has a master's degree.     He began having short-term memory problems around 2014.  There has been a little gradual progression.  At first he noticed difficulty remembering names of friends from [place].  He will sometimes not remember the day of the week.  He frequently forgets conversations or recent activities from the previous few days.  He reports that he forgets to take his medication, although he only takes one medication daily.  Marland Kitchen  MRI of brain from 11/07/14 was personally reviewed and revealed only age-related volume loss and minimal small vessel ischemic changes.     He is a former smoker.  He has no history of stroke or head injury.   He says there is no family history but notes report that his mother had Alzheimer's dementia.  PAST MEDICAL HISTORY: Past Medical History:  Diagnosis Date  . Arthritis   . Hearing aid worn   . Memory deficit   . Prostate cancer [facility])     MEDICATIONS: Current Outpatient Prescriptions on File Prior to Visit  Medication Sig Dispense Refill  . Ascorbic Acid (VITAMIN C) 500 MG CAPS Take 500 mg by mouth daily.     . fish oil-omega-3 fatty acids 1000 MG capsule Take  1 g by mouth 2 (two) times daily.    Marland Kitchen glucosamine-chondroitin 500-400 MG tablet Take 1 tablet by mouth 2 (two) times daily.    . Memantine HCl-Donepezil HCl (NAMZARIC) 28-10 MG CP24 Take 28 mg by mouth daily. 30 capsule 5  . Multiple Vitamins-Minerals (MULTIVITAMIN WITH MINERALS) tablet Take 1 tablet by mouth daily.    . saw palmetto 160 MG capsule Take 160 mg by mouth 2 (two) times daily.    . sildenafil (REVATIO) 20 MG tablet Take 1 tablet (20 mg total) by mouth as needed. 10 tablet 0   No current facility-administered medications on file prior to visit.     ALLERGIES: Allergies  Allergen Reactions  . Indocin [Indomethacin] Other (See Comments)    Confusion, short term loss of memory  . Amoxicillin-Pot Clavulanate Other (See Comments)    Bad Dreams  . Erythromycin Ethylsuccinate Other (See Comments)     irritates eyes  . Ibuprofen Other (See Comments)    Confusion on 800mg   Tolerates smaller doses    FAMILY HISTORY: Family History  Problem Relation Age of Onset  . Diverticulitis Father   . Depression Brother   . Cancer Paternal Uncle   . Colon cancer Neg Hx     SOCIAL HISTORY: Social History   Social History  . Marital status: Single    Spouse  name: N/A  . Number of children: 3  . Years of education: Master's   Occupational History  . Not on file.   Social History Main Topics  . Smoking status: Former Smoker    Packs/day: 0.25    Years: 10.00    Types: Cigarettes    Quit date: 02/23/1958  . Smokeless tobacco: Former Systems developer     Comment: quit 1963  . Alcohol use 3.6 oz/week    6 Cans of beer per week     Comment: occasionally  . Drug use: No  . Sexual activity: Yes   Other Topics Concern  . Not on file   Social History Narrative   Desires CPR.      Would desire life support and and possibly feeding tube.      Patient currently lives alone- 2nd wife killed herself. Lives in one story house.    Patient is left handed.   Patient has a Scientist, water quality.      REVIEW OF SYSTEMS: Constitutional: No fevers, chills, or sweats, no generalized fatigue, change in appetite Eyes: No visual changes, double vision, eye pain Ear, nose and throat: No hearing loss, ear pain, nasal congestion, sore throat Cardiovascular: No chest pain, palpitations Respiratory:  No shortness of breath at rest or with exertion, wheezes GastrointestinaI: No nausea, vomiting, diarrhea, abdominal pain, fecal incontinence Genitourinary:  No dysuria, urinary retention or frequency Musculoskeletal:  No neck pain, back pain Integumentary: No rash, pruritus, skin lesions Neurological: as above Psychiatric: No depression, insomnia, anxiety Endocrine: No palpitations, fatigue, diaphoresis, mood swings, change in appetite, change in weight, increased thirst Hematologic/Lymphatic:  No purpura, petechiae. Allergic/Immunologic: no itchy/runny eyes, nasal congestion, recent allergic reactions, rashes  PHYSICAL EXAM: Vitals:   05/21/16 1139  BP: 138/74  Pulse: 69   General: No acute distress.  Patient appears well-groomed.  normal body habitus. Head:  Normocephalic/atraumatic Eyes:  Fundi examined but not visualized Neck: supple, no paraspinal tenderness, full range of motion Heart:  Regular rate and rhythm Lungs:  Clear to auscultation bilaterally Back: No paraspinal tenderness Neurological Exam: alert and oriented to person, place, and time (except year). Attention span and concentration intact, delayed recall poor, remote memory intact, fund of knowledge intact.  Speech fluent and not dysarthric, language intact.   MMSE - Mini Mental State Exam 05/21/2016 08/06/2015  Orientation to time 3 5  Orientation to Place 5 5  Registration 3 3  Attention/ Calculation 2 0  Recall 0 1  Recall-comments - pt was unable to recall 2 of 3 words without cues  Language- name 2 objects 2 0  Language- repeat 1 1  Language- follow 3 step command 2 3  Language- read & follow direction 1 0   Write a sentence 1 0  Copy design 0 0  Total score 20 18   CN II-XII intact. Bulk and tone normal, muscle strength 5/5 throughout.  Sensation to light touch  intact.  Deep tendon reflexes 2+ throughout.  Finger to nose testing intact.  Gait normal, Romberg negative.  IMPRESSION: Mild Alzheimer's dementia  PLAN: 1.  Namzaric 2.  Driving restrictions 3.  Encouraged to continue socialization 4.  Always use hearing aids. 5.  Follow up in 9 months  21 minutes spent face to face with patient, over 50% spent discussing recommendations.  Metta Clines, DO  CC:  Arnette Norris, MD

## 2016-05-28 ENCOUNTER — Ambulatory Visit: Payer: Medicare Other | Admitting: Internal Medicine

## 2016-06-15 ENCOUNTER — Encounter: Payer: Self-pay | Admitting: Medical Oncology

## 2016-06-15 ENCOUNTER — Telehealth: Payer: Self-pay | Admitting: Medical Oncology

## 2016-06-15 NOTE — Progress Notes (Signed)
Spoke with son to verify androgen deprivation injection and his appointments for gold markers 07/01/2016  at 2:00 pm- Dr. Diona Fanti and CT simulation 07/10/2016 at 9:00 here at [facility]. He voiced understanding and I asked him to call me with any questions or concerns.

## 2016-07-08 NOTE — Progress Notes (Signed)
m Radiation Oncology         [phone]) [phone] ________________________________  Name: Ricky Boyd MRN: [id number]  Date: 07/10/2016  DOB: 07/12/1940  SIMULATION AND TREATMENT PLANNING NOTE    ICD-9-CM ICD-10-CM   1. Malignant neoplasm of prostate ([place]) 185 C61     DIAGNOSIS:  76 y.o. 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  Identity was confirmed.  All relevant records and images related to the planned course of therapy were reviewed.  The patient freely provided informed written consent to proceed with treatment after reviewing the details related to the planned course of therapy. The consent form was witnessed and verified by the simulation staff.  Then, the patient was set-up in a stable reproducible supine position for radiation therapy.  A vacuum lock pillow device was custom fabricated to position his legs in a reproducible immobilized position.  Then, I performed a urethrogram under sterile conditions to identify the prostatic apex.  CT images were obtained.  Surface markings were placed.  The CT images were loaded into the planning software.  Then the prostate target and avoidance structures including the rectum, bladder, bowel and hips were contoured.  Treatment planning then occurred.  The radiation prescription was entered and confirmed.  A total of one complex treatment devices were fabricated. I have requested : Intensity Modulated Radiotherapy (IMRT) is medically necessary for this case for the following reason:  Rectal sparing.Marland Kitchen  PLAN:  The patient will receive 75 Gy in 40 fractions with 45 Gy to lymph nodes and then boost prostate.  ________________________________  Sheral Apley. Tammi Klippel, M.D.

## 2016-07-10 ENCOUNTER — Encounter: Payer: Self-pay | Admitting: Medical Oncology

## 2016-07-10 ENCOUNTER — Ambulatory Visit
Admission: RE | Admit: 2016-07-10 | Discharge: 2016-07-10 | Disposition: A | Discharge status: Home / Self Care | Payer: Medicare Other | Source: Ambulatory Visit | Attending: Radiation Oncology | Admitting: Radiation Oncology

## 2016-07-10 DIAGNOSIS — C61 Malignant neoplasm of prostate: Secondary | ICD-10-CM

## 2016-07-10 DIAGNOSIS — Z510 Encounter for antineoplastic radiation therapy: Secondary | ICD-10-CM | POA: Diagnosis not present

## 2016-07-14 ENCOUNTER — Ambulatory Visit: Payer: Medicare Other | Admitting: Neurology

## 2016-07-21 DIAGNOSIS — Z510 Encounter for antineoplastic radiation therapy: Secondary | ICD-10-CM | POA: Diagnosis not present

## 2016-07-22 ENCOUNTER — Ambulatory Visit
Admission: RE | Admit: 2016-07-22 | Discharge: 2016-07-22 | Disposition: A | Discharge status: Home / Self Care | Payer: Medicare Other | Source: Ambulatory Visit | Attending: Radiation Oncology | Admitting: Radiation Oncology

## 2016-07-22 DIAGNOSIS — Z8379 Family history of other diseases of the digestive system: Secondary | ICD-10-CM | POA: Diagnosis not present

## 2016-07-22 DIAGNOSIS — Z888 Allergy status to other drugs, medicaments and biological substances status: Secondary | ICD-10-CM | POA: Diagnosis not present

## 2016-07-22 DIAGNOSIS — Z818 Family history of other mental and behavioral disorders: Secondary | ICD-10-CM | POA: Insufficient documentation

## 2016-07-22 DIAGNOSIS — Z87891 Personal history of nicotine dependence: Secondary | ICD-10-CM | POA: Diagnosis not present

## 2016-07-22 DIAGNOSIS — Z510 Encounter for antineoplastic radiation therapy: Secondary | ICD-10-CM | POA: Insufficient documentation

## 2016-07-22 DIAGNOSIS — F0390 Unspecified dementia without behavioral disturbance: Secondary | ICD-10-CM | POA: Diagnosis not present

## 2016-07-22 DIAGNOSIS — Z880 Allergy status to penicillin: Secondary | ICD-10-CM | POA: Insufficient documentation

## 2016-07-22 DIAGNOSIS — Z809 Family history of malignant neoplasm, unspecified: Secondary | ICD-10-CM | POA: Diagnosis not present

## 2016-07-22 DIAGNOSIS — M1990 Unspecified osteoarthritis, unspecified site: Secondary | ICD-10-CM | POA: Diagnosis not present

## 2016-07-22 DIAGNOSIS — Z79899 Other long term (current) drug therapy: Secondary | ICD-10-CM | POA: Insufficient documentation

## 2016-07-22 DIAGNOSIS — Z885 Allergy status to narcotic agent status: Secondary | ICD-10-CM | POA: Diagnosis not present

## 2016-07-22 DIAGNOSIS — C61 Malignant neoplasm of prostate: Secondary | ICD-10-CM | POA: Diagnosis not present

## 2016-07-22 DIAGNOSIS — Z9889 Other specified postprocedural states: Secondary | ICD-10-CM | POA: Diagnosis not present

## 2016-07-23 ENCOUNTER — Encounter: Payer: Self-pay | Admitting: Medical Oncology

## 2016-07-23 ENCOUNTER — Ambulatory Visit
Admission: RE | Admit: 2016-07-23 | Discharge: 2016-07-23 | Disposition: A | Discharge status: Home / Self Care | Payer: Medicare Other | Source: Ambulatory Visit | Attending: Radiation Oncology | Admitting: Radiation Oncology

## 2016-07-23 DIAGNOSIS — Z510 Encounter for antineoplastic radiation therapy: Secondary | ICD-10-CM | POA: Diagnosis not present

## 2016-07-24 ENCOUNTER — Ambulatory Visit
Admission: RE | Admit: 2016-07-24 | Discharge: 2016-07-24 | Disposition: A | Discharge status: Home / Self Care | Payer: Medicare Other | Source: Ambulatory Visit | Attending: Radiation Oncology | Admitting: Radiation Oncology

## 2016-07-24 DIAGNOSIS — Z510 Encounter for antineoplastic radiation therapy: Secondary | ICD-10-CM | POA: Diagnosis not present

## 2016-07-27 ENCOUNTER — Ambulatory Visit
Admission: RE | Admit: 2016-07-27 | Discharge: 2016-07-27 | Disposition: A | Discharge status: Home / Self Care | Payer: Medicare Other | Source: Ambulatory Visit | Attending: Radiation Oncology | Admitting: Radiation Oncology

## 2016-07-27 DIAGNOSIS — Z510 Encounter for antineoplastic radiation therapy: Secondary | ICD-10-CM | POA: Diagnosis not present

## 2016-07-28 ENCOUNTER — Ambulatory Visit
Admission: RE | Admit: 2016-07-28 | Discharge: 2016-07-28 | Disposition: A | Discharge status: Home / Self Care | Payer: Medicare Other | Source: Ambulatory Visit | Attending: Radiation Oncology | Admitting: Radiation Oncology

## 2016-07-28 DIAGNOSIS — Z510 Encounter for antineoplastic radiation therapy: Secondary | ICD-10-CM | POA: Diagnosis not present

## 2016-07-29 ENCOUNTER — Ambulatory Visit
Admission: RE | Admit: 2016-07-29 | Discharge: 2016-07-29 | Disposition: A | Discharge status: Home / Self Care | Payer: Medicare Other | Source: Ambulatory Visit | Attending: Radiation Oncology | Admitting: Radiation Oncology

## 2016-07-29 DIAGNOSIS — Z510 Encounter for antineoplastic radiation therapy: Secondary | ICD-10-CM | POA: Diagnosis not present

## 2016-07-30 ENCOUNTER — Ambulatory Visit
Admission: RE | Admit: 2016-07-30 | Discharge: 2016-07-30 | Disposition: A | Discharge status: Home / Self Care | Payer: Medicare Other | Source: Ambulatory Visit | Attending: Radiation Oncology | Admitting: Radiation Oncology

## 2016-07-30 DIAGNOSIS — Z510 Encounter for antineoplastic radiation therapy: Secondary | ICD-10-CM | POA: Diagnosis not present

## 2016-07-31 ENCOUNTER — Ambulatory Visit
Admission: RE | Admit: 2016-07-31 | Discharge: 2016-07-31 | Disposition: A | Discharge status: Home / Self Care | Payer: Medicare Other | Source: Ambulatory Visit | Attending: Radiation Oncology | Admitting: Radiation Oncology

## 2016-07-31 DIAGNOSIS — Z510 Encounter for antineoplastic radiation therapy: Secondary | ICD-10-CM | POA: Diagnosis not present

## 2016-08-03 ENCOUNTER — Ambulatory Visit
Admission: RE | Admit: 2016-08-03 | Discharge: 2016-08-03 | Disposition: A | Discharge status: Home / Self Care | Payer: Medicare Other | Source: Ambulatory Visit | Attending: Radiation Oncology | Admitting: Radiation Oncology

## 2016-08-03 DIAGNOSIS — Z510 Encounter for antineoplastic radiation therapy: Secondary | ICD-10-CM | POA: Diagnosis not present

## 2016-08-04 ENCOUNTER — Ambulatory Visit
Admission: RE | Admit: 2016-08-04 | Discharge: 2016-08-04 | Disposition: A | Discharge status: Home / Self Care | Payer: Medicare Other | Source: Ambulatory Visit | Attending: Radiation Oncology | Admitting: Radiation Oncology

## 2016-08-04 DIAGNOSIS — Z510 Encounter for antineoplastic radiation therapy: Secondary | ICD-10-CM | POA: Diagnosis not present

## 2016-08-05 ENCOUNTER — Ambulatory Visit
Admission: RE | Admit: 2016-08-05 | Discharge: 2016-08-05 | Disposition: A | Discharge status: Home / Self Care | Payer: Medicare Other | Source: Ambulatory Visit | Attending: Radiation Oncology | Admitting: Radiation Oncology

## 2016-08-05 DIAGNOSIS — Z510 Encounter for antineoplastic radiation therapy: Secondary | ICD-10-CM | POA: Diagnosis not present

## 2016-08-06 ENCOUNTER — Ambulatory Visit
Admission: RE | Admit: 2016-08-06 | Discharge: 2016-08-06 | Disposition: A | Discharge status: Home / Self Care | Payer: Medicare Other | Source: Ambulatory Visit | Attending: Radiation Oncology | Admitting: Radiation Oncology

## 2016-08-06 DIAGNOSIS — Z510 Encounter for antineoplastic radiation therapy: Secondary | ICD-10-CM | POA: Diagnosis not present

## 2016-08-07 ENCOUNTER — Ambulatory Visit
Admission: RE | Admit: 2016-08-07 | Discharge: 2016-08-07 | Disposition: A | Discharge status: Home / Self Care | Payer: Medicare Other | Source: Ambulatory Visit | Attending: Radiation Oncology | Admitting: Radiation Oncology

## 2016-08-07 ENCOUNTER — Encounter: Payer: Self-pay | Admitting: Medical Oncology

## 2016-08-07 ENCOUNTER — Other Ambulatory Visit: Payer: Self-pay | Admitting: Urology

## 2016-08-07 DIAGNOSIS — C61 Malignant neoplasm of prostate: Secondary | ICD-10-CM

## 2016-08-07 DIAGNOSIS — Z510 Encounter for antineoplastic radiation therapy: Secondary | ICD-10-CM | POA: Diagnosis not present

## 2016-08-07 MED ORDER — SILODOSIN 4 MG PO CAPS: 4.0000 mg | ORAL_CAPSULE | Freq: Every day | ORAL | 4 refills | Status: DC

## 2016-08-07 MED ORDER — SILODOSIN 8 MG PO CAPS: 8.0000 mg | ORAL_CAPSULE | Freq: Every day | ORAL | 5 refills | Status: DC

## 2016-08-07 NOTE — Progress Notes (Signed)
At routine scheduled PUT visit, patient reported increased hesitancy, straining to void, incomplete emptying and increased frequency of urination ongoing for the past week.  He denies dysuria, flank pain, suprapubic discomfort, fever or chills.  We will try a course of Rapaflo for relief of obstructive sxs.  He will take 8mg  po qd at supper.  I have sent a Rx for Rapaflo to his pharmacy.  He is states his understanding and agreement with this plan.  He knows to contact us with any questions or concerns in the interim.

## 2016-08-10 ENCOUNTER — Ambulatory Visit
Admission: RE | Admit: 2016-08-10 | Discharge: 2016-08-10 | Disposition: A | Discharge status: Home / Self Care | Payer: Medicare Other | Source: Ambulatory Visit | Attending: Radiation Oncology | Admitting: Radiation Oncology

## 2016-08-10 DIAGNOSIS — Z510 Encounter for antineoplastic radiation therapy: Secondary | ICD-10-CM | POA: Diagnosis not present

## 2016-08-11 ENCOUNTER — Ambulatory Visit
Admission: RE | Admit: 2016-08-11 | Discharge: 2016-08-11 | Disposition: A | Discharge status: Home / Self Care | Payer: Medicare Other | Source: Ambulatory Visit | Attending: Radiation Oncology | Admitting: Radiation Oncology

## 2016-08-11 DIAGNOSIS — Z510 Encounter for antineoplastic radiation therapy: Secondary | ICD-10-CM | POA: Diagnosis not present

## 2016-08-12 ENCOUNTER — Ambulatory Visit
Admission: RE | Admit: 2016-08-12 | Discharge: 2016-08-12 | Disposition: A | Discharge status: Home / Self Care | Payer: Medicare Other | Source: Ambulatory Visit | Attending: Radiation Oncology | Admitting: Radiation Oncology

## 2016-08-12 DIAGNOSIS — Z510 Encounter for antineoplastic radiation therapy: Secondary | ICD-10-CM | POA: Diagnosis not present

## 2016-08-13 ENCOUNTER — Ambulatory Visit
Admission: RE | Admit: 2016-08-13 | Discharge: 2016-08-13 | Disposition: A | Discharge status: Home / Self Care | Payer: Medicare Other | Source: Ambulatory Visit | Attending: Radiation Oncology | Admitting: Radiation Oncology

## 2016-08-13 DIAGNOSIS — Z510 Encounter for antineoplastic radiation therapy: Secondary | ICD-10-CM | POA: Diagnosis not present

## 2016-08-14 ENCOUNTER — Ambulatory Visit
Admission: RE | Admit: 2016-08-14 | Discharge: 2016-08-14 | Disposition: A | Discharge status: Home / Self Care | Payer: Medicare Other | Source: Ambulatory Visit | Attending: Radiation Oncology | Admitting: Radiation Oncology

## 2016-08-14 DIAGNOSIS — Z510 Encounter for antineoplastic radiation therapy: Secondary | ICD-10-CM | POA: Diagnosis not present

## 2016-08-17 ENCOUNTER — Ambulatory Visit
Admission: RE | Admit: 2016-08-17 | Discharge: 2016-08-17 | Disposition: A | Discharge status: Home / Self Care | Payer: Medicare Other | Source: Ambulatory Visit | Attending: Radiation Oncology | Admitting: Radiation Oncology

## 2016-08-17 DIAGNOSIS — Z510 Encounter for antineoplastic radiation therapy: Secondary | ICD-10-CM | POA: Diagnosis not present

## 2016-08-18 ENCOUNTER — Ambulatory Visit
Admission: RE | Admit: 2016-08-18 | Discharge: 2016-08-18 | Disposition: A | Discharge status: Home / Self Care | Payer: Medicare Other | Source: Ambulatory Visit | Attending: Radiation Oncology | Admitting: Radiation Oncology

## 2016-08-18 DIAGNOSIS — Z510 Encounter for antineoplastic radiation therapy: Secondary | ICD-10-CM | POA: Diagnosis not present

## 2016-08-19 ENCOUNTER — Ambulatory Visit
Admission: RE | Admit: 2016-08-19 | Discharge: 2016-08-19 | Disposition: A | Discharge status: Home / Self Care | Payer: Medicare Other | Source: Ambulatory Visit | Attending: Radiation Oncology | Admitting: Radiation Oncology

## 2016-08-19 DIAGNOSIS — Z510 Encounter for antineoplastic radiation therapy: Secondary | ICD-10-CM | POA: Diagnosis not present

## 2016-08-20 ENCOUNTER — Telehealth: Payer: Self-pay | Admitting: Radiation Oncology

## 2016-08-20 ENCOUNTER — Ambulatory Visit
Admission: RE | Admit: 2016-08-20 | Discharge: 2016-08-20 | Disposition: A | Discharge status: Home / Self Care | Payer: Medicare Other | Source: Ambulatory Visit | Attending: Radiation Oncology | Admitting: Radiation Oncology

## 2016-08-20 DIAGNOSIS — Z510 Encounter for antineoplastic radiation therapy: Secondary | ICD-10-CM | POA: Diagnosis not present

## 2016-08-20 NOTE — Telephone Encounter (Signed)
Received voicemail message from Sudie Grumbling of [facility] [phone]. She is requesting a return call for clearance. She explained in the voicemail this patient needs to have a tooth extracted but, questions if it is safe to do while he is receiving radiation therapy. No answer at either number left on voicemail but, was able to leave message on [phone] phone. Patient is receiving radiation to his prostate thus, he is safe to have his tooth extracted.

## 2016-08-21 ENCOUNTER — Telehealth: Payer: Self-pay | Admitting: Radiation Oncology

## 2016-08-21 ENCOUNTER — Ambulatory Visit
Admission: RE | Admit: 2016-08-21 | Discharge: 2016-08-21 | Disposition: A | Discharge status: Home / Self Care | Payer: Medicare Other | Source: Ambulatory Visit | Attending: Radiation Oncology | Admitting: Radiation Oncology

## 2016-08-21 DIAGNOSIS — Z510 Encounter for antineoplastic radiation therapy: Secondary | ICD-10-CM | POA: Diagnosis not present

## 2016-08-21 NOTE — Telephone Encounter (Signed)
Received another voicemail message from Dr. Sudie Grumbling (patient's dentist) requesting return call. Phoned her back at the number provided. No answer. Left second message that this patient is safe to have a tooth abstracted since we are treating his prostate. Left contact information again should she have more questions.

## 2016-08-24 ENCOUNTER — Ambulatory Visit
Admission: RE | Admit: 2016-08-24 | Discharge: 2016-08-24 | Disposition: A | Discharge status: Home / Self Care | Payer: Medicare Other | Source: Ambulatory Visit | Attending: Radiation Oncology | Admitting: Radiation Oncology

## 2016-08-24 DIAGNOSIS — Z510 Encounter for antineoplastic radiation therapy: Secondary | ICD-10-CM | POA: Diagnosis not present

## 2016-08-25 ENCOUNTER — Ambulatory Visit
Admission: RE | Admit: 2016-08-25 | Discharge: 2016-08-25 | Disposition: A | Discharge status: Home / Self Care | Payer: Medicare Other | Source: Ambulatory Visit | Attending: Radiation Oncology | Admitting: Radiation Oncology

## 2016-08-25 DIAGNOSIS — Z510 Encounter for antineoplastic radiation therapy: Secondary | ICD-10-CM | POA: Diagnosis not present

## 2016-08-27 ENCOUNTER — Ambulatory Visit
Admission: RE | Admit: 2016-08-27 | Discharge: 2016-08-27 | Disposition: A | Discharge status: Home / Self Care | Payer: Medicare Other | Source: Ambulatory Visit | Attending: Radiation Oncology | Admitting: Radiation Oncology

## 2016-08-27 ENCOUNTER — Encounter: Payer: Self-pay | Admitting: Medical Oncology

## 2016-08-27 DIAGNOSIS — Z510 Encounter for antineoplastic radiation therapy: Secondary | ICD-10-CM | POA: Diagnosis not present

## 2016-08-27 NOTE — Progress Notes (Signed)
Pt's son called stating that he came with his father to radiation but he had to leave due to an appointment. He asked if I would help his father get to the valet to get his truck. I informed him I would be happy to.  Ricky Boyd states he is tolerating radiation well but states he has urinary frequency. We discussed the side effects of radiation and I told him this is normal. He has trouble with his memory but did acknowledge this was a side effect. I assisted Ricky Boyd to valet and he left for home in his truck.

## 2016-08-28 ENCOUNTER — Ambulatory Visit
Admission: RE | Admit: 2016-08-28 | Discharge: 2016-08-28 | Disposition: A | Discharge status: Home / Self Care | Payer: Medicare Other | Source: Ambulatory Visit | Attending: Radiation Oncology | Admitting: Radiation Oncology

## 2016-08-28 ENCOUNTER — Other Ambulatory Visit: Payer: Self-pay | Admitting: Radiation Oncology

## 2016-08-28 DIAGNOSIS — Z510 Encounter for antineoplastic radiation therapy: Secondary | ICD-10-CM | POA: Diagnosis not present

## 2016-08-28 DIAGNOSIS — C61 Malignant neoplasm of prostate: Secondary | ICD-10-CM

## 2016-08-28 MED ORDER — TAMSULOSIN HCL 0.4 MG PO CAPS: 0.4000 mg | ORAL_CAPSULE | Freq: Every day | ORAL | 5 refills | Status: DC

## 2016-08-31 ENCOUNTER — Ambulatory Visit
Admission: RE | Admit: 2016-08-31 | Discharge: 2016-08-31 | Disposition: A | Discharge status: Home / Self Care | Payer: Medicare Other | Source: Ambulatory Visit | Attending: Radiation Oncology | Admitting: Radiation Oncology

## 2016-08-31 ENCOUNTER — Ambulatory Visit: Payer: Medicare Other

## 2016-08-31 DIAGNOSIS — Z510 Encounter for antineoplastic radiation therapy: Secondary | ICD-10-CM | POA: Diagnosis not present

## 2016-09-01 ENCOUNTER — Ambulatory Visit
Admission: RE | Admit: 2016-09-01 | Discharge: 2016-09-01 | Disposition: A | Discharge status: Home / Self Care | Payer: Medicare Other | Source: Ambulatory Visit | Attending: Radiation Oncology | Admitting: Radiation Oncology

## 2016-09-01 DIAGNOSIS — Z510 Encounter for antineoplastic radiation therapy: Secondary | ICD-10-CM | POA: Diagnosis not present

## 2016-09-02 ENCOUNTER — Ambulatory Visit
Admission: RE | Admit: 2016-09-02 | Discharge: 2016-09-02 | Disposition: A | Discharge status: Home / Self Care | Payer: Medicare Other | Source: Ambulatory Visit | Attending: Radiation Oncology | Admitting: Radiation Oncology

## 2016-09-02 DIAGNOSIS — Z510 Encounter for antineoplastic radiation therapy: Secondary | ICD-10-CM | POA: Diagnosis not present

## 2016-09-03 ENCOUNTER — Ambulatory Visit
Admission: RE | Admit: 2016-09-03 | Discharge: 2016-09-03 | Disposition: A | Discharge status: Home / Self Care | Payer: Medicare Other | Source: Ambulatory Visit | Attending: Radiation Oncology | Admitting: Radiation Oncology

## 2016-09-03 DIAGNOSIS — Z510 Encounter for antineoplastic radiation therapy: Secondary | ICD-10-CM | POA: Diagnosis not present

## 2016-09-04 ENCOUNTER — Ambulatory Visit
Admission: RE | Admit: 2016-09-04 | Discharge: 2016-09-04 | Disposition: A | Discharge status: Home / Self Care | Payer: Medicare Other | Source: Ambulatory Visit | Attending: Radiation Oncology | Admitting: Radiation Oncology

## 2016-09-04 DIAGNOSIS — Z510 Encounter for antineoplastic radiation therapy: Secondary | ICD-10-CM | POA: Diagnosis not present

## 2016-09-07 ENCOUNTER — Ambulatory Visit
Admission: RE | Admit: 2016-09-07 | Discharge: 2016-09-07 | Disposition: A | Discharge status: Home / Self Care | Payer: Medicare Other | Source: Ambulatory Visit | Attending: Radiation Oncology | Admitting: Radiation Oncology

## 2016-09-07 DIAGNOSIS — Z510 Encounter for antineoplastic radiation therapy: Secondary | ICD-10-CM | POA: Diagnosis not present

## 2016-09-08 ENCOUNTER — Ambulatory Visit
Admission: RE | Admit: 2016-09-08 | Discharge: 2016-09-08 | Disposition: A | Discharge status: Home / Self Care | Payer: Medicare Other | Source: Ambulatory Visit | Attending: Radiation Oncology | Admitting: Radiation Oncology

## 2016-09-08 DIAGNOSIS — Z510 Encounter for antineoplastic radiation therapy: Secondary | ICD-10-CM | POA: Diagnosis not present

## 2016-09-09 ENCOUNTER — Telehealth: Payer: Self-pay | Admitting: Radiation Oncology

## 2016-09-09 ENCOUNTER — Ambulatory Visit: Admission: RE | Admit: 2016-09-09 | Payer: Medicare Other | Source: Ambulatory Visit

## 2016-09-09 NOTE — Telephone Encounter (Signed)
Patient did not show for radiation therapy today. Phoned patient's son but, no answer. Phoned patient's daughter. No answer but, able to leave a message. Will await return call from daughter.

## 2016-09-10 ENCOUNTER — Ambulatory Visit: Payer: Medicare Other

## 2016-09-11 ENCOUNTER — Encounter: Payer: Self-pay | Admitting: Radiation Oncology

## 2016-09-11 ENCOUNTER — Ambulatory Visit: Payer: Medicare Other

## 2016-09-11 NOTE — Progress Notes (Signed)
  Radiation Oncology         [phone]) [phone] ________________________________  Name: Ricky Boyd MRN: [id number]  Date: 09/11/2016  DOB: August 17, 1940  End of Treatment Note  Diagnosis:   76 y.o. 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 The prostate, seminal vesicles, and pelvic lymph nodes were initially treated to 45 Gy in 25 fractions of 1.8 Gy  2. The prostate only was boosted to 63 Gy with 9 additional fractions of 2.0 Gy   Beams/energy:  1. The prostate, seminal vesicles, and pelvic lymph nodes were initially treated using VMAT intensity modulated radiotherapy delivering 6 megavolt photons. Image guidance was performed with CB-CT studies prior to each fraction. He was immobilized with a body fix lower extremity mold.  2. the prostate only was boosted using VMAT intensity modulated radiotherapy delivering 6 megavolt photons. Image guidance was performed with CB-CT studies prior to each fraction. He was immobilized with a body fix lower extremity mold.  Narrative: The patient tolerated radiation treatment relatively well.   The patient experienced some minor urinary irritation and modest fatigue.  However, he developed the sense that the radiation was done, so he stopped coming in and discontinued radiotherapy prior to completing the planned full dose.  Plan: The patient has completed radiation treatment. He will return to radiation oncology clinic for routine followup in one month. I advised him to call or return sooner if he has any questions or concerns related to his recovery or treatment. ________________________________  Sheral Apley. Tammi Klippel, M.D.    This document serves as a record of services personally performed by Tyler Pita, MD. It was created on his behalf by Valeta Harms, a trained medical scribe. The creation of this record is based on the scribe's personal observations and the provider's statements to them. This document has been checked and approved by the attending provider.

## 2016-09-12 ENCOUNTER — Encounter (HOSPITAL_COMMUNITY): Payer: Self-pay | Admitting: Emergency Medicine

## 2016-09-12 ENCOUNTER — Emergency Department (HOSPITAL_COMMUNITY)
Admission: EM | Admit: 2016-09-12 | Discharge: 2016-09-12 | Disposition: A | Discharge status: Home / Self Care | Payer: Medicare Other | Attending: Emergency Medicine | Admitting: Emergency Medicine

## 2016-09-12 ENCOUNTER — Ambulatory Visit (HOSPITAL_COMMUNITY)
Admission: EM | Admit: 2016-09-12 | Discharge: 2016-09-12 | Disposition: A | Discharge status: Nursing Home (Includes ICF) | Payer: Medicare Other | Source: Home / Self Care

## 2016-09-12 DIAGNOSIS — C61 Malignant neoplasm of prostate: Secondary | ICD-10-CM | POA: Diagnosis not present

## 2016-09-12 DIAGNOSIS — R339 Retention of urine, unspecified: Secondary | ICD-10-CM | POA: Diagnosis not present

## 2016-09-12 DIAGNOSIS — N289 Disorder of kidney and ureter, unspecified: Secondary | ICD-10-CM | POA: Diagnosis not present

## 2016-09-12 DIAGNOSIS — Z87891 Personal history of nicotine dependence: Secondary | ICD-10-CM | POA: Diagnosis not present

## 2016-09-12 DIAGNOSIS — Z79899 Other long term (current) drug therapy: Secondary | ICD-10-CM | POA: Insufficient documentation

## 2016-09-12 LAB — URINALYSIS, ROUTINE W REFLEX MICROSCOPIC
Bilirubin Urine: NEGATIVE
GLUCOSE, UA: NEGATIVE mg/dL
Ketones, ur: NEGATIVE mg/dL
LEUKOCYTES UA: NEGATIVE
NITRITE: NEGATIVE
Protein, ur: NEGATIVE mg/dL
SPECIFIC GRAVITY, URINE: 1.013 (ref 1.005–1.030)
Squamous Epithelial / LPF: NONE SEEN
pH: 5 (ref 5.0–8.0)

## 2016-09-12 LAB — I-STAT CHEM 8, ED
BUN: 23 mg/dL — ABNORMAL HIGH (ref 6–20)
CALCIUM ION: 1.14 mmol/L — AB (ref 1.15–1.40)
Chloride: 101 mmol/L (ref 101–111)
Creatinine, Ser: 1.7 mg/dL — ABNORMAL HIGH (ref 0.61–1.24)
GLUCOSE: 100 mg/dL — AB (ref 65–99)
HCT: 32 % — ABNORMAL LOW (ref 39.0–52.0)
HEMOGLOBIN: 10.9 g/dL — AB (ref 13.0–17.0)
Potassium: 3.6 mmol/L (ref 3.5–5.1)
Sodium: 139 mmol/L (ref 135–145)
TCO2: 25 mmol/L (ref 0–100)

## 2016-09-12 LAB — COMPREHENSIVE METABOLIC PANEL
ALBUMIN: 3.7 g/dL (ref 3.5–5.0)
ALT: 27 U/L (ref 17–63)
AST: 49 U/L — AB (ref 15–41)
Alkaline Phosphatase: 44 U/L (ref 38–126)
Anion gap: 10 (ref 5–15)
BILIRUBIN TOTAL: 1.3 mg/dL — AB (ref 0.3–1.2)
BUN: 21 mg/dL — AB (ref 6–20)
CHLORIDE: 102 mmol/L (ref 101–111)
CO2: 24 mmol/L (ref 22–32)
Calcium: 8.9 mg/dL (ref 8.9–10.3)
Creatinine, Ser: 1.74 mg/dL — ABNORMAL HIGH (ref 0.61–1.24)
GFR calc Af Amer: 41 mL/min — ABNORMAL LOW (ref >60)
GFR calc non Af Amer: 36 mL/min — ABNORMAL LOW (ref >60)
GLUCOSE: 104 mg/dL — AB (ref 65–99)
POTASSIUM: 3.5 mmol/L (ref 3.5–5.1)
Sodium: 136 mmol/L (ref 135–145)
TOTAL PROTEIN: 6.8 g/dL (ref 6.5–8.1)

## 2016-09-12 LAB — CBC WITH DIFFERENTIAL/PLATELET
Basophils Absolute: 0 10*3/uL (ref 0.0–0.1)
Basophils Relative: 0 %
EOS PCT: 1 %
Eosinophils Absolute: 0 10*3/uL (ref 0.0–0.7)
HCT: 32.2 % — ABNORMAL LOW (ref 39.0–52.0)
Hemoglobin: 11.2 g/dL — ABNORMAL LOW (ref 13.0–17.0)
LYMPHS ABS: 0.6 10*3/uL — AB (ref 0.7–4.0)
LYMPHS PCT: 10 %
MCH: 31.7 pg (ref 26.0–34.0)
MCHC: 34.8 g/dL (ref 30.0–36.0)
MCV: 91.2 fL (ref 78.0–100.0)
MONO ABS: 0.5 10*3/uL (ref 0.1–1.0)
MONOS PCT: 8 %
Neutro Abs: 5.2 10*3/uL (ref 1.7–7.7)
Neutrophils Relative %: 81 %
PLATELETS: 140 10*3/uL — AB (ref 150–400)
RBC: 3.53 MIL/uL — ABNORMAL LOW (ref 4.22–5.81)
RDW: 14.7 % (ref 11.5–15.5)
WBC: 6.4 10*3/uL (ref 4.0–10.5)

## 2016-09-12 NOTE — Discharge Instructions (Signed)
Mr. Chouinard has urinary retention where he is unable to empty bladder completely. There is no signs of infection of the bladder. His kidney function is slightly elevated probably because of this retention. This will need to be rechecked by his family doctor or urologist in 1 week. He does need to follow up with urology this week  for further evaluation for the reason of his urinary retention. Return to ER if foley not draining, high fever, or any new concerning symptom.

## 2016-09-12 NOTE — ED Notes (Signed)
This nurse and [place] Bells, NT attempted to insert Foley catheter. This nurse unable to catheterize pt due to catheter kinking consistent with an enlarged prostate. Lahoma Rocker, PA aware. Requesting coudet catheter. [place], CN aware and checking for nurse that is competent in inserting coudet catheters to perform insertion.

## 2016-09-12 NOTE — ED Provider Notes (Signed)
CSN: [id number]     Arrival date & time 09/12/16  1200 History   None    No chief complaint on file.  (Consider location/radiation/quality/duration/timing/severity/associated sxs/prior Treatment) Patient with history of dementia and prostate cancer currently receiving first round of radiation. Per family patient has difficulty urinating with an increase in confusion from baseline and reports of diffuse abdominal pain. After discussion with brother and sister in law it was decided patient would best be served in ED.      Past Medical History:  Diagnosis Date  . Arthritis   . Hearing aid worn   . Memory deficit   . Prostate cancer [facility])    Past Surgical History:  Procedure Laterality Date  . COLONOSCOPY    . HEMORROIDECTOMY    . PROSTATE BIOPSY    . TONSILLECTOMY AND ADENOIDECTOMY     Family History  Problem Relation Age of Onset  . Diverticulitis Father   . Depression Brother   . Cancer Paternal Uncle   . Colon cancer Neg Hx    Social History  Substance Use Topics  . Smoking status: Former Smoker    Packs/day: 0.25    Years: 10.00    Types: Cigarettes    Quit date: 02/23/1958  . Smokeless tobacco: Former Systems developer     Comment: quit 1963  . Alcohol use 3.6 oz/week    6 Cans of beer per week     Comment: occasionally    Review of Systems  Allergies  Indocin [indomethacin]; Amoxicillin-pot clavulanate; Erythromycin ethylsuccinate; and Ibuprofen  Home Medications   Prior to Admission medications   Medication Sig Start Date End Date Taking? Authorizing Provider  Ascorbic Acid (VITAMIN C) 500 MG CAPS Take 500 mg by mouth daily.     [provider]  fish oil-omega-3 fatty acids 1000 MG capsule Take 1 g by mouth 2 (two) times daily.    [provider]  glucosamine-chondroitin 500-400 MG tablet Take 1 tablet by mouth 2 (two) times daily.    [provider]  Memantine HCl-Donepezil HCl (NAMZARIC) 28-10 MG CP24 Take 28 mg by mouth daily. 02/27/16    Lucille Passy, MD  Multiple Vitamins-Minerals (MULTIVITAMIN WITH MINERALS) tablet Take 1 tablet by mouth daily.    [provider]  saw palmetto 160 MG capsule Take 160 mg by mouth 2 (two) times daily.    [provider]  sildenafil (REVATIO) 20 MG tablet Take 1 tablet (20 mg total) by mouth as needed. 02/27/16   Lucille Passy, MD  silodosin (RAPAFLO) 8 MG CAPS capsule Take 1 capsule (8 mg total) by mouth daily after supper. 08/07/16   Bruning, Ashlyn, PA-C  tamsulosin (FLOMAX) 0.4 MG CAPS capsule Take 1 capsule (0.4 mg total) by mouth daily after supper. 08/28/16   Tyler Pita, MD   Meds Ordered and Administered this Visit  Medications - No data to display  There were no vitals taken for this visit. No data found.   Physical Exam  Urgent Care Course     Procedures (including critical care time)  Labs Review Labs Reviewed - No data to display  Imaging Review No results found.           MDM  No diagnosis found.     Jacqualine Mau, NP 09/12/16 1233

## 2016-09-12 NOTE — ED Provider Notes (Signed)
[facility] DEPT Provider Note   CSN: [id number] Arrival date & time: 09/12/16  1236     History   Chief Complaint Chief Complaint  Patient presents with  . Urinary Retention    HPI Ricky Boyd is a 76 y.o. male.  HPI Ricky Boyd is a 76 y.o. male with history of prostate cancer, currently undergoing radiation, presents to emergency department with complaint of urinary retention, abdominal pain, increased confusion. Patient has had some urinary symptoms since starting radiation for this prostate cancer. He states in the last 2 days he hasn't been able to urinate at all. He reports associated abdominal pain. No nausea or vomiting. Family reported patient is more confused than usual. They believe the patient is dehydrated because he hasn't been eating or drinking much in the last 2 days. There has not been any fever. No nausea or vomiting. He has had persistent diarrhea since starting radiation. Denies any back pain. No other complaints.  Past Medical History:  Diagnosis Date  . Arthritis   . Hearing aid worn   . Memory deficit   . Prostate cancer [facility])     Patient Active Problem List   Diagnosis Date Noted  . Malignant neoplasm of prostate ([place]) 04/23/2016  . Sinusitis, acute 03/06/2016  . Erectile dysfunction 07/23/2015  . 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 Elevated PSA, less than 10 ng/ml 06/27/2012  . Impotence 01/28/2012  . GOUT 12/26/2008  . MEMORY LOSS 12/26/2008  . 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 History of colonic polyps 10/28/2006  . 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 COLONOSCOPY    . HEMORROIDECTOMY    . PROSTATE BIOPSY    . TONSILLECTOMY AND ADENOIDECTOMY         Home Medications    Prior to Admission medications   Medication Sig Start Date End Date Taking? Authorizing Provider  Ascorbic Acid (VITAMIN C) 500 MG CAPS Take 500 mg by mouth daily.      [provider]  fish oil-omega-3 fatty acids 1000 MG capsule Take 1 g by mouth 2 (two) times daily.    [provider]  glucosamine-chondroitin 500-400 MG tablet Take 1 tablet by mouth 2 (two) times daily.    [provider]  Memantine HCl-Donepezil HCl (NAMZARIC) 28-10 MG CP24 Take 28 mg by mouth daily. 02/27/16   Lucille Passy, MD  Multiple Vitamins-Minerals (MULTIVITAMIN WITH MINERALS) tablet Take 1 tablet by mouth daily.    [provider]  saw palmetto 160 MG capsule Take 160 mg by mouth 2 (two) times daily.    [provider]  sildenafil (REVATIO) 20 MG tablet Take 1 tablet (20 mg total) by mouth as needed. 02/27/16   Lucille Passy, MD  silodosin (RAPAFLO) 8 MG CAPS capsule Take 1 capsule (8 mg total) by mouth daily after supper. 08/07/16   Bruning, Ashlyn, PA-C  tamsulosin (FLOMAX) 0.4 MG CAPS capsule Take 1 capsule (0.4 mg total) by mouth daily after supper. 08/28/16   Tyler Pita, MD    Family History Family History  Problem Relation Age of Onset  . Diverticulitis Father   . Depression Brother   . Cancer Paternal Uncle   . Colon cancer Neg Hx     Social History Social History  Substance Use Topics  . Smoking status: Former Smoker    Packs/day: 0.25    Years: 10.00    Types: Cigarettes  Quit date: 02/23/1958  . Smokeless tobacco: Former Systems developer     Comment: quit 1963  . Alcohol use 3.6 oz/week    6 Cans of beer per week     Comment: occasionally     Allergies   Indocin [indomethacin]; Amoxicillin-pot clavulanate; Erythromycin ethylsuccinate; and Ibuprofen   Review of Systems Review of Systems  Constitutional: Negative for chills and fever.  Respiratory: Negative for cough, chest tightness and shortness of breath.   Cardiovascular: Negative for chest pain, palpitations and leg swelling.  Gastrointestinal: Positive for abdominal pain and diarrhea. Negative for abdominal distention, nausea and vomiting.  Genitourinary:  Positive for difficulty urinating, dysuria and urgency.  Musculoskeletal: Negative for arthralgias, myalgias, neck pain and neck stiffness.  Skin: Negative for rash.  Allergic/Immunologic: Negative for immunocompromised state.  Neurological: Negative for dizziness, weakness, light-headedness, numbness and headaches.  Psychiatric/Behavioral: Positive for confusion.  All other systems reviewed and are negative.    Physical Exam Updated Vital Signs BP 133/77 (BP Location: Right Arm)   Pulse 69   Temp 98.5 F (36.9 C) (Oral)   Resp 20   SpO2 96%   Physical Exam  Constitutional: He appears well-developed and well-nourished. No distress.  HENT:  Head: Normocephalic and atraumatic.  Eyes: Conjunctivae are normal.  Neck: Neck supple.  Cardiovascular: Normal rate, regular rhythm and normal heart sounds.   Pulmonary/Chest: Effort normal. No respiratory distress. He has no wheezes. He has no rales.  Abdominal: Soft. Bowel sounds are normal. He exhibits no distension. There is tenderness. There is no rebound.  Bladder palpated at umbilicus, ttp.   Musculoskeletal: He exhibits no edema.  Neurological: He is alert.  Skin: Skin is warm and dry.  Nursing note and vitals reviewed.    ED Treatments / Results  Labs (all labs ordered are listed, but only abnormal results are displayed) Labs Reviewed  URINALYSIS, ROUTINE W REFLEX MICROSCOPIC - Abnormal; Notable for the following:       Result Value   Color, Urine AMBER (*)    Hgb urine dipstick MODERATE (*)    Bacteria, UA RARE (*)    All other components within normal limits  CBC WITH DIFFERENTIAL/PLATELET - Abnormal; Notable for the following:    RBC 3.53 (*)    Hemoglobin 11.2 (*)    HCT 32.2 (*)    Platelets 140 (*)    Lymphs Abs 0.6 (*)    All other components within normal limits  COMPREHENSIVE METABOLIC PANEL - Abnormal; Notable for the following:    Glucose, Bld 104 (*)    BUN 21 (*)    Creatinine, Ser 1.74 (*)    AST 49  (*)    Total Bilirubin 1.3 (*)    GFR calc non Af Amer 36 (*)    GFR calc Af Amer 41 (*)    All other components within normal limits  I-STAT CHEM 8, ED - Abnormal; Notable for the following:    BUN 23 (*)    Creatinine, Ser 1.70 (*)    Glucose, Bld 100 (*)    Calcium, Ion 1.14 (*)    Hemoglobin 10.9 (*)    HCT 32.0 (*)    All other components within normal limits  URINE CULTURE    EKG  EKG Interpretation None       Radiology No results found.  Procedures Procedures (including critical care time)  Medications Ordered in ED Medications - No data to display   Initial Impression / Assessment and Plan / ED Course  I have reviewed the triage vital signs and the nursing notes.  Pertinent labs & imaging results that were available during my care of the patient were reviewed by me and considered in my medical decision making (see chart for details).     1:29 PM Pt with >983mL of urine on bladder scanner. Foley cath order placed. Pt afebrile, non toxic appearing. Will get labs, UA.   2:48 PM Creatinine up from baseline, 1.7, BUN23. Coude catheter was placed by RN, unable to place regular cath. Pt feels much better. UA pending.   3:43 PM UA with no signs of infection. Plan to dc home with leg bag. Will PO challenge.   Vitals:   09/12/16 1439 09/12/16 1445 09/12/16 1515 09/12/16 1600  BP: 132/77 128/69 113/74 121/68  Pulse: 67 66 66 68  Resp: 16   15  Temp:      TempSrc:      SpO2: 95% 96% 97% 97%     Final Clinical Impressions(s) / ED Diagnoses   Final diagnoses:  Urinary retention  Prostate cancer [facility])  Renal insufficiency    New Prescriptions Discharge Medication List as of 09/12/2016  4:03 PM       Jeannett Senior, PA-C 09/13/16 [place], Wenda Overland, MD 09/13/16 1620

## 2016-09-12 NOTE — ED Notes (Signed)
Ricky Boyd    hondra  In    Screened  Pt  Sent  direcctly  To  [facility]

## 2016-09-12 NOTE — ED Triage Notes (Signed)
Pt brought in by family for c/o urinary issues. For past few weeks pt has not been able to urinate completely. Per family, "He just dribbles when he goes." family also concerned about dehydration due to pt not eating or drinking well since yesterday.

## 2016-09-13 LAB — URINE CULTURE: Culture: NO GROWTH

## 2016-09-14 ENCOUNTER — Ambulatory Visit: Payer: Medicare Other

## 2016-09-15 ENCOUNTER — Other Ambulatory Visit: Payer: Self-pay | Admitting: *Deleted

## 2016-09-15 ENCOUNTER — Telehealth: Payer: Self-pay | Admitting: Neurology

## 2016-09-15 ENCOUNTER — Ambulatory Visit: Payer: Medicare Other

## 2016-09-15 DIAGNOSIS — R4189 Other symptoms and signs involving cognitive functions and awareness: Secondary | ICD-10-CM

## 2016-09-15 DIAGNOSIS — R413 Other amnesia: Secondary | ICD-10-CM

## 2016-09-15 NOTE — Telephone Encounter (Signed)
PT's father called and wanted to know if Dr Tomi Likens would request some home health care for PT, or at least have home health care evaluate him

## 2016-09-15 NOTE — Telephone Encounter (Signed)
Ok to order 

## 2016-09-15 NOTE — Telephone Encounter (Signed)
Son notified that referral has been sent.

## 2016-09-15 NOTE — Telephone Encounter (Signed)
Yes, we can do that.

## 2016-09-16 ENCOUNTER — Inpatient Hospital Stay (HOSPITAL_COMMUNITY): Payer: Medicare Other

## 2016-09-16 ENCOUNTER — Emergency Department (HOSPITAL_COMMUNITY): Payer: Medicare Other

## 2016-09-16 ENCOUNTER — Ambulatory Visit: Payer: Medicare Other

## 2016-09-16 ENCOUNTER — Encounter (HOSPITAL_COMMUNITY): Payer: Self-pay

## 2016-09-16 ENCOUNTER — Inpatient Hospital Stay (HOSPITAL_COMMUNITY)
Admission: EM | Admit: 2016-09-16 | Discharge: 2016-09-22 | DRG: 698 | Disposition: A | Discharge status: Skilled Nursing Facility | Payer: Medicare Other | Attending: Internal Medicine | Admitting: Internal Medicine

## 2016-09-16 DIAGNOSIS — F0390 Unspecified dementia without behavioral disturbance: Secondary | ICD-10-CM | POA: Diagnosis not present

## 2016-09-16 DIAGNOSIS — Z881 Allergy status to other antibiotic agents status: Secondary | ICD-10-CM | POA: Diagnosis not present

## 2016-09-16 DIAGNOSIS — N200 Calculus of kidney: Secondary | ICD-10-CM | POA: Diagnosis present

## 2016-09-16 DIAGNOSIS — M1990 Unspecified osteoarthritis, unspecified site: Secondary | ICD-10-CM | POA: Diagnosis present

## 2016-09-16 DIAGNOSIS — Z886 Allergy status to analgesic agent status: Secondary | ICD-10-CM

## 2016-09-16 DIAGNOSIS — N179 Acute kidney failure, unspecified: Secondary | ICD-10-CM | POA: Diagnosis present

## 2016-09-16 DIAGNOSIS — N390 Urinary tract infection, site not specified: Secondary | ICD-10-CM | POA: Diagnosis present

## 2016-09-16 DIAGNOSIS — B961 Klebsiella pneumoniae [K. pneumoniae] as the cause of diseases classified elsewhere: Secondary | ICD-10-CM | POA: Diagnosis present

## 2016-09-16 DIAGNOSIS — R338 Other retention of urine: Secondary | ICD-10-CM | POA: Diagnosis not present

## 2016-09-16 DIAGNOSIS — K5730 Diverticulosis of large intestine without perforation or abscess without bleeding: Secondary | ICD-10-CM | POA: Diagnosis present

## 2016-09-16 DIAGNOSIS — Y846 Urinary catheterization as the cause of abnormal reaction of the patient, or of later complication, without mention of misadventure at the time of the procedure: Secondary | ICD-10-CM | POA: Diagnosis present

## 2016-09-16 DIAGNOSIS — Z87891 Personal history of nicotine dependence: Secondary | ICD-10-CM | POA: Diagnosis not present

## 2016-09-16 DIAGNOSIS — E860 Dehydration: Secondary | ICD-10-CM | POA: Insufficient documentation

## 2016-09-16 DIAGNOSIS — R509 Fever, unspecified: Secondary | ICD-10-CM | POA: Diagnosis present

## 2016-09-16 DIAGNOSIS — T83518A Infection and inflammatory reaction due to other urinary catheter, initial encounter: Secondary | ICD-10-CM | POA: Diagnosis present

## 2016-09-16 DIAGNOSIS — R410 Disorientation, unspecified: Secondary | ICD-10-CM

## 2016-09-16 DIAGNOSIS — Z79899 Other long term (current) drug therapy: Secondary | ICD-10-CM

## 2016-09-16 DIAGNOSIS — G3184 Mild cognitive impairment, so stated: Secondary | ICD-10-CM | POA: Diagnosis present

## 2016-09-16 DIAGNOSIS — M109 Gout, unspecified: Secondary | ICD-10-CM | POA: Diagnosis present

## 2016-09-16 DIAGNOSIS — G9340 Encephalopathy, unspecified: Secondary | ICD-10-CM | POA: Diagnosis not present

## 2016-09-16 DIAGNOSIS — W19XXXA Unspecified fall, initial encounter: Secondary | ICD-10-CM

## 2016-09-16 DIAGNOSIS — Z888 Allergy status to other drugs, medicaments and biological substances status: Secondary | ICD-10-CM

## 2016-09-16 DIAGNOSIS — Z66 Do not resuscitate: Secondary | ICD-10-CM | POA: Diagnosis present

## 2016-09-16 DIAGNOSIS — B9620 Unspecified Escherichia coli [E. coli] as the cause of diseases classified elsewhere: Secondary | ICD-10-CM | POA: Diagnosis present

## 2016-09-16 DIAGNOSIS — G9341 Metabolic encephalopathy: Secondary | ICD-10-CM | POA: Diagnosis present

## 2016-09-16 DIAGNOSIS — C61 Malignant neoplasm of prostate: Secondary | ICD-10-CM | POA: Diagnosis present

## 2016-09-16 DIAGNOSIS — R52 Pain, unspecified: Secondary | ICD-10-CM

## 2016-09-16 DIAGNOSIS — Z880 Allergy status to penicillin: Secondary | ICD-10-CM

## 2016-09-16 DIAGNOSIS — K627 Radiation proctitis: Secondary | ICD-10-CM | POA: Diagnosis present

## 2016-09-16 DIAGNOSIS — Z885 Allergy status to narcotic agent status: Secondary | ICD-10-CM | POA: Diagnosis not present

## 2016-09-16 HISTORY — DX: Unspecified dementia without behavioral disturbance: F03.90

## 2016-09-16 HISTORY — DX: Unspecified dementia, unspecified severity, without behavioral disturbance, psychotic disturbance, mood disturbance, and anxiety: F03.90

## 2016-09-16 LAB — CBC WITH DIFFERENTIAL/PLATELET
BASOS ABS: 0 10*3/uL (ref 0.0–0.1)
BASOS PCT: 0 %
Eosinophils Absolute: 0.1 10*3/uL (ref 0.0–0.7)
Eosinophils Relative: 2 %
HEMATOCRIT: 32.6 % — AB (ref 39.0–52.0)
Hemoglobin: 11.5 g/dL — ABNORMAL LOW (ref 13.0–17.0)
LYMPHS PCT: 9 %
Lymphs Abs: 0.4 10*3/uL — ABNORMAL LOW (ref 0.7–4.0)
MCH: 32.3 pg (ref 26.0–34.0)
MCHC: 35.3 g/dL (ref 30.0–36.0)
MCV: 91.6 fL (ref 78.0–100.0)
MONO ABS: 0.3 10*3/uL (ref 0.1–1.0)
Monocytes Relative: 7 %
NEUTROS ABS: 3.8 10*3/uL (ref 1.7–7.7)
Neutrophils Relative %: 82 %
PLATELETS: 159 10*3/uL (ref 150–400)
RBC: 3.56 MIL/uL — AB (ref 4.22–5.81)
RDW: 14.1 % (ref 11.5–15.5)
WBC: 4.6 10*3/uL (ref 4.0–10.5)

## 2016-09-16 LAB — URINALYSIS, ROUTINE W REFLEX MICROSCOPIC
Bilirubin Urine: NEGATIVE
GLUCOSE, UA: NEGATIVE mg/dL
Ketones, ur: 80 mg/dL — AB
NITRITE: POSITIVE — AB
PH: 5 (ref 5.0–8.0)
Protein, ur: 100 mg/dL — AB
SPECIFIC GRAVITY, URINE: 1.017 (ref 1.005–1.030)
Squamous Epithelial / LPF: NONE SEEN

## 2016-09-16 LAB — COMPREHENSIVE METABOLIC PANEL
ALT: 58 U/L (ref 17–63)
ANION GAP: 10 (ref 5–15)
AST: 78 U/L — ABNORMAL HIGH (ref 15–41)
Albumin: 3.1 g/dL — ABNORMAL LOW (ref 3.5–5.0)
Alkaline Phosphatase: 48 U/L (ref 38–126)
BILIRUBIN TOTAL: 0.9 mg/dL (ref 0.3–1.2)
BUN: 14 mg/dL (ref 6–20)
CHLORIDE: 100 mmol/L — AB (ref 101–111)
CO2: 29 mmol/L (ref 22–32)
Calcium: 8.7 mg/dL — ABNORMAL LOW (ref 8.9–10.3)
Creatinine, Ser: 1.09 mg/dL (ref 0.61–1.24)
Glucose, Bld: 115 mg/dL — ABNORMAL HIGH (ref 65–99)
POTASSIUM: 3.6 mmol/L (ref 3.5–5.1)
Sodium: 139 mmol/L (ref 135–145)
TOTAL PROTEIN: 7.2 g/dL (ref 6.5–8.1)

## 2016-09-16 LAB — AMMONIA: Ammonia: 13 umol/L (ref 9–35)

## 2016-09-16 LAB — URIC ACID: URIC ACID, SERUM: 5.2 mg/dL (ref 4.4–7.6)

## 2016-09-16 LAB — CBG MONITORING, ED: GLUCOSE-CAPILLARY: 104 mg/dL — AB (ref 65–99)

## 2016-09-16 LAB — TSH: TSH: 2.186 u[IU]/mL (ref 0.350–4.500)

## 2016-09-16 LAB — I-STAT CG4 LACTIC ACID, ED: LACTIC ACID, VENOUS: 0.97 mmol/L (ref 0.5–1.9)

## 2016-09-16 LAB — PROTIME-INR
INR: 1.08
Prothrombin Time: 14.1 seconds (ref 11.4–15.2)

## 2016-09-16 LAB — MAGNESIUM: Magnesium: 1.6 mg/dL — ABNORMAL LOW (ref 1.7–2.4)

## 2016-09-16 MED ORDER — TAMSULOSIN HCL 0.4 MG PO CAPS
0.4000 mg | ORAL_CAPSULE | Freq: Every day | ORAL | Status: DC
  Administered 2016-09-17 – 2016-09-21 (×5): 0.4 mg via ORAL
  Filled 2016-09-16 (×5): qty 1

## 2016-09-16 MED ORDER — METHYLPREDNISOLONE SODIUM SUCC 125 MG IJ SOLR
60.0000 mg | Freq: Once | INTRAMUSCULAR | Status: AC
  Administered 2016-09-16: 60 mg via INTRAVENOUS
  Filled 2016-09-16: qty 2

## 2016-09-16 MED ORDER — FENTANYL CITRATE (PF) 100 MCG/2ML IJ SOLN
50.0000 ug | Freq: Once | INTRAMUSCULAR | Status: AC
  Administered 2016-09-16: 50 ug via INTRAVENOUS
  Filled 2016-09-16: qty 2

## 2016-09-16 MED ORDER — DONEPEZIL HCL 10 MG PO TABS
10.0000 mg | ORAL_TABLET | Freq: Every day | ORAL | Status: DC
  Administered 2016-09-16 – 2016-09-21 (×6): 10 mg via ORAL
  Filled 2016-09-16 (×6): qty 1

## 2016-09-16 MED ORDER — ACETAMINOPHEN 650 MG RE SUPP: 650.0000 mg | Freq: Four times a day (QID) | RECTAL | Status: DC | PRN

## 2016-09-16 MED ORDER — SORBITOL 70 % SOLN: 30.0000 mL | Freq: Every day | Status: DC | PRN

## 2016-09-16 MED ORDER — SODIUM CHLORIDE 0.9 % IV BOLUS (SEPSIS)
1000.0000 mL | Freq: Once | INTRAVENOUS | Status: AC
  Administered 2016-09-16: 1000 mL via INTRAVENOUS

## 2016-09-16 MED ORDER — MEMANTINE HCL ER 28 MG PO CP24
28.0000 mg | ORAL_CAPSULE | Freq: Every day | ORAL | Status: DC
  Administered 2016-09-16 – 2016-09-21 (×6): 28 mg via ORAL
  Filled 2016-09-16 (×6): qty 1

## 2016-09-16 MED ORDER — SODIUM CHLORIDE 0.9 % IV SOLN
INTRAVENOUS | Status: DC
  Administered 2016-09-16 – 2016-09-17 (×2): via INTRAVENOUS

## 2016-09-16 MED ORDER — ONDANSETRON HCL 4 MG/2ML IJ SOLN: 4.0000 mg | Freq: Four times a day (QID) | INTRAMUSCULAR | Status: DC | PRN

## 2016-09-16 MED ORDER — SODIUM CHLORIDE 0.9% FLUSH
3.0000 mL | Freq: Two times a day (BID) | INTRAVENOUS | Status: DC
  Administered 2016-09-16 – 2016-09-20 (×4): 3 mL via INTRAVENOUS

## 2016-09-16 MED ORDER — ONDANSETRON HCL 4 MG PO TABS: 4.0000 mg | ORAL_TABLET | Freq: Four times a day (QID) | ORAL | Status: DC | PRN

## 2016-09-16 MED ORDER — FLEET ENEMA 7-19 GM/118ML RE ENEM: 1.0000 | ENEMA | Freq: Once | RECTAL | Status: DC | PRN

## 2016-09-16 MED ORDER — ALBUTEROL SULFATE (2.5 MG/3ML) 0.083% IN NEBU: 2.5000 mg | INHALATION_SOLUTION | RESPIRATORY_TRACT | Status: DC | PRN

## 2016-09-16 MED ORDER — ENOXAPARIN SODIUM 40 MG/0.4ML ~~LOC~~ SOLN
40.0000 mg | SUBCUTANEOUS | Status: DC
  Administered 2016-09-16 – 2016-09-21 (×6): 40 mg via SUBCUTANEOUS
  Filled 2016-09-16 (×6): qty 0.4

## 2016-09-16 MED ORDER — DEXTROSE 5 % IV SOLN
1.0000 g | Freq: Once | INTRAVENOUS | Status: AC
  Administered 2016-09-16: 1 g via INTRAVENOUS
  Filled 2016-09-16 (×2): qty 10

## 2016-09-16 MED ORDER — LORAZEPAM 1 MG PO TABS
1.0000 mg | ORAL_TABLET | Freq: Once | ORAL | Status: AC
  Administered 2016-09-16: 1 mg via ORAL
  Filled 2016-09-16: qty 1

## 2016-09-16 MED ORDER — DEXTROSE 5 % IV SOLN
1.0000 g | INTRAVENOUS | Status: DC
  Administered 2016-09-17 – 2016-09-19 (×3): 1 g via INTRAVENOUS
  Filled 2016-09-16 (×3): qty 10

## 2016-09-16 MED ORDER — ACETAMINOPHEN 325 MG PO TABS
650.0000 mg | ORAL_TABLET | Freq: Four times a day (QID) | ORAL | Status: DC | PRN
  Administered 2016-09-18 – 2016-09-22 (×3): 650 mg via ORAL
  Filled 2016-09-16 (×3): qty 2

## 2016-09-16 MED ORDER — HYDROCODONE-ACETAMINOPHEN 5-325 MG PO TABS: 1.0000 | ORAL_TABLET | Freq: Once | ORAL | Status: DC

## 2016-09-16 MED ORDER — PREDNISONE 20 MG PO TABS
40.0000 mg | ORAL_TABLET | Freq: Every day | ORAL | Status: AC
  Administered 2016-09-17 – 2016-09-21 (×5): 40 mg via ORAL
  Filled 2016-09-16 (×5): qty 2

## 2016-09-16 MED ORDER — MEMANTINE HCL-DONEPEZIL HCL ER 28-10 MG PO CP24: ORAL_CAPSULE | Freq: Every day | ORAL | Status: DC

## 2016-09-16 NOTE — ED Provider Notes (Addendum)
Ricky Boyd Provider Note   CSN: [id number] Arrival date & time: 09/16/16  1338     History   Chief Complaint Chief Complaint  Patient presents with  . Altered Mental Status    HPI Braden Cimo is a 76 y.o. male.  76 year old male with past medical history including mild dementia, prostate cancer, gout who presents with altered mental status. The patient presented here 3 days ago for acute urinary retention and Foley catheter was placed. His urine at that time was unremarkable. He was discharged home, daughter states that he lives alone and functions independently with very mild memory loss. She notes that over the past few days the patient has become progressively more confused, making bizarre statements and experiencing visual hallucinations. She states that this is very different from his baseline. He has been complaining of pain in his right foot consistent with gout and now has had some pain in his left foot as well. She is not aware of any fevers or vomiting. He has not had a recent cough. He has complained of generalized body pain.  LEVEL 5 CAVEAT DUE TO AMS   The history is provided by a relative.  Altered Mental Status      Past Medical History:  Diagnosis Date  . Arthritis   . Hearing aid worn   . Memory deficit   . Prostate cancer [facility])     Patient Active Problem List   Diagnosis Date Noted  . Malignant neoplasm of prostate ([place]) 04/23/2016  . Sinusitis, acute 03/06/2016  . Erectile dysfunction 07/23/2015  . 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 Elevated PSA, less than 10 ng/ml 06/27/2012  . Impotence 01/28/2012  . GOUT 12/26/2008  . MEMORY LOSS 12/26/2008  . 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 History of colonic polyps 10/28/2006  . 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 COLONOSCOPY    . HEMORROIDECTOMY    . PROSTATE BIOPSY    . TONSILLECTOMY AND ADENOIDECTOMY          Home Medications    Prior to Admission medications   Medication Sig Start Date End Date Taking? Authorizing Provider  Memantine HCl-Donepezil HCl (NAMZARIC) 28-10 MG CP24 Take 28 mg by mouth daily. 02/27/16  Yes Lucille Passy, MD    Family History Family History  Problem Relation Age of Onset  . Diverticulitis Father   . Depression Brother   . Cancer Paternal Uncle   . Colon cancer Neg Hx     Social History Social History  Substance Use Topics  . Smoking status: Former Smoker    Packs/day: 0.25    Years: 10.00    Types: Cigarettes    Quit date: 02/23/1958  . Smokeless tobacco: Former Systems developer     Comment: quit 1963  . Alcohol use 3.6 oz/week    6 Cans of beer per week     Comment: occasionally     Allergies   Hydrocodone; Indocin [indomethacin]; Amoxicillin-pot clavulanate; Erythromycin ethylsuccinate; and Ibuprofen   Review of Systems Review of Systems  Unable to perform ROS: Mental status change     Physical Exam Updated Vital Signs BP 109/60 (BP Location: Left Arm)   Pulse 74   Temp (!) 100.8 F (38.2 C) (Rectal)   Resp 13   Wt 90.3 kg (199 lb 1.2 oz)   SpO2 98%   BMI 28.98 kg/m   Physical Exam  Constitutional: He appears well-developed and  well-nourished. No distress.  HENT:  Head: Normocephalic and atraumatic.  Eyes: Pupils are equal, round, and reactive to light. Conjunctivae are normal.  Neck: Neck supple.  Cardiovascular: Normal rate, regular rhythm and normal heart sounds.   No murmur heard. Pulmonary/Chest: Effort normal and breath sounds normal.  Abdominal: Soft. Bowel sounds are normal. He exhibits no distension. There is no tenderness.  Musculoskeletal: He exhibits tenderness. He exhibits no edema.  Tenderness b/l feet, mild edema of R foot with erythema at medial ankle and 1st MTP joint  Neurological: He is alert.  Altered, visual hallucinations, non-sensical speech  Skin: Skin is warm and dry.  Nursing note and vitals  reviewed.    ED Treatments / Results  Labs (all labs ordered are listed, but only abnormal results are displayed) Labs Reviewed  COMPREHENSIVE METABOLIC PANEL - Abnormal; Notable for the following:       Result Value   Chloride 100 (*)    Glucose, Bld 115 (*)    Calcium 8.7 (*)    Albumin 3.1 (*)    AST 78 (*)    All other components within normal limits  CBC WITH DIFFERENTIAL/PLATELET - Abnormal; Notable for the following:    RBC 3.56 (*)    Hemoglobin 11.5 (*)    HCT 32.6 (*)    Lymphs Abs 0.4 (*)    All other components within normal limits  URINALYSIS, ROUTINE W REFLEX MICROSCOPIC - Abnormal; Notable for the following:    APPearance HAZY (*)    Hgb urine dipstick MODERATE (*)    Ketones, ur 80 (*)    Protein, ur 100 (*)    Nitrite POSITIVE (*)    Leukocytes, UA LARGE (*)    Bacteria, UA RARE (*)    All other components within normal limits  CBG MONITORING, ED - Abnormal; Notable for the following:    Glucose-Capillary 104 (*)    All other components within normal limits  CULTURE, BLOOD (ROUTINE X 2)  CULTURE, BLOOD (ROUTINE X 2)  URINE CULTURE  PROTIME-INR  I-STAT CG4 LACTIC ACID, ED  I-STAT CG4 LACTIC ACID, ED    EKG  EKG Interpretation  Date/Time:  Wednesday September 16 2016 13:48:32 EDT Ventricular Rate:  73 PR Interval:    QRS Duration: 89 QT Interval:  390 QTC Calculation: 430 R Axis:   41 Text Interpretation:  Atrial fibrillation No previous ECGs available Confirmed by Theotis Burrow [phone]) on 09/16/2016 2:32:47 PM       Radiology Dg Chest 2 View  Result Date: 09/16/2016 CLINICAL DATA:  Possible sepsis. EXAM: CHEST  2 VIEW COMPARISON:  Radiographs of Jul 14, 2013. FINDINGS: The heart size and mediastinal contours are within normal limits. No pneumothorax or pleural effusion is noted. Mild bibasilar atelectasis or scarring is noted. Elevated left hemidiaphragm is again noted and unchanged. The visualized skeletal structures are unremarkable.  IMPRESSION: Mild bibasilar subsegmental atelectasis or scarring. Electronically Signed   By: Marijo Conception, M.D.   On: 09/16/2016 14:54   Dg Foot Complete Right  Result Date: 09/16/2016 CLINICAL DATA:  Seven 76 year old male with right first MTP joint pain and swelling EXAM: RIGHT FOOT COMPLETE - 3+ VIEW COMPARISON:  None. FINDINGS: No evidence of acute fracture or malalignment. No lytic or blastic osseous lesions. No evidence of erosion. Very minimal degenerative change at the great toe MTP joint. Atherosclerotic vascular calcifications noted in the arteries of the foot. IMPRESSION: 1. No evidence of fracture, malalignment or inflammatory arthropathy. 2. Mild soft tissue  swelling about the first MTP joint with associated minimal degenerative osteoarthritic change. Electronically Signed   By: Jacqulynn Cadet M.D.   On: 09/16/2016 16:34    Procedures .Critical Care Performed by: Sharlett Iles Authorized by: Sharlett Iles   Critical care provider statement:    Critical care time (minutes):  30   Critical care time was exclusive of:  Separately billable procedures and treating other patients   Critical care was necessary to treat or prevent imminent or life-threatening deterioration of the following conditions:  Sepsis   Critical care was time spent personally by me on the following activities:  Development of treatment plan with patient or surrogate, evaluation of patient's response to treatment, examination of patient, obtaining history from patient or surrogate, ordering and performing treatments and interventions, ordering and review of laboratory studies, ordering and review of radiographic studies, re-evaluation of patient's condition and review of old charts    (including critical care time)  Medications Ordered in ED Medications  cefTRIAXone (ROCEPHIN) 1 g in dextrose 5 % 50 mL IVPB (not administered)  sodium chloride 0.9 % bolus 1,000 mL (not administered)  sodium  chloride 0.9 % bolus 1,000 mL (1,000 mLs Intravenous New Bag/Given 09/16/16 1553)  fentaNYL (SUBLIMAZE) injection 50 mcg (50 mcg Intravenous Given 09/16/16 1647)     Initial Impression / Assessment and Plan / ED Course  I have reviewed the triage vital signs and the nursing notes.  Pertinent labs & imaging results that were available during my care of the patient were reviewed by me and considered in my medical decision making (see chart for details).     Pt w/ recent acute urinary retention A few days ago, Foley catheter placed uneventfully, returns today for altered mental status and hallucinations. On arrival to the ED, he had stable vital signs, was afebrile. He was very confused and hallucinating on exam. He did not have any abdominal tenderness, did have bilateral foot tenderness and based on swelling of the ankle and first MTP joint on the right, it is possible that he has a gout flare. No signs of cellulitis.  Labs show UTI, normal Cr, WBC 4.6. He later developed fever and I initiated code sepsis w/ blood cultures, CTX for UTI, fluids. I have ordered a CT renal study to r/o infected stone as pt appears to have pain but does not have focal source. CXR negative acute. XR foot negative for acute injury. I obtained CT renal study to r/o infected stone. CT shows Bladder Boerema inflammation consistent with bladder infection. Patient's vital signs remained stable at time of admission. Discussed admission with [facility], Dr. Grandville Silos, and patient admitted for further care.   Final Clinical Impressions(s) / ED Diagnoses   Final diagnoses:  Urinary tract infection without hematuria, site unspecified  Delirium    New Prescriptions New Prescriptions   No medications on file     Sabriel Borromeo, Wenda Overland, MD 09/16/16 [place], Wenda Overland, MD 09/16/16 1747

## 2016-09-16 NOTE — ED Triage Notes (Signed)
Patient BIB EMS from home with history of Alzheimers and  prostate CA and is receiving radiation. Per report, the patient is experiencing urinary retention r/t radiation/CA- patient had foley catheter placed at [facility] recently for this retention. Per report, today the patient began "talking out of his head," experiencing visual hallucinations, and making "off the [place]" statements. Per son, patient is more forgetful than normal. The only pain the patient is complaining of at this time is bilateral knees- patient has history of Gout to his Right knee.

## 2016-09-16 NOTE — H&P (Signed)
History and Physical    Ricky Boyd VEH:[id number] DOB: 05-10-1940 DOA: 09/16/2016  PCP: Lucille Passy, MD  Patient coming from: Home  I have personally briefly reviewed patient's old medical records in [place]  Chief Complaint: Confusion/hallucinations  HPI: Ricky Boyd is a 76 y.o. 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 Patient had presented to the ED 3 days prior to admission and noted to have acute urinary retention Foley catheter was placed urinalysis at that time was unremarkable. Patient subsequently discharged home. Patient presenting back per son with worsening hallucinations over the past 1-2 days the patient is seen dead relatives, picking tomatoes andcorn out pf the sky. [name] who gives most of the history states patient did not have auditory hallucinations. [name] also states patient does endorse some chills, generalized weakness, bilateral foot and knee pain, chronic diarrhea felt to be secondary to radiation treatment with a little bit of blood noted in his stool which was maroon colored. Per son patient has not had any fevers, no nausea, no emesis, no chest pain, no shortness of breath, no abdominal pain, no constipation, no hematemesis, no melena, no hematochezia, no cough, no facial asymmetry, no slurred speech, no asymmetric weakness or numbness.   ED Course: Patient seen in the ED urinalysis with moderate hemoglobin, 80 ketones, large leukocytes, positive nitrites, too numerous to count WBCs, WBC clumps. CT stone protocol which was done showed thick walled appearing urinary bladder with surrounding stranding suggesting possibility of cystitis. Negative for hydronephrosis or ureteral stone.  Punctuate nonobstructing stone in the left kidney. Sigmoid colon diverticular disease without acute inflammation. Bibasilar subpleural fibrosis. Comprehensive metabolic profile with a chloride of 100, glucose of 1:15, calcium of 8.7, AST of 78, ALT of 58. CBC had a hemoglobin of 11.5 otherwise within normal limits. Lactic acid level is 0.97. INR 1.08. Patient noted to have a temperature 100.8. Today. The right foot negative for fracture, malalignment or inflammatory arthropathy. Mild soft tissue swelling about the first MTP joint with associated minimal degenerative osteoarthritic change.  Review of Systems: As per HPI otherwise 10 point review of systems negative.  Past Medical History:  Diagnosis Date  . Arthritis   . Dementia 09/16/2016  . Hearing aid worn   . Memory deficit   . Prostate cancer [facility])     Past Surgical History:  Procedure Laterality Date  . COLONOSCOPY    . HEMORROIDECTOMY    . PROSTATE BIOPSY    . TONSILLECTOMY AND ADENOIDECTOMY       reports that he quit smoking about 58 years ago. His smoking use included Cigarettes. He has a 2.50 pack-year smoking history. He has quit using smokeless tobacco. He reports that he drinks about 3.6 oz of alcohol per week . He reports that he does not use drugs.  Allergies  Allergen Reactions  . Hydrocodone Other (See Comments)    Reaction:  Hallucinations  . Indocin [Indomethacin] Other (See Comments)    Reaction:  Confusion   . Amoxicillin-Pot Clavulanate Other (See Comments)    Reaction:  Nightmares Has patient had a PCN reaction causing immediate rash, facial/tongue/throat swelling, SOB or lightheadedness with hypotension: No Has patient had a PCN reaction causing severe rash involving mucus membranes or skin necrosis: No Has patient had  a PCN reaction that required hospitalization: No Has patient had a PCN reaction occurring within the last 10 years: Yes If all of the above answers are "NO", then may proceed with Cephalosporin  use.  . Erythromycin Ethylsuccinate Other (See Comments)    Reaction:  Eye irritation   . Ibuprofen Other (See Comments)    Reaction:  Confusion  Pt is able to tolerate smaller doses.     Family History  Problem Relation Age of Onset  . Diverticulitis Father   . Depression Brother   . Cancer Paternal Uncle   . Colon cancer Neg Hx    Per patient's son patient's father deceased in his 13s from a staph infection. Mother deceased age 5 from old age.  Prior to Admission medications   Medication Sig Start Date End Date Taking? Authorizing Provider  Memantine HCl-Donepezil HCl (NAMZARIC) 28-10 MG CP24 Take 28 mg by mouth daily. 02/27/16  Yes Lucille Passy, MD    Physical Exam: Vitals:   09/16/16 1552 09/16/16 1600 09/16/16 1800 09/16/16 1808  BP:  134/69 (!) 155/67 (!) 155/67  Pulse:    77  Resp:  (!) 23 (!) 24 20  Temp: (!) 100.8 F (38.2 C)     TempSrc: Rectal     SpO2:    100%  Weight:        Constitutional: NAD, calm, comfortable Vitals:   09/16/16 1552 09/16/16 1600 09/16/16 1800 09/16/16 1808  BP:  134/69 (!) 155/67 (!) 155/67  Pulse:    77  Resp:  (!) 23 (!) 24 20  Temp: (!) 100.8 F (38.2 C)     TempSrc: Rectal     SpO2:    100%  Weight:       Eyes: PERRLA, lids and conjunctivae normal ENMT: Mucous membranes are extremely dry. Posterior pharynx clear of any exudate or lesions.Normal dentition.  Neck: normal, supple, no masses, no thyromegaly Respiratory: clear to auscultation bilaterally anterior lung fields, no wheezing, no crackles. Normal respiratory effort. No accessory muscle use.  Cardiovascular: Regular rate and rhythm, no murmurs / rubs / gallops. No extremity edema. 2+ pedal pulses. No carotid bruits.  Abdomen: no tenderness, no masses palpated. No hepatosplenomegaly. Bowel sounds positive.  Musculoskeletal: no clubbing / cyanosis. No joint deformity upper and lower extremities. Good ROM, no contractures. Normal muscle tone. Bilateral knees left greater  than right with some warmth and exquisitely tender to palpation. Bilateral feet exquisitely tender to palpation. Right great toe MTP joint tender to palpation.  Skin: no rashes, lesions, ulcers. No induration Neurologic:  patient alert to self only. CN 2-12 grossly intact. Moving extremities spontaneously. Sensation intact, Strength 5/5 in all 4.  Psychiatric:  Poor judgment and insight. Alert and oriented to self only. Normal mood.   (Anything < 9 systems with 2 bullets each down codes to level 1) (If patient refuses exam can't bill higher level) (Make sure to document decubitus ulcers present on admission -- if possible -- and whether patient has chronic indwelling catheter at time of admission)  Labs on Admission: I have personally reviewed following labs and imaging studies  CBC:  Recent Labs Lab 09/12/16 1328 09/12/16 1356 09/16/16 1359  WBC 6.4  --  4.6  NEUTROABS 5.2  --  3.8  HGB 11.2* 10.9* 11.5*  HCT 32.2* 32.0* 32.6*  MCV 91.2  --  91.6  PLT 140*  --  902   Basic Metabolic Panel:  Recent Labs Lab 09/12/16 1328 09/12/16 1356 09/16/16 1359  NA 136 139 139  K 3.5 3.6 3.6  CL 102 101 100*  CO2 24  --  29  GLUCOSE 104* 100* 115*  BUN 21* 23* 14  CREATININE 1.74* 1.70* 1.09  CALCIUM 8.9  --  8.7*   GFR: Estimated Creatinine Clearance: 62.6 mL/min (by C-G formula based on SCr of 1.09 mg/dL). Liver Function Tests:  Recent Labs Lab 09/12/16 1328 09/16/16 1359  AST 49* 78*  ALT 27 58  ALKPHOS 44 48  BILITOT 1.3* 0.9  PROT 6.8 7.2  ALBUMIN 3.7 3.1*   No results for input(s): LIPASE, AMYLASE in the last 168 hours. No results for input(s): AMMONIA in the last 168 hours. Coagulation Profile:  Recent Labs Lab 09/16/16 1359  INR 1.08   Cardiac Enzymes: No results for input(s): CKTOTAL, CKMB, CKMBINDEX, TROPONINI in the last 168 hours. BNP (last 3 results) No results for input(s): PROBNP in the last 8760 hours. HbA1C: No results for input(s): HGBA1C in  the last 72 hours. CBG:  Recent Labs Lab 09/16/16 1346  GLUCAP 104*   Lipid Profile: No results for input(s): CHOL, HDL, LDLCALC, TRIG, CHOLHDL, LDLDIRECT in the last 72 hours. Thyroid Function Tests: No results for input(s): TSH, T4TOTAL, FREET4, T3FREE, THYROIDAB in the last 72 hours. Anemia Panel: No results for input(s): VITAMINB12, FOLATE, FERRITIN, TIBC, IRON, RETICCTPCT in the last 72 hours. Urine analysis:    Component Value Date/Time   COLORURINE YELLOW 09/16/2016 1400   APPEARANCEUR HAZY (A) 09/16/2016 1400   LABSPEC 1.017 09/16/2016 1400   PHURINE 5.0 09/16/2016 1400   GLUCOSEU NEGATIVE 09/16/2016 1400   HGBUR MODERATE (A) 09/16/2016 1400   BILIRUBINUR NEGATIVE 09/16/2016 1400   KETONESUR 80 (A) 09/16/2016 1400   PROTEINUR 100 (A) 09/16/2016 1400   UROBILINOGEN 0.2 07/21/2013 1119   NITRITE POSITIVE (A) 09/16/2016 1400   LEUKOCYTESUR LARGE (A) 09/16/2016 1400    Radiological Exams on Admission: Dg Chest 2 View  Result Date: 09/16/2016 CLINICAL DATA:  Possible sepsis. EXAM: CHEST  2 VIEW COMPARISON:  Radiographs of Jul 14, 2013. FINDINGS: The heart size and mediastinal contours are within normal limits. No pneumothorax or pleural effusion is noted. Mild bibasilar atelectasis or scarring is noted. Elevated left hemidiaphragm is again noted and unchanged. The visualized skeletal structures are unremarkable. IMPRESSION: Mild bibasilar subsegmental atelectasis or scarring. Electronically Signed   By: Marijo Conception, M.D.   On: 09/16/2016 14:54   Dg Foot Complete Right  Result Date: 09/16/2016 CLINICAL DATA:  Seven 76 year old male with right first MTP joint pain and swelling EXAM: RIGHT FOOT COMPLETE - 3+ VIEW COMPARISON:  None. FINDINGS: No evidence of acute fracture or malalignment. No lytic or blastic osseous lesions. No evidence of erosion. Very minimal degenerative change at the great toe MTP joint. Atherosclerotic vascular calcifications noted in the arteries of the  foot. IMPRESSION: 1. No evidence of fracture, malalignment or inflammatory arthropathy. 2. Mild soft tissue swelling about the first MTP joint with associated minimal degenerative osteoarthritic change. Electronically Signed   By: Jacqulynn Cadet M.D.   On: 09/16/2016 16:34   Ct Renal Stone Study  Result Date: 09/16/2016 CLINICAL DATA:  Acute urinary retention EXAM: CT ABDOMEN AND PELVIS WITHOUT CONTRAST TECHNIQUE: Multidetector CT imaging of the abdomen and pelvis was performed following the standard protocol without IV contrast. COMPARISON:  03/31/2016 FINDINGS: Lower chest: Bilateral subpleural fibrosis. No acute consolidation or pleural effusion. Normal heart size. Hepatobiliary: No focal liver abnormality is seen. No gallstones, gallbladder Spikes thickening, or biliary  dilatation. Pancreas: Unremarkable. No pancreatic ductal dilatation or surrounding inflammatory changes. Spleen: Normal in size without focal abnormality. Adrenals/Urinary Tract: Adrenal glands are within normal limits. Kidneys show no hydronephrosis. Punctate nonobstructing stone in the upper pole of the left kidney. Thick-walled bladder with surrounding edema. Small diverticula anteriorly and right posterior. Stomach/Bowel: Stomach is within normal limits. Appendix appears normal. No evidence of bowel [place] thickening, distention, or inflammatory changes. Sigmoid colon diverticular disease without acute inflammation Vascular/Lymphatic: Aortic atherosclerosis. No enlarged abdominal or pelvic lymph nodes. Reproductive: No mass.  Metallic densities at the prostate. Other: No free air. Fat in the right inguinal canal. No free fluid. Musculoskeletal: Degenerative changes. No acute or suspicious bone lesion. IMPRESSION: 1. Thick-walled appearance of the urinary bladder with surrounding stranding suggests possible cystitis. Suggest correlation with urinalysis. 2. Negative for hydronephrosis or ureteral stone. Punctate nonobstructing stone in the  left kidney 3. Sigmoid colon diverticular disease without acute inflammation 4. Bibasilar subpleural fibrosis Electronically Signed   By: Donavan Foil M.D.   On: 09/16/2016 17:19    EKG: Independently reviewed. Poor quality EKG. Repeat EKG.  Assessment/Plan Principal Problem:   Acute encephalopathy Active Problems:   Fever   Acute lower UTI   GOUT   Amnestic MCI (mild cognitive impairment with memory loss)   Malignant neoplasm of prostate (HCC)   Dementia   Acute urinary retention   #1 acute encephalopathy Patient with history of dementia, history of prostate cancer on radiation therapy presented to the ED with hallucinations. Patient is acute encephalopathy likely metabolic in nature likely secondary to urinary tract infection. Patient also noted to have a fever in the ED. Chest x-ray negative for any acute infiltrate. Blood cultures pending. Urine cultures pending. Check a CT head. Check a RPR, RBC folate, B-12 levels, ammonia levels, TSH, HIV. We'll place empirically on IV Rocephin. IV fluids. Follow.  #2 probable CAUTI/UTI Patient recently had Foley catheter placed 3 days prior to admission in the ED for acute urinary retention. Urinalysis is not consistent with a UTI. Urine cultures pending. Foley catheter has been removed. Following patient's urine output. Check a bladder scan every 8 hours for the next 24-48 hours and if patient with Significant Retention Pl., Foley catheter back in. Place on IV Rocephin while urine cultures are pending.  #3 recent diagnosis of acute urinary retention Patient status post Foley catheter placement 3 days prior to admission secondary to urinary retention. Urinalysis concerning for UTI. Foley catheter has been discontinued.  Following patient's urine output. Check a bladder scan every 8 hours for the next 24-48 hours and if patient with Significant Retention place Foley catheter back in. Place on IV Rocephin while urine cultures are pending. Start  Flomax. Will likely need outpatient follow-up with urology.  #4 dementia Continue home regimen.  #5 malignant neoplasm of the prostate Patient receiving radiation therapy. Will inform radiation oncology tomorrow patient's admission.  #6 dehydration IV fluids.  #7 probable acute gouty attack Check a uric acid. Solumedrol 60 mg IV 1 now and then prednisone 40 mg daily 5 days starting 09/17/2016.  #8 fever Likely secondary to UTI. Chest x-ray negative. Blood cultures pending. Urine cultures pending. Placed empirically on IV Rocephin. Follow.   DVT prophylaxis: Lovenox Code Status: DO NOT RESUSCITATE Family Communication: Updated patient and son at bedside. Disposition Plan: Likely home with home health versus skilled nursing facility when medically stable and clinical improvement. Consults called: None Admission status: Admit to inpatient   [facility] MD [facility] Pager [phone]  [phone]  If 7PM-7AM, please contact night-coverage [email or web address] Password [facility]  09/16/2016, 6:44 PM

## 2016-09-16 NOTE — ED Notes (Signed)
ED pyxis out of IV Rocephin. Pharmacy text paged. Awaiting dose to administer to patient.

## 2016-09-16 NOTE — ED Notes (Signed)
Attempted 2 blood draws for blood culture and both attempts were unsuccessful.

## 2016-09-16 NOTE — ED Notes (Signed)
Antibiotics still not available in pyxis. This nurse messaged pharmacy again regarding the missing dose. Previous message states that the request was "done" but not antibiotic was sent.

## 2016-09-16 NOTE — ED Notes (Signed)
Bed: WA17 Expected date:  Expected time:  Means of arrival:  Comments: EMS AMS

## 2016-09-16 NOTE — Clinical Social Work Note (Signed)
Clinical Social Work Assessment  Patient Details  Name: Ricky Boyd MRN: [id number] Date of Birth: 1940/08/18  Date of referral:  09/16/16               Reason for consult:  Facility Placement                Permission sought to share information with:  Facility Art therapist granted to share information::  Yes, Verbal Permission Granted  Name::        Agency::     Relationship::     Contact Information:     Housing/Transportation Living arrangements for the past 2 months:  [place] of Information:  Adult Children Patient Interpreter Needed:  None Criminal Activity/Legal Involvement Pertinent to Current Situation/Hospitalization:    Significant Relationships:  Adult Children Lives with:  Self Do you feel safe going back to the place where you live?  No Need for family participation in patient care:  Yes (Comment) (Pt's son Chananya)  Care giving concerns:  None listed by pt/famil   Social Worker assessment / plan:  CSW met with pt and confirmed pt's plan to be discharged to SNF at discharge if PT recommends CSW provided active listening and validated pt's son's concerns.   CSW Dept was given permission from the pt and the pt's son who is power-of-attorney (son stated medical POA) to complete FL-2 and send referrals out to SNF facilities via the hub per pt's request to all SNF's in [place] and [place].  Pt has been living independently prior to being admitted to [facility].  Pt's son wishes to be included into or directly consulted for all D/C issues/options.  Employment status:  Retired Nurse, adult PT Recommendations:  Not assessed at this time Information / Referral to community resources:     Patient/Family's Response to care:  Patient not alert and oriented.  Patient and pt's son agreeable to plan.  Pt's son and daughter supportive and strongly involved in pt.'s care.  Pt.'s son pleasant and appreciated CSW  intervention.    Patient/Family's Understanding of and Emotional Response to Diagnosis, Current Treatment, and Prognosis:  Still assessing  Emotional Assessment Appearance:  Appears younger than stated age Attitude/Demeanor/Rapport:   (Pt was at times alert/oriented and at times confused) Affect (typically observed):    Orientation:  Oriented to Self Alcohol / Substance use:    Psych involvement (Current and /or in the community):     Discharge Needs  Concerns to be addressed:  No discharge needs identified Readmission within the last 30 days:  No Current discharge risk:  None Barriers to Discharge:  No Barriers Identified   Claudine Mouton, LCSWA 09/16/2016, 7:22 PM

## 2016-09-17 ENCOUNTER — Ambulatory Visit: Payer: Medicare Other

## 2016-09-17 ENCOUNTER — Inpatient Hospital Stay (HOSPITAL_COMMUNITY): Payer: Medicare Other

## 2016-09-17 LAB — BASIC METABOLIC PANEL
Anion gap: 10 (ref 5–15)
BUN: 14 mg/dL (ref 6–20)
CHLORIDE: 104 mmol/L (ref 101–111)
CO2: 24 mmol/L (ref 22–32)
CREATININE: 0.97 mg/dL (ref 0.61–1.24)
Calcium: 8.2 mg/dL — ABNORMAL LOW (ref 8.9–10.3)
Glucose, Bld: 163 mg/dL — ABNORMAL HIGH (ref 65–99)
POTASSIUM: 3.8 mmol/L (ref 3.5–5.1)
SODIUM: 138 mmol/L (ref 135–145)

## 2016-09-17 LAB — GLUCOSE, CAPILLARY
GLUCOSE-CAPILLARY: 160 mg/dL — AB (ref 65–99)
GLUCOSE-CAPILLARY: 134 mg/dL — AB (ref 65–99)
GLUCOSE-CAPILLARY: 148 mg/dL — AB (ref 65–99)

## 2016-09-17 LAB — CBC
HEMATOCRIT: 29.4 % — AB (ref 39.0–52.0)
Hemoglobin: 10.6 g/dL — ABNORMAL LOW (ref 13.0–17.0)
MCH: 31.9 pg (ref 26.0–34.0)
MCHC: 36.1 g/dL — ABNORMAL HIGH (ref 30.0–36.0)
MCV: 88.6 fL (ref 78.0–100.0)
Platelets: 140 10*3/uL — ABNORMAL LOW (ref 150–400)
RBC: 3.32 MIL/uL — AB (ref 4.22–5.81)
RDW: 13.6 % (ref 11.5–15.5)
WBC: 4.2 10*3/uL (ref 4.0–10.5)

## 2016-09-17 LAB — VITAMIN B12: Vitamin B-12: 283 pg/mL (ref 180–914)

## 2016-09-17 LAB — RPR: RPR Ser Ql: NONREACTIVE

## 2016-09-17 LAB — HIV ANTIBODY (ROUTINE TESTING W REFLEX): HIV Screen 4th Generation wRfx: NONREACTIVE

## 2016-09-17 MED ORDER — INSULIN ASPART 100 UNIT/ML ~~LOC~~ SOLN
0.0000 [IU] | Freq: Three times a day (TID) | SUBCUTANEOUS | Status: DC
  Administered 2016-09-17: 2 [IU] via SUBCUTANEOUS
  Administered 2016-09-18 – 2016-09-19 (×3): 1 [IU] via SUBCUTANEOUS
  Administered 2016-09-20 – 2016-09-21 (×4): 2 [IU] via SUBCUTANEOUS
  Administered 2016-09-22: 1 [IU] via SUBCUTANEOUS

## 2016-09-17 MED ORDER — ENSURE ENLIVE PO LIQD
237.0000 mL | Freq: Two times a day (BID) | ORAL | Status: DC
  Administered 2016-09-17 – 2016-09-22 (×9): 237 mL via ORAL

## 2016-09-17 NOTE — Progress Notes (Signed)
Physical Therapy Evaluation Patient Details Name: Ricky Boyd MRN: [id number] DOB: [birth year] Today's Date: 09/17/2016   History of Present Illness  Pt is a 76 y.o. male with PMHx of mild dementia, history of prostate cancer undergoing radiation treatment, and history of gout who presented to the ED with confusions and hallucinations over the past 1-2 days. Pt admitted for acute encephalopathy likely caused by acute UTI.   Clinical Impression  Pt admitted with the conditions listed above. Pt currently with functional limitations due to the deficits listed below. Pt will benefit from skilled PT to increase their independence and safety with mobility to allow discharge. Pt required noxious stimuli and raising HOB to arouse as pt had received sleeping medication the night before. Pt was agitated and resisted movement throughout evaluation. Pt required max assist to sit on EOB but was cold and agitated so pt was placed back in bed. Pt had been sweating in the bed so RN was informed and sheets were changed.     Follow Up Recommendations SNF    Equipment Recommendations  None recommended by PT    Recommendations for Other Services       Precautions / Restrictions Precautions Precautions: Fall Restrictions Weight Bearing Restrictions: No      Mobility  Bed Mobility Overal bed mobility: Needs Assistance Bed Mobility: Sit to Sidelying;Supine to Sit     Supine to sit: +2 for physical assistance;HOB elevated;Max assist   Sit to sidelying: Max assist;+2 for physical assistance;HOB elevated General bed mobility comments: Pt did not respond to noxious stimuli to arouse but woke briefly upon raising HOB, Pt required max assist to get to raise and steady trunk as well as navigate LE. Pt required steadying once sitting at EOB.    Transfers                 General transfer comment: Unable to attempt at this time  Ambulation/Gait             General Gait Details: Unable to  attempt at this time  Stairs            Wheelchair Mobility    Modified Rankin (Stroke Patients Only)       Balance Overall balance assessment: Needs assistance Sitting-balance support: Bilateral upper extremity supported Sitting balance-Leahy Scale: Zero Sitting balance - Comments: Pt sat on EOB but required assist to steady. Pt unable and unwilling to stand at this time.                                     Pertinent Vitals/Pain Pain Assessment: No/denies pain    Home Living Family/patient expects to be discharged to:: Skilled nursing facility Living Arrangements: Alone               Additional Comments: Pt previously lived alone but family thinks he will need additional care following discharge.    Prior Function Level of Independence: Independent         Comments: Pt currently lives alone and was able to function independently prior to admission. Pt's family visits him nearly on a daily basis.      Hand Dominance        Extremity/Trunk Assessment   Upper Extremity Assessment Upper Extremity Assessment: Overall WFL for tasks assessed    Lower Extremity Assessment Lower Extremity Assessment: Difficult to assess due to impaired cognition (Pt resisted movements which demonstrated strength WFL as  well as pt's family states he was playing cornhole last week)       Communication   Communication: HOH  Cognition Arousal/Alertness: Lethargic Behavior During Therapy: Agitated Overall Cognitive Status: Difficult to assess                                 General Comments: Pt had received sleeping medication last night and was hard to wake. Pt did not respond to noxious stimuli but woke briefly upon raising the [place]. Pt was agitated and resisted movement.       General Comments      Exercises     Assessment/Plan    PT Assessment Patient needs continued PT services  PT Problem List Decreased mobility;Decreased safety  awareness;Decreased coordination;Decreased activity tolerance;Decreased balance;Decreased cognition       PT Treatment Interventions Therapeutic activities;Gait training;Patient/family education;Therapeutic exercise;Balance training;Functional mobility training    PT Goals (Current goals can be found in the Care Plan section)  Acute Rehab PT Goals Patient Stated Goal: Son would like pt to return to normal function and playing cornhole  PT Goal Formulation: With patient/family Time For Goal Achievement: 10/01/16 Potential to Achieve Goals: Good    Frequency Min 3X/week   Barriers to discharge        Co-evaluation               AM-PAC PT "6 Clicks" Daily Activity  Outcome Measure Difficulty turning over in bed (including adjusting bedclothes, sheets and blankets)?: Total Difficulty moving from lying on back to sitting on the side of the bed? : Total Difficulty sitting down on and standing up from a chair with arms (e.g., wheelchair, bedside commode, etc,.)?: Total Help needed moving to and from a bed to chair (including a wheelchair)?: A Lot Help needed walking in hospital room?: A Lot Help needed climbing 3-5 steps with a railing? : A Lot 6 Click Score: 9    End of Session   Activity Tolerance: Patient limited by lethargy;Treatment limited secondary to agitation (Pt was difficult to arouse and was agitated upon waking.) Patient left: in bed;with call bell/phone within reach;with family/visitor present Nurse Communication: Mobility status;Other (comment) (Pt sweating and needed sheets changed) PT Visit Diagnosis: Difficulty in walking, not elsewhere classified (R26.2)    Time: 1537-9432 PT Time Calculation (min) (ACUTE ONLY): 23 min   Charges:   PT Evaluation $PT Eval Moderate Complexity: 1 Procedure     PT G Codes:        Olegario Shearer, SPT  Reino Bellis 09/17/2016, 12:31 PM

## 2016-09-17 NOTE — Clinical Social Work Placement (Signed)
   CLINICAL SOCIAL WORK PLACEMENT  NOTE  Date:  09/17/2016  Patient Details  Name: Ricky Boyd MRN: [id number] Date of Birth: 1940-12-17  Clinical Social Work is seeking post-discharge placement for this patient at the [place] level of care (*CSW will initial, date and re-position this form in  chart as items are completed):  Yes   Patient/family provided with [facility] Work Department's list of facilities offering this level of care within the geographic area requested by the patient (or if unable, by the patient's family).  Yes   Patient/family informed of their freedom to choose among providers that offer the needed level of care, that participate in Medicare, Medicaid or managed care program needed by the patient, have an available bed and are willing to accept the patient.  Yes   Patient/family informed of [facility]'s ownership interest in [facility] and [facility], as well as of the fact that they are under no obligation to receive care at these facilities.  PASRR submitted to EDS on 09/17/16     PASRR number received on 09/17/16     Existing PASRR number confirmed on       FL2 transmitted to all facilities in geographic area requested by pt/family on 09/17/16     FL2 transmitted to all facilities within larger geographic area on 09/17/16     Patient informed that his/her managed care company has contracts with or will negotiate with certain facilities, including the following:            Patient/family informed of bed offers received.  Patient chooses bed at       Physician recommends and patient chooses bed at      Patient to be transferred to   on  .  Patient to be transferred to facility by       Patient family notified on   of transfer.  Name of family member notified:        PHYSICIAN Please sign FL2, Please sign DNR     Additional Comment:    _______________________________________________ Lilly Cove,  LCSW 09/17/2016, 1:32 PM

## 2016-09-17 NOTE — Progress Notes (Signed)
PROGRESS NOTE  Ricky Boyd UGQ:[id number] DOB: [birth year] DOA: 09/16/2016 PCP: Lucille Passy, MD   LOS: 1 day   Brief Narrative / Interim history: Ricky Boyd is a 76 y.o. 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  Assessment & Plan: Principal Problem:   Acute encephalopathy Active Problems:   GOUT   Amnestic MCI (mild cognitive impairment with memory loss)   Malignant neoplasm of prostate (HCC)   Dementia   Acute lower UTI   Fever   Acute urinary retention   Acute metabolic encephalopathy due to catheter associated urinary tract infection -Likely in the setting of a urinary tract infection -He appears to be sleeping this morning, can answer basic yes or no questions however appears very drowsy.  He received Ativan last night. -Continue antibiotics, urine cultures are pending  Acute urinary retention -His Foley has been removed, he receives an INR overnight, if he will not have consistent urine output today will need placement of a Foley catheter again  Prostate cancer -Currently undergoing radiation therapy as an outpatient  Dementia -Apparently quite independent per patient's son, continue antibiotics, monitor mental status  Acute kidney injury -Likely in the setting of dehydration, creatinine has now normalized with IV fluids  Probable gouty attack -Received Solu-Medrol last night, continue prednisone, redness and tenderness seem to be improving today   DVT prophylaxis: Lovenox Code Status: DNR Family Communication: son bedside Disposition Plan: home when ready   Consultants:   None   Procedures:   None   Antimicrobials:  Ceftriaxone 7/25 >>   Subjective: -sleepy, denies pain when prompted,  opens eyes intermittently when asked and can shake his head "yes"/"no" to certain questions   Objective: Vitals:   09/16/16 1900 09/16/16 1922 09/16/16 2011 09/17/16 0700  BP: (!) 155/76 (!) 155/76 (!) 160/85   Pulse: 74 82 76   Resp: 19 17 16    Temp:   97.8 F (36.6 C)   TempSrc:   Oral   SpO2: 99% 99% 97%   Weight:   86.5 kg (190 lb 11.2 oz) 87.5 kg (192 lb 14.4 oz)  Height:   5\' 9"  (1.753 m)     Intake/Output Summary (Last 24 hours) at 09/17/16 1212 Last data filed at 09/17/16 0645  Gross per 24 hour  Intake          1927.08 ml  Output             1675 ml  Net           252.08 ml   Filed Weights   09/16/16 1355 09/16/16 2011 09/17/16 0700  Weight: 90.3 kg (199 lb 1.2 oz) 86.5 kg (190 lb 11.2 oz) 87.5 kg (192 lb 14.4 oz)    Examination:  Vitals:   09/16/16 1900 09/16/16 1922 09/16/16 2011 09/17/16 0700  BP: (!) 155/76 (!) 155/76 (!) 160/85   Pulse: 74 82 76   Resp: 19 17 16    Temp:   97.8 F (36.6 C)   TempSrc:   Oral   SpO2: 99% 99% 97%   Weight:   86.5 kg (190 lb 11.2 oz) 87.5 kg (192 lb 14.4 oz)  Height:   5\' 9"  (1.753 m)     Constitutional: NAD Eyes: PERRL, lids and conjunctivae normal ENMT: Mucous membranes  are dry Respiratory: clear to auscultation bilaterally, no wheezing, no crackles. Normal respiratory effort. No accessory muscle use.  Cardiovascular: Regular rate and rhythm, no murmurs / rubs / gallops. No LE edema. 2+ pedal pulses.  Abdomen: no tenderness. Bowel sounds positive.  Skin: no rashes, lesions, ulcers. No induration Neurologic: Appears nonfocal, intermittently following commands  Data Reviewed: I have independently reviewed following labs and imaging studies  CBC:  Recent Labs Lab 09/12/16 1328 09/12/16 1356 09/16/16 1359 09/17/16 0450  WBC 6.4  --  4.6 4.2  NEUTROABS 5.2  --  3.8  --   HGB 11.2* 10.9* 11.5* 10.6*  HCT 32.2* 32.0* 32.6* 29.4*  MCV 91.2  --  91.6 88.6  PLT 140*  --  159 381*   Basic Metabolic Panel:  Recent  Labs Lab 09/12/16 1328 09/12/16 1356 09/16/16 1359 09/16/16 2100 09/17/16 0450  NA 136 139 139  --  138  K 3.5 3.6 3.6  --  3.8  CL 102 101 100*  --  104  CO2 24  --  29  --  24  GLUCOSE 104* 100* 115*  --  163*  BUN 21* 23* 14  --  14  CREATININE 1.74* 1.70* 1.09  --  0.97  CALCIUM 8.9  --  8.7*  --  8.2*  MG  --   --   --  1.6*  --    GFR: Estimated Creatinine Clearance: 68.7 mL/min (by C-G formula based on SCr of 0.97 mg/dL). Liver Function Tests:  Recent Labs Lab 09/12/16 1328 09/16/16 1359  AST 49* 78*  ALT 27 58  ALKPHOS 44 48  BILITOT 1.3* 0.9  PROT 6.8 7.2  ALBUMIN 3.7 3.1*   No results for input(s): LIPASE, AMYLASE in the last 168 hours.  Recent Labs Lab 09/16/16 2118  AMMONIA 13   Coagulation Profile:  Recent Labs Lab 09/16/16 1359  INR 1.08   Cardiac Enzymes: No results for input(s): CKTOTAL, CKMB, CKMBINDEX, TROPONINI in the last 168 hours. BNP (last 3 results) No results for input(s): PROBNP in the last 8760 hours. HbA1C: No results for input(s): HGBA1C in the last 72 hours. CBG:  Recent Labs Lab 09/16/16 1346  GLUCAP 104*   Lipid Profile: No results for input(s): CHOL, HDL, LDLCALC, TRIG, CHOLHDL, LDLDIRECT in the last 72 hours. Thyroid Function Tests:  Recent Labs  09/16/16 2253  TSH 2.186   Anemia Panel:  Recent Labs  09/16/16 2118  VITAMINB12 283   Urine analysis:    Component Value Date/Time   COLORURINE YELLOW 09/16/2016 1400   APPEARANCEUR HAZY (A) 09/16/2016 1400   LABSPEC 1.017 09/16/2016 1400   PHURINE 5.0 09/16/2016 1400   GLUCOSEU NEGATIVE 09/16/2016 1400   HGBUR MODERATE (A) 09/16/2016 1400   BILIRUBINUR NEGATIVE 09/16/2016 1400   KETONESUR 80 (A) 09/16/2016 1400   PROTEINUR 100 (A) 09/16/2016 1400   UROBILINOGEN 0.2 07/21/2013 1119   NITRITE POSITIVE (A) 09/16/2016 1400   LEUKOCYTESUR LARGE (A) 09/16/2016 1400   Sepsis Labs: Invalid input(s): PROCALCITONIN, LACTICIDVEN  Recent Results (from the  past 240 hour(s))  Urine culture     Status: None   Collection Time: 09/12/16  3:06 PM  Result Value Ref Range Status   Specimen Description URINE, CATHETERIZED  Final   Special Requests NONE  Final   Culture NO GROWTH  Final   Report Status 09/13/2016 FINAL  Final  Culture, blood (Routine x 2)     Status: None (Preliminary result)   Collection Time: 09/16/16  1:59 PM  Result Value Ref Range Status   Specimen Description RIGHT ANTECUBITAL  Final   Special Requests   Final    BOTTLES DRAWN AEROBIC AND ANAEROBIC Blood Culture adequate volume   Culture   Final    NO GROWTH < 24 HOURS Performed at [facility], [address] [place], [place] [address]    Report Status PENDING  Incomplete      Radiology Studies: Dg Chest 2 View  Result Date: 09/16/2016 CLINICAL DATA:  Possible sepsis. EXAM: CHEST  2 VIEW COMPARISON:  Radiographs of Jul 14, 2013. FINDINGS: The heart size and mediastinal contours are within normal limits. No pneumothorax or pleural effusion is noted. Mild bibasilar atelectasis or scarring is noted. Elevated left hemidiaphragm is again noted and unchanged. The visualized skeletal structures are unremarkable. IMPRESSION: Mild bibasilar subsegmental atelectasis or scarring. Electronically Signed   By: Marijo Conception, M.D.   On: 09/16/2016 14:54   Ct Head Wo Contrast  Result Date: 09/17/2016 CLINICAL DATA:  Confusion and hallucinations for 2 days. Urinary tract infection. History of dementia. History of prostate cancer on radiation. EXAM: CT HEAD WITHOUT CONTRAST TECHNIQUE: Contiguous axial images were obtained from the base of the skull through the vertex without intravenous contrast. COMPARISON:  None. FINDINGS: Brain: Diffuse cerebral atrophy. Low attention way shin changes in the deep white matter consistent with small vessel ischemia. No evidence of acute infarction, hemorrhage, hydrocephalus, extra-axial collection or mass lesion/mass effect. Vascular: Vascular  calcifications are present. Skull: No depressed skull fractures. Sinuses/Orbits: Mucosal thickening in the paranasal sinuses. No acute air-fluid levels. 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 IMPRESSION: No acute intracranial abnormalities. Chronic atrophy and small vessel ischemic changes. Electronically Signed   By: Lucienne Capers M.D.   On: 09/17/2016 03:45   Dg Foot Complete Right  Result Date: 09/16/2016 CLINICAL DATA:  Seven 76 year old male with right first MTP joint pain and swelling EXAM: RIGHT FOOT COMPLETE - 3+ VIEW COMPARISON:  None. FINDINGS: No evidence of acute fracture or malalignment. No lytic or blastic osseous lesions. No evidence of erosion. Very minimal degenerative change at the great toe MTP joint. Atherosclerotic vascular calcifications noted in the arteries of the foot. IMPRESSION: 1. No evidence of fracture, malalignment or inflammatory arthropathy. 2. Mild soft tissue swelling about the first MTP joint with associated minimal degenerative osteoarthritic change. Electronically Signed   By: Jacqulynn Cadet M.D.   On: 09/16/2016 16:34   Ct Renal Stone Study  Result Date: 09/16/2016 CLINICAL DATA:  Acute urinary retention EXAM: CT ABDOMEN AND PELVIS WITHOUT CONTRAST TECHNIQUE: Multidetector CT imaging of the abdomen and pelvis was performed following the standard protocol without IV contrast. COMPARISON:  03/31/2016 FINDINGS: Lower chest: Bilateral subpleural fibrosis. No acute consolidation or pleural effusion. Normal heart size. Hepatobiliary: No focal liver abnormality is seen. No gallstones, gallbladder Kozel thickening, or biliary dilatation. Pancreas: Unremarkable. No pancreatic ductal dilatation or surrounding inflammatory changes. Spleen: Normal in size without focal abnormality. Adrenals/Urinary Tract: Adrenal glands are within normal limits. Kidneys show no hydronephrosis. Punctate nonobstructing stone in the upper pole of the left  kidney. Thick-walled bladder with surrounding edema. Small diverticula anteriorly and right posterior. Stomach/Bowel: Stomach is within normal limits. Appendix appears normal. No evidence of bowel Donovan thickening, distention, or inflammatory changes. Sigmoid colon diverticular disease without acute inflammation Vascular/Lymphatic: Aortic atherosclerosis. No enlarged abdominal or pelvic lymph nodes. Reproductive: No mass.  Metallic densities at the prostate. Other: No free air.  Fat in the right inguinal canal. No free fluid. Musculoskeletal: Degenerative changes. No acute or suspicious bone lesion. IMPRESSION: 1. Thick-walled appearance of the urinary bladder with surrounding stranding suggests possible cystitis. Suggest correlation with urinalysis. 2. Negative for hydronephrosis or ureteral stone. Punctate nonobstructing stone in the left kidney 3. Sigmoid colon diverticular disease without acute inflammation 4. Bibasilar subpleural fibrosis Electronically Signed   By: Donavan Foil M.D.   On: 09/16/2016 17:19     Scheduled Meds: . memantine  28 mg Oral QHS   And  . donepezil  10 mg Oral QHS  . enoxaparin (LOVENOX) injection  40 mg Subcutaneous Q24H  . feeding supplement (ENSURE ENLIVE)  237 mL Oral BID BM  . insulin aspart  0-9 Units Subcutaneous TID WC  . predniSONE  40 mg Oral QAC breakfast  . sodium chloride flush  3 mL Intravenous Q12H  . tamsulosin  0.4 mg Oral QPC supper   Continuous Infusions: . sodium chloride 125 mL/hr at 09/17/16 0725  . cefTRIAXone (ROCEPHIN)  IV       Marzetta Board, MD, PhD [facility] Pager [phone]  If 7PM-7AM, please contact night-coverage [email or web address] Password [facility] 09/17/2016, 12:17 PM

## 2016-09-17 NOTE — Progress Notes (Signed)
OT Cancellation Note  Patient Details Name: Ricky Boyd MRN: [id number] DOB: March 23, 1940   Cancelled Treatment:    Reason Eval/Treat Not Completed: Other (comment).  Pt was sleepy with PT and was sleeping in the late am when OT went by. Will reattempt this afternoon as schedule permits.    Ricky Boyd 09/17/2016, 12:21 PM  Lesle Chris, OTR/L [phone] 09/17/2016

## 2016-09-17 NOTE — [place] FL2 (Signed)
[place] MEDICAID FL2 LEVEL OF CARE SCREENING TOOL     IDENTIFICATION  Patient Name: Ricky Boyd Birthdate: 15-Dec-1940 Sex: male Admission Date (Current Location): 09/16/2016  [facility] and [place] Number:  Herbalist and Address:  [facility],  [place] [address] [place]      Provider Number: [id number]  Attending Physician Name and Address:  Caren Griffins, MD  Relative Name and Phone Number:       Current Level of Care: Hospital Recommended Level of Care: [place] Prior Approval Number:    Date Approved/Denied:   PASRR Number:   [id number]  Discharge Plan: SNF    Current Diagnoses: Patient Active Problem List   Diagnosis Date Noted  . Acute encephalopathy 09/16/2016  . Dementia 09/16/2016  . Acute lower UTI 09/16/2016  . Fever 09/16/2016  . Acute urinary retention 09/16/2016  . Urinary tract infection without hematuria   . Dehydration   . Malignant neoplasm of prostate ([place]) 04/23/2016  . Sinusitis, acute 03/06/2016  . Erectile dysfunction 07/23/2015  . 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 Elevated PSA, less than 10 ng/ml 06/27/2012  . Impotence 01/28/2012  . GOUT 12/26/2008  . MEMORY LOSS 12/26/2008  . 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 History of colonic polyps 10/28/2006  . 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    Orientation RESPIRATION BLADDER Height & Weight     Self, Place  Normal External catheter, Continent Weight: 192 lb 14.4 oz (87.5 kg) Height:  5\' 9"  (175.3 cm)  BEHAVIORAL SYMPTOMS/MOOD NEUROLOGICAL BOWEL NUTRITION STATUS      Continent Diet (See DC summary)  Ensure Enlive po BID, each supplement provides 350 kcal and 20 grams of protein  AMBULATORY STATUS COMMUNICATION OF NEEDS Skin   Extensive Assist Verbally Normal                       Personal Care Assistance Level of Assistance  Bathing, Feeding, Dressing Bathing Assistance: Maximum  assistance Feeding assistance: Independent Dressing Assistance: Maximum assistance     Functional Limitations Info  Sight, Hearing, Speech Sight Info: Adequate Hearing Info: Inadequate  Wears hearing aide Speech Info: Adequate    SPECIAL CARE FACTORS FREQUENCY  PT (By licensed PT), OT (By licensed OT)     PT Frequency: 5x OT Frequency: 5x            Contractures Contractures Info: Not present    Additional Factors Info  Code Status, Allergies Code Status Info: DNR Allergies Info:  Hydrocodone, Indocin Indomethacin, Amoxicillin-pot Clavulanate, Erythromycin Ethylsuccinate, Ibuprofen           Current Medications (09/17/2016):  This is the current hospital active medication list Current Facility-Administered Medications  Medication Dose Route Frequency Provider Last Rate Last Dose  . 0.9 %  sodium chloride infusion   Intravenous Continuous Eugenie Filler, MD 125 mL/hr at 09/17/16 0725    . acetaminophen (TYLENOL) tablet 650 mg  650 mg Oral Q6H PRN Eugenie Filler, MD       Or  . acetaminophen (TYLENOL) suppository 650 mg  650 mg Rectal Q6H PRN Eugenie Filler, MD      . albuterol (PROVENTIL) (2.5 MG/3ML) 0.083% nebulizer solution 2.5 mg  2.5 mg Nebulization Q2H PRN Eugenie Filler, MD      . cefTRIAXone (ROCEPHIN) 1 g in dextrose 5 % 50 mL IVPB  1 g Intravenous Q24H Eugenie Filler, MD      .  memantine (NAMENDA XR) 24 hr capsule 28 mg  28 mg Oral QHS Eugenie Filler, MD   28 mg at 09/16/16 2331   And  . donepezil (ARICEPT) tablet 10 mg  10 mg Oral QHS Eugenie Filler, MD   10 mg at 09/16/16 2331  . enoxaparin (LOVENOX) injection 40 mg  40 mg Subcutaneous Q24H Eugenie Filler, MD   40 mg at 09/16/16 2331  . feeding supplement (ENSURE ENLIVE) (ENSURE ENLIVE) liquid 237 mL  237 mL Oral BID BM Gherghe, Costin M, MD      . insulin aspart (novoLOG) injection 0-9 Units  0-9 Units Subcutaneous TID WC Caren Griffins, MD   2 Units at 09/17/16 1250  .  ondansetron (ZOFRAN) tablet 4 mg  4 mg Oral Q6H PRN Eugenie Filler, MD       Or  . ondansetron [facility]) injection 4 mg  4 mg Intravenous Q6H PRN Eugenie Filler, MD      . predniSONE (DELTASONE) tablet 40 mg  40 mg Oral QAC breakfast Eugenie Filler, MD   40 mg at 09/17/16 1036  . sodium chloride flush (NS) 0.9 % injection 3 mL  3 mL Intravenous Q12H Eugenie Filler, MD   3 mL at 09/17/16 1251  . sodium phosphate (FLEET) 7-19 GM/118ML enema 1 enema  1 enema Rectal Once PRN Eugenie Filler, MD      . sorbitol 70 % solution 30 mL  30 mL Oral Daily PRN Eugenie Filler, MD      . tamsulosin [facility]) capsule 0.4 mg  0.4 mg Oral QPC supper Eugenie Filler, MD         Discharge Medications: Please see discharge summary for a list of discharge medications.  Relevant Imaging Results:  Relevant Lab Results:   Additional Information SSN:  [id number]  Lilly Cove, [place]

## 2016-09-17 NOTE — Progress Notes (Signed)
No urine noted from condom cath bag.  Order to I and O cath x 1;  700cc clear yellow urine w/ few small bld clots noted

## 2016-09-17 NOTE — Progress Notes (Signed)
I and O cath for 475 clear yellow urine w/ few small bld clots

## 2016-09-17 NOTE — Progress Notes (Signed)
Initial Nutrition Assessment  DOCUMENTATION CODES:   Not applicable  INTERVENTION:   Ensure Enlive po BID, each supplement provides 350 kcal and 20 grams of protein  NUTRITION DIAGNOSIS:   Inadequate oral intake related to acute illness (altered mental status ) as evidenced by meal completion < 25%.  GOAL:   Patient will meet greater than or equal to 90% of their needs  MONITOR:   PO intake, Supplement acceptance, Labs, Weight trends  REASON FOR ASSESSMENT:   Malnutrition Screening Tool    ASSESSMENT:   76 y.o. male with medical history significant of mild dementia (able to function independently and lives alone with family visiting almost daily), history of prostate cancer undergoing radiation treatment, history of gout who presented to the ED with confusions and hallucinations over the past 1-2 days. Found to have UTI   Visited pt's room today; unable to awake pt. Per family friend at bedside, nobody has been able to wake pt up all morning. Pt's friend reports that he is normally a good eater but that over the past few days, he has not really been acting like himself or been eating well. Pt has been having diarrhea for several days and family friend reports that everything he eats "runs right through him". Per chart, pt has lost 10lbs(5%) in 4 months; this is not significant. RD will order supplements.   Medications reviewed and include: lovenox, prednisone, ceftriaxone  Labs reviewed: Ca 8.2(L)  Nutrition-Focused physical exam completed. Findings are no fat depletion, no muscle depletion, and mild edema in BLE.   Diet Order:  Diet regular Room service appropriate? Yes; Fluid consistency: Thin  Skin:  Reviewed, no issues  Last BM:  7/24  Height:   Ht Readings from Last 1 Encounters:  09/16/16 5\' 9"  (1.753 m)    Weight:   Wt Readings from Last 1 Encounters:  09/17/16 192 lb 14.4 oz (87.5 kg)    Ideal Body Weight:  72.7 kg  BMI:  Body mass index is 28.49  kg/m.  Estimated Nutritional Needs:   Kcal:  1900-2200kcal/day   Protein:  87-104g/day   Fluid:  >1.9L/day   EDUCATION NEEDS:   Education needs no appropriate at this time  Koleen Distance MS, RD, [place] Pager #[phone] After Hours Pager: [phone]

## 2016-09-17 NOTE — Progress Notes (Signed)
OT Cancellation Note  Patient Details Name: Ricky Boyd MRN: [id number] DOB: December 01, 1940   Cancelled Treatment:    Reason Eval/Treat Not Completed: Other (comment).  Pt remains soundly asleep. Will check back tomorrow.  Zykia Walla 09/17/2016, 2:35 PM  Lesle Chris, OTR/L [phone] 09/17/2016

## 2016-09-17 NOTE — Progress Notes (Signed)
Assessment reviewed, changes noted, pt with urinary retention, MD notified, Coude cath 57F order received and placed. Pt tol proc well, 650 cc amber urine noted. SRP, RN

## 2016-09-18 ENCOUNTER — Ambulatory Visit: Payer: Medicare Other

## 2016-09-18 DIAGNOSIS — R410 Disorientation, unspecified: Secondary | ICD-10-CM

## 2016-09-18 LAB — GLUCOSE, CAPILLARY
GLUCOSE-CAPILLARY: 93 mg/dL (ref 65–99)
GLUCOSE-CAPILLARY: 143 mg/dL — AB (ref 65–99)
GLUCOSE-CAPILLARY: 145 mg/dL — AB (ref 65–99)
Glucose-Capillary: 133 mg/dL — ABNORMAL HIGH (ref 65–99)

## 2016-09-18 LAB — FOLATE RBC
FOLATE, HEMOLYSATE: 347.8 ng/mL
Folate, RBC: 1083 ng/mL (ref >498)
Hematocrit: 32.1 % — ABNORMAL LOW (ref 37.5–51.0)

## 2016-09-18 MED ORDER — PREDNISONE 20 MG PO TABS
40.0000 mg | ORAL_TABLET | Freq: Every day | ORAL | Status: DC
Start: 2016-09-19 — End: 2016-10-08

## 2016-09-18 MED ORDER — TAMSULOSIN HCL 0.4 MG PO CAPS: 0.4000 mg | ORAL_CAPSULE | Freq: Every day | ORAL | Status: DC

## 2016-09-18 NOTE — Progress Notes (Signed)
Physical Therapy Treatment Patient Details Name: Ricky Boyd MRN: [id number] DOB: 10-26-1940 Today's Date: 09/18/2016    History of Present Illness Pt is a 76 y.o. male with PMHx of mild dementia, history of prostate cancer undergoing radiation treatment, and history of gout who presented to the ED with confusions and hallucinations over the past 1-2 days. Pt admitted for acute encephalopathy likely caused by acute UTI.     PT Comments    Progressing slowly with mobility. Continue to recommend SNF.    Follow Up Recommendations  SNF     Equipment Recommendations  None recommended by PT    Recommendations for Other Services       Precautions / Restrictions Precautions Precautions: Fall Restrictions Weight Bearing Restrictions: No    Mobility  Bed Mobility Overal bed mobility: Needs Assistance Bed Mobility: Supine to Sit;Sit to Supine     Supine to sit: HOB elevated;Mod assist Sit to supine: Mod assist   General bed mobility comments: Assist for trunk and scooting. Repeated multimodal cueing required. Increased time.   Transfers Overall transfer level: Needs assistance Equipment used: Rolling walker (2 wheeled) Transfers: Sit to/from Stand Sit to Stand: Mod assist;From elevated surface;+2 physical assistance         General transfer comment: Mod assist +2 from bed, Mod assist from [facility]. Repeated multimodal cueing required. Increased time. Assist to rise, stabilize, control descent. Tends to initially have a posterior bias with standing.   Ambulation/Gait Ambulation/Gait assistance: Min assist;+2 physical assistance;+2 safety/equipment Ambulation Distance (Feet): 20 Feet Assistive device: Rolling walker (2 wheeled) Gait Pattern/deviations: Step-through pattern;Step-to pattern;Decreased stride length;Trunk flexed     General Gait Details: Assist to stabilize pt and maneuver safely with a RW. Distance limited by pain, fatigue and need for toileting. Multimodal  cueing required.    Stairs            Wheelchair Mobility    Modified Rankin (Stroke Patients Only)       Balance Overall balance assessment: Needs assistance   Sitting balance-Leahy Scale: Good     Standing balance support: Bilateral upper extremity supported Standing balance-Leahy Scale: Poor                              Cognition Arousal/Alertness: Awake/alert Behavior During Therapy: WFL for tasks assessed/performed Overall Cognitive Status: Impaired/Different from baseline Area of Impairment: Following commands;Awareness;Problem solving                       Following Commands: Follows one step commands inconsistently     Problem Solving: Slow processing;Decreased initiation;Difficulty sequencing;Requires verbal cues;Requires tactile cues General Comments: difficulty with following directional commands:  multimodal cues needed at times.  Extra time to initiate      Exercises      General Comments        Pertinent Vitals/Pain Pain Assessment: Faces Faces Pain Scale: Hurts even more Pain Location: LEs Pain Descriptors / Indicators: Sore;Aching Pain Intervention(s): Monitored during session;Repositioned    Home Living                      Prior Function            PT Goals (current goals can now be found in the care plan section) Progress towards PT goals: Progressing toward goals    Frequency    Min 3X/week      PT Plan Current plan remains appropriate  Co-evaluation              AM-PAC PT "6 Clicks" Daily Activity  Outcome Measure  Difficulty turning over in bed (including adjusting bedclothes, sheets and blankets)?: Total Difficulty moving from lying on back to sitting on the side of the bed? : Total Difficulty sitting down on and standing up from a chair with arms (e.g., wheelchair, bedside commode, etc,.)?: Total Help needed moving to and from a bed to chair (including a wheelchair)?: A  Lot Help needed walking in hospital room?: A Lot Help needed climbing 3-5 steps with a railing? : A Lot 6 Click Score: 9    End of Session Equipment Utilized During Treatment: Gait belt Activity Tolerance: Patient limited by fatigue Patient left: in bed;with call bell/phone within reach;with family/visitor present   PT Visit Diagnosis: Muscle weakness (generalized) (M62.81);Difficulty in walking, not elsewhere classified (R26.2)     Time: 8916-9450 PT Time Calculation (min) (ACUTE ONLY): 27 min  Charges:  $Gait Training: 8-22 mins $Therapeutic Activity: 8-22 mins                    G Codes:          Weston Anna, MPT Pager: [phone]

## 2016-09-18 NOTE — Evaluation (Signed)
Occupational Therapy Evaluation Patient Details Name: Ricky Boyd MRN: [id number] DOB: Oct 25, 1940 Today's Date: 09/18/2016    History of Present Illness Pt is a 76 y.o. male with PMHx of mild dementia, history of prostate cancer undergoing radiation treatment, and history of gout who presented to the ED with confusions and hallucinations over the past 1-2 days. Pt admitted for acute encephalopathy likely caused by acute UTI.    Clinical Impression   Pt was admitted for the above.  At baseline, he lives alone and was independent.  Pt needs mod +2 assistance to stand for adls and up to max +2 for LB dressing.  Pt had difficulty with directionality and needed extra time to initiate.  He will benefit from continued OT to increase safety and independence with adls.  Recommend continued OT at SNF      Follow Up Recommendations  SNF    Equipment Recommendations  3 in 1 bedside commode    Recommendations for Other Services       Precautions / Restrictions Precautions Precautions: Fall Restrictions Weight Bearing Restrictions: No      Mobility Bed Mobility               General bed mobility comments: oob.  pt had difficulty processing scooting forward in chair, max +2 assist  Transfers Overall transfer level: Needs assistance   Transfers: Sit to/from Stand Sit to Stand: Mod assist;+2 physical assistance         General transfer comment: assist to power up and stabilize.  Transitions are slow and cues given for posture (multi modal).      Balance                                           ADL either performed or assessed with clinical judgement   ADL Overall ADL's : Needs assistance/impaired Eating/Feeding: Independent   Grooming: Set up;Sitting   Upper Body Bathing: Set up;Sitting   Lower Body Bathing: Moderate assistance;+2 for physical assistance;Sit to/from stand   Upper Body Dressing : Minimal assistance;Sitting   Lower Body Dressing:  Maximal assistance;Sit to/from stand;+2 for physical assistance                 General ADL Comments: Pt stood and took one step forward and backwards     Vision         Perception     Praxis      Pertinent Vitals/Pain Pain Assessment: Faces Faces Pain Scale: Hurts even more Pain Location: bil inner thighs with standing Pain Descriptors / Indicators: Aching Pain Intervention(s): Limited activity within patient's tolerance;Monitored during session;Premedicated before session;Repositioned     Hand Dominance     Extremity/Trunk Assessment Upper Extremity Assessment Upper Extremity Assessment: Overall WFL for tasks assessed           Communication Communication Communication: No difficulties   Cognition Arousal/Alertness: Awake/alert Behavior During Therapy: WFL for tasks assessed/performed Overall Cognitive Status: No family/caregiver present to determine baseline cognitive functioning                                 General Comments: difficulty with following directional commands:  multimodal cues needed at times.  Extra time to initiate   General Comments       Exercises     Shoulder Instructions  Home Living Family/patient expects to be discharged to:: Skilled nursing facility Living Arrangements: Alone                               Additional Comments: Pt previously lived alone but family thinks he will need additional care following discharge.      Prior Functioning/Environment Level of Independence: Independent                 OT Problem List: Decreased strength;Decreased activity tolerance;Impaired balance (sitting and/or standing);Decreased cognition;Decreased knowledge of use of DME or AE;Decreased knowledge of precautions;Pain      OT Treatment/Interventions: Self-care/ADL training;DME and/or AE instruction;Balance training;Patient/family education;Therapeutic activities    OT Goals(Current goals can  be found in the care plan section) Acute Rehab OT Goals Patient Stated Goal: Son would like pt to return to normal function and playing cornhole  OT Goal Formulation: With patient Time For Goal Achievement: 09/25/16 Potential to Achieve Goals: Good ADL Goals Pt Will Perform Grooming: (P) with min assist;standing Pt Will Perform Lower Body Bathing: (P) with min assist;sit to/from stand;with adaptive equipment (+2) Pt Will Perform Lower Body Dressing: (P) with min assist;with adaptive equipment;sit to/from stand (+2) Pt Will Transfer to Toilet: (P) with min assist;with +2 assist;stand pivot transfer;bedside commode  OT Frequency: Min 2X/week   Barriers to D/C:            Co-evaluation              AM-PAC PT "6 Clicks" Daily Activity     Outcome Measure Help from another person eating meals?: None Help from another person taking care of personal grooming?: A Little Help from another person toileting, which includes using toliet, bedpan, or urinal?: A Lot Help from another person bathing (including washing, rinsing, drying)?: A Lot Help from another person to put on and taking off regular upper body clothing?: A Little Help from another person to put on and taking off regular lower body clothing?: A Lot 6 Click Score: 16   End of Session    Activity Tolerance: Patient tolerated treatment well Patient left: in chair;with chair alarm set;with call bell/phone within reach  OT Visit Diagnosis: Muscle weakness (generalized) (M62.81);Other symptoms and signs involving cognitive function                Time: 0832-0903 OT Time Calculation (min): 31 min Charges:  OT General Charges $OT Visit: 1 Procedure OT Evaluation $OT Eval Moderate Complexity: 1 Procedure OT Treatments $Self Care/Home Management : 8-22 mins G-Codes:     [place], OTR/L [phone] 09/18/2016  Ricky Boyd 09/18/2016, 9:42 AM

## 2016-09-18 NOTE — Progress Notes (Addendum)
CSW met with patient, son and daughter at bedside, provided list of bed offers. Patient and family have chosen [facility]. CSW contacted facility and confirmed bed. Facility will accept d/c on Sat. Or Sun.  They will need updated FL2 and D/C Summary before 5:00 today.  Physician notified.  4:30 Patient family has decided to go to [facility], it is closer to families home. CSW reached out to liaison at [facility] they are agreeable to accept patient over weekend. Weekend CSW Will assist with discharge, if pt. Is medically stable.   Kathrin Greathouse, Latanya Presser, MSW Clinical Social Worker 5E and Psychiatric Service Line [phone] 09/18/2016  11:28 AM

## 2016-09-18 NOTE — Progress Notes (Signed)
PROGRESS NOTE  Mendell Bontempo MMH:[id number] DOB: 22-Jun-1940 DOA: 09/16/2016 PCP: Lucille Passy, MD   LOS: 2 days   Brief Narrative / Interim history: Nezar Buckles is a 76 y.o. 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  Assessment & Plan: Principal Problem:   Acute encephalopathy Active Problems:   GOUT   Amnestic MCI (mild cognitive impairment with memory loss)   Malignant neoplasm of prostate (HCC)   Dementia   Acute lower UTI   Fever   Acute urinary retention   Acute metabolic encephalopathy due to catheter associated urinary tract infection -Likely in the setting of a urinary tract infection -Much improved this morning, mental status close to baseline, has underlying dementia but is alert, unable to carry a conversation.  Ate a good breakfast. -Continue IV ceftriaxone, urine cultures are pending -Has intermittent agitation overnight, pulled out his IVs  Acute urinary retention -Foley catheter placed 7/26, will likely need to leave the hospital with a Foley catheter and have outpatient follow-up with urology  Prostate cancer -Currently undergoing radiation therapy as an outpatient  Diarrhea -Likely related to proctitis as it appeared temporally related to radiation therapy.  Dementia -Continue to monitor mental status,  Acute kidney injury -Likely in the setting of dehydration, creatinine has now normalized with IV fluids -Discontinue IV fluids today as patient has good p.o. intake  Probable gouty attack -Received Solu-Medrol on admission, continue prednisone, redness and tenderness seem to be improving today   DVT prophylaxis: Lovenox Code Status: DNR Family Communication: son bedside Disposition Plan:  SNF 1-2 days  Consultants:   None   Procedures:   None   Antimicrobials:  Ceftriaxone 7/25 >>   Subjective: -Alert, confused, cannot tell me that he was diagnosed with cancer however he is not remembering which type of cancer it is.  Eating breakfast.  Objective: Vitals:   09/17/16 0700 09/17/16 1453 09/17/16 2136 09/18/16 0603  BP:  (!) 112/53 122/72 120/70  Pulse:  60 71 69  Resp:  18 20 18   Temp:  98.4 F (36.9 C) 97.8 F (36.6 C) 98.2 F (36.8 C)  TempSrc:  Axillary Oral Oral  SpO2:  97% 99% 96%  Weight: 87.5 kg (192 lb 14.4 oz)   89.4 kg (197 lb 1.5 oz)  Height:        Intake/Output Summary (Last 24 hours) at 09/18/16 1244 Last data filed at 09/18/16 0603  Gross per 24 hour  Intake             2790 ml  Output             1750 ml  Net             1040 ml   Filed Weights   09/16/16 2011 09/17/16 0700 09/18/16 0603  Weight: 86.5 kg (190 lb 11.2 oz) 87.5 kg (192 lb 14.4 oz) 89.4 kg (197 lb 1.5 oz)    Examination:  Vitals:   09/17/16 0700 09/17/16 1453 09/17/16 2136 09/18/16 0603  BP:  (!) 112/53 122/72 120/70  Pulse:  60 71 69  Resp:  18 20 18   Temp:  98.4 F (36.9 C) 97.8 F (36.6 C) 98.2 F (36.8 C)  TempSrc:  Axillary Oral Oral  SpO2:  97% 99% 96%  Weight: 87.5  kg (192 lb 14.4 oz)   89.4 kg (197 lb 1.5 oz)  Height:        Constitutional: NAD, calm, comfortable Eyes: lids and conjunctivae normal ENMT: Mucous membranes are moist.  Respiratory: clear to auscultation bilaterally, no wheezing, no crackles. Normal respiratory effort.  Cardiovascular: Regular rate and rhythm, no murmurs / rubs / gallops. No LE edema. 2+ pedal pulses.  Abdomen: no tenderness. Bowel sounds positive.  Neurologic: non focal   Data Reviewed: I have independently reviewed following labs and imaging studies  CBC:  Recent Labs Lab 09/12/16 1328 09/12/16 1356 09/16/16 1359 09/17/16 0450  WBC 6.4  --  4.6 4.2  NEUTROABS 5.2  --  3.8  --   HGB 11.2* 10.9* 11.5*  10.6*  HCT 32.2* 32.0* 32.6* 29.4*  MCV 91.2  --  91.6 88.6  PLT 140*  --  159 122*   Basic Metabolic Panel:  Recent Labs Lab 09/12/16 1328 09/12/16 1356 09/16/16 1359 09/16/16 2100 09/17/16 0450  NA 136 139 139  --  138  K 3.5 3.6 3.6  --  3.8  CL 102 101 100*  --  104  CO2 24  --  29  --  24  GLUCOSE 104* 100* 115*  --  163*  BUN 21* 23* 14  --  14  CREATININE 1.74* 1.70* 1.09  --  0.97  CALCIUM 8.9  --  8.7*  --  8.2*  MG  --   --   --  1.6*  --    GFR: Estimated Creatinine Clearance: 69.4 mL/min (by C-G formula based on SCr of 0.97 mg/dL). Liver Function Tests:  Recent Labs Lab 09/12/16 1328 09/16/16 1359  AST 49* 78*  ALT 27 58  ALKPHOS 44 48  BILITOT 1.3* 0.9  PROT 6.8 7.2  ALBUMIN 3.7 3.1*   No results for input(s): LIPASE, AMYLASE in the last 168 hours.  Recent Labs Lab 09/16/16 2118  AMMONIA 13   Coagulation Profile:  Recent Labs Lab 09/16/16 1359  INR 1.08   Cardiac Enzymes: No results for input(s): CKTOTAL, CKMB, CKMBINDEX, TROPONINI in the last 168 hours. BNP (last 3 results) No results for input(s): PROBNP in the last 8760 hours. HbA1C: No results for input(s): HGBA1C in the last 72 hours. CBG:  Recent Labs Lab 09/16/16 1346 09/17/16 1146 09/17/16 1657 09/17/16 2134 09/18/16 0728  GLUCAP 104* 160* 134* 148* 93   Lipid Profile: No results for input(s): CHOL, HDL, LDLCALC, TRIG, CHOLHDL, LDLDIRECT in the last 72 hours. Thyroid Function Tests:  Recent Labs  09/16/16 2253  TSH 2.186   Anemia Panel:  Recent Labs  09/16/16 2118  VITAMINB12 283   Urine analysis:    Component Value Date/Time   COLORURINE YELLOW 09/16/2016 1400   APPEARANCEUR HAZY (A) 09/16/2016 1400   LABSPEC 1.017 09/16/2016 1400   PHURINE 5.0 09/16/2016 1400   GLUCOSEU NEGATIVE 09/16/2016 1400   HGBUR MODERATE (A) 09/16/2016 1400   BILIRUBINUR NEGATIVE 09/16/2016 1400   KETONESUR 80 (A) 09/16/2016 1400   PROTEINUR 100 (A) 09/16/2016 1400    UROBILINOGEN 0.2 07/21/2013 1119   NITRITE POSITIVE (A) 09/16/2016 1400   LEUKOCYTESUR LARGE (A) 09/16/2016 1400   Sepsis Labs: Invalid input(s): PROCALCITONIN, LACTICIDVEN  Recent Results (from the past 240 hour(s))  Urine culture     Status: None   Collection Time: 09/12/16  3:06 PM  Result Value Ref Range Status   Specimen Description URINE, CATHETERIZED  Final   Special Requests NONE  Final  Culture NO GROWTH  Final   Report Status 09/13/2016 FINAL  Final  Culture, blood (Routine x 2)     Status: None (Preliminary result)   Collection Time: 09/16/16  1:59 PM  Result Value Ref Range Status   Specimen Description RIGHT ANTECUBITAL  Final   Special Requests   Final    BOTTLES DRAWN AEROBIC AND ANAEROBIC Blood Culture adequate volume   Culture   Final    NO GROWTH 2 DAYS Performed at [facility], [address] [place], [place] [address]    Report Status PENDING  Incomplete  Urine culture     Status: None (Preliminary result)   Collection Time: 09/16/16  2:00 PM  Result Value Ref Range Status   Specimen Description URINE, CATHETERIZED  Final   Special Requests Normal  Final   Culture   Final    CULTURE REINCUBATED FOR BETTER GROWTH Performed at [facility], [place] [address], [place], [place] [address]    Report Status PENDING  Incomplete  Culture, blood (Routine x 2)     Status: None (Preliminary result)   Collection Time: 09/16/16  9:18 PM  Result Value Ref Range Status   Specimen Description BLOOD BLOOD RIGHT HAND  Final   Special Requests IN PEDIATRIC BOTTLE Blood Culture adequate volume  Final   Culture   Final    NO GROWTH 1 DAY Performed at [facility], [place] [address], [place], [place] [address]    Report Status PENDING  Incomplete      Radiology Studies: Dg Chest 2 View  Result Date: 09/16/2016 CLINICAL DATA:  Possible sepsis. EXAM: CHEST  2 VIEW COMPARISON:  Radiographs of Jul 14, 2013. FINDINGS: The heart size and mediastinal contours  are within normal limits. No pneumothorax or pleural effusion is noted. Mild bibasilar atelectasis or scarring is noted. Elevated left hemidiaphragm is again noted and unchanged. The visualized skeletal structures are unremarkable. IMPRESSION: Mild bibasilar subsegmental atelectasis or scarring. Electronically Signed   By: Marijo Conception, M.D.   On: 09/16/2016 14:54   Ct Head Wo Contrast  Result Date: 09/17/2016 CLINICAL DATA:  Confusion and hallucinations for 2 days. Urinary tract infection. History of dementia. History of prostate cancer on radiation. EXAM: CT HEAD WITHOUT CONTRAST TECHNIQUE: Contiguous axial images were obtained from the base of the skull through the vertex without intravenous contrast. COMPARISON:  None. FINDINGS: Brain: Diffuse cerebral atrophy. Low attention way shin changes in the deep white matter consistent with small vessel ischemia. No evidence of acute infarction, hemorrhage, hydrocephalus, extra-axial collection or mass lesion/mass effect. Vascular: Vascular calcifications are present. Skull: No depressed skull fractures. Sinuses/Orbits: Mucosal thickening in the paranasal sinuses. No acute air-fluid levels. 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 IMPRESSION: No acute intracranial abnormalities. Chronic atrophy and small vessel ischemic changes. Electronically Signed   By: Lucienne Capers M.D.   On: 09/17/2016 03:45   Dg Foot Complete Right  Result Date: 09/16/2016 CLINICAL DATA:  Seven 76 year old male with right first MTP joint pain and swelling EXAM: RIGHT FOOT COMPLETE - 3+ VIEW COMPARISON:  None. FINDINGS: No evidence of acute fracture or malalignment. No lytic or blastic osseous lesions. No evidence of erosion. Very minimal degenerative change at the great toe MTP joint. Atherosclerotic vascular calcifications noted in the arteries of the foot. IMPRESSION: 1. No evidence of fracture, malalignment or inflammatory arthropathy. 2. Mild  soft tissue swelling about the first MTP joint with associated  minimal degenerative osteoarthritic change. Electronically Signed   By: Jacqulynn Cadet M.D.   On: 09/16/2016 16:34   Ct Renal Stone Study  Result Date: 09/16/2016 CLINICAL DATA:  Acute urinary retention EXAM: CT ABDOMEN AND PELVIS WITHOUT CONTRAST TECHNIQUE: Multidetector CT imaging of the abdomen and pelvis was performed following the standard protocol without IV contrast. COMPARISON:  03/31/2016 FINDINGS: Lower chest: Bilateral subpleural fibrosis. No acute consolidation or pleural effusion. Normal heart size. Hepatobiliary: No focal liver abnormality is seen. No gallstones, gallbladder Mclinden thickening, or biliary dilatation. Pancreas: Unremarkable. No pancreatic ductal dilatation or surrounding inflammatory changes. Spleen: Normal in size without focal abnormality. Adrenals/Urinary Tract: Adrenal glands are within normal limits. Kidneys show no hydronephrosis. Punctate nonobstructing stone in the upper pole of the left kidney. Thick-walled bladder with surrounding edema. Small diverticula anteriorly and right posterior. Stomach/Bowel: Stomach is within normal limits. Appendix appears normal. No evidence of bowel Peach thickening, distention, or inflammatory changes. Sigmoid colon diverticular disease without acute inflammation Vascular/Lymphatic: Aortic atherosclerosis. No enlarged abdominal or pelvic lymph nodes. Reproductive: No mass.  Metallic densities at the prostate. Other: No free air. Fat in the right inguinal canal. No free fluid. Musculoskeletal: Degenerative changes. No acute or suspicious bone lesion. IMPRESSION: 1. Thick-walled appearance of the urinary bladder with surrounding stranding suggests possible cystitis. Suggest correlation with urinalysis. 2. Negative for hydronephrosis or ureteral stone. Punctate nonobstructing stone in the left kidney 3. Sigmoid colon diverticular disease without acute inflammation 4. Bibasilar  subpleural fibrosis Electronically Signed   By: Donavan Foil M.D.   On: 09/16/2016 17:19     Scheduled Meds: . memantine  28 mg Oral QHS   And  . donepezil  10 mg Oral QHS  . enoxaparin (LOVENOX) injection  40 mg Subcutaneous Q24H  . feeding supplement (ENSURE ENLIVE)  237 mL Oral BID BM  . insulin aspart  0-9 Units Subcutaneous TID WC  . predniSONE  40 mg Oral QAC breakfast  . sodium chloride flush  3 mL Intravenous Q12H  . tamsulosin  0.4 mg Oral QPC supper   Continuous Infusions: . cefTRIAXone (ROCEPHIN)  IV Stopped (09/17/16 1720)     Marzetta Board, MD, PhD [facility] Pager [phone]  If 7PM-7AM, please contact night-coverage [email or web address] Password [facility] 09/18/2016, 12:44 PM

## 2016-09-19 ENCOUNTER — Inpatient Hospital Stay (HOSPITAL_COMMUNITY): Payer: Medicare Other

## 2016-09-19 LAB — GLUCOSE, CAPILLARY
Glucose-Capillary: 117 mg/dL — ABNORMAL HIGH (ref 65–99)
Glucose-Capillary: 116 mg/dL — ABNORMAL HIGH (ref 65–99)
Glucose-Capillary: 128 mg/dL — ABNORMAL HIGH (ref 65–99)
Glucose-Capillary: 152 mg/dL — ABNORMAL HIGH (ref 65–99)

## 2016-09-19 NOTE — Progress Notes (Signed)
Ricky Boyd. Brigitte Pulse, RN

## 2016-09-19 NOTE — Progress Notes (Signed)
RN responded to patients bed alarm. At doorway entrance to room RN heard loud thud and saw patient descending to ground. From what was observed patient fell on buttocks/hip catching his upper body with his arms/hands and did not hit head (patient also denies hitting head), skin tear noted on Right ring finger. Patient alert to self, which is baseline per RN. Neuro assessment performed by patients RN- Katharine Look. Vital signs stable. MD - Dr. Cruzita Lederer notified. NT from ED sent to assess patient, patient placed on backboard and safely transferred to bed by 5 staff members,  and taken to x-ray for spine/hip. Fall huddle performed in the room and family (son- Lawayne) notified.  Barbee Shropshire. Brigitte Pulse, RN

## 2016-09-19 NOTE — Progress Notes (Signed)
09/19/16 1520  What Happened  Was fall witnessed? Yes  Who witnessed fall? Warrick Parisian, RN  Patients activity before fall ambulating-assisted  Point of contact buttocks;other (comment) (caught himself with hands)  Was patient injured? No  Follow Up  MD notified G[place]  Time MD notified [place]  Family notified Yes-comment  Time family notified 1535  Additional tests Yes-comment  Progress note created (see row info) Yes  Adult Fall Risk Assessment  Risk Factor Category (scoring not indicated) Fall has occurred during this admission (document High fall risk)  Patient's Fall Risk High Fall Risk (>13 points)  Adult Fall Risk Interventions  Required Bundle Interventions *See Row Information* High fall risk - low, moderate, and high requirements implemented  Additional Interventions Safety Sitter/Safety Rounder;Room near nurses station;Family Supervision  Screening for Fall Injury Risk  Risk For Fall Injury- See Row Information  Bones - fracture risk  Required Injury Bundle Interventions *See Row Information* Injury Bundle Implemented  Screening for Fall Injury Risk Interventions  Additional Interventions Safety sitter / Safety rounder  Vitals  Temp 99.7 F (37.6 C)  Temp Source Oral  BP 131/68  BP Location Left Arm  BP Method Automatic  Patient Position (if appropriate) Lying  Pulse Rate 67  Pulse Rate Source Dinamap  Resp 18  Oxygen Therapy  SpO2 99 %  O2 Device Room Air  Pain Assessment  Pain Assessment 0-10  Pain Score 3  Pain Type Acute pain  Pain Location Buttocks  Pain Orientation Mid  Pain Descriptors / Indicators Aching  Patients Stated Pain Goal 3  Multiple Pain Sites No  Neurological  Neuro (WDL) X  Level of Consciousness Alert  Orientation Level Oriented to person;Disoriented to time;Disoriented to situation;Disoriented to place  Cognition Appropriate at baseline;Impulsive;Poor attention/concentration;Poor judgement;Poor safety awareness;Follows commands   Speech Clear  Pupil Assessment  Yes  R Pupil Size (mm) 2  R Pupil Shape Round  R Pupil Reaction Brisk  L Pupil Size (mm) 2  L Pupil Shape Round  L Pupil Reaction Brisk  Additional Pupil Assessments No  Motor Function/Sensation Assessment Grip  Facial Symmetry Symmetrical  R Hand Grip Present  L Hand Grip Present  R Foot Dorsiflexion Present  L Foot Dorsiflexion Present  Neuro Symptoms Forgetful  Musculoskeletal  Musculoskeletal (WDL) X  Generalized Weakness Yes  Integumentary  Integumentary (WDL) X  Skin Integrity Skin tear  Skin Tear Location Finger (Comment which one) (Right)  Skin Tear Location Orientation Right  Skin Tear Intervention Cleansed  Pain Screening  Clinical Progression Not changed  Katrese Shell M. Brigitte Pulse, RN

## 2016-09-19 NOTE — Progress Notes (Signed)
PROGRESS NOTE  Ricky Boyd ZMO:[id number] DOB: 1940/11/11 DOA: 09/16/2016 PCP: Lucille Passy, MD   LOS: 3 days   Brief Narrative / Interim history: Ricky Boyd is a 76 y.o. 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  Assessment & Plan: Principal Problem:   Acute encephalopathy Active Problems:   GOUT   Amnestic MCI (mild cognitive impairment with memory loss)   Malignant neoplasm of prostate (HCC)   Dementia   Acute lower UTI   Fever   Acute urinary retention   Acute metabolic encephalopathy due to catheter associated urinary tract infection -Likely in the setting of a urinary tract infection -In the status improved on 7/27 -Continue IV ceftriaxone, urine cultures are pending, preliminary today with Escherichia coli as well as Klebsiella -Has intermittent agitation overnight, has an e-sitter in the room.   Acute urinary retention -Foley catheter placed 7/26, will likely need to leave the hospital with a Foley catheter and have outpatient follow-up with urology  Prostate cancer -Currently undergoing radiation therapy as an outpatient  Diarrhea -Likely related to proctitis as it appeared temporally related to radiation therapy.  Dementia -Continue to monitor mental status,  Acute kidney injury -Likely in the setting of dehydration, creatinine has now normalized with IV fluids  Probable gouty attack -Received Solu-Medrol on admission, continue prednisone, redness and tenderness seem to be improving today   DVT prophylaxis: Lovenox Code Status: DNR Family Communication: son bedside Disposition Plan: SNF 1-2 days  Consultants:   None   Procedures:   None   Antimicrobials:  Ceftriaxone 7/25 >>    Subjective: -Alert, no complaints, denies any pain, no chest pain, no shortness of breath, no abdominal pain nausea or vomiting  Objective: Vitals:   09/17/16 2136 09/18/16 0603 09/18/16 2041 09/19/16 0636  BP: 122/72 120/70 120/71 123/72  Pulse: 71 69 64 63  Resp: 20 18 18 18   Temp: 97.8 F (36.6 C) 98.2 F (36.8 C) 98.4 F (36.9 C) 97.8 F (36.6 C)  TempSrc: Oral Oral Oral Oral  SpO2: 99% 96% 98% 94%  Weight:  89.4 kg (197 lb 1.5 oz)  85.6 kg (188 lb 11.4 oz)  Height:        Intake/Output Summary (Last 24 hours) at 09/19/16 1122 Last data filed at 09/19/16 1001  Gross per 24 hour  Intake              650 ml  Output             3100 ml  Net            -2450 ml   Filed Weights   09/17/16 0700 09/18/16 0603 09/19/16 0636  Weight: 87.5 kg (192 lb 14.4 oz) 89.4 kg (197 lb 1.5 oz) 85.6 kg (188 lb 11.4 oz)    Examination:  Vitals:   09/17/16 2136 09/18/16 0603 09/18/16 2041 09/19/16 0636  BP: 122/72 120/70 120/71 123/72  Pulse: 71 69 64 63  Resp: 20 18 18 18   Temp: 97.8 F (36.6 C) 98.2 F (36.8 C) 98.4 F (36.9 C) 97.8 F (36.6 C)  TempSrc: Oral Oral Oral Oral  SpO2: 99% 96% 98% 94%  Weight:  89.4 kg (197 lb 1.5 oz)  85.6 kg (188 lb 11.4 oz)  Height:  Constitutional: NAD Respiratory: CTA Cardiovascular: RRR  Data Reviewed: I have independently reviewed following labs and imaging studies  CBC:  Recent Labs Lab 09/12/16 1328 09/12/16 1356 09/16/16 1359 09/16/16 2118 09/17/16 0450  WBC 6.4  --  4.6  --  4.2  NEUTROABS 5.2  --  3.8  --   --   HGB 11.2* 10.9* 11.5*  --  10.6*  HCT 32.2* 32.0* 32.6* 32.1* 29.4*  MCV 91.2  --  91.6  --  88.6  PLT 140*  --  159  --  277*   Basic Metabolic Panel:  Recent Labs Lab 09/12/16 1328 09/12/16 1356 09/16/16 1359 09/16/16 2100 09/17/16 0450  NA 136 139 139  --  138  K 3.5 3.6 3.6  --  3.8  CL 102 101 100*  --  104  CO2 24  --  29  --  24  GLUCOSE 104* 100* 115*  --  163*  BUN 21* 23* 14  --   14  CREATININE 1.74* 1.70* 1.09  --  0.97  CALCIUM 8.9  --  8.7*  --  8.2*  MG  --   --   --  1.6*  --    GFR: Estimated Creatinine Clearance: 68.1 mL/min (by C-G formula based on SCr of 0.97 mg/dL). Liver Function Tests:  Recent Labs Lab 09/12/16 1328 09/16/16 1359  AST 49* 78*  ALT 27 58  ALKPHOS 44 48  BILITOT 1.3* 0.9  PROT 6.8 7.2  ALBUMIN 3.7 3.1*   No results for input(s): LIPASE, AMYLASE in the last 168 hours.  Recent Labs Lab 09/16/16 2118  AMMONIA 13   Coagulation Profile:  Recent Labs Lab 09/16/16 1359  INR 1.08   Cardiac Enzymes: No results for input(s): CKTOTAL, CKMB, CKMBINDEX, TROPONINI in the last 168 hours. BNP (last 3 results) No results for input(s): PROBNP in the last 8760 hours. HbA1C: No results for input(s): HGBA1C in the last 72 hours. CBG:  Recent Labs Lab 09/18/16 0728 09/18/16 1251 09/18/16 1714 09/18/16 2126 09/19/16 0746  GLUCAP 93 133* 143* 145* 117*   Lipid Profile: No results for input(s): CHOL, HDL, LDLCALC, TRIG, CHOLHDL, LDLDIRECT in the last 72 hours. Thyroid Function Tests:  Recent Labs  09/16/16 2253  TSH 2.186   Anemia Panel:  Recent Labs  09/16/16 2118  VITAMINB12 283   Urine analysis:    Component Value Date/Time   COLORURINE YELLOW 09/16/2016 1400   APPEARANCEUR HAZY (A) 09/16/2016 1400   LABSPEC 1.017 09/16/2016 1400   PHURINE 5.0 09/16/2016 1400   GLUCOSEU NEGATIVE 09/16/2016 1400   HGBUR MODERATE (A) 09/16/2016 1400   BILIRUBINUR NEGATIVE 09/16/2016 1400   KETONESUR 80 (A) 09/16/2016 1400   PROTEINUR 100 (A) 09/16/2016 1400   UROBILINOGEN 0.2 07/21/2013 1119   NITRITE POSITIVE (A) 09/16/2016 1400   LEUKOCYTESUR LARGE (A) 09/16/2016 1400   Sepsis Labs: Invalid input(s): PROCALCITONIN, LACTICIDVEN  Recent Results (from the past 240 hour(s))  Urine culture     Status: None   Collection Time: 09/12/16  3:06 PM  Result Value Ref Range Status   Specimen Description URINE, CATHETERIZED   Final   Special Requests NONE  Final   Culture NO GROWTH  Final   Report Status 09/13/2016 FINAL  Final  Culture, blood (Routine x 2)     Status: None (Preliminary result)   Collection Time: 09/16/16  1:59 PM  Result Value Ref Range Status   Specimen Description RIGHT ANTECUBITAL  Final   Special Requests  Final    BOTTLES DRAWN AEROBIC AND ANAEROBIC Blood Culture adequate volume   Culture   Final    NO GROWTH 2 DAYS Performed at [facility], [place] [address], [place], [place] [address]    Report Status PENDING  Incomplete  Urine culture     Status: Abnormal (Preliminary result)   Collection Time: 09/16/16  2:00 PM  Result Value Ref Range Status   Specimen Description URINE, CATHETERIZED  Final   Special Requests Normal  Final   Culture (A)  Final    >=100,000 COLONIES/mL KLEBSIELLA PNEUMONIAE >=100,000 COLONIES/mL ESCHERICHIA COLI    Report Status PENDING  Incomplete  Culture, blood (Routine x 2)     Status: None (Preliminary result)   Collection Time: 09/16/16  9:18 PM  Result Value Ref Range Status   Specimen Description BLOOD BLOOD RIGHT HAND  Final   Special Requests IN PEDIATRIC BOTTLE Blood Culture adequate volume  Final   Culture   Final    NO GROWTH 1 DAY Performed at [facility], [place] [address], [place], [place] [address]    Report Status PENDING  Incomplete      Radiology Studies: No results found.   Scheduled Meds: . memantine  28 mg Oral QHS   And  . donepezil  10 mg Oral QHS  . enoxaparin (LOVENOX) injection  40 mg Subcutaneous Q24H  . feeding supplement (ENSURE ENLIVE)  237 mL Oral BID BM  . insulin aspart  0-9 Units Subcutaneous TID WC  . predniSONE  40 mg Oral QAC breakfast  . sodium chloride flush  3 mL Intravenous Q12H  . tamsulosin  0.4 mg Oral QPC supper   Continuous Infusions: . cefTRIAXone (ROCEPHIN)  IV Stopped (09/18/16 1916)     Marzetta Board, MD, PhD [facility] Pager [phone]  If 7PM-7AM, please contact  night-coverage [email or web address] Password [email or web address] 09/19/2016, 11:22 AM

## 2016-09-20 ENCOUNTER — Inpatient Hospital Stay (HOSPITAL_COMMUNITY): Payer: Medicare Other

## 2016-09-20 DIAGNOSIS — W19XXXA Unspecified fall, initial encounter: Secondary | ICD-10-CM

## 2016-09-20 LAB — URINE CULTURE
Culture: >=100000 — AB
SPECIAL REQUESTS: NORMAL

## 2016-09-20 LAB — GLUCOSE, CAPILLARY
GLUCOSE-CAPILLARY: 97 mg/dL (ref 65–99)
GLUCOSE-CAPILLARY: 119 mg/dL — AB (ref 65–99)
Glucose-Capillary: 158 mg/dL — ABNORMAL HIGH (ref 65–99)
Glucose-Capillary: 154 mg/dL — ABNORMAL HIGH (ref 65–99)

## 2016-09-20 MED ORDER — COLCHICINE 0.6 MG PO TABS: 0.6000 mg | ORAL_TABLET | Freq: Every day | ORAL | 2 refills | Status: DC

## 2016-09-20 MED ORDER — COLCHICINE 0.6 MG PO TABS
0.6000 mg | ORAL_TABLET | Freq: Every day | ORAL | Status: DC
  Administered 2016-09-20 – 2016-09-22 (×3): 0.6 mg via ORAL
  Filled 2016-09-20 (×3): qty 1

## 2016-09-20 MED ORDER — CIPROFLOXACIN HCL 500 MG PO TABS: 500.0000 mg | ORAL_TABLET | Freq: Two times a day (BID) | ORAL | Status: DC

## 2016-09-20 MED ORDER — CIPROFLOXACIN HCL 500 MG PO TABS
500.0000 mg | ORAL_TABLET | Freq: Two times a day (BID) | ORAL | Status: DC
  Administered 2016-09-20 – 2016-09-21 (×3): 500 mg via ORAL
  Filled 2016-09-20 (×3): qty 1

## 2016-09-20 NOTE — Progress Notes (Signed)
PROGRESS NOTE  Ricky Boyd PPI:[id number] DOB: [birth year] DOA: 09/16/2016 PCP: Lucille Passy, MD   LOS: 4 days   Brief Narrative / Interim history: Ricky Boyd is a 76 y.o. 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  Assessment & Plan: Principal Problem:   Acute encephalopathy Active Problems:   GOUT   Amnestic MCI (mild cognitive impairment with memory loss)   Malignant neoplasm of prostate (HCC)   Dementia   Acute lower UTI   Fever   Acute urinary retention   Acute metabolic encephalopathy due to catheter associated urinary tract infection -Likely in the setting of a urinary tract infection -mental status now improving, however still intermittently impulsive, likely due to hospital delirium -Urine cultures resulted today with Escherichia coli and Klebsiella both sensitive to ciprofloxacin. He is allergic to penicillins  Acute urinary retention -Foley catheter placed 7/26 -With UTI treated now with give a voiding trial today. If fails, will need to leave the hospital with a Foley and have follow-up with urology  Prostate cancer -Currently undergoing radiation therapy as an outpatient  Diarrhea -Likely related to proctitis as it appeared temporally related to radiation therapy.  Dementia -Continue to monitor mental status,  Acute kidney injury -Likely in the setting of dehydration, creatinine has now normalized with IV fluids  Probable gouty attack -Received Solu-Medrol on admission, continue prednisone -Added colchicine today since he has persistent pain to his knees. Knee x-ray negative.  Fall -Patient had a fall last night, Lane x-ray was negative for hip fracture. Fracture, however on today's exam he had  significant right hip pain, underwent a CT scan which fortunately is negative for acute fracture   DVT prophylaxis: Lovenox Code Status: DNR Family Communication: daughter bedside Disposition Plan: SNF 1-2 days  Consultants:   None   Procedures:   None   Antimicrobials:  Ceftriaxone 7/25 >> 7/29  Ciprofloxacin 7/29 >>  Subjective: -confused, alert, denies any chest pain, shortness of breath, no abdominal pain nausea or vomiting. Does not complain of much pain in his toes or knees unless he moves   Objective: Vitals:   09/19/16 1755 09/19/16 2036 09/20/16 0500 09/20/16 1200  BP: 126/74 127/80 124/67 128/75  Pulse: 67 65 61 71  Resp: 18 16 16 16   Temp: (!) 97.5 F (36.4 C) 98.2 F (36.8 C) 98.2 F (36.8 C) 98 F (36.7 C)  TempSrc: Oral Oral Oral Oral  SpO2: 99% 99% 96% 100%  Weight:   86.5 kg (190 lb 11.2 oz)   Height:        Intake/Output Summary (Last 24 hours) at 09/20/16 1436 Last data filed at 09/20/16 1041  Gross per 24 hour  Intake              120 ml  Output             2350 ml  Net            -2230 ml   Filed Weights   09/18/16 0603 09/19/16 0636 09/20/16 0500  Weight: 89.4 kg (197 lb 1.5 oz) 85.6 kg (188 lb 11.4 oz) 86.5 kg (190 lb 11.2 oz)    Examination:  Vitals:   09/19/16 1755 09/19/16 2036 09/20/16 0500 09/20/16 1200  BP: 126/74 127/80 124/67 128/75  Pulse: 67  65 61 71  Resp: 18 16 16 16   Temp: (!) 97.5 F (36.4 C) 98.2 F (36.8 C) 98.2 F (36.8 C) 98 F (36.7 C)  TempSrc: Oral Oral Oral Oral  SpO2: 99% 99% 96% 100%  Weight:   86.5 kg (190 lb 11.2 oz)   Height:       Constitutional: NAD Eyes: lids and conjunctivae normal Respiratory: clear to auscultation bilaterally, no wheezing, no crackles. Normal respiratory effort.  Cardiovascular: Regular rate and rhythm, no murmurs / rubs / gallops. No LE edema. 2+ pedal pulses.  Abdomen: no tenderness. Bowel sounds positive.  MSK: hip pain with leg movement on right side Neurologic: non  focal  Data Reviewed: I have independently reviewed following labs and imaging studies  CBC:  Recent Labs Lab 09/16/16 1359 09/16/16 2118 09/17/16 0450  WBC 4.6  --  4.2  NEUTROABS 3.8  --   --   HGB 11.5*  --  10.6*  HCT 32.6* 32.1* 29.4*  MCV 91.6  --  88.6  PLT 159  --  829*   Basic Metabolic Panel:  Recent Labs Lab 09/16/16 1359 09/16/16 2100 09/17/16 0450  NA 139  --  138  K 3.6  --  3.8  CL 100*  --  104  CO2 29  --  24  GLUCOSE 115*  --  163*  BUN 14  --  14  CREATININE 1.09  --  0.97  CALCIUM 8.7*  --  8.2*  MG  --  1.6*  --    GFR: Estimated Creatinine Clearance: 68.4 mL/min (by C-G formula based on SCr of 0.97 mg/dL). Liver Function Tests:  Recent Labs Lab 09/16/16 1359  AST 78*  ALT 58  ALKPHOS 48  BILITOT 0.9  PROT 7.2  ALBUMIN 3.1*   No results for input(s): LIPASE, AMYLASE in the last 168 hours.  Recent Labs Lab 09/16/16 2118  AMMONIA 13   Coagulation Profile:  Recent Labs Lab 09/16/16 1359  INR 1.08   Cardiac Enzymes: No results for input(s): CKTOTAL, CKMB, CKMBINDEX, TROPONINI in the last 168 hours. BNP (last 3 results) No results for input(s): PROBNP in the last 8760 hours. HbA1C: No results for input(s): HGBA1C in the last 72 hours. CBG:  Recent Labs Lab 09/19/16 1209 09/19/16 1726 09/19/16 2040 09/20/16 0807 09/20/16 1207  GLUCAP 116* 128* 152* 97 119*   Lipid Profile: No results for input(s): CHOL, HDL, LDLCALC, TRIG, CHOLHDL, LDLDIRECT in the last 72 hours. Thyroid Function Tests: No results for input(s): TSH, T4TOTAL, FREET4, T3FREE, THYROIDAB in the last 72 hours. Anemia Panel: No results for input(s): VITAMINB12, FOLATE, FERRITIN, TIBC, IRON, RETICCTPCT in the last 72 hours. Urine analysis:    Component Value Date/Time   COLORURINE YELLOW 09/16/2016 1400   APPEARANCEUR HAZY (A) 09/16/2016 1400   LABSPEC 1.017 09/16/2016 1400   PHURINE 5.0 09/16/2016 1400   GLUCOSEU NEGATIVE 09/16/2016 1400   HGBUR  MODERATE (A) 09/16/2016 1400   BILIRUBINUR NEGATIVE 09/16/2016 1400   KETONESUR 80 (A) 09/16/2016 1400   PROTEINUR 100 (A) 09/16/2016 1400   UROBILINOGEN 0.2 07/21/2013 1119   NITRITE POSITIVE (A) 09/16/2016 1400   LEUKOCYTESUR LARGE (A) 09/16/2016 1400   Sepsis Labs: Invalid input(s): PROCALCITONIN, LACTICIDVEN  Recent Results (from the past 240 hour(s))  Urine culture     Status: None   Collection Time: 09/12/16  3:06 PM  Result Value Ref Range Status   Specimen Description URINE, CATHETERIZED  Final   Special Requests NONE  Final  Culture NO GROWTH  Final   Report Status 09/13/2016 FINAL  Final  Culture, blood (Routine x 2)     Status: None (Preliminary result)   Collection Time: 09/16/16  1:59 PM  Result Value Ref Range Status   Specimen Description RIGHT ANTECUBITAL  Final   Special Requests   Final    BOTTLES DRAWN AEROBIC AND ANAEROBIC Blood Culture adequate volume   Culture   Final    NO GROWTH 3 DAYS Performed at [facility], [address], [place], [place] [address]    Report Status PENDING  Incomplete  Urine culture     Status: Abnormal   Collection Time: 09/16/16  2:00 PM  Result Value Ref Range Status   Specimen Description URINE, CATHETERIZED  Final   Special Requests Normal  Final   Culture (A)  Final    >=100,000 COLONIES/mL KLEBSIELLA PNEUMONIAE >=100,000 COLONIES/mL ESCHERICHIA COLI    Report Status 09/20/2016 FINAL  Final   Organism ID, Bacteria KLEBSIELLA PNEUMONIAE (A)  Final   Organism ID, Bacteria ESCHERICHIA COLI (A)  Final      Susceptibility   Escherichia coli - MIC*    AMPICILLIN <=2 SENSITIVE Sensitive     CEFAZOLIN <=4 SENSITIVE Sensitive     CEFTRIAXONE <=1 SENSITIVE Sensitive     CIPROFLOXACIN <=0.25 SENSITIVE Sensitive     GENTAMICIN <=1 SENSITIVE Sensitive     IMIPENEM <=0.25 SENSITIVE Sensitive     NITROFURANTOIN <=16 SENSITIVE Sensitive     TRIMETH/SULFA <=20 SENSITIVE Sensitive     AMPICILLIN/SULBACTAM <=2 SENSITIVE  Sensitive     PIP/TAZO <=4 SENSITIVE Sensitive     Extended ESBL NEGATIVE Sensitive     * >=100,000 COLONIES/mL ESCHERICHIA COLI   Klebsiella pneumoniae - MIC*    AMPICILLIN >=32 RESISTANT Resistant     CEFAZOLIN <=4 SENSITIVE Sensitive     CEFTRIAXONE <=1 SENSITIVE Sensitive     CIPROFLOXACIN <=0.25 SENSITIVE Sensitive     GENTAMICIN <=1 SENSITIVE Sensitive     IMIPENEM <=0.25 SENSITIVE Sensitive     NITROFURANTOIN 64 INTERMEDIATE Intermediate     TRIMETH/SULFA <=20 SENSITIVE Sensitive     AMPICILLIN/SULBACTAM 8 SENSITIVE Sensitive     PIP/TAZO <=4 SENSITIVE Sensitive     Extended ESBL NEGATIVE Sensitive     * >=100,000 COLONIES/mL KLEBSIELLA PNEUMONIAE  Culture, blood (Routine x 2)     Status: None (Preliminary result)   Collection Time: 09/16/16  9:18 PM  Result Value Ref Range Status   Specimen Description BLOOD BLOOD RIGHT HAND  Final   Special Requests IN PEDIATRIC BOTTLE Blood Culture adequate volume  Final   Culture   Final    NO GROWTH 2 DAYS Performed at [facility], [place] [address], [place], [place] [address]    Report Status PENDING  Incomplete      Radiology Studies: Ct Hip Right Wo Contrast  Result Date: 09/20/2016 CLINICAL DATA:  Severe right hip pain after falling yesterday. Dementia and urosepsis. EXAM: CT OF THE RIGHT HIP WITHOUT CONTRAST TECHNIQUE: Multidetector CT imaging of the right hip was performed according to the standard protocol. Multiplanar CT image reconstructions were also generated. COMPARISON:  Bilateral hip radiographs 09/19/2016. Pelvic CT 09/16/2016. FINDINGS: Bones/Joint/Cartilage Examination is limited to the right hip and inferior right hemipelvis. There is no evidence of acute fracture or dislocation. There are minimal degenerative changes at the right hip. 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 No significant hip joint effusion. Ligaments Not relevant for exam/indication. Muscles and  Tendons The right hip periarticular  musculature and tendons appear unremarkable. Soft tissues Mild subcutaneous edema lateral to the right hip. No focal hematoma. Right inguinal hernia containing only fat appears unchanged. 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 The bladder is thick walled with small diverticula. There is a small amount of air within the bladder lumen, likely iatrogenic. Sigmoid diverticular changes are present. IMPRESSION: 1. No evidence of acute right hip fracture or dislocation. 2. No periarticular hematoma.  Mild subcutaneous edema laterally. 3. Air in the bladder lumen, presumed iatrogenic. Correlate clinically. Underlying bladder Varon thickening and diverticula again noted. Electronically Signed   By: Richardean Sale M.D.   On: 09/20/2016 11:28   Dg Knee Complete 4 Views Right  Result Date: 09/20/2016 CLINICAL DATA:  Right knee pain, fall. EXAM: RIGHT KNEE - COMPLETE 4+ VIEW COMPARISON:  None FINDINGS: Early spurring. No acute bony abnormality. Specifically, no fracture, subluxation, or dislocation. Soft tissues are intact. Trace joint effusion. IMPRESSION: No acute bony abnormality. Electronically Signed   By: Rolm Baptise M.D.   On: 09/20/2016 11:27   Dg Hips Bilat With Pelvis 3-4 Views  Result Date: 09/19/2016 CLINICAL DATA:  Fall.  History of dementia.  Limited movement. EXAM: DG HIP (WITH OR WITHOUT PELVIS) 3-4V BILAT COMPARISON:  None. FINDINGS: Degenerative changes demonstrated in the lower lumbar spine and in both hips. No evidence of acute fracture or dislocation of the pelvis or hips. SI joints and symphysis pubis are not displaced. Visualized sacrum appears intact. Surgical clips over the prostate region. IMPRESSION: Degenerative changes in the lower lumbar spine and hips. No acute displaced fractures identified. Electronically Signed   By: Lucienne Capers M.D.   On: 09/19/2016 19:17     Scheduled Meds: . ciprofloxacin  500 mg Oral BID  . colchicine  0.6 mg Oral Daily  . memantine   28 mg Oral QHS   And  . donepezil  10 mg Oral QHS  . enoxaparin (LOVENOX) injection  40 mg Subcutaneous Q24H  . feeding supplement (ENSURE ENLIVE)  237 mL Oral BID BM  . insulin aspart  0-9 Units Subcutaneous TID WC  . predniSONE  40 mg Oral QAC breakfast  . sodium chloride flush  3 mL Intravenous Q12H  . tamsulosin  0.4 mg Oral QPC supper   Continuous Infusions:    Marzetta Board, MD, PhD [facility] Pager [phone]  If 7PM-7AM, please contact night-coverage [email or web address] Password [facility] 09/20/2016, 2:36 PM

## 2016-09-21 ENCOUNTER — Ambulatory Visit: Payer: Medicare Other

## 2016-09-21 LAB — CULTURE, BLOOD (ROUTINE X 2)
Culture: NO GROWTH
Special Requests: ADEQUATE

## 2016-09-21 LAB — GLUCOSE, CAPILLARY
GLUCOSE-CAPILLARY: 113 mg/dL — AB (ref 65–99)
GLUCOSE-CAPILLARY: 151 mg/dL — AB (ref 65–99)
GLUCOSE-CAPILLARY: 136 mg/dL — AB (ref 65–99)
Glucose-Capillary: 139 mg/dL — ABNORMAL HIGH (ref 65–99)

## 2016-09-21 MED ORDER — QUETIAPINE FUMARATE 25 MG PO TABS
12.5000 mg | ORAL_TABLET | Freq: Every day | ORAL | Status: DC
  Administered 2016-09-21: 12.5 mg via ORAL
  Filled 2016-09-21: qty 1

## 2016-09-21 MED ORDER — HYDRALAZINE HCL 10 MG PO TABS
10.0000 mg | ORAL_TABLET | Freq: Once | ORAL | Status: AC
  Administered 2016-09-21: 10 mg via ORAL
  Filled 2016-09-21: qty 1

## 2016-09-21 MED ORDER — CEPHALEXIN 500 MG PO CAPS
500.0000 mg | ORAL_CAPSULE | Freq: Three times a day (TID) | ORAL | Status: DC
  Administered 2016-09-21 – 2016-09-22 (×4): 500 mg via ORAL
  Filled 2016-09-21 (×5): qty 1

## 2016-09-21 NOTE — Progress Notes (Signed)
Physical Therapy Treatment Patient Details Name: Ricky Boyd MRN: [id number] DOB: 14-Feb-1941 Today's Date: 09/21/2016    History of Present Illness Pt is a 76 y.o. male with PMHx of mild dementia, history of prostate cancer undergoing radiation treatment, and history of gout who presented to the ED with confusions and hallucinations over the past 1-2 days. Pt admitted for acute encephalopathy likely caused by acute UTI.     PT Comments    Pt sleepy and difficult to arouse.  Spouse present.  Pt required increased time to assist OOB to amb.  Only following one step commands and required increased time to process and deliver.  Pt c/o R foot pain when applying footies.  Pt c/o R knee pain with bed mobility.  Assisted upright EOB, pt able to static sit x 5 min unsupported.  Assisted with standing from elevated bed immediately noted posterior LOB with poor self correction.  Assisted with amb using RW required 100% VC's on proper use and safety.  Pt was too unsteady to attempt gait without.  Recliner following.  Pt tolerated an increased distance however VERY UNSTEADY gait with R drift and posterior LOB x 2 showing no self correction/awareness.  HIGH FALL RISK.  Prior pt was amb Indep (no AD) and playing corn hole.   Pt will need ST Rehab at SNF before safely returning home with spouse    Follow Up Recommendations  SNF     Equipment Recommendations  None recommended by PT    Recommendations for Other Services       Precautions / Restrictions Precautions Precautions: Fall Precaution Comments: cognitive impaired Hx dementia Restrictions Weight Bearing Restrictions: No    Mobility  Bed Mobility Overal bed mobility: Needs Assistance       Supine to sit: HOB elevated;Mod assist Sit to supine: Mod assist   General bed mobility comments: MAX encouragement and increased time to complete task.    Transfers Overall transfer level: Needs assistance Equipment used: Rolling walker (2  wheeled) Transfers: Sit to/from Stand Sit to Stand: Mod assist;From elevated surface;+2 physical assistance;Min assist         General transfer comment: 100% simple VC's and increased time.  Pt able to rise from elevated bed.  Noted initial posterior LOB therapist corrected.  Pt unaware.    Ambulation/Gait Ambulation/Gait assistance: Min assist;+2 physical assistance;+2 safety/equipment Ambulation Distance (Feet): 140 Feet Assistive device: Rolling walker (2 wheeled) Gait Pattern/deviations: Step-through pattern;Step-to pattern;Decreased stride length;Trunk flexed;Shuffle Gait velocity: decreased   General Gait Details: Assist to stabilize pt and maneuver safely with a RW.  Pt had never used a walker before.  Recliner following for safety.  100% VC's on safety with turns and proper walker to self distance.  HIGH FALL RISK.  Delayed corrective reactiion and poor midline awareness.    Stairs            Wheelchair Mobility    Modified Rankin (Stroke Patients Only)       Balance                                            Cognition Arousal/Alertness: Awake/alert Behavior During Therapy: WFL for tasks assessed/performed Overall Cognitive Status: Impaired/Different from baseline Area of Impairment: Following commands;Awareness;Problem solving                       Following Commands: Follows  one step commands inconsistently     Problem Solving: Slow processing;Decreased initiation;Difficulty sequencing;Requires verbal cues;Requires tactile cues General Comments: difficulty with following directional commands:  multimodal cues needed at times.  Extra time to initiate      Exercises      General Comments        Pertinent Vitals/Pain Pain Assessment: Faces Faces Pain Scale: Hurts even more Pain Location: R knee Pain Descriptors / Indicators: Grimacing Pain Intervention(s): Monitored during session;Repositioned    Home Living                       Prior Function            PT Goals (current goals can now be found in the care plan section) Progress towards PT goals: Progressing toward goals    Frequency    Min 3X/week      PT Plan Current plan remains appropriate    Co-evaluation              AM-PAC PT "6 Clicks" Daily Activity  Outcome Measure  Difficulty turning over in bed (including adjusting bedclothes, sheets and blankets)?: Total Difficulty moving from lying on back to sitting on the side of the bed? : Total Difficulty sitting down on and standing up from a chair with arms (e.g., wheelchair, bedside commode, etc,.)?: Total Help needed moving to and from a bed to chair (including a wheelchair)?: Total Help needed walking in hospital room?: Total Help needed climbing 3-5 steps with a railing? : Total 6 Click Score: 6    End of Session Equipment Utilized During Treatment: Gait belt Activity Tolerance: Patient tolerated treatment well Patient left: in chair;with call bell/phone within reach;with chair alarm set Nurse Communication: Mobility status PT Visit Diagnosis: Muscle weakness (generalized) (M62.81);Difficulty in walking, not elsewhere classified (R26.2)     Time: 5631-4970 PT Time Calculation (min) (ACUTE ONLY): 32 min  Charges:  $Gait Training: 8-22 mins $Therapeutic Activity: 8-22 mins                    G Codes:       {Imari Reen  PTA [facility]  Acute  Rehab Pager      [phone]

## 2016-09-21 NOTE — Care Management Important Message (Deleted)
Important Message  Patient Details  Name: Ricky Boyd MRN: [id number] Date of Birth: 1940-06-14   Medicare Important Message Given:  Yes    Kerin Salen 09/21/2016, 11:13 AMImportant Message  Patient Details  Name: Ricky Boyd MRN: [id number] Date of Birth: 06-30-1940   Medicare Important Message Given:  Yes    Kerin Salen 09/21/2016, 11:13 AM

## 2016-09-21 NOTE — Progress Notes (Signed)
Bladder scan showing > 800 ml in bladder this AM. On-call notified who ordered I/O cath. 900 ml returned.

## 2016-09-21 NOTE — Progress Notes (Signed)
PROGRESS NOTE  [name] WRU:[id number] DOB: [birth year] DOA: 09/16/2016 PCP: Lucille Passy, MD   LOS: 5 days   Brief Narrative / Interim history: Ricky Boyd is a 76 y.o. 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  Assessment & Plan: Principal Problem:   Acute encephalopathy Active Problems:   GOUT   Amnestic MCI (mild cognitive impairment with memory loss)   Malignant neoplasm of prostate (HCC)   Dementia   Acute lower UTI   Fever   Acute urinary retention   Acute metabolic encephalopathy due to catheter associated urinary tract infection -Likely in the setting of a urinary tract infection -mental status improved, however still intermittently impulsive overnight, likely due to hospital delirium -Urine cultures resulted with Escherichia coli and Klebsiella, widely sensitive. On Keflex  Acute urinary retention -Foley catheter placed 7/26 -Status post voiding trial on 7/29, continues to have retention, keep Foley in and have outpatient follow-up with urology  Prostate cancer -Currently undergoing radiation therapy as an outpatient  Diarrhea -Likely related to proctitis as it appeared temporally related to radiation therapy.  Dementia -Continue to monitor mental status,  Acute kidney injury -Likely in the setting of dehydration, creatinine has now normalized with IV fluids  Probable gouty attack -Received Solu-Medrol on admission, continue prednisone -Added colchicine since he has persistent pain to his knees. Knee x-ray negative. -Pain improved his knees and toes today  Fall -Patient had a fall 7/28 evening. 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 No complaints, denies pain  Objective: Vitals:   09/20/16 2213 09/21/16 0327 09/21/16 0437 09/21/16 0537  BP: (!) 160/81 (!) 182/87 (!) 171/81 (!) 159/87  Pulse: 69 78  72  Resp:  20  17  Temp:  (!) 97.5 F (36.4 C)  (!) 97.5 F (36.4 C)  TempSrc:  Oral  Oral  SpO2:  98%    Weight:   84.4 kg (186 lb 1.1 oz)   Height:        Intake/Output Summary (Last 24 hours) at 09/21/16 1245 Last data filed at 09/21/16 1045  Gross per 24 hour  Intake              520 ml  Output             2075 ml  Net            -1555 ml   Filed Weights   09/19/16 0636 09/20/16 0500 09/21/16 0437  Weight: 85.6 kg (188 lb 11.4 oz) 86.5 kg (190 lb 11.2 oz) 84.4 kg (186 lb 1.1 oz)    Examination:  Vitals:   09/20/16 2213 09/21/16 0327 09/21/16 0437 09/21/16 0537  BP: (!) 160/81 (!) 182/87 (!) 171/81 (!) 159/87  Pulse: 69 78  72  Resp:  20  17  Temp:  (!) 97.5 F (36.4 C)  (!) 97.5 F (36.4 C)  TempSrc:  Oral  Oral  SpO2:  98%    Weight:   84.4 kg (  186 lb 1.1 oz)   Height:       Constitutional: NAD Respiratory: CTA biL Cardiovascular: RRR MSK: minimal pain right knee  Data Reviewed: I have independently reviewed following labs and imaging studies  CBC:  Recent Labs Lab 09/16/16 1359 09/16/16 2118 09/17/16 0450  WBC 4.6  --  4.2  NEUTROABS 3.8  --   --   HGB 11.5*  --  10.6*  HCT 32.6* 32.1* 29.4*  MCV 91.6  --  88.6  PLT 159  --  465*   Basic Metabolic Panel:  Recent Labs Lab 09/16/16 1359 09/16/16 2100 09/17/16 0450  NA 139  --  138  K 3.6  --  3.8  CL 100*  --  104  CO2 29  --  24  GLUCOSE 115*  --  163*  BUN 14  --  14  CREATININE 1.09  --  0.97  CALCIUM 8.7*  --  8.2*  MG  --  1.6*   --    GFR: Estimated Creatinine Clearance: 62.8 mL/min (by C-G formula based on SCr of 0.97 mg/dL). Liver Function Tests:  Recent Labs Lab 09/16/16 1359  AST 78*  ALT 58  ALKPHOS 48  BILITOT 0.9  PROT 7.2  ALBUMIN 3.1*   No results for input(s): LIPASE, AMYLASE in the last 168 hours.  Recent Labs Lab 09/16/16 2118  AMMONIA 13   Coagulation Profile:  Recent Labs Lab 09/16/16 1359  INR 1.08   Cardiac Enzymes: No results for input(s): CKTOTAL, CKMB, CKMBINDEX, TROPONINI in the last 168 hours. BNP (last 3 results) No results for input(s): PROBNP in the last 8760 hours. HbA1C: No results for input(s): HGBA1C in the last 72 hours. CBG:  Recent Labs Lab 09/20/16 1207 09/20/16 1715 09/20/16 2105 09/21/16 0735 09/21/16 1158  GLUCAP 119* 158* 154* 113* 151*   Lipid Profile: No results for input(s): CHOL, HDL, LDLCALC, TRIG, CHOLHDL, LDLDIRECT in the last 72 hours. Thyroid Function Tests: No results for input(s): TSH, T4TOTAL, FREET4, T3FREE, THYROIDAB in the last 72 hours. Anemia Panel: No results for input(s): VITAMINB12, FOLATE, FERRITIN, TIBC, IRON, RETICCTPCT in the last 72 hours. Urine analysis:    Component Value Date/Time   COLORURINE YELLOW 09/16/2016 1400   APPEARANCEUR HAZY (A) 09/16/2016 1400   LABSPEC 1.017 09/16/2016 1400   PHURINE 5.0 09/16/2016 1400   GLUCOSEU NEGATIVE 09/16/2016 1400   HGBUR MODERATE (A) 09/16/2016 1400   BILIRUBINUR NEGATIVE 09/16/2016 1400   KETONESUR 80 (A) 09/16/2016 1400   PROTEINUR 100 (A) 09/16/2016 1400   UROBILINOGEN 0.2 07/21/2013 1119   NITRITE POSITIVE (A) 09/16/2016 1400   LEUKOCYTESUR LARGE (A) 09/16/2016 1400   Sepsis Labs: Invalid input(s): PROCALCITONIN, LACTICIDVEN  Recent Results (from the past 240 hour(s))  Urine culture     Status: None   Collection Time: 09/12/16  3:06 PM  Result Value Ref Range Status   Specimen Description URINE, CATHETERIZED  Final   Special Requests NONE  Final   Culture NO  GROWTH  Final   Report Status 09/13/2016 FINAL  Final  Culture, blood (Routine x 2)     Status: None (Preliminary result)   Collection Time: 09/16/16  1:59 PM  Result Value Ref Range Status   Specimen Description RIGHT ANTECUBITAL  Final   Special Requests   Final    BOTTLES DRAWN AEROBIC AND ANAEROBIC Blood Culture adequate volume   Culture   Final    NO GROWTH 4 DAYS Performed at [facility], [address] [place], [place] [address]    Report Status PENDING  Incomplete  Urine culture     Status: Abnormal   Collection Time: 09/16/16  2:00 PM  Result Value Ref Range Status   Specimen Description URINE, CATHETERIZED  Final   Special Requests Normal  Final   Culture (A)  Final    >=100,000 COLONIES/mL KLEBSIELLA PNEUMONIAE >=100,000 COLONIES/mL ESCHERICHIA COLI    Report Status 09/20/2016 FINAL  Final   Organism ID, Bacteria KLEBSIELLA PNEUMONIAE (A)  Final   Organism ID, Bacteria ESCHERICHIA COLI (A)  Final      Susceptibility   Escherichia coli - MIC*    AMPICILLIN <=2 SENSITIVE Sensitive     CEFAZOLIN <=4 SENSITIVE Sensitive     CEFTRIAXONE <=1 SENSITIVE Sensitive     CIPROFLOXACIN <=0.25 SENSITIVE Sensitive     GENTAMICIN <=1 SENSITIVE Sensitive     IMIPENEM <=0.25 SENSITIVE Sensitive     NITROFURANTOIN <=16 SENSITIVE Sensitive     TRIMETH/SULFA <=20 SENSITIVE Sensitive     AMPICILLIN/SULBACTAM <=2 SENSITIVE Sensitive     PIP/TAZO <=4 SENSITIVE Sensitive     Extended ESBL NEGATIVE Sensitive     * >=100,000 COLONIES/mL ESCHERICHIA COLI   Klebsiella pneumoniae - MIC*    AMPICILLIN >=32 RESISTANT Resistant     CEFAZOLIN <=4 SENSITIVE Sensitive     CEFTRIAXONE <=1 SENSITIVE Sensitive     CIPROFLOXACIN <=0.25 SENSITIVE Sensitive     GENTAMICIN <=1 SENSITIVE Sensitive     IMIPENEM <=0.25 SENSITIVE Sensitive     NITROFURANTOIN 64 INTERMEDIATE Intermediate     TRIMETH/SULFA <=20 SENSITIVE Sensitive     AMPICILLIN/SULBACTAM 8 SENSITIVE Sensitive     PIP/TAZO <=4  SENSITIVE Sensitive     Extended ESBL NEGATIVE Sensitive     * >=100,000 COLONIES/mL KLEBSIELLA PNEUMONIAE  Culture, blood (Routine x 2)     Status: None (Preliminary result)   Collection Time: 09/16/16  9:18 PM  Result Value Ref Range Status   Specimen Description BLOOD BLOOD RIGHT HAND  Final   Special Requests IN PEDIATRIC BOTTLE Blood Culture adequate volume  Final   Culture   Final    NO GROWTH 3 DAYS Performed at [facility], [place] [address] [place], [place] [address]    Report Status PENDING  Incomplete      Radiology Studies: Ct Hip Right Wo Contrast  Result Date: 09/20/2016 CLINICAL DATA:  Severe right hip pain after falling yesterday. Dementia and urosepsis. EXAM: CT OF THE RIGHT HIP WITHOUT CONTRAST TECHNIQUE: Multidetector CT imaging of the right hip was performed according to the standard protocol. Multiplanar CT image reconstructions were also generated. COMPARISON:  Bilateral hip radiographs 09/19/2016. Pelvic CT 09/16/2016. FINDINGS: Bones/Joint/Cartilage Examination is limited to the right hip and inferior right hemipelvis. There is no evidence of acute fracture or dislocation. There are minimal degenerative changes at the right hip. 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 No significant hip joint effusion. Ligaments Not relevant for exam/indication. Muscles and Tendons The right hip periarticular musculature and tendons appear unremarkable. Soft tissues Mild subcutaneous edema lateral to the right hip. No focal hematoma. Right inguinal hernia containing only fat appears unchanged. 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 The bladder is thick walled with small diverticula. There is a small amount of air within the bladder lumen, likely iatrogenic. Sigmoid diverticular changes are present. IMPRESSION: 1. No evidence of acute right hip fracture or dislocation. 2. No periarticular hematoma.  Mild subcutaneous edema laterally. 3. Air in the  bladder lumen, presumed iatrogenic. Correlate clinically. Underlying bladder Nudd thickening and diverticula again noted. Electronically Signed   By: Richardean Sale M.D.   On: 09/20/2016 11:28   Dg Knee Complete 4 Views Right  Result Date: 09/20/2016 CLINICAL DATA:  Right knee pain, fall. EXAM: RIGHT KNEE - COMPLETE 4+ VIEW COMPARISON:  None FINDINGS: Early spurring. No acute bony abnormality. Specifically, no fracture, subluxation, or dislocation. Soft tissues are intact. Trace joint effusion. IMPRESSION: No acute bony abnormality. Electronically Signed   By: Rolm Baptise M.D.   On: 09/20/2016 11:27   Dg Hips Bilat With Pelvis 3-4 Views  Result Date: 09/19/2016 CLINICAL DATA:  Fall.  History of dementia.  Limited movement. EXAM: DG HIP (WITH OR WITHOUT PELVIS) 3-4V BILAT COMPARISON:  None. FINDINGS: Degenerative changes demonstrated in the lower lumbar spine and in both hips. No evidence of acute fracture or dislocation of the pelvis or hips. SI joints and symphysis pubis are not displaced. Visualized sacrum appears intact. Surgical clips over the prostate region. IMPRESSION: Degenerative changes in the lower lumbar spine and hips. No acute displaced fractures identified. Electronically Signed   By: Lucienne Capers M.D.   On: 09/19/2016 19:17     Scheduled Meds: . cephALEXin  500 mg Oral Q8H  . colchicine  0.6 mg Oral Daily  . memantine  28 mg Oral QHS   And  . donepezil  10 mg Oral QHS  . enoxaparin (LOVENOX) injection  40 mg Subcutaneous Q24H  . feeding supplement (ENSURE ENLIVE)  237 mL Oral BID BM  . insulin aspart  0-9 Units Subcutaneous TID WC  . sodium chloride flush  3 mL Intravenous Q12H  . tamsulosin  0.4 mg Oral QPC supper   Continuous Infusions:    Marzetta Board, MD, PhD [facility] Pager [phone]  If 7PM-7AM, please contact night-coverage [email or web address] Password [email or web address] 09/21/2016, 12:45 PM

## 2016-09-21 NOTE — Care Management Important Message (Signed)
Important Message  Patient Details  Name: Janos Shampine MRN: [id number] Date of Birth: [birth year]   Medicare Important Message Given:  Yes    Kerin Salen 09/21/2016, 11:02 AMImportant Message  Patient Details  Name: Jeffrie Lofstrom MRN: [id number] Date of Birth: [birth year]   Medicare Important Message Given:  Yes    Kerin Salen 09/21/2016, 11:02 AM

## 2016-09-22 LAB — GLUCOSE, CAPILLARY
GLUCOSE-CAPILLARY: 96 mg/dL (ref 65–99)
GLUCOSE-CAPILLARY: 138 mg/dL — AB (ref 65–99)

## 2016-09-22 LAB — CULTURE, BLOOD (ROUTINE X 2)
CULTURE: NO GROWTH
SPECIAL REQUESTS: ADEQUATE

## 2016-09-22 MED ORDER — QUETIAPINE FUMARATE 25 MG PO TABS: 12.5000 mg | ORAL_TABLET | Freq: Every day | ORAL | Status: DC

## 2016-09-22 MED ORDER — CEPHALEXIN 500 MG PO CAPS
500.0000 mg | ORAL_CAPSULE | Freq: Three times a day (TID) | ORAL | Status: DC
Start: 2016-09-22 — End: 2016-10-08

## 2016-09-22 NOTE — Discharge Summary (Signed)
Physician Discharge Summary  Terrin Meddaugh IWP:[id number] DOB: 11-25-1940 DOA: 09/16/2016  PCP: Lucille Passy, MD  Admit date: 09/16/2016 Discharge date: 09/22/2016  Admitted From: home Disposition:  SNF  Recommendations for Outpatient Follow-up:  1. Follow up with PCP in 1-2 weeks 2. Continue Keflex for 5 days 3. Continue colchicine 0.6 mg for 5 days 4. Keep Foley in. Follow up with radiation oncology for prostate cancer to finish radiation treatment and have a voiding trial in office  Discharge Condition: stable CODE STATUS: DNR Diet recommendation: regular  HPI: Per Dr. Grandville Silos, Ricky Boyd is a 76 y.o. 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 Patient had presented to the ED 3 days prior to admission and noted to have acute urinary retention Foley catheter was placed urinalysis at that time was unremarkable. Patient subsequently discharged home. Patient presenting back per son with worsening hallucinations over the past 1-2 days the patient is seen dead relatives, picking tomatoes andcorn out pf the sky. [name] who gives most of the history states patient did not have auditory hallucinations. [name] also states patient does endorse some chills, generalized weakness, bilateral foot and knee pain, chronic diarrhea felt to be secondary to radiation treatment with a little bit of blood noted in his stool which was maroon colored. Per son patient has not had any fevers, no nausea, no emesis, no chest pain, no shortness of breath, no abdominal pain, no constipation, no hematemesis, no melena, no hematochezia, no cough, no facial asymmetry, no slurred speech, no asymmetric weakness or numbness. ED Course: Patient seen in the  ED urinalysis with moderate hemoglobin, 80 ketones, large leukocytes, positive nitrites, too numerous to count WBCs, WBC clumps. CT stone protocol which was done showed thick walled appearing urinary bladder with surrounding stranding suggesting possibility of cystitis. Negative for hydronephrosis or ureteral stone. Punctuate nonobstructing stone in the left kidney. Sigmoid colon diverticular disease without acute inflammation. Bibasilar subpleural fibrosis. Comprehensive metabolic profile with a chloride of 100, glucose of 1:15, calcium of 8.7, AST of 78, ALT of 58. CBC had a hemoglobin of 11.5 otherwise within normal limits. Lactic acid level is 0.97. INR 1.08. Patient noted to have a temperature 100.8. Today. The right foot negative for fracture, malalignment or inflammatory arthropathy. Mild soft tissue swelling about the first MTP joint with associated minimal degenerative osteoarthritic change.  Hospital Course: Discharge Diagnoses:  Principal Problem:   Acute encephalopathy Active Problems:   GOUT   Amnestic MCI (mild cognitive impairment with memory loss)   Malignant neoplasm of prostate (HCC)   Dementia   Acute lower UTI   Fever   Acute urinary retention   Acute metabolic encephalopathy due to catheter associated urinary tract infection -Likely in the setting of a urinary tract infection, mental status improved and now is at baseline. He has underlying dementia and was placed on low dose Seroquel to help with sleep patterns and tolerated this well. Urine cultures resulted with Escherichia coli and Klebsiella, widely sensitive. On Keflex, to have 5 more days on d/c and complete a 12 day course Acute urinary retention -Foley catheter placed 7/26, failed a voiding trial, would favor leaving Foley in until seen in follow up by his Urologist Prostate cancer -Currently undergoing radiation therapy as an outpatient Diarrhea -Likely  related to proctitis as it appeared temporally related to  radiation therapy. Stable Dementia -Continue to monitor mental status, continue home meds and seroquel Acute kidney injury -Likely in the setting of dehydration, creatinine has now normalized with IV fluids Probable gouty attack -Received Solu-Medrol on admission / prednisone. Improving. Would continue colchicine for 5 more days. Improving  Fall -Patient had a fall 7/28 evening in the hospital. Imaging negative for any fractures   Discharge Instructions   Allergies as of 09/22/2016      Reactions   Hydrocodone Other (See Comments)   Reaction:  Hallucinations   Indocin [indomethacin] Other (See Comments)   Reaction:  Confusion    Amoxicillin-pot Clavulanate Other (See Comments)   Reaction:  Nightmares Has patient had a PCN reaction causing immediate rash, facial/tongue/throat swelling, SOB or lightheadedness with hypotension: No Has patient had a PCN reaction causing severe rash involving mucus membranes or skin necrosis: No Has patient had a PCN reaction that required hospitalization: No Has patient had a PCN reaction occurring within the last 10 years: Yes If all of the above answers are "NO", then may proceed with Cephalosporin use.   Erythromycin Ethylsuccinate Other (See Comments)   Reaction:  Eye irritation    Ibuprofen Other (See Comments)   Reaction:  Confusion  Pt is able to tolerate smaller doses.       Medication List    TAKE these medications   cephALEXin 500 MG capsule Commonly known as:  KEFLEX Take 1 capsule (500 mg total) by mouth every 8 (eight) hours. For 5 more days   colchicine 0.6 MG tablet Take 1 tablet (0.6 mg total) by mouth daily. When having a gout attack, take 2 tablets in day 1 then daily for 6 more days   Memantine HCl-Donepezil HCl 28-10 MG Cp24 Commonly known as:  NAMZARIC Take 28 mg by mouth daily.   predniSONE 20 MG tablet Commonly known as:  DELTASONE Take 2 tablets (40 mg total) by mouth daily before breakfast. For 2 more days     QUEtiapine 25 MG tablet Commonly known as:  SEROQUEL Take 0.5 tablets (12.5 mg total) by mouth at bedtime.   tamsulosin 0.4 MG Caps capsule Commonly known as:  FLOMAX Take 1 capsule (0.4 mg total) by mouth daily after supper.      Contact information for after-discharge care    Destination    [facility] .   Specialty:  [place] information: [address] [place] [phone]             Allergies  Allergen Reactions  . Hydrocodone Other (See Comments)    Reaction:  Hallucinations  . Indocin [Indomethacin] Other (See Comments)    Reaction:  Confusion   . Amoxicillin-Pot Clavulanate Other (See Comments)    Reaction:  Nightmares Has patient had a PCN reaction causing immediate rash, facial/tongue/throat swelling, SOB or lightheadedness with hypotension: No Has patient had a PCN reaction causing severe rash involving mucus membranes or skin necrosis: No Has patient had a PCN reaction that required hospitalization: No Has patient had a PCN reaction occurring within the last 10 years: Yes If all of the above answers are "NO", then may proceed with Cephalosporin use.  . Erythromycin Ethylsuccinate Other (See Comments)    Reaction:  Eye irritation   . Ibuprofen Other (See Comments)    Reaction:  Confusion  Pt is able to tolerate smaller doses.     Consultations:  None  Procedures/Studies:  Dg Chest 2 View  Result Date: 09/16/2016 CLINICAL DATA:  Possible sepsis. EXAM: CHEST  2 VIEW COMPARISON:  Radiographs of Jul 14, 2013. FINDINGS: The heart size and mediastinal contours are within normal limits. No pneumothorax or pleural effusion is noted. Mild bibasilar atelectasis or scarring is noted. Elevated left hemidiaphragm is again noted and unchanged. The visualized skeletal structures are unremarkable. IMPRESSION: Mild bibasilar subsegmental atelectasis or scarring. Electronically Signed   By: Marijo Conception, M.D.   On: 09/16/2016 14:54   Ct Head Wo Contrast  Result Date: 09/17/2016 CLINICAL DATA:  Confusion and hallucinations for 2 days. Urinary tract infection. History of dementia. History of prostate cancer on radiation. EXAM: CT HEAD WITHOUT CONTRAST TECHNIQUE: Contiguous axial images were obtained from the base of the skull through the vertex without intravenous contrast. COMPARISON:  None. FINDINGS: Brain: Diffuse cerebral atrophy. Low attention way shin changes in the deep white matter consistent with small vessel ischemia. No evidence of acute infarction, hemorrhage, hydrocephalus, extra-axial collection or mass lesion/mass effect. Vascular: Vascular calcifications are present. Skull: No depressed skull fractures. Sinuses/Orbits: Mucosal thickening in the paranasal sinuses. No acute air-fluid levels. 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 IMPRESSION: No acute intracranial abnormalities. Chronic atrophy and small vessel ischemic changes. Electronically Signed   By: Lucienne Capers M.D.   On: 09/17/2016 03:45   Ct Hip Right Wo Contrast  Result Date: 09/20/2016 CLINICAL DATA:  Severe right hip pain after falling yesterday. Dementia and urosepsis. EXAM: CT OF THE RIGHT HIP WITHOUT CONTRAST TECHNIQUE: Multidetector CT imaging of the right hip was performed according to the standard protocol. Multiplanar CT image reconstructions were also generated. COMPARISON:  Bilateral hip radiographs 09/19/2016. Pelvic CT 09/16/2016. FINDINGS: Bones/Joint/Cartilage Examination is limited to the right hip and inferior right hemipelvis. There is no evidence of acute fracture or dislocation. There are minimal degenerative changes at the right hip. 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 No significant hip joint effusion. Ligaments Not relevant for exam/indication. Muscles and Tendons The right hip periarticular musculature and tendons appear unremarkable. Soft tissues Mild  subcutaneous edema lateral to the right hip. No focal hematoma. Right inguinal hernia containing only fat appears unchanged. 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 The bladder is thick walled with small diverticula. There is a small amount of air within the bladder lumen, likely iatrogenic. Sigmoid diverticular changes are present. IMPRESSION: 1. No evidence of acute right hip fracture or dislocation. 2. No periarticular hematoma.  Mild subcutaneous edema laterally. 3. Air in the bladder lumen, presumed iatrogenic. Correlate clinically. Underlying bladder [place] thickening and diverticula again noted. Electronically Signed   By: Richardean Sale M.D.   On: 09/20/2016 11:28   Dg Knee Complete 4 Views Right  Result Date: 09/20/2016 CLINICAL DATA:  Right knee pain, fall. EXAM: RIGHT KNEE - COMPLETE 4+ VIEW COMPARISON:  None FINDINGS: Early spurring. No acute bony abnormality. Specifically, no fracture, subluxation, or dislocation. Soft tissues are intact. Trace joint effusion. IMPRESSION: No acute bony abnormality. Electronically Signed   By: Rolm Baptise M.D.   On: 09/20/2016 11:27   Dg Foot Complete Right  Result Date: 09/16/2016 CLINICAL DATA:  Seven 76 year old male with right first MTP joint pain and swelling EXAM: RIGHT FOOT COMPLETE - 3+ VIEW COMPARISON:  None. FINDINGS: No evidence of acute fracture or malalignment. No lytic or blastic osseous lesions. No evidence of erosion. Very minimal degenerative change at the great toe MTP joint. Atherosclerotic  vascular calcifications noted in the arteries of the foot. IMPRESSION: 1. No evidence of fracture, malalignment or inflammatory arthropathy. 2. Mild soft tissue swelling about the first MTP joint with associated minimal degenerative osteoarthritic change. Electronically Signed   By: Jacqulynn Cadet M.D.   On: 09/16/2016 16:34   Ct Renal Stone Study  Result Date: 09/16/2016 CLINICAL DATA:  Acute urinary retention EXAM: CT ABDOMEN AND  PELVIS WITHOUT CONTRAST TECHNIQUE: Multidetector CT imaging of the abdomen and pelvis was performed following the standard protocol without IV contrast. COMPARISON:  03/31/2016 FINDINGS: Lower chest: Bilateral subpleural fibrosis. No acute consolidation or pleural effusion. Normal heart size. Hepatobiliary: No focal liver abnormality is seen. No gallstones, gallbladder [place] thickening, or biliary dilatation. Pancreas: Unremarkable. No pancreatic ductal dilatation or surrounding inflammatory changes. Spleen: Normal in size without focal abnormality. Adrenals/Urinary Tract: Adrenal glands are within normal limits. Kidneys show no hydronephrosis. Punctate nonobstructing stone in the upper pole of the left kidney. Thick-walled bladder with surrounding edema. Small diverticula anteriorly and right posterior. Stomach/Bowel: Stomach is within normal limits. Appendix appears normal. No evidence of bowel Kesinger thickening, distention, or inflammatory changes. Sigmoid colon diverticular disease without acute inflammation Vascular/Lymphatic: Aortic atherosclerosis. No enlarged abdominal or pelvic lymph nodes. Reproductive: No mass.  Metallic densities at the prostate. Other: No free air. Fat in the right inguinal canal. No free fluid. Musculoskeletal: Degenerative changes. No acute or suspicious bone lesion. IMPRESSION: 1. Thick-walled appearance of the urinary bladder with surrounding stranding suggests possible cystitis. Suggest correlation with urinalysis. 2. Negative for hydronephrosis or ureteral stone. Punctate nonobstructing stone in the left kidney 3. Sigmoid colon diverticular disease without acute inflammation 4. Bibasilar subpleural fibrosis Electronically Signed   By: Donavan Foil M.D.   On: 09/16/2016 17:19   Dg Hips Bilat With Pelvis 3-4 Views  Result Date: 09/19/2016 CLINICAL DATA:  Fall.  History of dementia.  Limited movement. EXAM: DG HIP (WITH OR WITHOUT PELVIS) 3-4V BILAT COMPARISON:  None. FINDINGS:  Degenerative changes demonstrated in the lower lumbar spine and in both hips. No evidence of acute fracture or dislocation of the pelvis or hips. SI joints and symphysis pubis are not displaced. Visualized sacrum appears intact. Surgical clips over the prostate region. IMPRESSION: Degenerative changes in the lower lumbar spine and hips. No acute displaced fractures identified. Electronically Signed   By: Lucienne Capers M.D.   On: 09/19/2016 19:17     Subjective: - no chest pain, shortness of breath, no abdominal pain, nausea or vomiting.   Discharge Exam: Vitals:   09/21/16 2021 09/22/16 0416  BP: 107/68 129/78  Pulse: 73 75  Resp: 18 19  Temp: 98.3 F (36.8 C) 98.1 F (36.7 C)   Vitals:   09/21/16 0537 09/21/16 1621 09/21/16 2021 09/22/16 0416  BP: (!) 159/87 110/71 107/68 129/78  Pulse: 72 78 73 75  Resp: 17 18 18 19   Temp: (!) 97.5 F (36.4 C) 97.6 F (36.4 C) 98.3 F (36.8 C) 98.1 F (36.7 C)  TempSrc: Oral Oral Oral Oral  SpO2:  99% 96% 97%  Weight:    84.2 kg (185 lb 10 oz)  Height:        General: Pt is alert, awake, not in acute distress Cardiovascular: RRR, S1/S2 +, no rubs, no gallops Respiratory: CTA bilaterally, no wheezing, no rhonchi Abdominal: Soft, NT, ND, bowel sounds + Extremities: no edema, no cyanosis    The results of significant diagnostics from this hospitalization (including imaging, microbiology, ancillary and laboratory) are listed below for  reference.     Microbiology: Recent Results (from the past 240 hour(s))  Urine culture     Status: None   Collection Time: 09/12/16  3:06 PM  Result Value Ref Range Status   Specimen Description URINE, CATHETERIZED  Final   Special Requests NONE  Final   Culture NO GROWTH  Final   Report Status 09/13/2016 FINAL  Final  Culture, blood (Routine x 2)     Status: None   Collection Time: 09/16/16  1:59 PM  Result Value Ref Range Status   Specimen Description RIGHT ANTECUBITAL  Final   Special  Requests   Final    BOTTLES DRAWN AEROBIC AND ANAEROBIC Blood Culture adequate volume   Culture   Final    NO GROWTH 5 DAYS Performed at [facility], [address], [place], [place] [address]    Report Status 09/21/2016 FINAL  Final  Urine culture     Status: Abnormal   Collection Time: 09/16/16  2:00 PM  Result Value Ref Range Status   Specimen Description URINE, CATHETERIZED  Final   Special Requests Normal  Final   Culture (A)  Final    >=100,000 COLONIES/mL KLEBSIELLA PNEUMONIAE >=100,000 COLONIES/mL ESCHERICHIA COLI    Report Status 09/20/2016 FINAL  Final   Organism ID, Bacteria KLEBSIELLA PNEUMONIAE (A)  Final   Organism ID, Bacteria ESCHERICHIA COLI (A)  Final      Susceptibility   Escherichia coli - MIC*    AMPICILLIN <=2 SENSITIVE Sensitive     CEFAZOLIN <=4 SENSITIVE Sensitive     CEFTRIAXONE <=1 SENSITIVE Sensitive     CIPROFLOXACIN <=0.25 SENSITIVE Sensitive     GENTAMICIN <=1 SENSITIVE Sensitive     IMIPENEM <=0.25 SENSITIVE Sensitive     NITROFURANTOIN <=16 SENSITIVE Sensitive     TRIMETH/SULFA <=20 SENSITIVE Sensitive     AMPICILLIN/SULBACTAM <=2 SENSITIVE Sensitive     PIP/TAZO <=4 SENSITIVE Sensitive     Extended ESBL NEGATIVE Sensitive     * >=100,000 COLONIES/mL ESCHERICHIA COLI   Klebsiella pneumoniae - MIC*    AMPICILLIN >=32 RESISTANT Resistant     CEFAZOLIN <=4 SENSITIVE Sensitive     CEFTRIAXONE <=1 SENSITIVE Sensitive     CIPROFLOXACIN <=0.25 SENSITIVE Sensitive     GENTAMICIN <=1 SENSITIVE Sensitive     IMIPENEM <=0.25 SENSITIVE Sensitive     NITROFURANTOIN 64 INTERMEDIATE Intermediate     TRIMETH/SULFA <=20 SENSITIVE Sensitive     AMPICILLIN/SULBACTAM 8 SENSITIVE Sensitive     PIP/TAZO <=4 SENSITIVE Sensitive     Extended ESBL NEGATIVE Sensitive     * >=100,000 COLONIES/mL KLEBSIELLA PNEUMONIAE  Culture, blood (Routine x 2)     Status: None (Preliminary result)   Collection Time: 09/16/16  9:18 PM  Result Value Ref Range Status    Specimen Description BLOOD BLOOD RIGHT HAND  Final   Special Requests IN PEDIATRIC BOTTLE Blood Culture adequate volume  Final   Culture   Final    NO GROWTH 4 DAYS Performed at [facility], [place] [address], [place], [place] [address]    Report Status PENDING  Incomplete     Labs: BNP (last 3 results) No results for input(s): BNP in the last 8760 hours. Basic Metabolic Panel:  Recent Labs Lab 09/16/16 1359 09/16/16 2100 09/17/16 0450  NA 139  --  138  K 3.6  --  3.8  CL 100*  --  104  CO2 29  --  24  GLUCOSE 115*  --  163*  BUN 14  --  14  CREATININE 1.09  --  0.97  CALCIUM 8.7*  --  8.2*  MG  --  1.6*  --    Liver Function Tests:  Recent Labs Lab 09/16/16 1359  AST 78*  ALT 58  ALKPHOS 48  BILITOT 0.9  PROT 7.2  ALBUMIN 3.1*   No results for input(s): LIPASE, AMYLASE in the last 168 hours.  Recent Labs Lab 09/16/16 2118  AMMONIA 13   CBC:  Recent Labs Lab 09/16/16 1359 09/16/16 2118 09/17/16 0450  WBC 4.6  --  4.2  NEUTROABS 3.8  --   --   HGB 11.5*  --  10.6*  HCT 32.6* 32.1* 29.4*  MCV 91.6  --  88.6  PLT 159  --  140*   Cardiac Enzymes: No results for input(s): CKTOTAL, CKMB, CKMBINDEX, TROPONINI in the last 168 hours. BNP: Invalid input(s): POCBNP CBG:  Recent Labs Lab 09/21/16 0735 09/21/16 1158 09/21/16 1819 09/21/16 2107 09/22/16 0755  GLUCAP 113* 151* 136* 139* 96   D-Dimer No results for input(s): DDIMER in the last 72 hours. Hgb A1c No results for input(s): HGBA1C in the last 72 hours. Lipid Profile No results for input(s): CHOL, HDL, LDLCALC, TRIG, CHOLHDL, LDLDIRECT in the last 72 hours. Thyroid function studies No results for input(s): TSH, T4TOTAL, T3FREE, THYROIDAB in the last 72 hours.  Invalid input(s): FREET3 Anemia work up No results for input(s): VITAMINB12, FOLATE, FERRITIN, TIBC, IRON, RETICCTPCT in the last 72 hours. Urinalysis    Component Value Date/Time   COLORURINE YELLOW 09/16/2016 1400    APPEARANCEUR HAZY (A) 09/16/2016 1400   LABSPEC 1.017 09/16/2016 1400   PHURINE 5.0 09/16/2016 1400   GLUCOSEU NEGATIVE 09/16/2016 1400   HGBUR MODERATE (A) 09/16/2016 1400   BILIRUBINUR NEGATIVE 09/16/2016 1400   KETONESUR 80 (A) 09/16/2016 1400   PROTEINUR 100 (A) 09/16/2016 1400   UROBILINOGEN 0.2 07/21/2013 1119   NITRITE POSITIVE (A) 09/16/2016 1400   LEUKOCYTESUR LARGE (A) 09/16/2016 1400   Sepsis Labs Invalid input(s): PROCALCITONIN,  WBC,  LACTICIDVEN Microbiology Recent Results (from the past 240 hour(s))  Urine culture     Status: None   Collection Time: 09/12/16  3:06 PM  Result Value Ref Range Status   Specimen Description URINE, CATHETERIZED  Final   Special Requests NONE  Final   Culture NO GROWTH  Final   Report Status 09/13/2016 FINAL  Final  Culture, blood (Routine x 2)     Status: None   Collection Time: 09/16/16  1:59 PM  Result Value Ref Range Status   Specimen Description RIGHT ANTECUBITAL  Final   Special Requests   Final    BOTTLES DRAWN AEROBIC AND ANAEROBIC Blood Culture adequate volume   Culture   Final    NO GROWTH 5 DAYS Performed at [facility], [address], [place], [place] [address]    Report Status 09/21/2016 FINAL  Final  Urine culture     Status: Abnormal   Collection Time: 09/16/16  2:00 PM  Result Value Ref Range Status   Specimen Description URINE, CATHETERIZED  Final   Special Requests Normal  Final   Culture (A)  Final    >=100,000 COLONIES/mL KLEBSIELLA PNEUMONIAE >=100,000 COLONIES/mL ESCHERICHIA COLI    Report Status 09/20/2016 FINAL  Final   Organism ID, Bacteria KLEBSIELLA PNEUMONIAE (A)  Final   Organism ID, Bacteria ESCHERICHIA COLI (A)  Final      Susceptibility   Escherichia coli -  MIC*    AMPICILLIN <=2 SENSITIVE Sensitive     CEFAZOLIN <=4 SENSITIVE Sensitive     CEFTRIAXONE <=1 SENSITIVE Sensitive     CIPROFLOXACIN <=0.25 SENSITIVE Sensitive     GENTAMICIN <=1 SENSITIVE Sensitive     IMIPENEM <=0.25  SENSITIVE Sensitive     NITROFURANTOIN <=16 SENSITIVE Sensitive     TRIMETH/SULFA <=20 SENSITIVE Sensitive     AMPICILLIN/SULBACTAM <=2 SENSITIVE Sensitive     PIP/TAZO <=4 SENSITIVE Sensitive     Extended ESBL NEGATIVE Sensitive     * >=100,000 COLONIES/mL ESCHERICHIA COLI   Klebsiella pneumoniae - MIC*    AMPICILLIN >=32 RESISTANT Resistant     CEFAZOLIN <=4 SENSITIVE Sensitive     CEFTRIAXONE <=1 SENSITIVE Sensitive     CIPROFLOXACIN <=0.25 SENSITIVE Sensitive     GENTAMICIN <=1 SENSITIVE Sensitive     IMIPENEM <=0.25 SENSITIVE Sensitive     NITROFURANTOIN 64 INTERMEDIATE Intermediate     TRIMETH/SULFA <=20 SENSITIVE Sensitive     AMPICILLIN/SULBACTAM 8 SENSITIVE Sensitive     PIP/TAZO <=4 SENSITIVE Sensitive     Extended ESBL NEGATIVE Sensitive     * >=100,000 COLONIES/mL KLEBSIELLA PNEUMONIAE  Culture, blood (Routine x 2)     Status: None (Preliminary result)   Collection Time: 09/16/16  9:18 PM  Result Value Ref Range Status   Specimen Description BLOOD BLOOD RIGHT HAND  Final   Special Requests IN PEDIATRIC BOTTLE Blood Culture adequate volume  Final   Culture   Final    NO GROWTH 4 DAYS Performed at [facility], [address], [place], [place] [address]    Report Status PENDING  Incomplete   Time coordinating discharge: 35 minutes  SIGNED:  Marzetta Board, MD  [facility] 09/22/2016, 10:56 AM Pager [phone]  If 7PM-7AM, please contact night-coverage [email or web address] Password [email or web address]

## 2016-09-22 NOTE — Clinical Social Work Placement (Signed)
Patient received and accepted bed offer at [facility]. PTAR contacted, family notified. Patient's RN can call report to [phone], room 602P. Packet complete.  CLINICAL SOCIAL WORK PLACEMENT  NOTE  Date:  09/22/2016  Patient Details  Name: Ricky Boyd MRN: [id number] Date of Birth: [birth year]  Clinical Social Work is seeking post-discharge placement for this patient at the [place] level of care (*CSW will initial, date and re-position this form in  chart as items are completed):  Yes   Patient/family provided with [facility] Department's list of facilities offering this level of care within the geographic area requested by the patient (or if unable, by the patient's family).  Yes   Patient/family informed of their freedom to choose among providers that offer the needed level of care, that participate in Medicare, Medicaid or managed care program needed by the patient, have an available bed and are willing to accept the patient.  Yes   Patient/family informed of [facility]'s ownership interest in [facility] and [facility], as well as of the fact that they are under no obligation to receive care at these facilities.  PASRR submitted to EDS on 09/17/16     PASRR number received on 09/17/16     Existing PASRR number confirmed on       FL2 transmitted to all facilities in geographic area requested by pt/family on 09/17/16     FL2 transmitted to all facilities within larger geographic area on 09/17/16     Patient informed that his/her managed care company has contracts with or will negotiate with certain facilities, including the following:        Yes   Patient/family informed of bed offers received.  Patient chooses bed at [facility]     Physician recommends and patient chooses bed at      Patient to be transferred to [facility] on 09/22/16.  Patient to be transferred to facility by PTAR     Patient family notified on  09/22/16 of transfer.  Name of family member notified:  Hollice Espy     PHYSICIAN       Additional Comment:    _______________________________________________ Burnis Medin, LCSW 09/22/2016, 2:23 PM

## 2016-09-22 NOTE — Progress Notes (Signed)
Discharge instructions and report called to Paramedic at [facility]. PTAR to transport. Condition unchanged. Eulas Post, RN

## 2016-09-30 ENCOUNTER — Encounter (HOSPITAL_COMMUNITY): Payer: Self-pay

## 2016-09-30 ENCOUNTER — Emergency Department (HOSPITAL_COMMUNITY)
Admission: EM | Admit: 2016-09-30 | Discharge: 2016-10-01 | Disposition: A | Discharge status: Home / Self Care | Payer: Medicare Other | Attending: Emergency Medicine | Admitting: Emergency Medicine

## 2016-09-30 DIAGNOSIS — R339 Retention of urine, unspecified: Secondary | ICD-10-CM

## 2016-09-30 DIAGNOSIS — Z87891 Personal history of nicotine dependence: Secondary | ICD-10-CM | POA: Diagnosis not present

## 2016-09-30 DIAGNOSIS — Z79899 Other long term (current) drug therapy: Secondary | ICD-10-CM | POA: Diagnosis not present

## 2016-09-30 MED ORDER — LIDOCAINE HCL 2 % EX GEL
CUTANEOUS | Status: AC
  Administered 2016-10-01: 11
  Filled 2016-09-30: qty 11

## 2016-09-30 NOTE — ED Triage Notes (Signed)
Pt BIB [facility] from [facility] c/o urinary retention. He states that he has not urinated since about 8a. Facility reports that they attempted to catheterize pt without success. Hx Alzheimers and prostate cancer. A&Ox3.

## 2016-09-30 NOTE — ED Provider Notes (Signed)
[facility] DEPT Provider Note   CSN: [id number] Arrival date & time: 09/30/16  2246     History   Chief Complaint Chief Complaint  Patient presents with  . Urinary Retention    HPI Ricky Boyd is a 76 y.o. male.  Patient is a 76 year old male with past medical history of dementia, prostate cancer, and urinary retention. He presents for evaluation of urinary retention. He currently has his Foley catheter removed this morning, however has been unable to void since. He denies any fevers or chills. History is somewhat limited secondary to dementia.   The history is provided by the patient, the EMS personnel and the nursing home.    Past Medical History:  Diagnosis Date  . Arthritis   . Dementia 09/16/2016  . Hearing aid worn   . Memory deficit   . Prostate cancer [facility])     Patient Active Problem List   Diagnosis Date Noted  . Acute encephalopathy 09/16/2016  . Dementia 09/16/2016  . Acute lower UTI 09/16/2016  . Fever 09/16/2016  . Acute urinary retention 09/16/2016  . Urinary tract infection without hematuria   . Dehydration   . Malignant neoplasm of prostate ([place]) 04/23/2016  . Sinusitis, acute 03/06/2016  . Erectile dysfunction 07/23/2015  . 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 Elevated PSA, less than 10 ng/ml 06/27/2012  . Impotence 01/28/2012  . GOUT 12/26/2008  . MEMORY LOSS 12/26/2008  . 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 History of colonic polyps 10/28/2006  . 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 COLONOSCOPY    . HEMORROIDECTOMY    . PROSTATE BIOPSY    . TONSILLECTOMY AND ADENOIDECTOMY         Home Medications    Prior to Admission medications   Medication Sig Start Date End Date Taking? Authorizing Provider  cephALEXin (KEFLEX) 500 MG capsule Take 1 capsule (500 mg total) by mouth every 8 (eight) hours. For 5 more days 09/22/16   Caren Griffins, MD    colchicine 0.6 MG tablet Take 1 tablet (0.6 mg total) by mouth daily. When having a gout attack, take 2 tablets in day 1 then daily for 6 more days 09/20/16 09/20/17  Caren Griffins, MD  Memantine HCl-Donepezil HCl (NAMZARIC) 28-10 MG CP24 Take 28 mg by mouth daily. 02/27/16   Lucille Passy, MD  predniSONE (DELTASONE) 20 MG tablet Take 2 tablets (40 mg total) by mouth daily before breakfast. For 2 more days 09/19/16   Caren Griffins, MD  QUEtiapine (SEROQUEL) 25 MG tablet Take 0.5 tablets (12.5 mg total) by mouth at bedtime. 09/22/16   Caren Griffins, MD  tamsulosin (FLOMAX) 0.4 MG CAPS capsule Take 1 capsule (0.4 mg total) by mouth daily after supper. 09/18/16   Caren Griffins, MD    Family History Family History  Problem Relation Age of Onset  . Diverticulitis Father   . Depression Brother   . Cancer Paternal Uncle   . Colon cancer Neg Hx     Social History Social History  Substance Use Topics  . Smoking status: Former Smoker    Packs/day: 0.25    Years: 10.00    Types: Cigarettes    Quit date: 02/23/1958  . Smokeless tobacco: Former Systems developer     Comment: quit 1963  . Alcohol use 3.6 oz/week    6 Cans of beer per week     Comment: occasionally  Allergies   Hydrocodone; Indocin [indomethacin]; Amoxicillin-pot clavulanate; Erythromycin ethylsuccinate; and Ibuprofen   Review of Systems Review of Systems  All other systems reviewed and are negative.    Physical Exam Updated Vital Signs BP 113/70 (BP Location: Left Arm)   Pulse (!) 102   Temp 98.1 F (36.7 C) (Oral)   Resp 19   SpO2 97%   Physical Exam  Constitutional: He is oriented to person, place, and time. He appears well-developed and well-nourished. No distress.  HENT:  Head: Normocephalic and atraumatic.  Mouth/Throat: Oropharynx is clear and moist.  Neck: Normal range of motion. Neck supple.  Cardiovascular: Normal rate and regular rhythm.  Exam reveals no friction rub.   No murmur  heard. Pulmonary/Chest: Effort normal and breath sounds normal. No respiratory distress. He has no wheezes. He has no rales.  Abdominal: Soft. Bowel sounds are normal. He exhibits no distension. There is no tenderness.  There is suprapubic fullness, however no tenderness to palpation.  Musculoskeletal: Normal range of motion. He exhibits no edema.  Neurological: He is alert and oriented to person, place, and time. Coordination normal.  Skin: Skin is warm and dry. He is not diaphoretic.  Nursing note and vitals reviewed.    ED Treatments / Results  Labs (all labs ordered are listed, but only abnormal results are displayed) Labs Reviewed  URINALYSIS, ROUTINE W REFLEX MICROSCOPIC    EKG  EKG Interpretation None       Radiology No results found.  Procedures Procedures (including critical care time)  Medications Ordered in ED Medications - No data to display   Initial Impression / Assessment and Plan / ED Course  I have reviewed the triage vital signs and the nursing notes.  Pertinent labs & imaging results that were available during my care of the patient were reviewed by me and considered in my medical decision making (see chart for details).  Foley catheter placed with greater than 700 mL of urine obtained. Symptoms have resolved. He will be discharged with the catheter in place and is to follow-up with urology.  Final Clinical Impressions(s) / ED Diagnoses   Final diagnoses:  None    New Prescriptions New Prescriptions   No medications on file     Veryl Speak, MD 10/01/16 [phone]

## 2016-10-01 LAB — URINALYSIS, ROUTINE W REFLEX MICROSCOPIC
BILIRUBIN URINE: NEGATIVE
Glucose, UA: NEGATIVE mg/dL
KETONES UR: NEGATIVE mg/dL
LEUKOCYTES UA: NEGATIVE
Nitrite: NEGATIVE
PH: 5 (ref 5.0–8.0)
PROTEIN: 100 mg/dL — AB
SQUAMOUS EPITHELIAL / LPF: NONE SEEN
Specific Gravity, Urine: 1.011 (ref 1.005–1.030)

## 2016-10-01 NOTE — Discharge Instructions (Signed)
Follow-up with urology in the next week, and return to the ER if symptoms worsen or change.

## 2016-10-01 NOTE — ED Notes (Signed)
PTAR called  

## 2016-10-08 ENCOUNTER — Telehealth: Payer: Self-pay | Admitting: *Deleted

## 2016-10-08 ENCOUNTER — Encounter: Payer: Self-pay | Admitting: Family Medicine

## 2016-10-08 ENCOUNTER — Ambulatory Visit (INDEPENDENT_AMBULATORY_CARE_PROVIDER_SITE_OTHER): Payer: Medicare Other | Admitting: Family Medicine

## 2016-10-08 ENCOUNTER — Ambulatory Visit: Payer: Medicare Other | Admitting: Family Medicine

## 2016-10-08 VITALS — BP 130/76 | HR 91 | Temp 98.8°F

## 2016-10-08 DIAGNOSIS — G3184 Mild cognitive impairment, so stated: Secondary | ICD-10-CM | POA: Diagnosis not present

## 2016-10-08 DIAGNOSIS — R339 Retention of urine, unspecified: Secondary | ICD-10-CM | POA: Insufficient documentation

## 2016-10-08 DIAGNOSIS — M109 Gout, unspecified: Secondary | ICD-10-CM | POA: Diagnosis not present

## 2016-10-08 DIAGNOSIS — C61 Malignant neoplasm of prostate: Secondary | ICD-10-CM | POA: Diagnosis not present

## 2016-10-08 NOTE — Telephone Encounter (Signed)
pts daughter contacted office and states pt was recently referred to [facility] urology. She is wanting to know what to do with his current catheter, if anything. pls advise

## 2016-10-08 NOTE — Assessment & Plan Note (Signed)
Deteriorated. Restart colchicine- order written and given to family to give to RN at [facility] place. Call or return to clinic prn if these symptoms worsen or fail to improve as anticipated. The patient indicates understanding of these issues and agrees with the plan.

## 2016-10-08 NOTE — Patient Instructions (Signed)
Great to see you. Please stop by to see Ricky Boyd on your way out.

## 2016-10-08 NOTE — Assessment & Plan Note (Signed)
Persistent issue and I understand the family's frustration.  It has been quite traumatic for him and Ricky Boyd to keep going to the ER for urinary retention.  Urology second opinion is reasonable.  Will refer to [facility].   The patient indicates understanding of these issues and agrees with the plan.

## 2016-10-08 NOTE — Progress Notes (Signed)
Subjective:   Patient ID: Ricky Boyd, male    DOB: [birth year], 76 y.o.   MRN: [id number]  Ricky Boyd is a pleasant 76 y.o. year old male who presents to clinic today with his daughter and sister for ER follow up  on 10/08/2016  HPI:  Has had a difficult few months.  Several ER visits for urinary retention in setting of chronic intermittent foley/prostate CA/UTI. Most recently seen on 09/30/16.  Note reviewed.  Foley catheter was placed in the ER, greater than 700 ml of urine was obtained and discharged home with foley and advised follow up with urology.  He has been at [facility] for rehab since discharge.  Goes home tomorrow.  Family is understandably concerned about this constant removal of foley cath and him ending up in the ER with urinary retention/UTI.  They have attempted to talk with his local urologist about this but feel there is not a good plan in place.  Unclear if urethra sticture will improve with time s/p prostate radiation.  Gout- Colchicine not restarted at [facility] place and now having a flare in his right foot- painful and warm.  Current Outpatient Prescriptions on File Prior to Visit  Medication Sig Dispense Refill  . acetaminophen (TYLENOL) 500 MG tablet Take 1,000 mg by mouth every 6 (six) hours as needed for mild pain.    Marland Kitchen colchicine 0.6 MG tablet Take 1 tablet (0.6 mg total) by mouth daily. When having a gout attack, take 2 tablets in day 1 then daily for 6 more days 8 tablet 2  . Memantine HCl-Donepezil HCl (NAMZARIC) 28-10 MG CP24 Take 28 mg by mouth daily. 30 capsule 5  . QUEtiapine (SEROQUEL) 25 MG tablet Take 0.5 tablets (12.5 mg total) by mouth at bedtime.    . tamsulosin (FLOMAX) 0.4 MG CAPS capsule Take 1 capsule (0.4 mg total) by mouth daily after supper. 30 capsule    No current facility-administered medications on file prior to visit.     Allergies  Allergen Reactions  . Hydrocodone Other (See Comments)    Reaction:  Hallucinations  . Indocin  [Indomethacin] Other (See Comments)    Reaction:  Confusion   . Amoxicillin-Pot Clavulanate Other (See Comments)    Reaction:  Nightmares Has patient had a PCN reaction causing immediate rash, facial/tongue/throat swelling, SOB or lightheadedness with hypotension: No Has patient had a PCN reaction causing severe rash involving mucus membranes or skin necrosis: No Has patient had a PCN reaction that required hospitalization: No Has patient had a PCN reaction occurring within the last 10 years: Yes If all of the above answers are "NO", then may proceed with Cephalosporin use.  . Erythromycin Ethylsuccinate Other (See Comments)    Reaction:  Eye irritation     Past Medical History:  Diagnosis Date  . Arthritis   . Dementia 09/16/2016  . Hearing aid worn   . Memory deficit   . Prostate cancer [facility])     Past Surgical History:  Procedure Laterality Date  . COLONOSCOPY    . HEMORROIDECTOMY    . PROSTATE BIOPSY    . TONSILLECTOMY AND ADENOIDECTOMY      Family History  Problem Relation Age of Onset  . Diverticulitis Father   . Depression Brother   . Cancer Paternal Uncle   . Colon cancer Neg Hx     Social History   Social History  . Marital status: Single    Spouse name: N/A  . Number of children: 3  .  Years of education: Master's   Occupational History  . Not on file.   Social History Main Topics  . Smoking status: Former Smoker    Packs/day: 0.25    Years: 10.00    Types: Cigarettes    Quit date: 02/23/1958  . Smokeless tobacco: Former Systems developer     Comment: quit 1963  . Alcohol use 3.6 oz/week    6 Cans of beer per week     Comment: occasionally  . Drug use: No  . Sexual activity: Yes   Other Topics Concern  . Not on file   Social History Narrative   Desires CPR.      Would desire life support and and possibly feeding tube.      Patient currently lives alone- 2nd wife killed herself. Lives in one story house.    Patient is left handed.   Patient has a  Scientist, water quality.    The PMH, PSH, Social History, Family History, Medications, and allergies have been reviewed in [facility], and have been updated if relevant.   Review of Systems  Respiratory: Negative.   Gastrointestinal: Negative.   Genitourinary: Positive for difficulty urinating.  Musculoskeletal: Positive for arthralgias.  Neurological: Negative.   Hematological: Negative.   Psychiatric/Behavioral: Negative.   All other systems reviewed and are negative.      Objective:    BP 130/76   Pulse 91   Temp 98.8 F (37.1 C)   SpO2 98%    Physical Exam  Constitutional: He is oriented to person, place, and time. He appears well-developed and well-nourished.  In wheelchair, foley bag attached to chair, NAD but does appear uncomfortable.  HENT:  Head: Normocephalic and atraumatic.  Eyes: Conjunctivae are normal.  Cardiovascular: Normal rate.   Pulmonary/Chest: Effort normal.  Neurological: He is alert and oriented to person, place, and time.  Skin: Skin is warm and dry.  Psychiatric: He has a normal mood and affect. His behavior is normal. Judgment and thought content normal.  Nursing note and vitals reviewed.         Assessment & Plan:   Malignant neoplasm of prostate ([place]) - Plan: Ambulatory referral to Urology  Amnestic MCI (mild cognitive impairment with memory loss)  Acute gout of multiple sites, unspecified cause  Urinary retention - Plan: Ambulatory referral to Urology No Follow-up on file.

## 2016-10-08 NOTE — Telephone Encounter (Signed)
She would need to follow up with his current urologist for that advice, unfortunately, until he is seen at [facility].

## 2016-10-09 NOTE — Telephone Encounter (Signed)
Spoke with Daughter & gave recommendation per Dr Deborra Medina

## 2016-10-17 ENCOUNTER — Emergency Department (HOSPITAL_COMMUNITY)
Admission: EM | Admit: 2016-10-17 | Discharge: 2016-10-17 | Disposition: A | Discharge status: Home / Self Care | Payer: Medicare Other | Attending: Emergency Medicine | Admitting: Emergency Medicine

## 2016-10-17 ENCOUNTER — Encounter (HOSPITAL_COMMUNITY): Payer: Self-pay | Admitting: Emergency Medicine

## 2016-10-17 DIAGNOSIS — Z8546 Personal history of malignant neoplasm of prostate: Secondary | ICD-10-CM | POA: Diagnosis not present

## 2016-10-17 DIAGNOSIS — F0390 Unspecified dementia without behavioral disturbance: Secondary | ICD-10-CM | POA: Insufficient documentation

## 2016-10-17 DIAGNOSIS — Z87891 Personal history of nicotine dependence: Secondary | ICD-10-CM | POA: Insufficient documentation

## 2016-10-17 DIAGNOSIS — R339 Retention of urine, unspecified: Secondary | ICD-10-CM

## 2016-10-17 DIAGNOSIS — Z79899 Other long term (current) drug therapy: Secondary | ICD-10-CM | POA: Insufficient documentation

## 2016-10-17 LAB — URINALYSIS, ROUTINE W REFLEX MICROSCOPIC
Bilirubin Urine: NEGATIVE
Glucose, UA: NEGATIVE mg/dL
HGB URINE DIPSTICK: NEGATIVE
KETONES UR: NEGATIVE mg/dL
LEUKOCYTES UA: NEGATIVE
Nitrite: NEGATIVE
PROTEIN: NEGATIVE mg/dL
Specific Gravity, Urine: >1.03 — ABNORMAL HIGH (ref 1.005–1.030)
pH: 6 (ref 5.0–8.0)

## 2016-10-17 NOTE — ED Triage Notes (Signed)
Pt removed urinary catheter for urinary retention at 0700 this am. Has been unable to urinate since then. Hx of dementia, family at bedside

## 2016-10-17 NOTE — ED Notes (Signed)
Bladder scan showed about 210ml in bladder

## 2016-10-17 NOTE — ED Provider Notes (Signed)
[facility] DEPT Provider Note   CSN: [id number] Arrival date & time: 10/17/16  1441     History   Chief Complaint Chief Complaint  Patient presents with  . Urinary Retention    HPI Ricky Boyd is a 76 y.o. male.  HPI Patient presents with urinary retention. History of same. Foley catheter, earlier today after the catheter had been cut. He has had ashen place and was unable to get one back in. Has not really urinated since the catheter was in this morning. States he feels fine and does not feel as if he has to urinate. Bladder scan  2 hours ago showed 200 mL in the bladder.has a urologist both here and in [place]. Has an appointment in early September in [place].  nursing home had attemptedcatheter placement and reportedly could not get it placed also there was bloody discharge after the attempt Past Medical History:  Diagnosis Date  . Arthritis   . Dementia 09/16/2016  . Hearing aid worn   . Memory deficit   . Prostate cancer [facility])     Patient Active Problem List   Diagnosis Date Noted  . Urinary retention 10/08/2016  . Acute encephalopathy 09/16/2016  . Dementia 09/16/2016  . Urinary tract infection without hematuria   . Dehydration   . Malignant neoplasm of prostate ([place]) 04/23/2016  . Erectile dysfunction 07/23/2015  . 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 Elevated PSA, less than 10 ng/ml 06/27/2012  . Impotence 01/28/2012  . GOUT 12/26/2008  . MEMORY LOSS 12/26/2008  . 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 History of colonic polyps 10/28/2006  . 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 COLONOSCOPY    . HEMORROIDECTOMY    . PROSTATE BIOPSY    . TONSILLECTOMY AND ADENOIDECTOMY         Home Medications    Prior to Admission medications   Medication Sig Start Date End Date Taking? Authorizing Provider  acetaminophen (TYLENOL) 500 MG tablet Take 1,000 mg by mouth every 6  (six) hours as needed for mild pain.    [provider]  colchicine 0.6 MG tablet Take 1 tablet (0.6 mg total) by mouth daily. When having a gout attack, take 2 tablets in day 1 then daily for 6 more days 09/20/16 09/20/17  Caren Griffins, MD  Memantine HCl-Donepezil HCl (NAMZARIC) 28-10 MG CP24 Take 28 mg by mouth daily. 02/27/16   Lucille Passy, MD  QUEtiapine (SEROQUEL) 25 MG tablet Take 0.5 tablets (12.5 mg total) by mouth at bedtime. 09/22/16   Caren Griffins, MD  tamsulosin (FLOMAX) 0.4 MG CAPS capsule Take 1 capsule (0.4 mg total) by mouth daily after supper. 09/18/16   Caren Griffins, MD    Family History Family History  Problem Relation Age of Onset  . Diverticulitis Father   . Depression Brother   . Cancer Paternal Uncle   . Colon cancer Neg Hx     Social History Social History  Substance Use Topics  . Smoking status: Former Smoker    Packs/day: 0.25    Years: 10.00    Types: Cigarettes    Quit date: 02/23/1958  . Smokeless tobacco: Former Systems developer     Comment: quit 1963  . Alcohol use 3.6 oz/week    6 Cans of beer per week     Comment: occasionally     Allergies   Hydrocodone; Indocin [indomethacin]; Amoxicillin-pot clavulanate; and  Erythromycin ethylsuccinate   Review of Systems Review of Systems  Constitutional: Negative for chills and fever.  HENT: Negative for congestion.   Cardiovascular: Negative for chest pain.  Gastrointestinal: Negative for abdominal pain.  Genitourinary: Positive for difficulty urinating. Negative for discharge and urgency.  Musculoskeletal: Negative for back pain.  Skin: Negative for pallor and rash.     Physical Exam Updated Vital Signs BP 113/79 (BP Location: Left Arm)   Pulse 100   Temp 97.7 F (36.5 C) (Oral)   Resp 18   SpO2 95%   Physical Exam  Constitutional: He appears well-developed.  HENT:  Head: Normocephalic.  Neck: Neck supple.  Cardiovascular: Normal rate.   Pulmonary/Chest: Effort normal.    Abdominal: He exhibits mass. There is no tenderness.  Does appear to have suprapubic fullness  Musculoskeletal: He exhibits no edema.  Neurological: He is alert.  Patient is awake and pleasant.  Skin: Skin is warm.     ED Treatments / Results  Labs (all labs ordered are listed, but only abnormal results are displayed) Labs Reviewed  URINE CULTURE  URINALYSIS, ROUTINE W REFLEX MICROSCOPIC    EKG  EKG Interpretation None       Radiology No results found.  Procedures Procedures (including critical care time)  Medications Ordered in ED Medications - No data to display   Initial Impression / Assessment and Plan / ED Course  I have reviewed the triage vital signs and the nursing notes.  Pertinent labs & imaging results that were available during my care of the patient were reviewed by me and considered in my medical decision making (see chart for details).     Patient with urinary retention. History of same. Previous Foley came out earlier today. Replaced. Discharge home follow-up with urology. Urinalysisdrawn and cultures sent but will not empirically treat. Culture can be followed by urology.  Final Clinical Impressions(s) / ED Diagnoses   Final diagnoses:  Urinary retention    New Prescriptions New Prescriptions   No medications on file     Davonna Belling, MD 10/17/16 [phone]

## 2016-10-17 NOTE — Discharge Instructions (Signed)
Follow up with your Urologist as planned.

## 2016-10-20 LAB — URINE CULTURE: Culture: >=100000 — AB

## 2016-10-21 ENCOUNTER — Telehealth: Payer: Self-pay | Admitting: Emergency Medicine

## 2016-10-21 NOTE — Telephone Encounter (Signed)
Post ED Visit - Positive Culture Follow-up  Culture report reviewed by antimicrobial stewardship pharmacist:  []  Elenor Quinones, Pharm.D. []  Heide Guile, Pharm.D., BCPS AQ-ID []  Parks Neptune, Pharm.D., BCPS []  Alycia Rossetti, Pharm.D., BCPS []  [employer], Pharm.D., BCPS, AAHIVP []  Legrand Como, Pharm.D., BCPS, AAHIVP []  Salome Arnt, PharmD, BCPS []  Dimitri Ped, PharmD, BCPS []  Vincenza Hews, PharmD, BCPS Leeroy Cha PharmD  Positive urine culture Treated with none,asymptomatic,  no further patient follow-up is required at this time.  Hazle Nordmann 10/21/2016, 1:26 PM

## 2016-10-21 NOTE — Progress Notes (Signed)
ED Antimicrobial Stewardship Positive Culture Follow Up   Ricky Boyd is an 76 y.o. male who presented to [facility] on 10/17/2016 with a chief complaint of  Chief Complaint  Patient presents with  . Urinary Retention    Recent Results (from the past 720 hour(s))  Urine culture     Status: Abnormal   Collection Time: 10/17/16  7:00 PM  Result Value Ref Range Status   Specimen Description URINE, RANDOM  Final   Special Requests NONE  Final   Culture >=100,000 COLONIES/mL ENTEROBACTER SPECIES (A)  Final   Report Status 10/20/2016 FINAL  Final   Organism ID, Bacteria ENTEROBACTER SPECIES (A)  Final      Susceptibility   Enterobacter species - MIC*    CEFAZOLIN >=64 RESISTANT Resistant     CEFTRIAXONE 8 SENSITIVE Sensitive     CIPROFLOXACIN <=0.25 SENSITIVE Sensitive     GENTAMICIN <=1 SENSITIVE Sensitive     IMIPENEM <=0.25 SENSITIVE Sensitive     NITROFURANTOIN 128 RESISTANT Resistant     TRIMETH/SULFA <=20 SENSITIVE Sensitive     PIP/TAZO 8 SENSITIVE Sensitive     * >=100,000 COLONIES/mL ENTEROBACTER SPECIES    Plan: Asymptomatic bacteruria, does not require treatment with antibiotics.  ED Provider: Glendell Docker, NP   Mila Merry Gerarda Fraction, PharmD PGY1 Pharmacy Resident Pager: [phone]

## 2016-10-24 ENCOUNTER — Inpatient Hospital Stay (HOSPITAL_COMMUNITY)
Admission: EM | Admit: 2016-10-24 | Discharge: 2016-11-02 | DRG: 698 | Disposition: A | Discharge status: Skilled Nursing Facility | Payer: Medicare Other | Attending: Internal Medicine | Admitting: Internal Medicine

## 2016-10-24 ENCOUNTER — Encounter (HOSPITAL_COMMUNITY): Payer: Self-pay | Admitting: Emergency Medicine

## 2016-10-24 ENCOUNTER — Emergency Department (HOSPITAL_COMMUNITY): Payer: Medicare Other

## 2016-10-24 DIAGNOSIS — G9341 Metabolic encephalopathy: Secondary | ICD-10-CM | POA: Diagnosis present

## 2016-10-24 DIAGNOSIS — A4181 Sepsis due to Enterococcus: Secondary | ICD-10-CM | POA: Diagnosis not present

## 2016-10-24 DIAGNOSIS — G9340 Encephalopathy, unspecified: Secondary | ICD-10-CM | POA: Diagnosis present

## 2016-10-24 DIAGNOSIS — R6521 Severe sepsis with septic shock: Secondary | ICD-10-CM | POA: Diagnosis present

## 2016-10-24 DIAGNOSIS — T83511A Infection and inflammatory reaction due to indwelling urethral catheter, initial encounter: Principal | ICD-10-CM | POA: Diagnosis present

## 2016-10-24 DIAGNOSIS — Z66 Do not resuscitate: Secondary | ICD-10-CM | POA: Diagnosis present

## 2016-10-24 DIAGNOSIS — C61 Malignant neoplasm of prostate: Secondary | ICD-10-CM | POA: Diagnosis present

## 2016-10-24 DIAGNOSIS — M109 Gout, unspecified: Secondary | ICD-10-CM | POA: Diagnosis present

## 2016-10-24 DIAGNOSIS — R739 Hyperglycemia, unspecified: Secondary | ICD-10-CM | POA: Diagnosis present

## 2016-10-24 DIAGNOSIS — Z880 Allergy status to penicillin: Secondary | ICD-10-CM | POA: Diagnosis not present

## 2016-10-24 DIAGNOSIS — Z87440 Personal history of urinary (tract) infections: Secondary | ICD-10-CM

## 2016-10-24 DIAGNOSIS — R319 Hematuria, unspecified: Secondary | ICD-10-CM | POA: Diagnosis present

## 2016-10-24 DIAGNOSIS — G309 Alzheimer's disease, unspecified: Secondary | ICD-10-CM | POA: Diagnosis present

## 2016-10-24 DIAGNOSIS — E876 Hypokalemia: Secondary | ICD-10-CM | POA: Diagnosis present

## 2016-10-24 DIAGNOSIS — D696 Thrombocytopenia, unspecified: Secondary | ICD-10-CM | POA: Diagnosis present

## 2016-10-24 DIAGNOSIS — R7881 Bacteremia: Secondary | ICD-10-CM

## 2016-10-24 DIAGNOSIS — R339 Retention of urine, unspecified: Secondary | ICD-10-CM | POA: Diagnosis present

## 2016-10-24 DIAGNOSIS — Z923 Personal history of irradiation: Secondary | ICD-10-CM | POA: Diagnosis not present

## 2016-10-24 DIAGNOSIS — N390 Urinary tract infection, site not specified: Secondary | ICD-10-CM | POA: Diagnosis present

## 2016-10-24 DIAGNOSIS — F0390 Unspecified dementia without behavioral disturbance: Secondary | ICD-10-CM | POA: Diagnosis not present

## 2016-10-24 DIAGNOSIS — D630 Anemia in neoplastic disease: Secondary | ICD-10-CM | POA: Diagnosis present

## 2016-10-24 DIAGNOSIS — B9689 Other specified bacterial agents as the cause of diseases classified elsewhere: Secondary | ICD-10-CM | POA: Diagnosis present

## 2016-10-24 DIAGNOSIS — R310 Gross hematuria: Secondary | ICD-10-CM | POA: Diagnosis present

## 2016-10-24 DIAGNOSIS — B952 Enterococcus as the cause of diseases classified elsewhere: Secondary | ICD-10-CM | POA: Diagnosis present

## 2016-10-24 DIAGNOSIS — Z87891 Personal history of nicotine dependence: Secondary | ICD-10-CM

## 2016-10-24 DIAGNOSIS — A419 Sepsis, unspecified organism: Secondary | ICD-10-CM

## 2016-10-24 DIAGNOSIS — Y846 Urinary catheterization as the cause of abnormal reaction of the patient, or of later complication, without mention of misadventure at the time of the procedure: Secondary | ICD-10-CM | POA: Diagnosis present

## 2016-10-24 DIAGNOSIS — N179 Acute kidney failure, unspecified: Secondary | ICD-10-CM | POA: Diagnosis present

## 2016-10-24 DIAGNOSIS — R6520 Severe sepsis without septic shock: Secondary | ICD-10-CM

## 2016-10-24 DIAGNOSIS — F0280 Dementia in other diseases classified elsewhere without behavioral disturbance: Secondary | ICD-10-CM | POA: Diagnosis present

## 2016-10-24 DIAGNOSIS — Z818 Family history of other mental and behavioral disorders: Secondary | ICD-10-CM | POA: Diagnosis not present

## 2016-10-24 DIAGNOSIS — A4159 Other Gram-negative sepsis: Secondary | ICD-10-CM | POA: Diagnosis present

## 2016-10-24 DIAGNOSIS — D649 Anemia, unspecified: Secondary | ICD-10-CM | POA: Diagnosis present

## 2016-10-24 HISTORY — DX: Gout, unspecified: M10.9

## 2016-10-24 LAB — BASIC METABOLIC PANEL
Anion gap: 11 (ref 5–15)
BUN: 15 mg/dL (ref 6–20)
CO2: 24 mmol/L (ref 22–32)
CREATININE: 1.38 mg/dL — AB (ref 0.61–1.24)
Calcium: 8.8 mg/dL — ABNORMAL LOW (ref 8.9–10.3)
Chloride: 107 mmol/L (ref 101–111)
GFR, EST AFRICAN AMERICAN: 55 mL/min — AB (ref >60)
GFR, EST NON AFRICAN AMERICAN: 47 mL/min — AB (ref >60)
Glucose, Bld: 142 mg/dL — ABNORMAL HIGH (ref 65–99)
POTASSIUM: 3.4 mmol/L — AB (ref 3.5–5.1)
SODIUM: 142 mmol/L (ref 135–145)

## 2016-10-24 LAB — I-STAT CG4 LACTIC ACID, ED: Lactic Acid, Venous: 6.39 mmol/L (ref 0.5–1.9)

## 2016-10-24 LAB — CBC WITH DIFFERENTIAL/PLATELET
BASOS PCT: 0 %
Basophils Absolute: 0 10*3/uL (ref 0.0–0.1)
EOS ABS: 0 10*3/uL (ref 0.0–0.7)
EOS PCT: 1 %
HCT: 35.2 % — ABNORMAL LOW (ref 39.0–52.0)
Hemoglobin: 11.9 g/dL — ABNORMAL LOW (ref 13.0–17.0)
LYMPHS ABS: 0.1 10*3/uL — AB (ref 0.7–4.0)
Lymphocytes Relative: 2 %
MCH: 31.1 pg (ref 26.0–34.0)
MCHC: 33.8 g/dL (ref 30.0–36.0)
MCV: 91.9 fL (ref 78.0–100.0)
Monocytes Absolute: 0 10*3/uL — ABNORMAL LOW (ref 0.1–1.0)
Monocytes Relative: 0 %
NEUTROS PCT: 97 %
Neutro Abs: 3.8 10*3/uL (ref 1.7–7.7)
PLATELETS: 110 10*3/uL — AB (ref 150–400)
RBC: 3.83 MIL/uL — AB (ref 4.22–5.81)
RDW: 14.1 % (ref 11.5–15.5)
WBC: 3.9 10*3/uL — AB (ref 4.0–10.5)

## 2016-10-24 MED ORDER — SODIUM CHLORIDE 0.9 % IV BOLUS (SEPSIS)
1000.0000 mL | Freq: Once | INTRAVENOUS | Status: AC
  Administered 2016-10-24: 1000 mL via INTRAVENOUS

## 2016-10-24 MED ORDER — SODIUM CHLORIDE 0.9 % IV BOLUS (SEPSIS)
500.0000 mL | Freq: Once | INTRAVENOUS | Status: AC
  Administered 2016-10-25: 500 mL via INTRAVENOUS

## 2016-10-24 MED ORDER — DEXTROSE 5 % IV SOLN
1.0000 g | Freq: Once | INTRAVENOUS | Status: AC
  Administered 2016-10-25: 1 g via INTRAVENOUS
  Filled 2016-10-24: qty 1

## 2016-10-24 MED ORDER — LEVOFLOXACIN IN D5W 750 MG/150ML IV SOLN
750.0000 mg | Freq: Once | INTRAVENOUS | Status: AC
  Administered 2016-10-24: 750 mg via INTRAVENOUS
  Filled 2016-10-24: qty 150

## 2016-10-24 MED ORDER — ACETAMINOPHEN 325 MG PO TABS
650.0000 mg | ORAL_TABLET | Freq: Once | ORAL | Status: AC
  Administered 2016-10-25: 650 mg via ORAL
  Filled 2016-10-24: qty 2

## 2016-10-24 NOTE — ED Notes (Signed)
Second set of blood cultures obtained from right antecubital 26ml each bottle.

## 2016-10-24 NOTE — ED Notes (Signed)
Bed: VD47 Expected date: 10/24/16 Expected time:  Means of arrival:  Comments: Blood in cath bag

## 2016-10-24 NOTE — ED Triage Notes (Signed)
Pt from home after family called and reports that pt has new onset of blood in catheter bag. Pt has hx of prostate CA. Family advises that pt is scheduled to receive a suprapubic catheter at [facility]. Family unclear with EMS if pt is receiving current cancer tx. Pt also has hx of Alzheimer's. Family reports that pt has been "pulling on catheter because he forgets he has it." Family reports pt is mentating at his baseline. Pt in NAD

## 2016-10-24 NOTE — Progress Notes (Signed)
A consult was received from an ED physician for Levofloxacin, aztreonam per pharmacy dosing.  The patient's profile has been reviewed for ht/wt/allergies/indication/available labs.   A one time order has been placed for Levofloxacin 750mg  iv x1, aztreonam 1gm iv x1.  Further antibiotics/pharmacy consults should be ordered by admitting physician if indicated.                       Thank you,

## 2016-10-24 NOTE — ED Provider Notes (Signed)
[place] DEPT Provider Note   CSN: [id number] Arrival date & time: 10/24/16  1803     History   Chief Complaint Chief Complaint  Patient presents with  . Hematuria    HPI Ricky Boyd is a 76 y.o. male w PMHx dementia, prostate cancer, chronic indwelling Foley catheter, Alzheimer's dementia, interstitial lung disease, presented to the ED with acute onset of hematuria. Patient accompanied by his caregiver, who states she arrived to his house today around 1630 and found him in the restroom holding his Foley bag with blood in the toilet and on his legs. She states he was home alone for about 15 minutes between his family member leaving and her arrival. She and his family state he is currently at his mental baseline, and report he has an appointment his urologist at [facility] this Thursday for placement of a suprapubic catheter.  Patient was seen in the ED on 10/17/2016 with urinary retention and hematuria. A new Foley was placed during this visit.  Pt denies pain or any other symptoms when asked.  LEVEL 5 CAVEAT DUE TO DEMENTIA.  The history is provided by a caregiver. The history is limited by the condition of the patient.    Past Medical History:  Diagnosis Date  . Arthritis   . Dementia 09/16/2016  . Gout    usually knees, occasionally feet  . Hearing aid worn   . Prostate cancer [facility])     Patient Active Problem List   Diagnosis Date Noted  . Hematuria 10/24/2016  . Urinary retention 10/08/2016  . Acute encephalopathy 09/16/2016  . Dementia 09/16/2016  . Urinary tract infection without hematuria   . Dehydration   . Malignant neoplasm of prostate ([place]) 04/23/2016  . Erectile dysfunction 07/23/2015  . 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 Elevated PSA, less than 10 ng/ml 06/27/2012  . Impotence 01/28/2012  . GOUT 12/26/2008  . MEMORY LOSS 12/26/2008  . 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 History of colonic polyps 10/28/2006  . INTERSTITIAL LUNG  DISEASE 11/12/2004    Past Surgical History:  Procedure Laterality Date  . COLONOSCOPY    . HEMORROIDECTOMY    . PROSTATE BIOPSY    . TONSILLECTOMY AND ADENOIDECTOMY         Home Medications    Prior to Admission medications   Medication Sig Start Date End Date Taking? Authorizing Provider  acetaminophen (TYLENOL) 500 MG tablet Take 1,000 mg by mouth every 6 (six) hours as needed for mild pain.    [provider]  colchicine 0.6 MG tablet Take 1 tablet (0.6 mg total) by mouth daily. When having a gout attack, take 2 tablets in day 1 then daily for 6 more days 09/20/16 09/20/17  Caren Griffins, MD  Memantine HCl-Donepezil HCl (NAMZARIC) 28-10 MG CP24 Take 28 mg by mouth daily. 02/27/16   Lucille Passy, MD  QUEtiapine (SEROQUEL) 25 MG tablet Take 0.5 tablets (12.5 mg total) by mouth at bedtime. 09/22/16   Caren Griffins, MD  tamsulosin (FLOMAX) 0.4 MG CAPS capsule Take 1 capsule (0.4 mg total) by mouth daily after supper. 09/18/16   Caren Griffins, MD    Family History Family History  Problem Relation Age of Onset  . Diverticulitis Father   . Depression Brother   . Cancer Paternal Uncle   . Colon cancer Neg Hx     Social History Social History  Substance Use Topics  . Smoking status: Former Smoker  Packs/day: 0.25    Years: 10.00    Types: Cigarettes    Quit date: 02/23/1958  . Smokeless tobacco: Former Systems developer     Comment: quit 1963  . Alcohol use No     Comment: h/o occasional use     Allergies   Hydrocodone; Indocin [indomethacin]; Amoxicillin-pot clavulanate; and Erythromycin ethylsuccinate   Review of Systems Review of Systems  Unable to perform ROS: Dementia  Genitourinary: Positive for hematuria.     Physical Exam Updated Vital Signs BP 96/62   Pulse (!) 102   Temp (!) 103.9 F (39.9 C) (Rectal)   Resp (!) 22   Ht 5\' 9"  (1.753 m)   Wt 81.6 kg (180 lb)   SpO2 100%   BMI 26.58 kg/m   Physical Exam  Constitutional: He appears  well-developed and well-nourished.  Pt shivering, though not in distress  HENT:  Head: Normocephalic and atraumatic.  Eyes: Conjunctivae are normal.  Cardiovascular: Normal rate, regular rhythm, normal heart sounds and intact distal pulses.   Pulmonary/Chest: Effort normal and breath sounds normal. No respiratory distress. He has no wheezes. He has no rales.  Abdominal: Soft. Bowel sounds are normal. He exhibits no distension and no mass. There is no tenderness. There is no rebound and no guarding. No hernia.  Genitourinary:  Genitourinary Comments: Exam performed with RN chaperone present; foley catheter appears in place, with blood at urethral meatus.   Neurological: He is alert.  Skin: Skin is warm.  Psychiatric: He has a normal mood and affect. His behavior is normal.  Nursing note and vitals reviewed.    ED Treatments / Results  Labs (all labs ordered are listed, but only abnormal results are displayed) Labs Reviewed  CBC WITH DIFFERENTIAL/PLATELET - Abnormal; Notable for the following:       Result Value   WBC 3.9 (*)    RBC 3.83 (*)    Hemoglobin 11.9 (*)    HCT 35.2 (*)    Platelets 110 (*)    Lymphs Abs 0.1 (*)    Monocytes Absolute 0.0 (*)    All other components within normal limits  BASIC METABOLIC PANEL - Abnormal; Notable for the following:    Potassium 3.4 (*)    Glucose, Bld 142 (*)    Creatinine, Ser 1.38 (*)    Calcium 8.8 (*)    GFR calc non Af Amer 47 (*)    GFR calc Af Amer 55 (*)    All other components within normal limits  I-STAT CG4 LACTIC ACID, ED - Abnormal; Notable for the following:    Lactic Acid, Venous 6.39 (*)    All other components within normal limits  CULTURE, BLOOD (ROUTINE X 2)  CULTURE, BLOOD (ROUTINE X 2)  URINE CULTURE    EKG  EKG Interpretation None       Radiology Dg Chest Port 1 View  Result Date: 10/24/2016 CLINICAL DATA:  Fever.  Hematuria. EXAM: PORTABLE CHEST 1 VIEW COMPARISON:  09/16/2016 FINDINGS: Shallow  inspiration. No airspace consolidation. Mild unchanged interstitial coarsening. IMPRESSION: No consolidation or large effusion. Electronically Signed   By: Andreas Newport M.D.   On: 10/24/2016 22:41   Procedures CRITICAL CARE Performed by: [place] N Russo   Total critical care time: 30 minutes  Critical care time was exclusive of separately billable procedures and treating other patients.  Critical care was necessary to treat or prevent imminent or life-threatening deterioration.  Critical care was time spent personally by me on the following activities:  development of treatment plan with patient and/or surrogate as well as nursing, discussions with consultants, evaluation of patient's response to treatment, examination of patient, obtaining history from patient or surrogate, ordering and performing treatments and interventions, ordering and review of laboratory studies, ordering and review of radiographic studies, pulse oximetry and re-evaluation of patient's condition.   .Critical Care Performed by: RUSSO, Martinique N Authorized by: RUSSO, Martinique N   Critical care provider statement:    Critical care time (minutes):  30   Critical care time was exclusive of:  Separately billable procedures and treating other patients   Critical care was necessary to treat or prevent imminent or life-threatening deterioration of the following conditions:  Sepsis   Critical care was time spent personally by me on the following activities:  Development of treatment plan with patient or surrogate, discussions with consultants, examination of patient, obtaining history from patient or surrogate, review of old charts, re-evaluation of patient's condition, ordering and review of radiographic studies, ordering and review of laboratory studies and ordering and performing treatments and interventions   I assumed direction of critical care for this patient from another provider in my specialty: no      Medications  Ordered in ED Medications  sodium chloride 0.9 % bolus 1,000 mL (0 mLs Intravenous Stopped 10/24/16 2332)    And  sodium chloride 0.9 % bolus 500 mL (not administered)  aztreonam (AZACTAM) 1 g in dextrose 5 % 50 mL IVPB (not administered)  acetaminophen (TYLENOL) tablet 650 mg (not administered)  sodium chloride 0.9 % bolus 1,000 mL (0 mLs Intravenous Stopped 10/24/16 2335)  levofloxacin (LEVAQUIN) IVPB 750 mg (0 mg Intravenous Stopped 10/24/16 2351)     Initial Impression / Assessment and Plan / ED Course  I have reviewed the triage vital signs and the nursing notes.  Pertinent labs & imaging results that were available during my care of the patient were reviewed by me and considered in my medical decision making (see chart for details).  Clinical Course as of Oct 24 2353  Sat Oct 24, 2016  2307 Spoke with Dr. Lorin Mercy, who is accepting pt for admission.  [JR]    Clinical Course User Index [JR] Russo, [place] N, PA-C    Pt presenting with blood in foley catheter bag and at urethral meatus. Foley flushing, however not drawing back fluid. Foley replaced with 3-way valve and flushed until clear. During ED course, pt became tachycardic and febrile, with tachypnea and slight hypotension. Lactic 6.39; showing pt in severe sepsis. Multiple liters of IVF given. WBC resulting at 3.9, BMP with elev Cr 1.38. Blood cultures x2 pending. Tylenol for fever. CXR neg for PNA. Levaquin and Aztreonam started for possible urinary source. Spoke with Dr. Lorin Mercy with [facility], who is accepting admission. Pt's son and brother in room at this time, discussed workup and plan with family who verbalizes understanding of plan and confirms pt is at his mental baseline.  Patient discussed with and seen by Dr. Alvino Chapel.  The patient appears reasonably stabilized for admission considering the current resources, flow, and capabilities available in the ED at this time, and I doubt any other [facility] requiring further  screening and/or treatment in the ED prior to admission.   Final Clinical Impressions(s) / ED Diagnoses   Final diagnoses:  Hematuria, unspecified type  Severe sepsis [facility])    New Prescriptions New Prescriptions   No medications on file     Russo, Martin[place] N, PA-C 10/24/16 [place],  Ovid Curd, MD 10/25/16 1779

## 2016-10-24 NOTE — ED Notes (Signed)
Attempted to flush foley.w/o success. Foley continues to drain blood but will not flush back. Dr Alvino Chapel and [place] notified. Leg bag removed and drainage bag replaced

## 2016-10-24 NOTE — H&P (Signed)
History and Physical    Ricky Boyd XKG:[id number] DOB: 01-09-1941 DOA: 10/24/2016  PCP: Lucille Passy, MD Consultants:  D[facility]t - urology; Tammi Klippel - rad onc Patient coming from: Home - lives with brother (recently); [facility]: [facility], [phone]  Chief Complaint: hematuria  HPI: Ricky Boyd is a 76 y.o. male with medical history significant of prostate cancer (see below), mild dementia, and gout presenting with gross hematuria.  About 4:30, his caregiver arrived and he was not wearing his catheter bag.  He had been wearing it at 4:15 when his son left.  There was blood around the commode where he had removed it.  She put the catheter bag back on and it filled with blood.  He seemed to fell well prior to that today.  Seemed to be finally getting over the UTI from his prior admission (7/25-31) and the last few days were his best days yet.  +dysuria but he has had ongoing discomfort.  Stopped radiation therapy due to urethral edema.  He was fine all day today.  No blood in his urine until this evening.  No fevers at home.  More confused than usual while in the ER only.    Prostate cancer diagnosed 4 years ago.  No treatment at that time.  About 6 months ago, there was another biopsy with worsening prostate cancer.  He was given Lupron and then started radioation therapy.  After week 7 he couldn't urinate at all, diarrhea was unmanagable. They stopped therapy at that time.  3 days later he had 1.5L retention and he has had catheter since.  3 days later he was hospitalized for a UTI.  They are not planning further prostate cancer treatment.   ED Course: Hematuria, fevers, chills.  Lacate 6.  Started empiric abx with Levaquin and Aztreonam for hospital-associated UTI with PCN allergy.  BP improved with IVF.  Code sepsis.  Review of Systems: As per HPI; otherwise review of systems reviewed and negative.   Ambulatory Status: Ambulates with a walker  Past Medical History:  Diagnosis Date  . Arthritis   .  Dementia 09/16/2016  . Gout    usually knees, occasionally feet  . Hearing aid worn   . Prostate cancer [facility])     Past Surgical History:  Procedure Laterality Date  . COLONOSCOPY    . HEMORROIDECTOMY    . PROSTATE BIOPSY    . TONSILLECTOMY AND ADENOIDECTOMY      Social History   Social History  . Marital status: Single    Spouse name: N/A  . Number of children: 3  . Years of education: Master's   Occupational History  . retired    Social History Main Topics  . Smoking status: Former Smoker    Packs/day: 0.25    Years: 10.00    Types: Cigarettes    Quit date: 02/23/1958  . Smokeless tobacco: Former Systems developer     Comment: quit 1963  . Alcohol use No     Comment: h/o occasional use  . Drug use: No  . Sexual activity: Yes   Other Topics Concern  . Not on file   Social History Narrative   Desires CPR.      Would desire life support and and possibly feeding tube.      Patient currently lives alone- 2nd wife killed herself. Lives in one story house.    Patient is left handed.   Patient has a Scientist, water quality.     Allergies  Allergen Reactions  .  Hydrocodone Other (See Comments)    Reaction:  Hallucinations  . Indocin [Indomethacin] Other (See Comments)    Reaction:  Confusion   . Amoxicillin-Pot Clavulanate Other (See Comments)    Reaction:  Nightmares Has patient had a PCN reaction causing immediate rash, facial/tongue/throat swelling, SOB or lightheadedness with hypotension: No Has patient had a PCN reaction causing severe rash involving mucus membranes or skin necrosis: No Has patient had a PCN reaction that required hospitalization: No Has patient had a PCN reaction occurring within the last 10 years: Yes If all of the above answers are "NO", then may proceed with Cephalosporin use.  . Erythromycin Ethylsuccinate Other (See Comments)    Reaction:  Eye irritation     Family History  Problem Relation Age of Onset  . Diverticulitis Father   . Depression Brother    . Cancer Paternal Uncle   . Colon cancer Neg Hx     Prior to Admission medications   Medication Sig Start Date End Date Taking? Authorizing Provider  acetaminophen (TYLENOL) 500 MG tablet Take 1,000 mg by mouth every 6 (six) hours as needed for mild pain.    [provider]  colchicine 0.6 MG tablet Take 1 tablet (0.6 mg total) by mouth daily. When having a gout attack, take 2 tablets in day 1 then daily for 6 more days 09/20/16 09/20/17  Caren Griffins, MD  Memantine HCl-Donepezil HCl (NAMZARIC) 28-10 MG CP24 Take 28 mg by mouth daily. 02/27/16   Lucille Passy, MD  QUEtiapine (SEROQUEL) 25 MG tablet Take 0.5 tablets (12.5 mg total) by mouth at bedtime. 09/22/16   Caren Griffins, MD  tamsulosin (FLOMAX) 0.4 MG CAPS capsule Take 1 capsule (0.4 mg total) by mouth daily after supper. 09/18/16   Caren Griffins, MD    Physical Exam: Vitals:   10/24/16 2227 10/24/16 2230 10/24/16 2300 10/24/16 2330  BP: 110/68 95/66 124/81 96/62  Pulse: (!) 121 (!) 109 (!) 107 (!) 102  Resp: (!) 23 (!) 21 (!) 23 (!) 22  Temp: (!) 103.9 F (39.9 C)     TempSrc: Rectal     SpO2: 99% 98% 97% 100%  Weight:      Height:         General: Appears calm and comfortable and is NAD.  He is charming albeit somewhat confused.  Warm to touch. Eyes:  PERRL, EOMI, normal lids, iris ENT:  grossly normal hearing, lips & tongue, mmm; appropriate dentition Neck:  no LAD, masses or thyromegaly; no carotid bruits Cardiovascular:  Tachycardia, no m/r/g. No LE edema.  Respiratory:   CTA bilaterally with no wheezes/rales/rhonchi.  Normal respiratory effort. Abdomen:  soft, NT (possible mild suprapubic tenderness), ND, NABS Skin:  no rash or induration seen on limited exam Musculoskeletal:  grossly normal tone BUE/BLE, good ROM, no bony abnormality Lower extremity:  No LE edema.  Limited foot exam with no ulcerations.  2+ distal pulses. Psychiatric:  grossly normal mood and affect, speech slow and somewhat  inappropriate (sometimes nonsensical and other times he is more clear in his thoughts), AOx2 Neurologic:  CN 2-12 grossly intact, moves all extremities in coordinated fashion, sensation intact    Radiological Exams on Admission: Dg Chest Port 1 View  Result Date: 10/24/2016 CLINICAL DATA:  Fever.  Hematuria. EXAM: PORTABLE CHEST 1 VIEW COMPARISON:  09/16/2016 FINDINGS: Shallow inspiration. No airspace consolidation. Mild unchanged interstitial coarsening. IMPRESSION: No consolidation or large effusion. Electronically Signed   By: Valerie Roys.D.  On: 10/24/2016 22:41    EKG: not done   Labs on Admission: I have personally reviewed the available labs and imaging studies at the time of the admission.  Pertinent labs:   Glucose 142 BUN 15/Creatinine 1.38/GFR 47 Lactate 6.39 Hgb 11.9 Platelets 110 Urine culture from 8/25 + Enterobacter resistant to Cefazolin and Nitrofurantoin Urine culture from 7/25 + for E coli and Klebsiella Pneumoniae resistant to Ampicillin and Nitrofurantoin   Assessment/Plan Principal Problem:   Sepsis secondary to UTI [facility]) Active Problems:   Malignant neoplasm of prostate ([facility])   Dementia   Urinary retention   Hematuria   Hyperglycemia   Thrombocytopenia ([facility])   Sepsis secondary to UTI, with hematuria -Fever, tachycardia with markedly elevated lactate and borderline hypotension -While awaiting blood cultures, this appears to be a preseptic condition. -Sepsis protocol initiated -Suspect urinary source despite NO UA today - he does have a recent urine culture + for Enterobacter -Blood and urine cultures pending -Will admit to SDU with telemetry and continue to monitor -Treat with IV Levaquin and Azrteonam for healthcare-associated UTI with PCN allergy -Will trend lactate to ensure improvement and check procalcitonin -Family would like to request urology consult in the AM, as they think that he would benefit from a suprapubic cath  instead -Continuous bladder irrigation for the hematuria  Dementia -Possible mild metabolic encephalopathy in addition, which is thought to be related to his infection/sepsis. -Continue Aricept/Namenda and Seroquel.  Urinary retention -As above, patient has Foley - but would prefer a suprapubic cath -Suggest urology consult in AM -Continue foley with CBI  Prostate cancer  -No longer undergoing therapy or interested in pursuing in the future  Hyperglycemia -May be stress response -Will follow with fasting AM labs -It is unlikely that he will need acute or chronic treatment for this issue  Thrombocytopenia -Appears to be chronic but slightly worse than usual -Will trend -Lovenox for DVT prophylaxis will need to be held if platelets continue to drift down   DVT prophylaxis:  Lovenox  Code Status:  DNR - confirmed with patient/family Family Communication: Son, brother, and another family member were present throughout evaluation  Disposition Plan:  Home once clinically improved Consults called: Suggest urology in the AM  Admission status: Admit to SDU - It is my clinical opinion that admission to INPATIENT is reasonable and necessary because this patient will require at least 2 midnights in the hospital to treat this condition based on the medical complexity of the problems presented.  Given the aforementioned information, the predictability of an adverse outcome is felt to be significant.    Karmen Bongo MD [facility]  If note is complete, please contact covering daytime or nighttime physician. [email or web address] Password [email or web address]  10/25/2016, 12:54 AM

## 2016-10-25 DIAGNOSIS — R6520 Severe sepsis without septic shock: Secondary | ICD-10-CM

## 2016-10-25 DIAGNOSIS — R739 Hyperglycemia, unspecified: Secondary | ICD-10-CM | POA: Diagnosis present

## 2016-10-25 DIAGNOSIS — R319 Hematuria, unspecified: Secondary | ICD-10-CM

## 2016-10-25 DIAGNOSIS — N390 Urinary tract infection, site not specified: Secondary | ICD-10-CM

## 2016-10-25 DIAGNOSIS — A419 Sepsis, unspecified organism: Secondary | ICD-10-CM | POA: Diagnosis present

## 2016-10-25 DIAGNOSIS — D696 Thrombocytopenia, unspecified: Secondary | ICD-10-CM | POA: Diagnosis present

## 2016-10-25 DIAGNOSIS — C61 Malignant neoplasm of prostate: Secondary | ICD-10-CM

## 2016-10-25 LAB — BLOOD CULTURE ID PANEL (REFLEXED)
ACINETOBACTER BAUMANNII: NOT DETECTED
CANDIDA GLABRATA: NOT DETECTED
Candida albicans: NOT DETECTED
Candida krusei: NOT DETECTED
Candida parapsilosis: NOT DETECTED
Candida tropicalis: NOT DETECTED
Carbapenem resistance: NOT DETECTED
ENTEROCOCCUS SPECIES: NOT DETECTED
ESCHERICHIA COLI: NOT DETECTED
Enterobacter cloacae complex: DETECTED — AB
Enterobacteriaceae species: DETECTED — AB
Haemophilus influenzae: NOT DETECTED
Klebsiella oxytoca: NOT DETECTED
Klebsiella pneumoniae: NOT DETECTED
LISTERIA MONOCYTOGENES: NOT DETECTED
NEISSERIA MENINGITIDIS: NOT DETECTED
Proteus species: NOT DETECTED
Pseudomonas aeruginosa: NOT DETECTED
SERRATIA MARCESCENS: NOT DETECTED
STREPTOCOCCUS AGALACTIAE: NOT DETECTED
STREPTOCOCCUS SPECIES: NOT DETECTED
Staphylococcus aureus (BCID): NOT DETECTED
Staphylococcus species: NOT DETECTED
Streptococcus pneumoniae: NOT DETECTED
Streptococcus pyogenes: NOT DETECTED
VANCOMYCIN RESISTANCE: NOT DETECTED

## 2016-10-25 LAB — URINALYSIS, ROUTINE W REFLEX MICROSCOPIC
BILIRUBIN URINE: NEGATIVE
Glucose, UA: NEGATIVE mg/dL
Ketones, ur: NEGATIVE mg/dL
Nitrite: NEGATIVE
PH: 5 (ref 5.0–8.0)
Protein, ur: NEGATIVE mg/dL
SPECIFIC GRAVITY, URINE: 1.005 (ref 1.005–1.030)
Squamous Epithelial / LPF: NONE SEEN

## 2016-10-25 LAB — LACTIC ACID, PLASMA
LACTIC ACID, VENOUS: 5.4 mmol/L — AB (ref 0.5–1.9)
Lactic Acid, Venous: 3.5 mmol/L (ref 0.5–1.9)
Lactic Acid, Venous: 3 mmol/L (ref 0.5–1.9)
Lactic Acid, Venous: 3 mmol/L (ref 0.5–1.9)
Lactic Acid, Venous: 2.7 mmol/L (ref 0.5–1.9)

## 2016-10-25 LAB — APTT: aPTT: 34 seconds (ref 24–36)

## 2016-10-25 LAB — BASIC METABOLIC PANEL
ANION GAP: 9 (ref 5–15)
BUN: 17 mg/dL (ref 6–20)
CALCIUM: 7.7 mg/dL — AB (ref 8.9–10.3)
CO2: 20 mmol/L — AB (ref 22–32)
Chloride: 110 mmol/L (ref 101–111)
Creatinine, Ser: 1.37 mg/dL — ABNORMAL HIGH (ref 0.61–1.24)
GFR calc Af Amer: 55 mL/min — ABNORMAL LOW (ref >60)
GFR calc non Af Amer: 48 mL/min — ABNORMAL LOW (ref >60)
Glucose, Bld: 134 mg/dL — ABNORMAL HIGH (ref 65–99)
Potassium: 3 mmol/L — ABNORMAL LOW (ref 3.5–5.1)
Sodium: 139 mmol/L (ref 135–145)

## 2016-10-25 LAB — PROCALCITONIN: Procalcitonin: 51.32 ng/mL

## 2016-10-25 LAB — MAGNESIUM: Magnesium: 1.1 mg/dL — ABNORMAL LOW (ref 1.7–2.4)

## 2016-10-25 LAB — PROTIME-INR
INR: 1.28
Prothrombin Time: 15.9 seconds — ABNORMAL HIGH (ref 11.4–15.2)

## 2016-10-25 LAB — MRSA PCR SCREENING: MRSA BY PCR: NEGATIVE

## 2016-10-25 MED ORDER — COLCHICINE 0.6 MG PO TABS
0.6000 mg | ORAL_TABLET | Freq: Every day | ORAL | Status: DC
  Administered 2016-10-25 – 2016-11-02 (×9): 0.6 mg via ORAL
  Filled 2016-10-25 (×10): qty 1

## 2016-10-25 MED ORDER — POTASSIUM CHLORIDE CRYS ER 20 MEQ PO TBCR
40.0000 meq | EXTENDED_RELEASE_TABLET | Freq: Two times a day (BID) | ORAL | Status: AC
  Administered 2016-10-25 (×2): 40 meq via ORAL
  Filled 2016-10-25 (×2): qty 2

## 2016-10-25 MED ORDER — SODIUM CHLORIDE 0.9 % IV BOLUS (SEPSIS)
1000.0000 mL | Freq: Once | INTRAVENOUS | Status: AC
  Administered 2016-10-25: 1000 mL via INTRAVENOUS

## 2016-10-25 MED ORDER — DONEPEZIL HCL 10 MG PO TABS
10.0000 mg | ORAL_TABLET | Freq: Every day | ORAL | Status: DC
  Administered 2016-10-25 – 2016-11-01 (×9): 10 mg via ORAL
  Filled 2016-10-25 (×9): qty 1

## 2016-10-25 MED ORDER — SODIUM CHLORIDE 0.9 % IV SOLN
INTRAVENOUS | Status: DC
  Administered 2016-10-25 – 2016-10-31 (×14): via INTRAVENOUS

## 2016-10-25 MED ORDER — ONDANSETRON HCL 4 MG/2ML IJ SOLN
4.0000 mg | Freq: Four times a day (QID) | INTRAMUSCULAR | Status: DC | PRN
Start: 2016-10-25 — End: 2016-11-02

## 2016-10-25 MED ORDER — MEMANTINE HCL ER 28 MG PO CP24
28.0000 mg | ORAL_CAPSULE | Freq: Every day | ORAL | Status: DC
  Administered 2016-10-25 – 2016-11-02 (×9): 28 mg via ORAL
  Filled 2016-10-25 (×9): qty 1

## 2016-10-25 MED ORDER — DEXTROSE 5 % IV SOLN
1.0000 g | Freq: Three times a day (TID) | INTRAVENOUS | Status: DC
  Administered 2016-10-25: 1 g via INTRAVENOUS
  Filled 2016-10-25 (×2): qty 1

## 2016-10-25 MED ORDER — SODIUM CHLORIDE 0.9 % IV BOLUS (SEPSIS)
500.0000 mL | Freq: Once | INTRAVENOUS | Status: AC
  Administered 2016-10-25: 500 mL via INTRAVENOUS

## 2016-10-25 MED ORDER — MEMANTINE HCL-DONEPEZIL HCL ER 28-10 MG PO CP24: 1.0000 | ORAL_CAPSULE | Freq: Every day | ORAL | Status: DC

## 2016-10-25 MED ORDER — ONDANSETRON HCL 4 MG PO TABS: 4.0000 mg | ORAL_TABLET | Freq: Four times a day (QID) | ORAL | Status: DC | PRN

## 2016-10-25 MED ORDER — DEXTROSE 5 % IV SOLN
1.0000 g | Freq: Two times a day (BID) | INTRAVENOUS | Status: DC
  Administered 2016-10-25 – 2016-10-26 (×3): 1 g via INTRAVENOUS
  Filled 2016-10-25 (×3): qty 1

## 2016-10-25 MED ORDER — HYDROCORTISONE NA SUCCINATE PF 100 MG IJ SOLR
100.0000 mg | Freq: Three times a day (TID) | INTRAMUSCULAR | Status: DC
  Administered 2016-10-25 – 2016-10-27 (×6): 100 mg via INTRAVENOUS
  Filled 2016-10-25 (×6): qty 2

## 2016-10-25 MED ORDER — LEVOFLOXACIN IN D5W 500 MG/100ML IV SOLN: 500.0000 mg | INTRAVENOUS | Status: DC

## 2016-10-25 MED ORDER — ACETAMINOPHEN 650 MG RE SUPP: 650.0000 mg | Freq: Four times a day (QID) | RECTAL | Status: DC | PRN

## 2016-10-25 MED ORDER — QUETIAPINE FUMARATE 25 MG PO TABS
12.5000 mg | ORAL_TABLET | Freq: Every day | ORAL | Status: DC
  Administered 2016-10-25 – 2016-11-01 (×9): 12.5 mg via ORAL
  Filled 2016-10-25 (×9): qty 1

## 2016-10-25 MED ORDER — ACETAMINOPHEN 325 MG PO TABS: 650.0000 mg | ORAL_TABLET | Freq: Four times a day (QID) | ORAL | Status: DC | PRN

## 2016-10-25 MED ORDER — TAMSULOSIN HCL 0.4 MG PO CAPS
0.4000 mg | ORAL_CAPSULE | Freq: Every day | ORAL | Status: DC
  Administered 2016-10-25 – 2016-11-01 (×8): 0.4 mg via ORAL
  Filled 2016-10-25 (×8): qty 1

## 2016-10-25 MED ORDER — ENOXAPARIN SODIUM 40 MG/0.4ML ~~LOC~~ SOLN
40.0000 mg | Freq: Every day | SUBCUTANEOUS | Status: DC
  Administered 2016-10-25 – 2016-10-26 (×3): 40 mg via SUBCUTANEOUS
  Filled 2016-10-25 (×3): qty 0.4

## 2016-10-25 NOTE — Progress Notes (Signed)
CRITICAL VALUE ALERT  Critical Value:  5.4 lactic acid  Date & Time Notied:  10/25/16 @ 0230  Provider Notified: X. Blount, NP  Orders Received/Actions taken: NS bolus infusing

## 2016-10-25 NOTE — Progress Notes (Signed)
CRITICAL VALUE ALERT  Critical Value:  Lactic Acid 3.5  Date & Time Notied:  10/25/16 @ 0519  Provider Notified: X. Blount, NP  Orders Received/Actions taken: No new orders given.

## 2016-10-25 NOTE — Progress Notes (Addendum)
   10/25/16 0500  Vitals  BP (!) 77/44  MAP (mmHg) (!) 52   [facility] NP on call (X. Blount) notified of blood pressure after 500cc bolus completion & elevated lactic acid level.   0510-NP at bedside to assess patient. NP states that since patient is easily aroused and mentation appears to be at baseline, she will continue to monitor. No further orders given.

## 2016-10-25 NOTE — Progress Notes (Signed)
   10/25/16 0220  Vitals  BP (!) 76/49  MAP (mmHg) (!) 53  Pulse Rate (!) 103   [facility] NP on call Jeannette Corpus, NP notified of blood pressure. Order given for 500cc NS bolus. Will continue to monitor.

## 2016-10-25 NOTE — Progress Notes (Signed)
Pharmacy Antibiotic Note  Kinsler Soeder is a 76 y.o. male admitted on 10/24/2016 with UTI.  Pharmacy has been consulted for Levofloxacin, aztreonam dosing.  Plan: Levofloxacin 750mg  iv x1, then 500mg  iv q24hr Aztreonam 1gm iv q8hr   Height: 5\' 9"  (175.3 cm) Weight: 180 lb (81.6 kg) IBW/kg (Calculated) : 70.7  Temp (24hrs), Avg:101.3 F (38.5 C), Min:97.5 F (36.4 C), Max:103.9 F (39.9 C)   Recent Labs Lab 10/24/16 2143 10/24/16 2201  WBC 3.9*  --   CREATININE 1.38*  --   LATICACIDVEN  --  6.39*    Estimated Creatinine Clearance: 44.1 mL/min (A) (by C-G formula based on SCr of 1.38 mg/dL (H)).    Allergies  Allergen Reactions  . Hydrocodone Other (See Comments)    Reaction:  Hallucinations  . Indocin [Indomethacin] Other (See Comments)    Reaction:  Confusion   . Amoxicillin-Pot Clavulanate Other (See Comments)    Reaction:  Nightmares Has patient had a PCN reaction causing immediate rash, facial/tongue/throat swelling, SOB or lightheadedness with hypotension: No Has patient had a PCN reaction causing severe rash involving mucus membranes or skin necrosis: No Has patient had a PCN reaction that required hospitalization: No Has patient had a PCN reaction occurring within the last 10 years: Yes If all of the above answers are "NO", then may proceed with Cephalosporin use.  . Erythromycin Ethylsuccinate Other (See Comments)    Reaction:  Eye irritation     Antimicrobials this admission: Levofloxacin 10/24/2016 >> Aztreonam 10/24/2016 >>  Dose adjustments this admission: -  Microbiology results: pending  Thank you for allowing pharmacy to be a part of this patient's care.  Nani Skillern Crowford 10/25/2016 12:56 AM

## 2016-10-25 NOTE — Progress Notes (Signed)
PROGRESS NOTE    Ricky Boyd  ION:[id number] DOB: 03-21-1940 DOA: 10/24/2016 PCP: Lucille Passy, MD    Brief Narrative:  76 y.o. male with medical history significant of prostate cancer (see below), mild dementia, and gout presenting with gross hematuria.  About 4:30, his caregiver arrived and he was not wearing his catheter bag.  He had been wearing it at 4:15 when his son left.  There was blood around the commode where he had removed it.  She put the catheter bag back on and it filled with blood.  He seemed to fell well prior to that today.  Seemed to be finally getting over the UTI from his prior admission (7/25-31) and the last few days were his best days yet.  +dysuria but he has had ongoing discomfort.  Stopped radiation therapy due to urethral edema.  He was fine all day today.  No blood in his urine until this evening.  No fevers at home.  More confused than usual while in the ER only.    Prostate cancer diagnosed 4 years ago.  No treatment at that time.  About 6 months ago, there was another biopsy with worsening prostate cancer.  He was given Lupron and then started radioation therapy.  After week 7 he couldn't urinate at all, diarrhea was unmanagable. They stopped therapy at that time.  3 days later he had 1.5L retention and he has had catheter since.  3 days later he was hospitalized for a UTI.  They are not planning further prostate cancer treatment.  Assessment & Plan:   Principal Problem:   Sepsis secondary to UTI [facility]) Active Problems:   Malignant neoplasm of prostate ([place])   Dementia   Urinary retention   Hematuria   Hyperglycemia   Thrombocytopenia (HCC)  Severe sepsis with septic shock secondary to UTI, with hematuria -Pt febrile, hypotensive, leukocytosis, with elevated lactate -Suspect urinary source despite NO UA today - he does have a recent urine culture + for Enterobacter -Blood and urine cultures pending -Will admit to SDU with telemetry and continue to  monitor -Patient continued on cefepime -Lactate gradually improving with IVF -Hematuria appears much improved -Hypotensive currently, continue to receive IVF boluses. May need stress dosed steroids if BP does not respond to IVF  Dementia -Possible mild metabolic encephalopathy in addition, which is thought to be related to his infection/sepsis. -Patient is continued Aricept/Namenda and Seroquel.  Urinary retention -As above, patient has Foley - but would prefer a suprapubic cath -Stable with indwelling foley cath -When above sepsis/UTI stable, would discuss with Urology at that time  Prostate cancer  -No longer undergoing therapy or interested in pursuing in the future -Stable  Hyperglycemia -May be stress response -Continue to monitor via basic labs  Thrombocytopenia -Appears to be chronic but slightly worse than usual -suspect worsening related to bone marrow suppression secondary to severe sepsis -continue to follow -Lovenox for DVT prophylaxis will need to be held if platelets continue to drift down  DVT prophylaxis: Lovenox subQ Code Status: DNR Family Communication: Pt in room, caregiver at bedside Disposition Plan: Uncertain at this time  Consultants:     Procedures:     Antimicrobials: Anti-infectives    Start     Dose/Rate Route Frequency Ordered Stop   10/25/16 2200  levofloxacin (LEVAQUIN) IVPB 500 mg     500 mg 100 mL/hr over 60 Minutes Intravenous Every 24 hours 10/25/16 0059     10/25/16 0600  aztreonam (AZACTAM) 1 g in dextrose  5 % 50 mL IVPB     1 g 100 mL/hr over 30 Minutes Intravenous Every 8 hours 10/25/16 0059     10/24/16 2230  aztreonam (AZACTAM) 1 g in dextrose 5 % 50 mL IVPB     1 g 100 mL/hr over 30 Minutes Intravenous  Once 10/24/16 2218 10/25/16 0040   10/24/16 2215  levofloxacin (LEVAQUIN) IVPB 750 mg     750 mg 100 mL/hr over 90 Minutes Intravenous  Once 10/24/16 2206 10/25/16 0000       Subjective: Without  complaints  Objective: Vitals:   10/25/16 0505 10/25/16 0600 10/25/16 0700 10/25/16 0800  BP: (!) 81/44 (!) 77/48 (!) 73/38 (!) 69/39  Pulse: 91 88 83 82  Resp: 19 (!) 21 (!) 22 (!) 21  Temp:    98.3 F (36.8 C)  TempSrc:    Oral  SpO2: 98% 98% 98% 98%  Weight:      Height:        Intake/Output Summary (Last 24 hours) at 10/25/16 0855 Last data filed at 10/25/16 0800  Gross per 24 hour  Intake          3931.25 ml  Output             2300 ml  Net          1631.25 ml   Filed Weights   10/24/16 2144  Weight: 81.6 kg (180 lb)    Examination:  General exam: Appears calm and comfortable  Respiratory system: Clear to auscultation. Respiratory effort normal. Cardiovascular system: S1 & S2 heard, RRR Gastrointestinal system: Abdomen is nondistended, soft and nontender. No organomegaly or masses felt. Normal bowel sounds heard. Central nervous system: Alert and oriented. No focal neurological deficits. Extremities: Symmetric 5 x 5 power. Skin: No rashes, lesions Psychiatry: difficult to assess given baseline dementia  Data Reviewed: I have personally reviewed following labs and imaging studies  CBC:  Recent Labs Lab 10/24/16 2143 10/25/16 0350  WBC 3.9* 12.4*  NEUTROABS 3.8  --   HGB 11.9* 8.9*  HCT 35.2* 25.1*  MCV 91.9 90.9  PLT 110* 90*   Basic Metabolic Panel:  Recent Labs Lab 10/24/16 2143 10/25/16 0350  NA 142 139  K 3.4* 3.0*  CL 107 110  CO2 24 20*  GLUCOSE 142* 134*  BUN 15 17  CREATININE 1.38* 1.37*  CALCIUM 8.8* 7.7*   GFR: Estimated Creatinine Clearance: 44.4 mL/min (A) (by C-G formula based on SCr of 1.37 mg/dL (H)). Liver Function Tests: No results for input(s): AST, ALT, ALKPHOS, BILITOT, PROT, ALBUMIN in the last 168 hours. No results for input(s): LIPASE, AMYLASE in the last 168 hours. No results for input(s): AMMONIA in the last 168 hours. Coagulation Profile:  Recent Labs Lab 10/25/16 0117  INR 1.28   Cardiac Enzymes: No  results for input(s): CKTOTAL, CKMB, CKMBINDEX, TROPONINI in the last 168 hours. BNP (last 3 results) No results for input(s): PROBNP in the last 8760 hours. HbA1C: No results for input(s): HGBA1C in the last 72 hours. CBG: No results for input(s): GLUCAP in the last 168 hours. Lipid Profile: No results for input(s): CHOL, HDL, LDLCALC, TRIG, CHOLHDL, LDLDIRECT in the last 72 hours. Thyroid Function Tests: No results for input(s): TSH, T4TOTAL, FREET4, T3FREE, THYROIDAB in the last 72 hours. Anemia Panel: No results for input(s): VITAMINB12, FOLATE, FERRITIN, TIBC, IRON, RETICCTPCT in the last 72 hours. Sepsis Labs:  Recent Labs Lab 10/24/16 2201 10/25/16 0117 10/25/16 0350  PROCALCITON  --  51.32  --   LATICACIDVEN 6.39* 5.4* 3.5*    Recent Results (from the past 240 hour(s))  Urine culture     Status: Abnormal   Collection Time: 10/17/16  7:00 PM  Result Value Ref Range Status   Specimen Description URINE, RANDOM  Final   Special Requests NONE  Final   Culture >=100,000 COLONIES/mL ENTEROBACTER SPECIES (A)  Final   Report Status 10/20/2016 FINAL  Final   Organism ID, Bacteria ENTEROBACTER SPECIES (A)  Final      Susceptibility   Enterobacter species - MIC*    CEFAZOLIN >=64 RESISTANT Resistant     CEFTRIAXONE 8 SENSITIVE Sensitive     CIPROFLOXACIN <=0.25 SENSITIVE Sensitive     GENTAMICIN <=1 SENSITIVE Sensitive     IMIPENEM <=0.25 SENSITIVE Sensitive     NITROFURANTOIN 128 RESISTANT Resistant     TRIMETH/SULFA <=20 SENSITIVE Sensitive     PIP/TAZO 8 SENSITIVE Sensitive     * >=100,000 COLONIES/mL ENTEROBACTER SPECIES  MRSA PCR Screening     Status: None   Collection Time: 10/25/16 12:47 AM  Result Value Ref Range Status   MRSA by PCR NEGATIVE NEGATIVE Final    Comment:        The GeneXpert MRSA Assay (FDA approved for NASAL specimens only), is one component of a comprehensive MRSA colonization surveillance program. It is not intended to diagnose  MRSA infection nor to guide or monitor treatment for MRSA infections.      Radiology Studies: Dg Chest Port 1 View  Result Date: 10/24/2016 CLINICAL DATA:  Fever.  Hematuria. EXAM: PORTABLE CHEST 1 VIEW COMPARISON:  09/16/2016 FINDINGS: Shallow inspiration. No airspace consolidation. Mild unchanged interstitial coarsening. IMPRESSION: No consolidation or large effusion. Electronically Signed   By: Andreas Newport M.D.   On: 10/24/2016 22:41    Scheduled Meds: . colchicine  0.6 mg Oral Daily  . donepezil  10 mg Oral QHS  . enoxaparin (LOVENOX) injection  40 mg Subcutaneous QHS  . memantine  28 mg Oral Daily  . QUEtiapine  12.5 mg Oral QHS  . tamsulosin  0.4 mg Oral QPC supper   Continuous Infusions: . sodium chloride 125 mL/hr at 10/25/16 0600  . aztreonam Stopped (10/25/16 0647)  . levofloxacin (LEVAQUIN) IV       LOS: 1 day   Etsuko Dierolf, Orpah Melter, MD [facility] Pager [phone]  If 7PM-7AM, please contact night-coverage [email or web address] Password [email or web address] 10/25/2016, 8:55 AM

## 2016-10-25 NOTE — Progress Notes (Signed)
CRITICAL VALUE ALERT  Critical Value:  Lactic acid 2.7  Date & Time Notied:  10/25/16 @ 2017  Provider Notified: X. Blount, NP  Orders Received/Actions taken: Awaiting response.

## 2016-10-25 NOTE — Progress Notes (Signed)
Pharmacy Antibiotic Note  Ricky Boyd is a 76 y.o. male admitted on 10/24/2016 with UTI.  Pharmacy has been consulted for Levofloxacin, aztreonam dosing. Patients reaction to augmentin was nightmares.  Follow-up with Hospitalist and suggested alternative antibiotics as patient has tolerated ceftriaxone in past.  Patient with chronic indwelling foley catheter.  Recent antibiotics for recurrent UTI.  Switch to cefepime for empiric therapy as BP remains soft.   Plan:  Cefepime 1gm IV q12h - concern for more recent organisms and pseudomonas  Follow renal function and culture results  Adjust antibiotics based on cultures   Height: 5\' 9"  (175.3 cm) Weight: 180 lb (81.6 kg) IBW/kg (Calculated) : 70.7  Temp (24hrs), Avg:100 F (37.8 C), Min:97.5 F (36.4 C), Max:103.9 F (39.9 C)   Recent Labs Lab 10/24/16 2143 10/24/16 2201 10/25/16 0117 10/25/16 0350  WBC 3.9*  --   --  12.4*  CREATININE 1.38*  --   --  1.37*  LATICACIDVEN  --  6.39* 5.4* 3.5*    Estimated Creatinine Clearance: 44.4 mL/min (A) (by C-G formula based on SCr of 1.37 mg/dL (H)).    Allergies  Allergen Reactions  . Hydrocodone Other (See Comments)    Reaction:  Hallucinations  . Indocin [Indomethacin] Other (See Comments)    Reaction:  Confusion   . Amoxicillin-Pot Clavulanate Other (See Comments)    Reaction:  Nightmares Has patient had a PCN reaction causing immediate rash, facial/tongue/throat swelling, SOB or lightheadedness with hypotension: No Has patient had a PCN reaction causing severe rash involving mucus membranes or skin necrosis: No Has patient had a PCN reaction that required hospitalization: No Has patient had a PCN reaction occurring within the last 10 years: Yes If all of the above answers are "NO", then may proceed with Cephalosporin use.  . Erythromycin Ethylsuccinate Other (See Comments)    Reaction:  Eye irritation     Antimicrobials this admission: Levofloxacin 10/24/2016 >> 9/2 Aztreonam  10/24/2016 >> 9/2 9/2 cefepime >>  Dose adjustments this admission:   Microbiology results: 9/1 BCx:  9/1 UCx:  7/25 UCx: E. Coli (pan-susc) and K. Pneumoniae (R to amp, I to NTF) 8/25 UCx: enterobacter spp (R to cefazolin, NTF)   Thank you for allowing pharmacy to be a part of this patient's care.  Doreene Eland, PharmD, BCPS.   Pager: [phone] 10/25/2016 10:27 AM

## 2016-10-25 NOTE — Progress Notes (Signed)
PHARMACY - PHYSICIAN COMMUNICATION CRITICAL VALUE ALERT - BLOOD CULTURE IDENTIFICATION (BCID)  Results for orders placed or performed during the hospital encounter of 10/24/16  Blood Culture ID Panel (Reflexed) (Collected: 10/24/2016  9:59 PM)  Result Value Ref Range   Enterococcus species NOT DETECTED NOT DETECTED   Vancomycin resistance NOT DETECTED NOT DETECTED   Listeria monocytogenes NOT DETECTED NOT DETECTED   Staphylococcus species NOT DETECTED NOT DETECTED   Staphylococcus aureus NOT DETECTED NOT DETECTED   Streptococcus species NOT DETECTED NOT DETECTED   Streptococcus agalactiae NOT DETECTED NOT DETECTED   Streptococcus pneumoniae NOT DETECTED NOT DETECTED   Streptococcus pyogenes NOT DETECTED NOT DETECTED   Acinetobacter baumannii NOT DETECTED NOT DETECTED   Enterobacteriaceae species DETECTED (A) NOT DETECTED   Enterobacter cloacae complex DETECTED (A) NOT DETECTED   Escherichia coli NOT DETECTED NOT DETECTED   Klebsiella oxytoca NOT DETECTED NOT DETECTED   Klebsiella pneumoniae NOT DETECTED NOT DETECTED   Proteus species NOT DETECTED NOT DETECTED   Serratia marcescens NOT DETECTED NOT DETECTED   Carbapenem resistance NOT DETECTED NOT DETECTED   Haemophilus influenzae NOT DETECTED NOT DETECTED   Neisseria meningitidis NOT DETECTED NOT DETECTED   Pseudomonas aeruginosa NOT DETECTED NOT DETECTED   Candida albicans NOT DETECTED NOT DETECTED   Candida glabrata NOT DETECTED NOT DETECTED   Candida krusei NOT DETECTED NOT DETECTED   Candida parapsilosis NOT DETECTED NOT DETECTED   Candida tropicalis NOT DETECTED NOT DETECTED    Name of physician (or Provider) Contacted: Lovey Newcomer, NP  Changes to prescribed antibiotics required: No changes. Continue current Cefepime 1gm IV q12h. Will follow final sensitivities.  Everette Rank, PharmD 10/25/2016  8:30 PM

## 2016-10-26 LAB — LACTIC ACID, PLASMA
Lactic Acid, Venous: 2 mmol/L (ref 0.5–1.9)
Lactic Acid, Venous: 2.1 mmol/L (ref 0.5–1.9)

## 2016-10-26 LAB — CBC
HCT: 25.1 % — ABNORMAL LOW (ref 39.0–52.0)
HEMATOCRIT: 25.1 % — AB (ref 39.0–52.0)
HEMOGLOBIN: 8.9 g/dL — AB (ref 13.0–17.0)
Hemoglobin: 8.6 g/dL — ABNORMAL LOW (ref 13.0–17.0)
MCH: 32.2 pg (ref 26.0–34.0)
MCH: 31.2 pg (ref 26.0–34.0)
MCHC: 35.5 g/dL (ref 30.0–36.0)
MCHC: 34.3 g/dL (ref 30.0–36.0)
MCV: 90.9 fL (ref 78.0–100.0)
MCV: 90.9 fL (ref 78.0–100.0)
Platelets: 90 10*3/uL — ABNORMAL LOW (ref 150–400)
Platelets: 87 10*3/uL — ABNORMAL LOW (ref 150–400)
RBC: 2.76 MIL/uL — ABNORMAL LOW (ref 4.22–5.81)
RBC: 2.76 MIL/uL — AB (ref 4.22–5.81)
RDW: 14.2 % (ref 11.5–15.5)
RDW: 14.6 % (ref 11.5–15.5)
WBC: 12.4 10*3/uL — ABNORMAL HIGH (ref 4.0–10.5)
WBC: 14.3 10*3/uL — AB (ref 4.0–10.5)

## 2016-10-26 LAB — BASIC METABOLIC PANEL
Anion gap: 5 (ref 5–15)
BUN: 17 mg/dL (ref 6–20)
CHLORIDE: 117 mmol/L — AB (ref 101–111)
CO2: 22 mmol/L (ref 22–32)
Calcium: 7.9 mg/dL — ABNORMAL LOW (ref 8.9–10.3)
Creatinine, Ser: 1.03 mg/dL (ref 0.61–1.24)
GFR calc non Af Amer: >60 mL/min (ref >60)
Glucose, Bld: 139 mg/dL — ABNORMAL HIGH (ref 65–99)
POTASSIUM: 4.2 mmol/L (ref 3.5–5.1)
SODIUM: 144 mmol/L (ref 135–145)

## 2016-10-26 MED ORDER — SODIUM CHLORIDE 0.9 % IV BOLUS (SEPSIS)
1000.0000 mL | Freq: Once | INTRAVENOUS | Status: AC
  Administered 2016-10-26: 1000 mL via INTRAVENOUS

## 2016-10-26 MED ORDER — DEXTROSE 5 % IV SOLN
1.0000 g | Freq: Three times a day (TID) | INTRAVENOUS | Status: DC
  Administered 2016-10-26 – 2016-10-27 (×3): 1 g via INTRAVENOUS
  Filled 2016-10-26 (×4): qty 1

## 2016-10-26 NOTE — Care Management Note (Signed)
Case Management Note  Patient Details  Name: Ricky Boyd MRN: [id number] Date of Birth: 12/08/1940  Subjective/Objective:      uro sepsis              Action/Plan: Date:  October 26, 2016 Chart reviewed for concurrent status and case management needs. Will continue to follow patient progress. Discharge Planning: following for needs Expected discharge date: [id number] Velva Harman, BSN, [place], [place]  Expected Discharge Date:                  Expected Discharge Plan:  [place]  In-House Referral:  Clinical Social Work  Discharge planning Services  CM Consult  Post Acute Care Choice:    Choice offered to:     DME Arranged:    DME Agency:     HH Arranged:    [place] Agency:     Status of Service:  In process, will continue to follow  If discussed at Long Length of Stay Meetings, dates discussed:    Additional Comments:  Leeroy Cha, RN 10/26/2016, 9:02 AM

## 2016-10-26 NOTE — Progress Notes (Signed)
CRITICAL VALUE ALERT  Critical Value:  Lactic Acid 2.0  Date & Time Notied:  10/26/16 1045  Provider Notified: Wyline Copas, MD  Orders Received/Actions taken: Continue patient on NS @ 119mL/hr

## 2016-10-26 NOTE — Progress Notes (Signed)
CRITICAL VALUE ALERT  Critical Value:  Lactic Acid 2.1   Date & Time Notied:  10/26/16 1313  Provider Notified: Wyline Copas, MD  Orders Received/Actions taken: continue NS @ 125 mL/hr

## 2016-10-26 NOTE — Progress Notes (Signed)
Pharmacy Antibiotic Note  Ricky Boyd is a 76 y.o. male admitted on 10/24/2016 with UTI.  Pharmacy has been consulted for cefepime dosing. Abx D#2.   Afebrile, WBC up to 14.3 (solucortef). SCr down to 1.03.  BCID: enterobacter cloacae complex.    Plan: Increase cefepime to 1 gm IV q8h F/u cx results  Height: 5\' 9"  (175.3 cm) Weight: 180 lb (81.6 kg) IBW/kg (Calculated) : 70.7  Temp (24hrs), Avg:98.6 F (37 C), Min:97.9 F (36.6 C), Max:99.2 F (37.3 C)   Recent Labs Lab 10/24/16 2143  10/25/16 0117 10/25/16 0350 10/25/16 1229 10/25/16 1429 10/25/16 1839 10/26/16 0252  WBC 3.9*  --   --  12.4*  --   --   --  14.3*  CREATININE 1.38*  --   --  1.37*  --   --   --  1.03  LATICACIDVEN  --   < > 5.4* 3.5* 3.0* 3.0* 2.7*  --   < > = values in this interval not displayed.  Estimated Creatinine Clearance: 59.1 mL/min (by C-G formula based on SCr of 1.03 mg/dL).    Allergies  Allergen Reactions  . Hydrocodone Other (See Comments)    Reaction:  Hallucinations  . Indocin [Indomethacin] Other (See Comments)    Reaction:  Confusion   . Amoxicillin-Pot Clavulanate Other (See Comments)    Reaction:  Nightmares Has patient had a PCN reaction causing immediate rash, facial/tongue/throat swelling, SOB or lightheadedness with hypotension: No Has patient had a PCN reaction causing severe rash involving mucus membranes or skin necrosis: No Has patient had a PCN reaction that required hospitalization: No Has patient had a PCN reaction occurring within the last 10 years: Yes If all of the above answers are "NO", then may proceed with Cephalosporin use.  . Erythromycin Ethylsuccinate Other (See Comments)    Reaction:  Eye irritation   Antimicrobials this admission: 9/1 Levofloxacin >> 9/2 9/1 Aztreonam  >> 9/2 9/2 cefepime >>  Dose adjustments this admission: 9/3 increase cefepime 1 gm q8h from q12h  Microbiology results: 9/1 BCx: Enterobacter cloacae complex BCID 9/1 UCx: 9/2 MRSA  PCR neg 9/1 MSSA PCR neg  7/25 UCx: E. Coli (pan-susc) and K. Pneumoniae (R to amp, I to NTF) 8/25 UCx: enterobacter spp (R to cefazolin, NTF)   Thank you for allowing pharmacy to be a part of this patient's care. Eudelia Bunch, Pharm.D. [phone] 10/26/2016 10:16 AM

## 2016-10-26 NOTE — Progress Notes (Signed)
PROGRESS NOTE    Ricky Boyd  VVO:[id number] DOB: 12-22-1940 DOA: 10/24/2016 PCP: Lucille Passy, MD    Brief Narrative:  76 y.o. male with medical history significant of prostate cancer (see below), mild dementia, and gout presenting with gross hematuria.  About 4:30, his caregiver arrived and he was not wearing his catheter bag.  He had been wearing it at 4:15 when his son left.  There was blood around the commode where he had removed it.  She put the catheter bag back on and it filled with blood.  He seemed to fell well prior to that today.  Seemed to be finally getting over the UTI from his prior admission (7/25-31) and the last few days were his best days yet.  +dysuria but he has had ongoing discomfort.  Stopped radiation therapy due to urethral edema.  He was fine all day today.  No blood in his urine until this evening.  No fevers at home.  More confused than usual while in the ER only.    Prostate cancer diagnosed 4 years ago.  No treatment at that time.  About 6 months ago, there was another biopsy with worsening prostate cancer.  He was given Lupron and then started radioation therapy.  After week 7 he couldn't urinate at all, diarrhea was unmanagable. They stopped therapy at that time.  3 days later he had 1.5L retention and he has had catheter since.  3 days later he was hospitalized for a UTI.  They are not planning further prostate cancer treatment.  Assessment & Plan:   Principal Problem:   Sepsis secondary to UTI [facility]) Active Problems:   Malignant neoplasm of prostate ([place])   Dementia   Urinary retention   Hematuria   Hyperglycemia   Thrombocytopenia (HCC)  Severe sepsis with septic shock secondary to UTI, with hematuria -Pt febrile, hypotensive, leukocytosis, with elevated lactate -Suspect urinary source despite NO UA today - he does have a recent urine culture + for Enterobacter -Blood and urine cultures pending -Will admit to SDU with telemetry and continue to  monitor -Patient continued on cefepime -Lactate gradually improving with IVF -Hematuria appears much improved -BP improved with stress dosed steroids and IVF hydration  Dementia -Possible mild metabolic encephalopathy in addition, which is thought to be related to his infection/sepsis. -Patient is continued Aricept/Namenda and Seroquel. -Appears stable  Urinary retention -As above, patient has Foley - but would prefer a suprapubic cath -Stable with indwelling foley cath -Pt is [facility] by Urology at [facility]  Prostate cancer  -No longer undergoing therapy or interested in pursuing in the future -Appears stable at this time  Hyperglycemia -Suspect stress response -Glucose stable at this time  Thrombocytopenia -Appears to be chronic but slightly worse than usual -suspect worsening related to bone marrow suppression secondary to severe sepsis -continue to follow  -Lovenox for DVT prophylaxis will need to be held if platelets continue to drift down -No signs of bleeding  DVT prophylaxis: Lovenox subQ Code Status: DNR Family Communication: Pt in room, caregiver at bedside Disposition Plan: Uncertain at this time  Consultants:     Procedures:     Antimicrobials: Anti-infectives    Start     Dose/Rate Route Frequency Ordered Stop   10/26/16 1800  ceFEPIme (MAXIPIME) 1 g in dextrose 5 % 50 mL IVPB     1 g 100 mL/hr over 30 Minutes Intravenous Every 8 hours 10/26/16 1014     10/25/16 2200  levofloxacin (LEVAQUIN) IVPB 500 mg  Status:  Discontinued     500 mg 100 mL/hr over 60 Minutes Intravenous Every 24 hours 10/25/16 0059 10/25/16 1022   10/25/16 1200  ceFEPIme (MAXIPIME) 1 g in dextrose 5 % 50 mL IVPB  Status:  Discontinued     1 g 100 mL/hr over 30 Minutes Intravenous Every 12 hours 10/25/16 1022 10/26/16 1014   10/25/16 0600  aztreonam (AZACTAM) 1 g in dextrose 5 % 50 mL IVPB  Status:  Discontinued     1 g 100 mL/hr over 30 Minutes Intravenous Every 8 hours  10/25/16 0059 10/25/16 1022   10/24/16 2230  aztreonam (AZACTAM) 1 g in dextrose 5 % 50 mL IVPB     1 g 100 mL/hr over 30 Minutes Intravenous  Once 10/24/16 2218 10/25/16 0040   10/24/16 2215  levofloxacin (LEVAQUIN) IVPB 750 mg     750 mg 100 mL/hr over 90 Minutes Intravenous  Once 10/24/16 2206 10/25/16 0000      Subjective: Reports feeling better today  Objective: Vitals:   10/26/16 1242 10/26/16 1300 10/26/16 1400 10/26/16 1405  BP: 103/65 101/60 (!) 82/55 (!) 86/65  Pulse: 61 65 65 62  Resp: (!) 21 17 (!) 22 (!) 27  Temp:      TempSrc:      SpO2: 100% 99% 99% 100%  Weight:      Height:        Intake/Output Summary (Last 24 hours) at 10/26/16 1506 Last data filed at 10/26/16 1400  Gross per 24 hour  Intake             7050 ml  Output             6750 ml  Net              300 ml   Filed Weights   10/24/16 2144  Weight: 81.6 kg (180 lb)    Examination: General exam: Conversant, in no acute distress Respiratory system: normal chest rise, clear, no audible wheezing Cardiovascular system: regular rhythm, s1-s2 Gastrointestinal system: Nondistended, nontender, pos BS Central nervous system: No seizures, no tremors Extremities: No cyanosis, no joint deformities Skin: No rashes, no pallor Psychiatry: Affect normal // no auditory hallucinations    Data Reviewed: I have personally reviewed following labs and imaging studies  CBC:  Recent Labs Lab 10/24/16 2143 10/25/16 0350 10/26/16 0252  WBC 3.9* 12.4* 14.3*  NEUTROABS 3.8  --   --   HGB 11.9* 8.9* 8.6*  HCT 35.2* 25.1* 25.1*  MCV 91.9 90.9 90.9  PLT 110* 90* 87*   Basic Metabolic Panel:  Recent Labs Lab 10/24/16 2143 10/25/16 0350 10/26/16 0252  NA 142 139 144  K 3.4* 3.0* 4.2  CL 107 110 117*  CO2 24 20* 22  GLUCOSE 142* 134* 139*  BUN 15 17 17   CREATININE 1.38* 1.37* 1.03  CALCIUM 8.8* 7.7* 7.9*  MG  --  1.1*  --    GFR: Estimated Creatinine Clearance: 59.1 mL/min (by C-G formula based  on SCr of 1.03 mg/dL). Liver Function Tests: No results for input(s): AST, ALT, ALKPHOS, BILITOT, PROT, ALBUMIN in the last 168 hours. No results for input(s): LIPASE, AMYLASE in the last 168 hours. No results for input(s): AMMONIA in the last 168 hours. Coagulation Profile:  Recent Labs Lab 10/25/16 0117  INR 1.28   Cardiac Enzymes: No results for input(s): CKTOTAL, CKMB, CKMBINDEX, TROPONINI in the last 168 hours. BNP (last 3 results) No results for input(s): PROBNP in the  last 8760 hours. HbA1C: No results for input(s): HGBA1C in the last 72 hours. CBG: No results for input(s): GLUCAP in the last 168 hours. Lipid Profile: No results for input(s): CHOL, HDL, LDLCALC, TRIG, CHOLHDL, LDLDIRECT in the last 72 hours. Thyroid Function Tests: No results for input(s): TSH, T4TOTAL, FREET4, T3FREE, THYROIDAB in the last 72 hours. Anemia Panel: No results for input(s): VITAMINB12, FOLATE, FERRITIN, TIBC, IRON, RETICCTPCT in the last 72 hours. Sepsis Labs:  Recent Labs Lab 10/25/16 0117  10/25/16 1429 10/25/16 1839 10/26/16 0918 10/26/16 1201  PROCALCITON 51.32  --   --   --   --   --   LATICACIDVEN 5.4*  < > 3.0* 2.7* 2.0* 2.1*  < > = values in this interval not displayed.  Recent Results (from the past 240 hour(s))  Urine culture     Status: Abnormal   Collection Time: 10/17/16  7:00 PM  Result Value Ref Range Status   Specimen Description URINE, RANDOM  Final   Special Requests NONE  Final   Culture >=100,000 COLONIES/mL ENTEROBACTER SPECIES (A)  Final   Report Status 10/20/2016 FINAL  Final   Organism ID, Bacteria ENTEROBACTER SPECIES (A)  Final      Susceptibility   Enterobacter species - MIC*    CEFAZOLIN >=64 RESISTANT Resistant     CEFTRIAXONE 8 SENSITIVE Sensitive     CIPROFLOXACIN <=0.25 SENSITIVE Sensitive     GENTAMICIN <=1 SENSITIVE Sensitive     IMIPENEM <=0.25 SENSITIVE Sensitive     NITROFURANTOIN 128 RESISTANT Resistant     TRIMETH/SULFA <=20 SENSITIVE  Sensitive     PIP/TAZO 8 SENSITIVE Sensitive     * >=100,000 COLONIES/mL ENTEROBACTER SPECIES  Culture, blood (routine x 2)     Status: Abnormal (Preliminary result)   Collection Time: 10/24/16  9:59 PM  Result Value Ref Range Status   Specimen Description BLOOD LEFT ANTECUBITAL  Final   Special Requests   Final    BOTTLES DRAWN AEROBIC AND ANAEROBIC Blood Culture adequate volume   Culture  Setup Time   Final    GRAM NEGATIVE RODS IN BOTH AEROBIC AND ANAEROBIC BOTTLES GRAM POSITIVE COCCI IN PAIRS IN CHAINS ANAEROBIC BOTTLE ONLY CRITICAL RESULT CALLED TO, READ BACK BY AND VERIFIED WITH: PHARMD L POINDEXTER [id number] 2001 MLM    Culture (A)  Final    KLEBSIELLA PNEUMONIAE GRAM POSITIVE COCCI CULTURE REINCUBATED FOR BETTER GROWTH Performed at [facility], [place]. [address] [place], [place] [address]    Report Status PENDING  Incomplete  Blood Culture ID Panel (Reflexed)     Status: Abnormal   Collection Time: 10/24/16  9:59 PM  Result Value Ref Range Status   Enterococcus species NOT DETECTED NOT DETECTED Final   Vancomycin resistance NOT DETECTED NOT DETECTED Final   Listeria monocytogenes NOT DETECTED NOT DETECTED Final   Staphylococcus species NOT DETECTED NOT DETECTED Final   Staphylococcus aureus NOT DETECTED NOT DETECTED Final   Streptococcus species NOT DETECTED NOT DETECTED Final   Streptococcus agalactiae NOT DETECTED NOT DETECTED Final   Streptococcus pneumoniae NOT DETECTED NOT DETECTED Final   Streptococcus pyogenes NOT DETECTED NOT DETECTED Final   Acinetobacter baumannii NOT DETECTED NOT DETECTED Final   Enterobacteriaceae species DETECTED (A) NOT DETECTED Final    Comment: Enterobacteriaceae represent a large family of gram-negative bacteria, not a single organism. CRITICAL RESULT CALLED TO, READ BACK BY AND VERIFIED WITH: PHARMD L POINDEXTER [id number] 2001 MLM    Enterobacter cloacae complex DETECTED (A) NOT  DETECTED Final    Comment: CRITICAL RESULT CALLED TO,  READ BACK BY AND VERIFIED WITH: PHARMD L POINDEXTER [id number] 2001 MLM    Escherichia coli NOT DETECTED NOT DETECTED Final   Klebsiella oxytoca NOT DETECTED NOT DETECTED Final   Klebsiella pneumoniae NOT DETECTED NOT DETECTED Final   Proteus species NOT DETECTED NOT DETECTED Final   Serratia marcescens NOT DETECTED NOT DETECTED Final   Carbapenem resistance NOT DETECTED NOT DETECTED Final   Haemophilus influenzae NOT DETECTED NOT DETECTED Final   Neisseria meningitidis NOT DETECTED NOT DETECTED Final   Pseudomonas aeruginosa NOT DETECTED NOT DETECTED Final   Candida albicans NOT DETECTED NOT DETECTED Final   Candida glabrata NOT DETECTED NOT DETECTED Final   Candida krusei NOT DETECTED NOT DETECTED Final   Candida parapsilosis NOT DETECTED NOT DETECTED Final   Candida tropicalis NOT DETECTED NOT DETECTED Final    Comment: Performed at [facility], [place] [address] [place], [place] [address]  Culture, blood (routine x 2)     Status: Abnormal (Preliminary result)   Collection Time: 10/24/16 10:06 PM  Result Value Ref Range Status   Specimen Description BLOOD RIGHT ANTECUBITAL  Final   Special Requests   Final    BOTTLES DRAWN AEROBIC AND ANAEROBIC Blood Culture adequate volume   Culture  Setup Time   Final    GRAM NEGATIVE RODS IN BOTH AEROBIC AND ANAEROBIC BOTTLES CRITICAL VALUE NOTED.  VALUE IS CONSISTENT WITH PREVIOUSLY REPORTED AND CALLED VALUE. Performed at [facility], [place] [address] [place], [place] [address]    Culture ENTEROBACTER SPECIES (A)  Final   Report Status PENDING  Incomplete  MRSA PCR Screening     Status: None   Collection Time: 10/25/16 12:47 AM  Result Value Ref Range Status   MRSA by PCR NEGATIVE NEGATIVE Final    Comment:        The GeneXpert MRSA Assay (FDA approved for NASAL specimens only), is one component of a comprehensive MRSA colonization surveillance program. It is not intended to diagnose MRSA infection nor to guide or monitor  treatment for MRSA infections.      Radiology Studies: Dg Chest Port 1 View  Result Date: 10/24/2016 CLINICAL DATA:  Fever.  Hematuria. EXAM: PORTABLE CHEST 1 VIEW COMPARISON:  09/16/2016 FINDINGS: Shallow inspiration. No airspace consolidation. Mild unchanged interstitial coarsening. IMPRESSION: No consolidation or large effusion. Electronically Signed   By: Andreas Newport M.D.   On: 10/24/2016 22:41    Scheduled Meds: . colchicine  0.6 mg Oral Daily  . donepezil  10 mg Oral QHS  . enoxaparin (LOVENOX) injection  40 mg Subcutaneous QHS  . hydrocortisone sod succinate (SOLU-CORTEF) inj  100 mg Intravenous Q8H  . memantine  28 mg Oral Daily  . QUEtiapine  12.5 mg Oral QHS  . tamsulosin  0.4 mg Oral QPC supper   Continuous Infusions: . sodium chloride 125 mL/hr at 10/26/16 0600  . ceFEPime (MAXIPIME) IV       LOS: 2 days   CHIU, Orpah Melter, MD [facility] Pager [phone]  If 7PM-7AM, please contact night-coverage [email or web address] Password [email or web address] 10/26/2016, 3:06 PM

## 2016-10-27 ENCOUNTER — Telehealth: Payer: Self-pay

## 2016-10-27 DIAGNOSIS — R339 Retention of urine, unspecified: Secondary | ICD-10-CM

## 2016-10-27 DIAGNOSIS — D696 Thrombocytopenia, unspecified: Secondary | ICD-10-CM

## 2016-10-27 LAB — BASIC METABOLIC PANEL
ANION GAP: 8 (ref 5–15)
BUN: 14 mg/dL (ref 6–20)
CALCIUM: 8 mg/dL — AB (ref 8.9–10.3)
CO2: 18 mmol/L — ABNORMAL LOW (ref 22–32)
Chloride: 119 mmol/L — ABNORMAL HIGH (ref 101–111)
Creatinine, Ser: 0.88 mg/dL (ref 0.61–1.24)
GFR calc Af Amer: >60 mL/min (ref >60)
GLUCOSE: 171 mg/dL — AB (ref 65–99)
Potassium: 3.3 mmol/L — ABNORMAL LOW (ref 3.5–5.1)
SODIUM: 145 mmol/L (ref 135–145)

## 2016-10-27 LAB — CBC
HCT: 24.9 % — ABNORMAL LOW (ref 39.0–52.0)
HEMOGLOBIN: 8.7 g/dL — AB (ref 13.0–17.0)
MCH: 32.2 pg (ref 26.0–34.0)
MCHC: 34.9 g/dL (ref 30.0–36.0)
MCV: 92.2 fL (ref 78.0–100.0)
Platelets: 79 10*3/uL — ABNORMAL LOW (ref 150–400)
RBC: 2.7 MIL/uL — ABNORMAL LOW (ref 4.22–5.81)
RDW: 14.9 % (ref 11.5–15.5)
WBC: 12.9 10*3/uL — AB (ref 4.0–10.5)

## 2016-10-27 LAB — URINE CULTURE: CULTURE: NO GROWTH

## 2016-10-27 MED ORDER — CEFTRIAXONE SODIUM 2 G IJ SOLR
2.0000 g | INTRAMUSCULAR | Status: DC
  Filled 2016-10-27: qty 2

## 2016-10-27 MED ORDER — HYDROCORTISONE NA SUCCINATE PF 100 MG IJ SOLR
100.0000 mg | Freq: Two times a day (BID) | INTRAMUSCULAR | Status: DC
  Administered 2016-10-27 – 2016-11-02 (×12): 100 mg via INTRAVENOUS
  Filled 2016-10-27 (×12): qty 2

## 2016-10-27 MED ORDER — PIPERACILLIN-TAZOBACTAM 3.375 G IVPB
3.3750 g | Freq: Three times a day (TID) | INTRAVENOUS | Status: DC
  Administered 2016-10-27 – 2016-10-29 (×6): 3.375 g via INTRAVENOUS
  Filled 2016-10-27 (×6): qty 50

## 2016-10-27 MED ORDER — POTASSIUM CHLORIDE CRYS ER 20 MEQ PO TBCR
40.0000 meq | EXTENDED_RELEASE_TABLET | Freq: Once | ORAL | Status: AC
  Administered 2016-10-27: 40 meq via ORAL
  Filled 2016-10-27: qty 2

## 2016-10-27 MED ORDER — POTASSIUM CHLORIDE CRYS ER 20 MEQ PO TBCR
20.0000 meq | EXTENDED_RELEASE_TABLET | Freq: Once | ORAL | Status: AC
  Administered 2016-10-27: 20 meq via ORAL
  Filled 2016-10-27: qty 1

## 2016-10-27 NOTE — Progress Notes (Signed)
Pharmacy Antibiotic Note  Ricky Boyd is a 77 y.o. male admitted on 10/24/2016 with UTI on Cefepime. BCID = Enterobacter so received consult to narrow patient to Ceftriaxone.  Upon review of microbiology results, blood culture also now showing Enterococcus faecalis, sensitivities pending, in 1 of 2 blood cultures.  Plan: Ceftriaxone 2g IV q24h. Have notified [facility] regarding Enterococcus culture. Could potentially switch therapy to Zosyn or Levaquin to cover both organisms? F/u culture sensitivities, consider ID consult.  Height: 5\' 9"  (175.3 cm) Weight: 182 lb 5.1 oz (82.7 kg) IBW/kg (Calculated) : 70.7  Temp (24hrs), Avg:97.9 F (36.6 C), Min:97.6 F (36.4 C), Max:98.6 F (37 C)   Recent Labs Lab 10/24/16 2143  10/25/16 0350 10/25/16 1229 10/25/16 1429 10/25/16 1839 10/26/16 0252 10/26/16 0918 10/26/16 1201 10/27/16 0254  WBC 3.9*  --  12.4*  --   --   --  14.3*  --   --  12.9*  CREATININE 1.38*  --  1.37*  --   --   --  1.03  --   --  0.88  LATICACIDVEN  --   < > 3.5* 3.0* 3.0* 2.7*  --  2.0* 2.1*  --   < > = values in this interval not displayed.  Estimated Creatinine Clearance: 69.2 mL/min (by C-G formula based on SCr of 0.88 mg/dL).    Allergies  Allergen Reactions  . Hydrocodone Other (See Comments)    Reaction:  Hallucinations  . Indocin [Indomethacin] Other (See Comments)    Reaction:  Confusion   . Amoxicillin-Pot Clavulanate Other (See Comments)    Reaction:  Nightmares Has patient had a PCN reaction causing immediate rash, facial/tongue/throat swelling, SOB or lightheadedness with hypotension: No Has patient had a PCN reaction causing severe rash involving mucus membranes or skin necrosis: No Has patient had a PCN reaction that required hospitalization: No Has patient had a PCN reaction occurring within the last 10 years: Yes If all of the above answers are "NO", then may proceed with Cephalosporin use.  . Erythromycin Ethylsuccinate Other (See Comments)   Reaction:  Eye irritation     Antimicrobials this admission: 9/1 Levofloxacin >> 9/2 9/1 Aztreonam  >> 9/2 9/2 cefepime >> 9/4 94 ceftriaxone >>  Dose adjustments this admission: 9/3 increase cefepime 1 gm q8h from q12h  Microbiology results: 9/1 BCx: Enterobacter cloacae complex and Enterococcus faecalis 9/1 UCx: NGF 9/2 MRSA PCR neg 9/1 MSSA PCR neg  Thank you for allowing pharmacy to be a part of this patient's care.  Hershal Coria 10/27/2016 3:00 PM

## 2016-10-27 NOTE — Telephone Encounter (Signed)
Ricky Boyd left V/M (DPR signed) Dr Deborra Medina had recommended pt to be see at [facility] with oncology/urologist; pt has appt with oncologist/urologist this [facility] 10/29/16. Pt is presently in hospital. Ricky Boyd request cb with recommendation.

## 2016-10-27 NOTE — Progress Notes (Signed)
Pharmacy Antibiotic Note  Ricky Boyd is a 76 y.o. male admitted on 10/24/2016 with UTI on Cefepime. BCID = Enterobacter so received consult to narrow patient to Ceftriaxone.  Upon review of microbiology results, blood culture also now showing Enterococcus faecalis, sensitivities pending, in 1 of 2 blood cultures. Antibiotics adjusted to Zosyn per MD. Discussed allergy to Augmentin (reaction = nightmares) with [facility]-ok to proceed with Zosyn with careful monitoring of patient.   Plan: Zosyn 3.375g IV q8h (infuse over 4 hours) Monitor renal function, cultures/sensitivities, clinical course.   Height: 5\' 9"  (175.3 cm) Weight: 182 lb 5.1 oz (82.7 kg) IBW/kg (Calculated) : 70.7  Temp (24hrs), Avg:97.8 F (36.6 C), Min:97.6 F (36.4 C), Max:98.4 F (36.9 C)   Recent Labs Lab 10/24/16 2143  10/25/16 0350 10/25/16 1229 10/25/16 1429 10/25/16 1839 10/26/16 0252 10/26/16 0918 10/26/16 1201 10/27/16 0254  WBC 3.9*  --  12.4*  --   --   --  14.3*  --   --  12.9*  CREATININE 1.38*  --  1.37*  --   --   --  1.03  --   --  0.88  LATICACIDVEN  --   < > 3.5* 3.0* 3.0* 2.7*  --  2.0* 2.1*  --   < > = values in this interval not displayed.  Estimated Creatinine Clearance: 69.2 mL/min (by C-G formula based on SCr of 0.88 mg/dL).    Allergies  Allergen Reactions  . Hydrocodone Other (See Comments)    Reaction:  Hallucinations  . Indocin [Indomethacin] Other (See Comments)    Reaction:  Confusion   . Amoxicillin-Pot Clavulanate Other (See Comments)    Reaction:  Nightmares Has patient had a PCN reaction causing immediate rash, facial/tongue/throat swelling, SOB or lightheadedness with hypotension: No Has patient had a PCN reaction causing severe rash involving mucus membranes or skin necrosis: No Has patient had a PCN reaction that required hospitalization: No Has patient had a PCN reaction occurring within the last 10 years: Yes If all of the above answers are "NO", then may proceed with  Cephalosporin use.  . Erythromycin Ethylsuccinate Other (See Comments)    Reaction:  Eye irritation     Antimicrobials this admission: 9/1 Levofloxacin >> 9/2 9/1 Aztreonam  >> 9/2 9/2 Cefepime >> 9/4 9/4 Ceftriaxone >> 9/4 9/4 Zosyn >>  Dose adjustments this admission: 9/3 increased cefepime from 1g q12h to q8h for improved renal function  Microbiology results: 9/1 BCx: Enterobacter cloacae and Enterococcus faecalis 9/1 UCx: NGF 9/2 MRSA PCR: negative   Thank you for allowing pharmacy to be a part of this patient's care.   Lindell Spar, PharmD, BCPS Pager: [phone] 10/27/2016 4:54 PM

## 2016-10-27 NOTE — Telephone Encounter (Signed)
I am sorry to hear he is in the hospital again.  I would call the urologist he has an appointment with this week to explain the situation to them and ask what they advice.

## 2016-10-27 NOTE — Telephone Encounter (Signed)
Ricky Boyd per DPR notified

## 2016-10-27 NOTE — Progress Notes (Signed)
Patient noted to have Enterococcus in addition to Enterobacter from blood cultures  I agree with changing to zosyn to cover the enterococcus.  I would get set of blood cultures tomorrow and Dr. Megan Salon will formally see the patient as ID "auto consult" tomorrow.

## 2016-10-27 NOTE — Progress Notes (Signed)
PROGRESS NOTE    Ricky Boyd  ZOX:[id number] DOB: 1940-10-08 DOA: 10/24/2016 PCP: Lucille Passy, MD    Brief Narrative:  76 y.o. male with medical history significant of prostate cancer (see below), mild dementia, and gout presenting with gross hematuria.  About 4:30, his caregiver arrived and he was not wearing his catheter bag.  He had been wearing it at 4:15 when his son left.  There was blood around the commode where he had removed it.  She put the catheter bag back on and it filled with blood.  He seemed to fell well prior to that today.  Seemed to be finally getting over the UTI from his prior admission (7/25-31) and the last few days were his best days yet.  +dysuria but he has had ongoing discomfort.  Stopped radiation therapy due to urethral edema.  He was fine all day today.  No blood in his urine until this evening.  No fevers at home.  More confused than usual while in the ER only.    Prostate cancer diagnosed 4 years ago.  No treatment at that time.  About 6 months ago, there was another biopsy with worsening prostate cancer.  He was given Lupron and then started radioation therapy.  After week 7 he couldn't urinate at all, diarrhea was unmanagable. They stopped therapy at that time.  3 days later he had 1.5L retention and he has had catheter since.  3 days later he was hospitalized for a UTI.  They are not planning further prostate cancer treatment.  Assessment & Plan:   Principal Problem:   Sepsis secondary to UTI [facility]) Active Problems:   Malignant neoplasm of prostate ([place])   Dementia   Urinary retention   Hematuria   Hyperglycemia   Thrombocytopenia (HCC)  Severe sepsis with septic shock secondary to UTI, with hematuria -Pt febrile, hypotensive, leukocytosis, with elevated lactate -Suspect urinary source despite NO UA today - he does have a recent urine culture + for Enterobacter -Lactate gradually improving with IVF -Hematuria appears much improved -BP improved with  stress dosed steroids and IVF hydration -Blood and urine cx pos for enterobacter. Currently on cefepime. Will transition to rocephin per sensitivities. Will likely need at least 2 weeks of abx -Gm pos in pairs and chains in 1/2 blood cultures, pending speciation - possible contamination. Will follow -Begin steroid wean from 100mg  q8hr hydrocortisone to q12hr  Dementia -Possible mild metabolic encephalopathy in addition, which is thought to be related to his infection/sepsis. -Patient is continued Aricept/Namenda and Seroquel. -Currently stable  Urinary retention -As above, patient has Foley - but would prefer a suprapubic cath -Stable with indwelling foley cath -Pt is followed by Urology at [facility] -Discussed case with on-call Urologist who recommends continuing indwelling foley cath and to follow up with patient's primary Urologist following discharge.  Prostate cancer  -No longer undergoing therapy or interested in pursuing in the future -Appears stable at this time  Hyperglycemia -Suspect stress response -Glucose stable currently  Thrombocytopenia -suspect worsening related to bone marrow suppression secondary to severe sepsis -Slow trend downwards -No signs of active bleed -Will d/c lovenox, continue on SCD's  DVT prophylaxis: SCD's Code Status: DNR Family Communication: Pt in room, friend at bedside Disposition Plan: Uncertain at this time  Consultants:   Discussed case with on-call Urologist  Procedures:     Antimicrobials: Anti-infectives    Start     Dose/Rate Route Frequency Ordered Stop   10/26/16 1800  ceFEPIme (MAXIPIME) 1 g  in dextrose 5 % 50 mL IVPB     1 g 100 mL/hr over 30 Minutes Intravenous Every 8 hours 10/26/16 1014     10/25/16 2200  levofloxacin (LEVAQUIN) IVPB 500 mg  Status:  Discontinued     500 mg 100 mL/hr over 60 Minutes Intravenous Every 24 hours 10/25/16 0059 10/25/16 1022   10/25/16 1200  ceFEPIme (MAXIPIME) 1 g in dextrose 5 % 50 mL  IVPB  Status:  Discontinued     1 g 100 mL/hr over 30 Minutes Intravenous Every 12 hours 10/25/16 1022 10/26/16 1014   10/25/16 0600  aztreonam (AZACTAM) 1 g in dextrose 5 % 50 mL IVPB  Status:  Discontinued     1 g 100 mL/hr over 30 Minutes Intravenous Every 8 hours 10/25/16 0059 10/25/16 1022   10/24/16 2230  aztreonam (AZACTAM) 1 g in dextrose 5 % 50 mL IVPB     1 g 100 mL/hr over 30 Minutes Intravenous  Once 10/24/16 2218 10/25/16 0040   10/24/16 2215  levofloxacin (LEVAQUIN) IVPB 750 mg     750 mg 100 mL/hr over 90 Minutes Intravenous  Once 10/24/16 2206 10/25/16 0000      Subjective: No complaints. Feels well  Objective: Vitals:   10/27/16 0343 10/27/16 0400 10/27/16 0500 10/27/16 0600  BP:  (!) 113/58 117/76 (!) 103/55  Pulse:  (!) 50 (!) 50 (!) 49  Resp:  16 16 16   Temp:      TempSrc:      SpO2:  96% 95% 96%  Weight: 82.7 kg (182 lb 5.1 oz)     Height:        Intake/Output Summary (Last 24 hours) at 10/27/16 0901 Last data filed at 10/27/16 0300  Gross per 24 hour  Intake             4520 ml  Output             2300 ml  Net             2220 ml   Filed Weights   10/24/16 2144 10/27/16 0343  Weight: 81.6 kg (180 lb) 82.7 kg (182 lb 5.1 oz)    Examination: General exam: Awake, laying in bed, in nad Respiratory system: Normal respiratory effort, no wheezing Cardiovascular system: regular rate, s1, s2 Gastrointestinal system: Soft, nondistended, positive BS Central nervous system: CN2-12 grossly intact, strength intact Extremities: Perfused, no clubbing Skin: Normal skin turgor, no notable skin lesions seen Psychiatry: Mood normal // no visual hallucinations   Data Reviewed: I have personally reviewed following labs and imaging studies  CBC:  Recent Labs Lab 10/24/16 2143 10/25/16 0350 10/26/16 0252 10/27/16 0254  WBC 3.9* 12.4* 14.3* 12.9*  NEUTROABS 3.8  --   --   --   HGB 11.9* 8.9* 8.6* 8.7*  HCT 35.2* 25.1* 25.1* 24.9*  MCV 91.9 90.9 90.9  92.2  PLT 110* 90* 87* 79*   Basic Metabolic Panel:  Recent Labs Lab 10/24/16 2143 10/25/16 0350 10/26/16 0252 10/27/16 0254  NA 142 139 144 145  K 3.4* 3.0* 4.2 3.3*  CL 107 110 117* 119*  CO2 24 20* 22 18*  GLUCOSE 142* 134* 139* 171*  BUN 15 17 17 14   CREATININE 1.38* 1.37* 1.03 0.88  CALCIUM 8.8* 7.7* 7.9* 8.0*  MG  --  1.1*  --   --    GFR: Estimated Creatinine Clearance: 69.2 mL/min (by C-G formula based on SCr of 0.88 mg/dL). Liver Function Tests: No  results for input(s): AST, ALT, ALKPHOS, BILITOT, PROT, ALBUMIN in the last 168 hours. No results for input(s): LIPASE, AMYLASE in the last 168 hours. No results for input(s): AMMONIA in the last 168 hours. Coagulation Profile:  Recent Labs Lab 10/25/16 0117  INR 1.28   Cardiac Enzymes: No results for input(s): CKTOTAL, CKMB, CKMBINDEX, TROPONINI in the last 168 hours. BNP (last 3 results) No results for input(s): PROBNP in the last 8760 hours. HbA1C: No results for input(s): HGBA1C in the last 72 hours. CBG: No results for input(s): GLUCAP in the last 168 hours. Lipid Profile: No results for input(s): CHOL, HDL, LDLCALC, TRIG, CHOLHDL, LDLDIRECT in the last 72 hours. Thyroid Function Tests: No results for input(s): TSH, T4TOTAL, FREET4, T3FREE, THYROIDAB in the last 72 hours. Anemia Panel: No results for input(s): VITAMINB12, FOLATE, FERRITIN, TIBC, IRON, RETICCTPCT in the last 72 hours. Sepsis Labs:  Recent Labs Lab 10/25/16 0117  10/25/16 1429 10/25/16 1839 10/26/16 0918 10/26/16 1201  PROCALCITON 51.32  --   --   --   --   --   LATICACIDVEN 5.4*  < > 3.0* 2.7* 2.0* 2.1*  < > = values in this interval not displayed.  Recent Results (from the past 240 hour(s))  Urine culture     Status: Abnormal   Collection Time: 10/17/16  7:00 PM  Result Value Ref Range Status   Specimen Description URINE, RANDOM  Final   Special Requests NONE  Final   Culture >=100,000 COLONIES/mL ENTEROBACTER SPECIES (A)   Final   Report Status 10/20/2016 FINAL  Final   Organism ID, Bacteria ENTEROBACTER SPECIES (A)  Final      Susceptibility   Enterobacter species - MIC*    CEFAZOLIN >=64 RESISTANT Resistant     CEFTRIAXONE 8 SENSITIVE Sensitive     CIPROFLOXACIN <=0.25 SENSITIVE Sensitive     GENTAMICIN <=1 SENSITIVE Sensitive     IMIPENEM <=0.25 SENSITIVE Sensitive     NITROFURANTOIN 128 RESISTANT Resistant     TRIMETH/SULFA <=20 SENSITIVE Sensitive     PIP/TAZO 8 SENSITIVE Sensitive     * >=100,000 COLONIES/mL ENTEROBACTER SPECIES  Culture, blood (routine x 2)     Status: Abnormal (Preliminary result)   Collection Time: 10/24/16  9:59 PM  Result Value Ref Range Status   Specimen Description BLOOD LEFT ANTECUBITAL  Final   Special Requests   Final    BOTTLES DRAWN AEROBIC AND ANAEROBIC Blood Culture adequate volume   Culture  Setup Time   Final    GRAM NEGATIVE RODS IN BOTH AEROBIC AND ANAEROBIC BOTTLES GRAM POSITIVE COCCI IN PAIRS IN CHAINS ANAEROBIC BOTTLE ONLY CRITICAL RESULT CALLED TO, READ BACK BY AND VERIFIED WITH: PHARMD L POINDEXTER [id number] 2001 MLM    Culture (A)  Final    ENTEROBACTER CLOACAE GRAM POSITIVE COCCI CULTURE REINCUBATED FOR BETTER GROWTH Performed at [facility], [place] [address] [place], [place] [address]    Report Status PENDING  Incomplete  Blood Culture ID Panel (Reflexed)     Status: Abnormal   Collection Time: 10/24/16  9:59 PM  Result Value Ref Range Status   Enterococcus species NOT DETECTED NOT DETECTED Final   Vancomycin resistance NOT DETECTED NOT DETECTED Final   Listeria monocytogenes NOT DETECTED NOT DETECTED Final   Staphylococcus species NOT DETECTED NOT DETECTED Final   Staphylococcus aureus NOT DETECTED NOT DETECTED Final   Streptococcus species NOT DETECTED NOT DETECTED Final   Streptococcus agalactiae NOT DETECTED NOT DETECTED Final   Streptococcus  pneumoniae NOT DETECTED NOT DETECTED Final   Streptococcus pyogenes NOT DETECTED NOT DETECTED  Final   Acinetobacter baumannii NOT DETECTED NOT DETECTED Final   Enterobacteriaceae species DETECTED (A) NOT DETECTED Final    Comment: Enterobacteriaceae represent a large family of gram-negative bacteria, not a single organism. CRITICAL RESULT CALLED TO, READ BACK BY AND VERIFIED WITH: PHARMD L POINDEXTER [phone] MLM    Enterobacter cloacae complex DETECTED (A) NOT DETECTED Final    Comment: CRITICAL RESULT CALLED TO, READ BACK BY AND VERIFIED WITH: PHARMD L POINDEXTER [id number] 2001 MLM    Escherichia coli NOT DETECTED NOT DETECTED Final   Klebsiella oxytoca NOT DETECTED NOT DETECTED Final   Klebsiella pneumoniae NOT DETECTED NOT DETECTED Final   Proteus species NOT DETECTED NOT DETECTED Final   Serratia marcescens NOT DETECTED NOT DETECTED Final   Carbapenem resistance NOT DETECTED NOT DETECTED Final   Haemophilus influenzae NOT DETECTED NOT DETECTED Final   Neisseria meningitidis NOT DETECTED NOT DETECTED Final   Pseudomonas aeruginosa NOT DETECTED NOT DETECTED Final   Candida albicans NOT DETECTED NOT DETECTED Final   Candida glabrata NOT DETECTED NOT DETECTED Final   Candida krusei NOT DETECTED NOT DETECTED Final   Candida parapsilosis NOT DETECTED NOT DETECTED Final   Candida tropicalis NOT DETECTED NOT DETECTED Final    Comment: Performed at [facility], [place] [address] [place], [place] [address]  Culture, blood (routine x 2)     Status: Abnormal (Preliminary result)   Collection Time: 10/24/16 10:06 PM  Result Value Ref Range Status   Specimen Description BLOOD RIGHT ANTECUBITAL  Final   Special Requests   Final    BOTTLES DRAWN AEROBIC AND ANAEROBIC Blood Culture adequate volume   Culture  Setup Time   Final    GRAM NEGATIVE RODS IN BOTH AEROBIC AND ANAEROBIC BOTTLES CRITICAL VALUE NOTED.  VALUE IS CONSISTENT WITH PREVIOUSLY REPORTED AND CALLED VALUE.    Culture (A)  Final    ENTEROBACTER SPECIES CULTURE REINCUBATED FOR BETTER GROWTH Performed at [facility], [place] [address] [place], [place] [address]    Report Status PENDING  Incomplete  MRSA PCR Screening     Status: None   Collection Time: 10/25/16 12:47 AM  Result Value Ref Range Status   MRSA by PCR NEGATIVE NEGATIVE Final    Comment:        The GeneXpert MRSA Assay (FDA approved for NASAL specimens only), is one component of a comprehensive MRSA colonization surveillance program. It is not intended to diagnose MRSA infection nor to guide or monitor treatment for MRSA infections.      Radiology Studies: No results found.  Scheduled Meds: . colchicine  0.6 mg Oral Daily  . donepezil  10 mg Oral QHS  . hydrocortisone sod succinate (SOLU-CORTEF) inj  100 mg Intravenous Q8H  . memantine  28 mg Oral Daily  . QUEtiapine  12.5 mg Oral QHS  . tamsulosin  0.4 mg Oral QPC supper   Continuous Infusions: . sodium chloride 125 mL/hr at 10/27/16 0754  . ceFEPime (MAXIPIME) IV Stopped (10/27/16 0320)     LOS: 3 days   Braxton Weisbecker, Orpah Melter, MD [facility] Pager [phone]  If 7PM-7AM, please contact night-coverage [email or web address] Password [email or web address] 10/27/2016, 9:01 AM

## 2016-10-28 DIAGNOSIS — A4181 Sepsis due to Enterococcus: Secondary | ICD-10-CM

## 2016-10-28 DIAGNOSIS — D649 Anemia, unspecified: Secondary | ICD-10-CM | POA: Diagnosis present

## 2016-10-28 DIAGNOSIS — B952 Enterococcus as the cause of diseases classified elsewhere: Secondary | ICD-10-CM | POA: Diagnosis present

## 2016-10-28 DIAGNOSIS — B9689 Other specified bacterial agents as the cause of diseases classified elsewhere: Secondary | ICD-10-CM | POA: Diagnosis present

## 2016-10-28 DIAGNOSIS — R7881 Bacteremia: Secondary | ICD-10-CM

## 2016-10-28 DIAGNOSIS — Z87891 Personal history of nicotine dependence: Secondary | ICD-10-CM

## 2016-10-28 DIAGNOSIS — Z880 Allergy status to penicillin: Secondary | ICD-10-CM

## 2016-10-28 DIAGNOSIS — F0390 Unspecified dementia without behavioral disturbance: Secondary | ICD-10-CM

## 2016-10-28 LAB — BASIC METABOLIC PANEL
Anion gap: 3 — ABNORMAL LOW (ref 5–15)
BUN: 14 mg/dL (ref 6–20)
CALCIUM: 8.2 mg/dL — AB (ref 8.9–10.3)
CO2: 23 mmol/L (ref 22–32)
CREATININE: 1.03 mg/dL (ref 0.61–1.24)
Chloride: 119 mmol/L — ABNORMAL HIGH (ref 101–111)
Glucose, Bld: 115 mg/dL — ABNORMAL HIGH (ref 65–99)
Potassium: 3.7 mmol/L (ref 3.5–5.1)
SODIUM: 145 mmol/L (ref 135–145)

## 2016-10-28 LAB — CBC
HCT: 26.2 % — ABNORMAL LOW (ref 39.0–52.0)
Hemoglobin: 8.8 g/dL — ABNORMAL LOW (ref 13.0–17.0)
MCH: 30.7 pg (ref 26.0–34.0)
MCHC: 33.6 g/dL (ref 30.0–36.0)
MCV: 91.3 fL (ref 78.0–100.0)
PLATELETS: 100 10*3/uL — AB (ref 150–400)
RBC: 2.87 MIL/uL — AB (ref 4.22–5.81)
RDW: 15.1 % (ref 11.5–15.5)
WBC: 10.4 10*3/uL (ref 4.0–10.5)

## 2016-10-28 LAB — CULTURE, BLOOD (ROUTINE X 2)
SPECIAL REQUESTS: ADEQUATE
Special Requests: ADEQUATE

## 2016-10-28 NOTE — Progress Notes (Signed)
PROGRESS NOTE    Ricky Boyd  YCX:[id number] DOB: 23-Jan-1941 DOA: 10/24/2016 PCP: Lucille Passy, MD   Brief Narrative: 76 year old male with a history of prostate cancer recurrent urinary tract infection hematuria admitted with hematuria again. Appreciate in infectious disease consultation for enterococcal bacteremia. Patient is currently on continuous bladder irrigation. Patient's daughter is adamant that she wants to take him to an appointment at [facility] to see a oncology urology. I have told her that he cannot be taken out of the hospital like that for appointments. And if she does take a medical be at discharge and then a readmit if she decides to bring him back. Patient is currently working with physical therapy.   Assessment & Plan:   Principal Problem:   Sepsis secondary to UTI [facility]) Active Problems:   GOUT   Malignant neoplasm of prostate ([place])   Acute encephalopathy   Dementia   Urinary retention   Hematuria   Hyperglycemia   Thrombocytopenia (HCC)   Normocytic anemia   Enterococcal bacteremia   Bacteremia due to Enterobacter species   Enterococcal bacteremia. 2 sets of blood cultures ordered for today. Continue Zosyn for now. Appreciate infectious disease follow-up.  Hematuria secondary to trauma from the Foley catheter. Continue with bladder irrigation. Hematuria is resolving.  Gout stable on colchicine.  Dementia with behaviors continue Aricept and Namenda and Seroquel.  Hypokalemia resolved.  Chronic thrombocytopenia stable.   DVT prophylaxis:scd Code Status: dnr Family Communication: dw daughter. Disposition Plan:    ConsultantsID  Procedures: CBI  Antimicrobials: zosyn   Subjective:   Objective: Vitals:   10/27/16 1350 10/27/16 1745 10/27/16 2158 10/28/16 0641  BP: 106/62  122/66 (!) 146/79  Pulse: (!) 55  65 (!) 47  Resp:   18 16  Temp: 97.6 F (36.4 C) 98.6 F (37 C) 97.8 F (36.6 C) 97.7 F (36.5 C)  TempSrc: Oral Oral Oral  Oral  SpO2: 99%  95% 93%  Weight:      Height:        Intake/Output Summary (Last 24 hours) at 10/28/16 1435 Last data filed at 10/28/16 1000  Gross per 24 hour  Intake             1175 ml  Output             1551 ml  Net             -376 ml   Filed Weights   10/24/16 2144 10/27/16 0343  Weight: 81.6 kg (180 lb) 82.7 kg (182 lb 5.1 oz)    Examination:  General exam: Appears calm and comfortable  Respiratory system: Clear to auscultation. Respiratory effort normal. Cardiovascular system: S1 & S2 heard, RRR. No JVD, murmurs, rubs, gallops or clicks. No pedal edema. Gastrointestinal system: Abdomen is nondistended, soft and nontender. No organomegaly or masses felt. Normal bowel sounds heard. Central nervous system: Alert and oriented. No focal neurological deficits. Extremities: Symmetric 5 x 5 power. Skin: No rashes, lesions or ulcers Psychiatry: Judgement and insight appear normal. Mood & affect appropriate.     Data Reviewed: I have personally reviewed following labs and imaging studies  CBC:  Recent Labs Lab 10/24/16 2143 10/25/16 0350 10/26/16 0252 10/27/16 0254 10/28/16 0742  WBC 3.9* 12.4* 14.3* 12.9* 10.4  NEUTROABS 3.8  --   --   --   --   HGB 11.9* 8.9* 8.6* 8.7* 8.8*  HCT 35.2* 25.1* 25.1* 24.9* 26.2*  MCV 91.9 90.9 90.9 92.2 91.3  PLT  110* 90* 87* 79* 270*   Basic Metabolic Panel:  Recent Labs Lab 10/24/16 2143 10/25/16 0350 10/26/16 0252 10/27/16 0254 10/28/16 0742  NA 142 139 144 145 145  K 3.4* 3.0* 4.2 3.3* 3.7  CL 107 110 117* 119* 119*  CO2 24 20* 22 18* 23  GLUCOSE 142* 134* 139* 171* 115*  BUN 15 17 17 14 14   CREATININE 1.38* 1.37* 1.03 0.88 1.03  CALCIUM 8.8* 7.7* 7.9* 8.0* 8.2*  MG  --  1.1*  --   --   --    GFR: Estimated Creatinine Clearance: 59.1 mL/min (by C-G formula based on SCr of 1.03 mg/dL). Liver Function Tests: No results for input(s): AST, ALT, ALKPHOS, BILITOT, PROT, ALBUMIN in the last 168 hours. No results for  input(s): LIPASE, AMYLASE in the last 168 hours. No results for input(s): AMMONIA in the last 168 hours. Coagulation Profile:  Recent Labs Lab 10/25/16 0117  INR 1.28   Cardiac Enzymes: No results for input(s): CKTOTAL, CKMB, CKMBINDEX, TROPONINI in the last 168 hours. BNP (last 3 results) No results for input(s): PROBNP in the last 8760 hours. HbA1C: No results for input(s): HGBA1C in the last 72 hours. CBG: No results for input(s): GLUCAP in the last 168 hours. Lipid Profile: No results for input(s): CHOL, HDL, LDLCALC, TRIG, CHOLHDL, LDLDIRECT in the last 72 hours. Thyroid Function Tests: No results for input(s): TSH, T4TOTAL, FREET4, T3FREE, THYROIDAB in the last 72 hours. Anemia Panel: No results for input(s): VITAMINB12, FOLATE, FERRITIN, TIBC, IRON, RETICCTPCT in the last 72 hours. Sepsis Labs:  Recent Labs Lab 10/25/16 0117  10/25/16 1429 10/25/16 1839 10/26/16 0918 10/26/16 1201  PROCALCITON 51.32  --   --   --   --   --   LATICACIDVEN 5.4*  < > 3.0* 2.7* 2.0* 2.1*  < > = values in this interval not displayed.  Recent Results (from the past 240 hour(s))  Culture, blood (routine x 2)     Status: Abnormal   Collection Time: 10/24/16  9:59 PM  Result Value Ref Range Status   Specimen Description BLOOD LEFT ANTECUBITAL  Final   Special Requests   Final    BOTTLES DRAWN AEROBIC AND ANAEROBIC Blood Culture adequate volume   Culture  Setup Time   Final    GRAM NEGATIVE RODS IN BOTH AEROBIC AND ANAEROBIC BOTTLES GRAM POSITIVE COCCI IN PAIRS IN CHAINS ANAEROBIC BOTTLE ONLY CRITICAL RESULT CALLED TO, READ BACK BY AND VERIFIED WITH: Yevette Edwards [phone] MLM Performed at [facility], [place] [address], [place], [place] [address]    Culture (A)  Final    ENTEROBACTER CLOACAE CORRECTED RESULTS PREVIOUSLY REPORTED AS: KLEBSIELLA PNEUMONIAE CORRECTED RESULTS CALLED TO: J FRENS 10/27/16 @ 1311 M VESTAL ENTEROCOCCUS FAECALIS    Report Status 10/28/2016  FINAL  Final   Organism ID, Bacteria ENTEROBACTER CLOACAE  Final   Organism ID, Bacteria ENTEROCOCCUS FAECALIS  Final      Susceptibility   Enterobacter cloacae - MIC*    CEFAZOLIN >=64 RESISTANT Resistant     CEFEPIME <=1 SENSITIVE Sensitive     CEFTAZIDIME <=1 SENSITIVE Sensitive     CEFTRIAXONE 8 SENSITIVE Sensitive     CIPROFLOXACIN <=0.25 SENSITIVE Sensitive     GENTAMICIN <=1 SENSITIVE Sensitive     IMIPENEM <=0.25 SENSITIVE Sensitive     TRIMETH/SULFA <=20 SENSITIVE Sensitive     PIP/TAZO <=4 SENSITIVE Sensitive     * ENTEROBACTER CLOACAE   Enterococcus faecalis - MIC*  AMPICILLIN <=2 SENSITIVE Sensitive     VANCOMYCIN 1 SENSITIVE Sensitive     GENTAMICIN SYNERGY SENSITIVE Sensitive     * ENTEROCOCCUS FAECALIS  Blood Culture ID Panel (Reflexed)     Status: Abnormal   Collection Time: 10/24/16  9:59 PM  Result Value Ref Range Status   Enterococcus species NOT DETECTED NOT DETECTED Final   Vancomycin resistance NOT DETECTED NOT DETECTED Final   Listeria monocytogenes NOT DETECTED NOT DETECTED Final   Staphylococcus species NOT DETECTED NOT DETECTED Final   Staphylococcus aureus NOT DETECTED NOT DETECTED Final   Streptococcus species NOT DETECTED NOT DETECTED Final   Streptococcus agalactiae NOT DETECTED NOT DETECTED Final   Streptococcus pneumoniae NOT DETECTED NOT DETECTED Final   Streptococcus pyogenes NOT DETECTED NOT DETECTED Final   Acinetobacter baumannii NOT DETECTED NOT DETECTED Final   Enterobacteriaceae species DETECTED (A) NOT DETECTED Final    Comment: Enterobacteriaceae represent a large family of gram-negative bacteria, not a single organism. CRITICAL RESULT CALLED TO, READ BACK BY AND VERIFIED WITH: PHARMD L POINDEXTER [phone] MLM    Enterobacter cloacae complex DETECTED (A) NOT DETECTED Final    Comment: CRITICAL RESULT CALLED TO, READ BACK BY AND VERIFIED WITH: PHARMD L POINDEXTER [id number] 2001 MLM    Escherichia coli NOT DETECTED NOT DETECTED Final    Klebsiella oxytoca NOT DETECTED NOT DETECTED Final   Klebsiella pneumoniae NOT DETECTED NOT DETECTED Final   Proteus species NOT DETECTED NOT DETECTED Final   Serratia marcescens NOT DETECTED NOT DETECTED Final   Carbapenem resistance NOT DETECTED NOT DETECTED Final   Haemophilus influenzae NOT DETECTED NOT DETECTED Final   Neisseria meningitidis NOT DETECTED NOT DETECTED Final   Pseudomonas aeruginosa NOT DETECTED NOT DETECTED Final   Candida albicans NOT DETECTED NOT DETECTED Final   Candida glabrata NOT DETECTED NOT DETECTED Final   Candida krusei NOT DETECTED NOT DETECTED Final   Candida parapsilosis NOT DETECTED NOT DETECTED Final   Candida tropicalis NOT DETECTED NOT DETECTED Final    Comment: Performed at [facility], [place] [address] [place], [place] [address]  Urine culture     Status: None   Collection Time: 10/24/16 10:04 PM  Result Value Ref Range Status   Specimen Description URINE, RANDOM  Final   Special Requests NONE  Final   Culture   Final    NO GROWTH Performed at [facility], [place] [address], [place], [place] [address]    Report Status 10/27/2016 FINAL  Final  Culture, blood (routine x 2)     Status: Abnormal   Collection Time: 10/24/16 10:06 PM  Result Value Ref Range Status   Specimen Description BLOOD RIGHT ANTECUBITAL  Final   Special Requests   Final    BOTTLES DRAWN AEROBIC AND ANAEROBIC Blood Culture adequate volume   Culture  Setup Time   Final    GRAM NEGATIVE RODS IN BOTH AEROBIC AND ANAEROBIC BOTTLES CRITICAL VALUE NOTED.  VALUE IS CONSISTENT WITH PREVIOUSLY REPORTED AND CALLED VALUE.    Culture (A)  Final    ENTEROBACTER CLOACAE SUSCEPTIBILITIES PERFORMED ON PREVIOUS CULTURE WITHIN THE LAST 5 DAYS. Performed at [facility], [place] [address], [place],  [address]    Report Status 10/28/2016 FINAL  Final  MRSA PCR Screening     Status: None   Collection Time: 10/25/16 12:47 AM  Result Value Ref Range Status   MRSA by  PCR NEGATIVE NEGATIVE Final    Comment:  The GeneXpert MRSA Assay (FDA approved for NASAL specimens only), is one component of a comprehensive MRSA colonization surveillance program. It is not intended to diagnose MRSA infection nor to guide or monitor treatment for MRSA infections.          Radiology Studies: No results found.      Scheduled Meds: . colchicine  0.6 mg Oral Daily  . donepezil  10 mg Oral QHS  . hydrocortisone sod succinate (SOLU-CORTEF) inj  100 mg Intravenous Q12H  . memantine  28 mg Oral Daily  . QUEtiapine  12.5 mg Oral QHS  . tamsulosin  0.4 mg Oral QPC supper   Continuous Infusions: . sodium chloride 125 mL/hr at 10/28/16 0922  . piperacillin-tazobactam (ZOSYN)  IV 3.375 g (10/28/16 1006)     LOS: 4 days      Georgette Shell, MD [facility]   If 7PM-7AM, please contact night-coverage [email or web address] Password [email or web address] 10/28/2016, 2:35 PM

## 2016-10-28 NOTE — Evaluation (Signed)
Physical Therapy Evaluation Patient Details Name: Ricky Boyd MRN: [id number] DOB: 23-Jun-1940 Today's Date: 10/28/2016   History of Present Illness  76 y.o. male with medical history significant of prostate cancer, mild dementia, and gout presenting with gross hematuria.  About 4:30 on 9/1 his caregiver arrived and he was not wearing his catheter bag.  He had been wearing it at 4:15 when his son left.  There was blood around the commode where he had removed it.  She put the catheter bag back on and it filled with blood. Previous admission for UTI on 7/25-31. More confused than usual. DC'd to snf  1 month ago.  Clinical Impression  The patient presents with confusion, caregiver present. Patient requires assistance for safety. Recommend 24/7 caregivers. Pt admitted with above diagnosis. Pt currently with functional limitations due to the deficits listed below (see PT Problem List). Pt will benefit from skilled PT to increase their independence and safety with mobility to allow discharge to the venue listed below.       Follow Up Recommendations SNF;Supervision/Assistance - 24 hour/or family/caregivers at home w/ HHPT    Equipment Recommendations  None recommended by PT    Recommendations for Other Services       Precautions / Restrictions Precautions Precautions: Fall Precaution Comments: dementia Restrictions Weight Bearing Restrictions: No      Mobility  Bed Mobility Overal bed mobility: Needs Assistance Bed Mobility: Sit to Supine       Sit to supine: Mod assist   General bed mobility comments: assist with legs, multimodal cues for mobility and safety  Transfers Overall transfer level: Needs assistance Equipment used: Rolling walker (2 wheeled) Transfers: Sit to/from Stand Sit to Stand: Mod assist         General transfer comment: multimodal cues, assist to power  up to stand, steady assist.  Ambulation/Gait Ambulation/Gait assistance: Mod assist Ambulation  Distance (Feet): 40 Feet Assistive device: Rolling walker (2 wheeled) Gait Pattern/deviations: Step-to pattern;Step-through pattern;Staggering left;Staggering right Gait velocity: decr   General Gait Details: multimodal cues for safety with RW.  Stairs            Wheelchair Mobility    Modified Rankin (Stroke Patients Only)       Balance Overall balance assessment: Needs assistance Sitting-balance support: Bilateral upper extremity supported;Feet supported Sitting balance-Leahy Scale: Fair     Standing balance support: Bilateral upper extremity supported;During functional activity Standing balance-Leahy Scale: Poor                               Pertinent Vitals/Pain Pain Assessment: No/denies pain    Home Living Family/patient expects to be discharged to:: Private residence Living Arrangements: Alone Available Help at Discharge: Other (Comment);Personal care attendant Type of Home: House Home Access: Stairs to enter Entrance Stairs-Rails: Right Entrance Stairs-Number of Steps: 3 Home Layout: One level Home Equipment: Toilet riser;Shower seat;Walker - 4 wheels Additional Comments:  caregiver for several hours daily with gap in caregiver which is when patient pulled catheter. was recently in SNF for rehab..    Prior Function Level of Independence: Needs assistance   Gait / Transfers Assistance Needed: using rollator for ambulation   ADL's / Homemaking Assistance Needed: Caregiver assists with getting bathing/dressing ADLs, receives assist for iADLs  Comments: Pt currently lives alone with caregivers part of  The time.      Hand Dominance        Extremity/Trunk Assessment   Upper  Extremity Assessment Upper Extremity Assessment: Generalized weakness    Lower Extremity Assessment Lower Extremity Assessment: Defer to PT evaluation    Cervical / Trunk Assessment Cervical / Trunk Assessment: Normal  Communication   Communication: Expressive  difficulties (speech garbled at times )  Cognition Arousal/Alertness: Awake/alert   Overall Cognitive Status: History of cognitive impairments - at baseline Area of Impairment: Orientation;Attention;Memory;Following commands;Safety/judgement;Awareness                 Orientation Level: Place;Time;Situation Current Attention Level: Selective Memory: Decreased short-term memory Following Commands: Follows one step commands inconsistently              General Comments      Exercises     Assessment/Plan    PT Assessment Patient needs continued PT services  PT Problem List Decreased strength;Decreased cognition;Decreased activity tolerance;Decreased safety awareness;Decreased mobility;Decreased knowledge of precautions;Decreased balance       PT Treatment Interventions DME instruction;Gait training;Functional mobility training;Therapeutic activities;Patient/family education    PT Goals (Current goals can be found in the Care Plan section)  Acute Rehab PT Goals Patient Stated Goal: to go home PT Goal Formulation: With patient/family Time For Goal Achievement: 11/11/16 Potential to Achieve Goals: Fair    Frequency Min 2X/week   Barriers to discharge Decreased caregiver support      Co-evaluation               AM-PAC PT "6 Clicks" Daily Activity  Outcome Measure Difficulty turning over in bed (including adjusting bedclothes, sheets and blankets)?: Unable Difficulty moving from lying on back to sitting on the side of the bed? : Unable Difficulty sitting down on and standing up from a chair with arms (e.g., wheelchair, bedside commode, etc,.)?: Unable Help needed moving to and from a bed to chair (including a wheelchair)?: Total Help needed walking in hospital room?: Total Help needed climbing 3-5 steps with a railing? : Total 6 Click Score: 6    End of Session Equipment Utilized During Treatment: Gait belt Activity Tolerance: Patient tolerated treatment  well Patient left: in bed;with call bell/phone within reach;with restraints reapplied;with bed alarm set Nurse Communication: Mobility status PT Visit Diagnosis: Difficulty in walking, not elsewhere classified (R26.2)    Time: 0488-8916 PT Time Calculation (min) (ACUTE ONLY): 16 min   Charges:   PT Evaluation $PT Eval Low Complexity: 1 Low     PT G CodesTresa Endo PT [phone]   Claretha Cooper 10/28/2016, 3:58 PM

## 2016-10-28 NOTE — Evaluation (Signed)
Occupational Therapy Evaluation Patient Details Name: Ricky Boyd MRN: [id number] DOB: 09/11/1940 Today's Date: 10/28/2016    History of Present Illness 76 y.o. male with medical history significant of prostate cancer, mild dementia, and gout presenting with gross hematuria.  About 4:30 on 9/1 his caregiver arrived and he was not wearing his catheter bag.  He had been wearing it at 4:15 when his son left.  There was blood around the commode where he had removed it.  She put the catheter bag back on and it filled with blood. Previous admission for UTI on 7/25-31. More confused than usual while in the ER only.    Clinical Impression   This 76 y/o M presents with the above. Pt lives along, at baseline is mod independent with functional mobility using RW, receives daily assist for ADL completion from personal care attendant. Pt currently requires MinA for taking few steps within room and to transfer to chair using RW, requires increased time and multimodal cues to follow directions. Pt requires MaxA for LB ADLs. Pt will benefit from continued acute OT services and recommend post acute OT services in SNF setting prior to return home to maximize Pt's safety and independence with ADLs and functional mobility.     Follow Up Recommendations  SNF;Supervision/Assistance - 24 hour    Equipment Recommendations  3 in 1 bedside commode;Other (comment) (to be further assessed in next venue of care )           Precautions / Restrictions Precautions Precautions: Fall Precaution Comments: dementia Restrictions Weight Bearing Restrictions: No      Mobility Bed Mobility Overal bed mobility: Needs Assistance Bed Mobility: Supine to Sit       Sit to supine: Min assist   General bed mobility comments: assist to bring trunk into upright position   Transfers Overall transfer level: Needs assistance Equipment used: Rolling walker (2 wheeled) Transfers: Sit to/from Stand Sit to Stand: Min assist         General transfer comment: multimodal cues, assist to power  up to stand, steady assist.    Balance Overall balance assessment: Needs assistance Sitting-balance support: Bilateral upper extremity supported;Feet supported Sitting balance-Leahy Scale: Fair     Standing balance support: Bilateral upper extremity supported;During functional activity Standing balance-Leahy Scale: Poor                             ADL either performed or assessed with clinical judgement   ADL Overall ADL's : Needs assistance/impaired Eating/Feeding: Set up;Sitting   Grooming: Wash/dry face;Set up;Sitting   Upper Body Bathing: Minimal assistance;Sitting   Lower Body Bathing: Moderate assistance;Sit to/from stand   Upper Body Dressing : Minimal assistance;Sitting   Lower Body Dressing: Maximal assistance;Sit to/from stand   Toilet Transfer: Minimal assistance;Stand-pivot;BSC;RW   Toileting- Clothing Manipulation and Hygiene: Moderate assistance;Sit to/from stand       Functional mobility during ADLs: Minimal assistance;Rolling walker General ADL Comments: Pt completed short room level functional mobility to sink next to bedside and then to recliner, minA for mobility and requires multimodal cues to follow directions throughout                          Pertinent Vitals/Pain Pain Assessment: No/denies pain          Extremity/Trunk Assessment Upper Extremity Assessment Upper Extremity Assessment: Generalized weakness   Lower Extremity Assessment Lower Extremity Assessment: Defer to PT evaluation  Cervical / Trunk Assessment Cervical / Trunk Assessment: Normal   Communication Communication Communication: Expressive difficulties (speech garbled at times )   Cognition Arousal/Alertness: Awake/alert   Overall Cognitive Status: History of cognitive impairments - at baseline Area of Impairment: Orientation;Attention;Memory;Following  commands;Safety/judgement;Awareness                 Orientation Level: Place;Time;Situation Current Attention Level: Selective Memory: Decreased short-term memory Following Commands: Follows one step commands inconsistently                            Home Living Family/patient expects to be discharged to:: Private residence Living Arrangements: Alone Available Help at Discharge: Other (Comment);Personal care attendant Type of Home: House Home Access: Stairs to enter [employer] of Steps: 3 Entrance Stairs-Rails: Right Home Layout: One level     Bathroom Shower/Tub: Teacher, early years/pre: Standard     Home Equipment: Toilet riser;Shower seat;Walker - 4 wheels   Additional Comments:  caregiver for several hours daily with gap in caregiver which is when patient pulled catheter. was recently in SNF for rehab..      Prior Functioning/Environment Level of Independence: Needs assistance  Gait / Transfers Assistance Needed: using rollator for ambulation  ADL's / Homemaking Assistance Needed: Caregiver assists with getting bathing/dressing ADLs, receives assist for iADLs   Comments: Pt currently lives alone, Pt's family visits him nearly on a daily basis.         OT Problem List: Decreased strength;Impaired balance (sitting and/or standing);Decreased cognition;Decreased activity tolerance;Decreased knowledge of use of DME or AE      OT Treatment/Interventions: Self-care/ADL training;DME and/or AE instruction;Therapeutic activities;Balance training;Therapeutic exercise;Patient/family education    OT Goals(Current goals can be found in the care plan section) Acute Rehab OT Goals Patient Stated Goal: to go home OT Goal Formulation: With patient Time For Goal Achievement: 11/11/16 Potential to Achieve Goals: Good  OT Frequency: Min 2X/week                             AM-PAC PT "6 Clicks" Daily Activity     Outcome Measure  Help from another person eating meals?: A Little Help from another person taking care of personal grooming?: A Little Help from another person toileting, which includes using toliet, bedpan, or urinal?: A Lot Help from another person bathing (including washing, rinsing, drying)?: A Lot Help from another person to put on and taking off regular upper body clothing?: A Little Help from another person to put on and taking off regular lower body clothing?: A Lot 6 Click Score: 15   End of Session Equipment Utilized During Treatment: Gait belt;Rolling walker Nurse Communication: Mobility status  Activity Tolerance: Patient tolerated treatment well Patient left: in chair;with call bell/phone within reach;with chair alarm set;with family/visitor present  OT Visit Diagnosis: Unsteadiness on feet (R26.81);Muscle weakness (generalized) (M62.81)                Time: 1610-9604 OT Time Calculation (min): 31 min Charges:  OT General Charges $OT Visit: 1 Visit OT Evaluation $OT Eval Low Complexity: 1 Low OT Treatments $Self Care/Home Management : 8-22 mins G-Codes:     Lou Cal, OT Pager [phone] 10/28/2016   [name] 10/28/2016, 4:05 PM

## 2016-10-28 NOTE — Consult Note (Signed)
[facility] for Infectious Disease    Date of Admission:  10/24/2016    Total days of antibiotics 5        Day 1 piperacillin tazobactam              Reason for Consult: Automatic consultation for enterococcal (and Enterobacter) bacteremia     Assessment: I suspect that Enterobacter is the major culprit causing his sepsis. The one blood culture growing enterococcus may be an insignificant finding. I will order repeat blood cultures. I agree with continuing piperacillin tazobactam pending final antibiotic susceptibilities.   Plan: 1. Repeat blood cultures 2. Continue piperacillin tazobactam   Principal Problem:   Sepsis secondary to UTI [facility]) Active Problems:   Enterococcal bacteremia   Bacteremia due to Enterobacter species   GOUT   Malignant neoplasm of prostate (HCC)   Acute encephalopathy   Dementia   Urinary retention   Hematuria   Hyperglycemia   Thrombocytopenia (HCC)   Normocytic anemia   . colchicine  0.6 mg Oral Daily  . donepezil  10 mg Oral QHS  . hydrocortisone sod succinate (SOLU-CORTEF) inj  100 mg Intravenous Q12H  . memantine  28 mg Oral Daily  . QUEtiapine  12.5 mg Oral QHS  . tamsulosin  0.4 mg Oral QPC supper    HPI: Ricky Boyd is a 76 y.o. male with history of prostate cancer. He has had an indwelling Foley catheter. His caregiver tells me that he has had lots of problems with irritation from the catheter. He was recently treated for an Enterobacter urinary tract infection. On 10/24/2016 he was found in the bathroom having pulled his Foley catheter out. He was admitted that day with gross hematuria. The catheter was replaced. He was not febrile but he was hypotensive with an elevated lactic acid. Both sets of admission blood cultures have grown Enterobacter and one is growing enterococcus. Antibiotic susceptibilities are pending. He has no recall of being admitted to the hospital. When I ask him how he is doing all he will tell me is "I am  old".   Review of Systems: Review of Systems  Unable to perform ROS: Mental acuity    Past Medical History:  Diagnosis Date  . Arthritis   . Dementia 09/16/2016  . Gout    usually knees, occasionally feet  . Hearing aid worn   . Prostate cancer [facility])     Social History  Substance Use Topics  . Smoking status: Former Smoker    Packs/day: 0.25    Years: 10.00    Types: Cigarettes    Quit date: 02/23/1958  . Smokeless tobacco: Former Systems developer     Comment: quit 1963  . Alcohol use No     Comment: h/o occasional use    Family History  Problem Relation Age of Onset  . Diverticulitis Father   . Depression Brother   . Cancer Paternal Uncle   . Colon cancer Neg Hx    Allergies  Allergen Reactions  . Hydrocodone Other (See Comments)    Reaction:  Hallucinations  . Indocin [Indomethacin] Other (See Comments)    Reaction:  Confusion   . Amoxicillin-Pot Clavulanate Other (See Comments)    Reaction:  Nightmares Has patient had a PCN reaction causing immediate rash, facial/tongue/throat swelling, SOB or lightheadedness with hypotension: No Has patient had a PCN reaction causing severe rash involving mucus membranes or skin necrosis: No Has patient had a PCN reaction that required hospitalization:  No Has patient had a PCN reaction occurring within the last 10 years: Yes If all of the above answers are "NO", then may proceed with Cephalosporin use.  . Erythromycin Ethylsuccinate Other (See Comments)    Reaction:  Eye irritation     OBJECTIVE: Blood pressure (!) 146/79, pulse (!) 47, temperature 97.7 F (36.5 C), temperature source Oral, resp. rate 16, height 5\' 9"  (1.753 m), weight 182 lb 5.1 oz (82.7 kg), SpO2 93 %.  Physical Exam  Constitutional:  He is resting quietly in bed. His caregiver is at the bedside preparing his breakfast.  HENT:  He is hard of hearing.  Cardiovascular: Normal rate and regular rhythm.   No murmur heard. Pulmonary/Chest: Effort normal and breath  sounds normal. He has no wheezes. He has no rales.  Abdominal: Soft. He exhibits no distension. There is no tenderness.  Musculoskeletal: Normal range of motion. He exhibits no edema or tenderness.  Neurological: He is alert.  Skin: No rash noted.    Lab Results Lab Results  Component Value Date   WBC 10.4 10/28/2016   HGB 8.8 (L) 10/28/2016   HCT 26.2 (L) 10/28/2016   MCV 91.3 10/28/2016   PLT 100 (L) 10/28/2016    Lab Results  Component Value Date   CREATININE 1.03 10/28/2016   BUN 14 10/28/2016   NA 145 10/28/2016   K 3.7 10/28/2016   CL 119 (H) 10/28/2016   CO2 23 10/28/2016    Lab Results  Component Value Date   ALT 58 09/16/2016   AST 78 (H) 09/16/2016   ALKPHOS 48 09/16/2016   BILITOT 0.9 09/16/2016     Microbiology: Recent Results (from the past 240 hour(s))  Culture, blood (routine x 2)     Status: Abnormal   Collection Time: 10/24/16  9:59 PM  Result Value Ref Range Status   Specimen Description BLOOD LEFT ANTECUBITAL  Final   Special Requests   Final    BOTTLES DRAWN AEROBIC AND ANAEROBIC Blood Culture adequate volume   Culture  Setup Time   Final    GRAM NEGATIVE RODS IN BOTH AEROBIC AND ANAEROBIC BOTTLES GRAM POSITIVE COCCI IN PAIRS IN CHAINS ANAEROBIC BOTTLE ONLY CRITICAL RESULT CALLED TO, READ BACK BY AND VERIFIED WITH: Yevette Edwards [phone] MLM Performed at [facility], [place] [address], [place], [place] [address]    Culture (A)  Final    ENTEROBACTER CLOACAE CORRECTED RESULTS PREVIOUSLY REPORTED AS: KLEBSIELLA PNEUMONIAE CORRECTED RESULTS CALLED TO: J FRENS 10/27/16 @ 1311 M VESTAL ENTEROCOCCUS FAECALIS    Report Status 10/28/2016 FINAL  Final   Organism ID, Bacteria ENTEROBACTER CLOACAE  Final   Organism ID, Bacteria ENTEROCOCCUS FAECALIS  Final      Susceptibility   Enterobacter cloacae - MIC*    CEFAZOLIN >=64 RESISTANT Resistant     CEFEPIME <=1 SENSITIVE Sensitive     CEFTAZIDIME <=1 SENSITIVE Sensitive     CEFTRIAXONE  8 SENSITIVE Sensitive     CIPROFLOXACIN <=0.25 SENSITIVE Sensitive     GENTAMICIN <=1 SENSITIVE Sensitive     IMIPENEM <=0.25 SENSITIVE Sensitive     TRIMETH/SULFA <=20 SENSITIVE Sensitive     PIP/TAZO <=4 SENSITIVE Sensitive     * ENTEROBACTER CLOACAE   Enterococcus faecalis - MIC*    AMPICILLIN <=2 SENSITIVE Sensitive     VANCOMYCIN 1 SENSITIVE Sensitive     GENTAMICIN SYNERGY SENSITIVE Sensitive     * ENTEROCOCCUS FAECALIS  Blood Culture ID Panel (Reflexed)  Status: Abnormal   Collection Time: 10/24/16  9:59 PM  Result Value Ref Range Status   Enterococcus species NOT DETECTED NOT DETECTED Final   Vancomycin resistance NOT DETECTED NOT DETECTED Final   Listeria monocytogenes NOT DETECTED NOT DETECTED Final   Staphylococcus species NOT DETECTED NOT DETECTED Final   Staphylococcus aureus NOT DETECTED NOT DETECTED Final   Streptococcus species NOT DETECTED NOT DETECTED Final   Streptococcus agalactiae NOT DETECTED NOT DETECTED Final   Streptococcus pneumoniae NOT DETECTED NOT DETECTED Final   Streptococcus pyogenes NOT DETECTED NOT DETECTED Final   Acinetobacter baumannii NOT DETECTED NOT DETECTED Final   Enterobacteriaceae species DETECTED (A) NOT DETECTED Final    Comment: Enterobacteriaceae represent a large family of gram-negative bacteria, not a single organism. CRITICAL RESULT CALLED TO, READ BACK BY AND VERIFIED WITH: PHARMD L POINDEXTER [phone] MLM    Enterobacter cloacae complex DETECTED (A) NOT DETECTED Final    Comment: CRITICAL RESULT CALLED TO, READ BACK BY AND VERIFIED WITH: PHARMD L POINDEXTER [phone] MLM    Escherichia coli NOT DETECTED NOT DETECTED Final   Klebsiella oxytoca NOT DETECTED NOT DETECTED Final   Klebsiella pneumoniae NOT DETECTED NOT DETECTED Final   Proteus species NOT DETECTED NOT DETECTED Final   Serratia marcescens NOT DETECTED NOT DETECTED Final   Carbapenem resistance NOT DETECTED NOT DETECTED Final   Haemophilus influenzae NOT  DETECTED NOT DETECTED Final   Neisseria meningitidis NOT DETECTED NOT DETECTED Final   Pseudomonas aeruginosa NOT DETECTED NOT DETECTED Final   Candida albicans NOT DETECTED NOT DETECTED Final   Candida glabrata NOT DETECTED NOT DETECTED Final   Candida krusei NOT DETECTED NOT DETECTED Final   Candida parapsilosis NOT DETECTED NOT DETECTED Final   Candida tropicalis NOT DETECTED NOT DETECTED Final    Comment: Performed at [facility], [place] [address] [place], [place] [address]  Urine culture     Status: None   Collection Time: 10/24/16 10:04 PM  Result Value Ref Range Status   Specimen Description URINE, RANDOM  Final   Special Requests NONE  Final   Culture   Final    NO GROWTH Performed at [facility], [place] [address], [place], [place] [address]    Report Status 10/27/2016 FINAL  Final  Culture, blood (routine x 2)     Status: Abnormal   Collection Time: 10/24/16 10:06 PM  Result Value Ref Range Status   Specimen Description BLOOD RIGHT ANTECUBITAL  Final   Special Requests   Final    BOTTLES DRAWN AEROBIC AND ANAEROBIC Blood Culture adequate volume   Culture  Setup Time   Final    GRAM NEGATIVE RODS IN BOTH AEROBIC AND ANAEROBIC BOTTLES CRITICAL VALUE NOTED.  VALUE IS CONSISTENT WITH PREVIOUSLY REPORTED AND CALLED VALUE.    Culture (A)  Final    ENTEROBACTER CLOACAE SUSCEPTIBILITIES PERFORMED ON PREVIOUS CULTURE WITHIN THE LAST 5 DAYS. Performed at [facility], [place] [address], [place], [place] [address]    Report Status 10/28/2016 FINAL  Final  MRSA PCR Screening     Status: None   Collection Time: 10/25/16 12:47 AM  Result Value Ref Range Status   MRSA by PCR NEGATIVE NEGATIVE Final    Comment:        The GeneXpert MRSA Assay (FDA approved for NASAL specimens only), is one component of a comprehensive MRSA colonization surveillance program. It is not intended to diagnose MRSA infection nor to guide or monitor treatment  for MRSA infections.      Michel Bickers, MD [facility] [place] [facility] [phone] pager   [phone] cell 10/28/2016, 9:21 AM

## 2016-10-29 MED ORDER — DEXTROSE 5 % IV SOLN
2.0000 g | INTRAVENOUS | Status: DC
  Administered 2016-10-29: 2 g via INTRAVENOUS
  Filled 2016-10-29: qty 2

## 2016-10-29 MED ORDER — ALBUTEROL SULFATE (2.5 MG/3ML) 0.083% IN NEBU: 2.5000 mg | INHALATION_SOLUTION | Freq: Four times a day (QID) | RESPIRATORY_TRACT | Status: DC | PRN

## 2016-10-29 MED ORDER — AMOXICILLIN 250 MG PO CAPS
500.0000 mg | ORAL_CAPSULE | Freq: Two times a day (BID) | ORAL | Status: DC
  Administered 2016-10-29 – 2016-11-02 (×8): 500 mg via ORAL
  Filled 2016-10-29 (×8): qty 2

## 2016-10-29 NOTE — Progress Notes (Signed)
Per chart review, PT recommended SNF. CSW went to speak with patient about PT recommendation, patient's brother, sister-in-law and caregiver present. Patient requested that CSW contact his children to inquire about patient's discharge plans. CSW contacted patient's daughter Hollice Espy [phone]) and informed her about PT recommendation and inquired about patient's discharge plans. Patient's daughter reported that they might be transferring patient to [facility] and requested that CSW hold off on discharge planning with patient. CSW informed patient's daughter that she could contact CSW if needed. CSW provided patient with update after speaking with patient's daughter, patient appeared surprised and replied okay. CSW informed patient that CSW was available if needed.    Abundio Miu, [place] Social Worker [facility] Cell#: [phone]

## 2016-10-29 NOTE — Progress Notes (Signed)
PROGRESS NOTE    Ricky Boyd  GYB:[id number] DOB: 1941-02-07 DOA: 10/24/2016 PCP: Lucille Passy, MD   Brief Narrative: 76 yo male admitted with CAUTI.ID notes reviwed.    Assessment & Plan:   Principal Problem:   Sepsis secondary to UTI [facility]) Active Problems:   GOUT   Malignant neoplasm of prostate ([place])   Acute encephalopathy   Dementia   Urinary retention   Hematuria   Hyperglycemia   Thrombocytopenia (HCC)   Normocytic anemia   Enterococcal bacteremia   Bacteremia due to Enterobacter species sepsis /bacteremia secondary to uti.will continue rocephin for 1 more day and switch to Amoxicillin for Another 5 days.  Enterobacter bacteremia treatment as above.  Prostate malignancy patient to follow-up at [facility] urology oncology.        DVT prophylaxis: scd Code Status: dnr Family Communication: Disposition Plan:  Consultants: id  Procedures:   Antimicrobialsrocephin   Subjective:no new complaints   Objective: Vitals:   10/29/16 0459 10/29/16 0900 10/29/16 1346 10/29/16 1447  BP: 137/73 140/78 128/72   Pulse: (!) 48 (!) 54 (!) 50   Resp: 18 20 18    Temp: 97.9 F (36.6 C) 97.7 F (36.5 C) 98.2 F (36.8 C) 97.6 F (36.4 C)  TempSrc: Oral Oral Oral Oral  SpO2: 96% 96% 98%   Weight:      Height:        Intake/Output Summary (Last 24 hours) at 10/29/16 1548 Last data filed at 10/29/16 1400  Gross per 24 hour  Intake          3258.33 ml  Output             2300 ml  Net           958.33 ml   Filed Weights   10/24/16 2144 10/27/16 0343  Weight: 81.6 kg (180 lb) 82.7 kg (182 lb 5.1 oz)    Examination:  General exam: Appears calm and comfortable  Respiratory system: Clear to auscultation. Respiratory effort normal. Cardiovascular system: S1 & S2 heard, RRR. No JVD, murmurs, rubs, gallops or clicks. No pedal edema. Gastrointestinal system: Abdomen is nondistended, soft and nontender. No organomegaly or masses felt. Normal bowel sounds  heard. Central nervous system: Alert and oriented. No focal neurological deficits. Extremities: Symmetric 5 x 5 power. Skin: No rashes, lesions or ulcers Psychiatry: Judgement and insight appear normal. Mood & affect appropriate.     Data Reviewed: I have personally reviewed following labs and imaging studies  CBC:  Recent Labs Lab 10/24/16 2143 10/25/16 0350 10/26/16 0252 10/27/16 0254 10/28/16 0742  WBC 3.9* 12.4* 14.3* 12.9* 10.4  NEUTROABS 3.8  --   --   --   --   HGB 11.9* 8.9* 8.6* 8.7* 8.8*  HCT 35.2* 25.1* 25.1* 24.9* 26.2*  MCV 91.9 90.9 90.9 92.2 91.3  PLT 110* 90* 87* 79* 876*   Basic Metabolic Panel:  Recent Labs Lab 10/24/16 2143 10/25/16 0350 10/26/16 0252 10/27/16 0254 10/28/16 0742  NA 142 139 144 145 145  K 3.4* 3.0* 4.2 3.3* 3.7  CL 107 110 117* 119* 119*  CO2 24 20* 22 18* 23  GLUCOSE 142* 134* 139* 171* 115*  BUN 15 17 17 14 14   CREATININE 1.38* 1.37* 1.03 0.88 1.03  CALCIUM 8.8* 7.7* 7.9* 8.0* 8.2*  MG  --  1.1*  --   --   --    GFR: Estimated Creatinine Clearance: 59.1 mL/min (by C-G formula based on SCr of 1.03 mg/dL).  Liver Function Tests: No results for input(s): AST, ALT, ALKPHOS, BILITOT, PROT, ALBUMIN in the last 168 hours. No results for input(s): LIPASE, AMYLASE in the last 168 hours. No results for input(s): AMMONIA in the last 168 hours. Coagulation Profile:  Recent Labs Lab 10/25/16 0117  INR 1.28   Cardiac Enzymes: No results for input(s): CKTOTAL, CKMB, CKMBINDEX, TROPONINI in the last 168 hours. BNP (last 3 results) No results for input(s): PROBNP in the last 8760 hours. HbA1C: No results for input(s): HGBA1C in the last 72 hours. CBG: No results for input(s): GLUCAP in the last 168 hours. Lipid Profile: No results for input(s): CHOL, HDL, LDLCALC, TRIG, CHOLHDL, LDLDIRECT in the last 72 hours. Thyroid Function Tests: No results for input(s): TSH, T4TOTAL, FREET4, T3FREE, THYROIDAB in the last 72 hours. Anemia  Panel: No results for input(s): VITAMINB12, FOLATE, FERRITIN, TIBC, IRON, RETICCTPCT in the last 72 hours. Sepsis Labs:  Recent Labs Lab 10/25/16 0117  10/25/16 1429 10/25/16 1839 10/26/16 0918 10/26/16 1201  PROCALCITON 51.32  --   --   --   --   --   LATICACIDVEN 5.4*  < > 3.0* 2.7* 2.0* 2.1*  < > = values in this interval not displayed.  Recent Results (from the past 240 hour(s))  Culture, blood (routine x 2)     Status: Abnormal   Collection Time: 10/24/16  9:59 PM  Result Value Ref Range Status   Specimen Description BLOOD LEFT ANTECUBITAL  Final   Special Requests   Final    BOTTLES DRAWN AEROBIC AND ANAEROBIC Blood Culture adequate volume   Culture  Setup Time   Final    GRAM NEGATIVE RODS IN BOTH AEROBIC AND ANAEROBIC BOTTLES GRAM POSITIVE COCCI IN PAIRS IN CHAINS ANAEROBIC BOTTLE ONLY CRITICAL RESULT CALLED TO, READ BACK BY AND VERIFIED WITH: Yevette Edwards [phone] MLM Performed at [facility], [place] [address], [place], [place] [address]    Culture (A)  Final    ENTEROBACTER CLOACAE CORRECTED RESULTS PREVIOUSLY REPORTED AS: KLEBSIELLA PNEUMONIAE CORRECTED RESULTS CALLED TO: J FRENS 10/27/16 @ 1311 M VESTAL ENTEROCOCCUS FAECALIS    Report Status 10/28/2016 FINAL  Final   Organism ID, Bacteria ENTEROBACTER CLOACAE  Final   Organism ID, Bacteria ENTEROCOCCUS FAECALIS  Final      Susceptibility   Enterobacter cloacae - MIC*    CEFAZOLIN >=64 RESISTANT Resistant     CEFEPIME <=1 SENSITIVE Sensitive     CEFTAZIDIME <=1 SENSITIVE Sensitive     CEFTRIAXONE 8 SENSITIVE Sensitive     CIPROFLOXACIN <=0.25 SENSITIVE Sensitive     GENTAMICIN <=1 SENSITIVE Sensitive     IMIPENEM <=0.25 SENSITIVE Sensitive     TRIMETH/SULFA <=20 SENSITIVE Sensitive     PIP/TAZO <=4 SENSITIVE Sensitive     * ENTEROBACTER CLOACAE   Enterococcus faecalis - MIC*    AMPICILLIN <=2 SENSITIVE Sensitive     VANCOMYCIN 1 SENSITIVE Sensitive     GENTAMICIN SYNERGY SENSITIVE  Sensitive     * ENTEROCOCCUS FAECALIS  Blood Culture ID Panel (Reflexed)     Status: Abnormal   Collection Time: 10/24/16  9:59 PM  Result Value Ref Range Status   Enterococcus species NOT DETECTED NOT DETECTED Final   Vancomycin resistance NOT DETECTED NOT DETECTED Final   Listeria monocytogenes NOT DETECTED NOT DETECTED Final   Staphylococcus species NOT DETECTED NOT DETECTED Final   Staphylococcus aureus NOT DETECTED NOT DETECTED Final   Streptococcus species NOT DETECTED NOT DETECTED Final  Streptococcus agalactiae NOT DETECTED NOT DETECTED Final   Streptococcus pneumoniae NOT DETECTED NOT DETECTED Final   Streptococcus pyogenes NOT DETECTED NOT DETECTED Final   Acinetobacter baumannii NOT DETECTED NOT DETECTED Final   Enterobacteriaceae species DETECTED (A) NOT DETECTED Final    Comment: Enterobacteriaceae represent a large family of gram-negative bacteria, not a single organism. CRITICAL RESULT CALLED TO, READ BACK BY AND VERIFIED WITH: PHARMD L POINDEXTER [phone] MLM    Enterobacter cloacae complex DETECTED (A) NOT DETECTED Final    Comment: CRITICAL RESULT CALLED TO, READ BACK BY AND VERIFIED WITH: PHARMD L POINDEXTER [phone] MLM    Escherichia coli NOT DETECTED NOT DETECTED Final   Klebsiella oxytoca NOT DETECTED NOT DETECTED Final   Klebsiella pneumoniae NOT DETECTED NOT DETECTED Final   Proteus species NOT DETECTED NOT DETECTED Final   Serratia marcescens NOT DETECTED NOT DETECTED Final   Carbapenem resistance NOT DETECTED NOT DETECTED Final   Haemophilus influenzae NOT DETECTED NOT DETECTED Final   Neisseria meningitidis NOT DETECTED NOT DETECTED Final   Pseudomonas aeruginosa NOT DETECTED NOT DETECTED Final   Candida albicans NOT DETECTED NOT DETECTED Final   Candida glabrata NOT DETECTED NOT DETECTED Final   Candida krusei NOT DETECTED NOT DETECTED Final   Candida parapsilosis NOT DETECTED NOT DETECTED Final   Candida tropicalis NOT DETECTED NOT DETECTED  Final    Comment: Performed at [facility], [place] [address] [place], [place] [address]  Urine culture     Status: None   Collection Time: 10/24/16 10:04 PM  Result Value Ref Range Status   Specimen Description URINE, RANDOM  Final   Special Requests NONE  Final   Culture   Final    NO GROWTH Performed at [facility], [place] [address] [place], [place] [address]    Report Status 10/27/2016 FINAL  Final  Culture, blood (routine x 2)     Status: Abnormal   Collection Time: 10/24/16 10:06 PM  Result Value Ref Range Status   Specimen Description BLOOD RIGHT ANTECUBITAL  Final   Special Requests   Final    BOTTLES DRAWN AEROBIC AND ANAEROBIC Blood Culture adequate volume   Culture  Setup Time   Final    GRAM NEGATIVE RODS IN BOTH AEROBIC AND ANAEROBIC BOTTLES CRITICAL VALUE NOTED.  VALUE IS CONSISTENT WITH PREVIOUSLY REPORTED AND CALLED VALUE.    Culture (A)  Final    ENTEROBACTER CLOACAE SUSCEPTIBILITIES PERFORMED ON PREVIOUS CULTURE WITHIN THE LAST 5 DAYS. Performed at [facility], [place] [address] [place], [place] [address]    Report Status 10/28/2016 FINAL  Final  MRSA PCR Screening     Status: None   Collection Time: 10/25/16 12:47 AM  Result Value Ref Range Status   MRSA by PCR NEGATIVE NEGATIVE Final    Comment:        The GeneXpert MRSA Assay (FDA approved for NASAL specimens only), is one component of a comprehensive MRSA colonization surveillance program. It is not intended to diagnose MRSA infection nor to guide or monitor treatment for MRSA infections.   Culture, blood (routine x 2)     Status: None (Preliminary result)   Collection Time: 10/28/16  2:53 PM  Result Value Ref Range Status   Specimen Description RIGHT ANTECUBITAL  Final   Special Requests   Final    BOTTLES DRAWN AEROBIC ONLY Blood Culture adequate volume   Culture   Final    NO GROWTH < 24 HOURS  Performed at [facility], [place] [address], [place], [place] [address]     Report Status PENDING  Incomplete  Culture, blood (routine x 2)     Status: None (Preliminary result)   Collection Time: 10/28/16  2:59 PM  Result Value Ref Range Status   Specimen Description RIGHT ANTECUBITAL  Final   Special Requests   Final    BOTTLES DRAWN AEROBIC ONLY Blood Culture adequate volume   Culture   Final    NO GROWTH < 24 HOURS Performed at [facility], [place] [address] [place], [place] [address]    Report Status PENDING  Incomplete         Radiology Studies: No results found.      Scheduled Meds: . amoxicillin  500 mg Oral Q12H  . colchicine  0.6 mg Oral Daily  . donepezil  10 mg Oral QHS  . hydrocortisone sod succinate (SOLU-CORTEF) inj  100 mg Intravenous Q12H  . memantine  28 mg Oral Daily  . QUEtiapine  12.5 mg Oral QHS  . tamsulosin  0.4 mg Oral QPC supper   Continuous Infusions: . sodium chloride 125 mL/hr at 10/29/16 0041  . cefTRIAXone (ROCEPHIN)  IV       LOS: 5 days     Georgette Shell, MD [facility]   If 7PM-7AM, please contact night-coverage [email or web address] Password [facility] 10/29/2016, 3:48 PM

## 2016-10-29 NOTE — Care Management Important Message (Signed)
Important Message  Patient Details  Name: Erving Sassano MRN: [id number] Date of Birth: [birth year]   Medicare Important Message Given:  Yes    Kerin Salen 10/29/2016, 11:03 AMImportant Message  Patient Details  Name: Orie Baxendale MRN: [id number] Date of Birth: Aug 17, 1940   Medicare Important Message Given:  Yes    Kerin Salen 10/29/2016, 11:03 AM

## 2016-10-29 NOTE — Progress Notes (Signed)
Patient ID: Ricky Boyd, male   DOB: [birth year], 76 y.o.   MRN: [id number]          [facility]  Date of Admission:  10/24/2016    Total days of antibiotics 6        Day 2 piperacillin tazobactam         ASSESSMENT: He has Enterobacter bacteremia, most likely due to a bladder infection and recent urethral trauma from pulling his Foley catheter out. He has now had 6 days of therapy and has defervesced. I will narrow his Enterobacter therapy to ceftriaxone and recommend 1 more day. He also had enterococcus in one of 2 blood cultures. This may be an insignificant finding but I will put him on amoxicillin and plan on 5 more days of therapy.  PLAN: 1. Discontinue piperacillin tazobactam 2. Give ceftriaxone for 1 more day 3. Give oral amoxicillin for 5 more days 4. I will sign off now  Principal Problem:   Sepsis secondary to UTI [facility]) Active Problems:   Enterococcal bacteremia   Bacteremia due to Enterobacter species   GOUT   Malignant neoplasm of prostate ([place])   Acute encephalopathy   Dementia   Urinary retention   Hematuria   Hyperglycemia   Thrombocytopenia (HCC)   Normocytic anemia   . colchicine  0.6 mg Oral Daily  . donepezil  10 mg Oral QHS  . hydrocortisone sod succinate (SOLU-CORTEF) inj  100 mg Intravenous Q12H  . memantine  28 mg Oral Daily  . QUEtiapine  12.5 mg Oral QHS  . tamsulosin  0.4 mg Oral QPC supper    SUBJECTIVE: He is upset that he just inadvertently pulled his left forearm IV out.  Review of Systems: Review of Systems  Constitutional: Negative for chills, diaphoresis and fever.  Respiratory: Negative for cough.   Cardiovascular: Negative for chest pain.  Gastrointestinal: Negative for abdominal pain, diarrhea, nausea and vomiting.    Allergies  Allergen Reactions  . Hydrocodone Other (See Comments)    Reaction:  Hallucinations  . Indocin [Indomethacin] Other (See Comments)    Reaction:  Confusion   .  Amoxicillin-Pot Clavulanate Other (See Comments)    Reaction:  Nightmares Has patient had a PCN reaction causing immediate rash, facial/tongue/throat swelling, SOB or lightheadedness with hypotension: No Has patient had a PCN reaction causing severe rash involving mucus membranes or skin necrosis: No Has patient had a PCN reaction that required hospitalization: No Has patient had a PCN reaction occurring within the last 10 years: Yes If all of the above answers are "NO", then may proceed with Cephalosporin use.  . Erythromycin Ethylsuccinate Other (See Comments)    Reaction:  Eye irritation     OBJECTIVE: Vitals:   10/28/16 2121 10/29/16 0459 10/29/16 0900 10/29/16 1346  BP: 124/78 137/73 140/78 128/72  Pulse: (!) 52 (!) 48 (!) 54 (!) 50  Resp: 18 18 20 18   Temp: 98 F (36.7 C) 97.9 F (36.6 C) 97.7 F (36.5 C) 98.2 F (36.8 C)  TempSrc: Oral Oral Oral Oral  SpO2: 97% 96% 96% 98%  Weight:      Height:       Body mass index is 26.92 kg/m.  Physical Exam  Constitutional: He is oriented to person, place, and time.  He is alert and in no distress. His caregiver and nurse are at the bedside.  Cardiovascular: Normal rate and regular rhythm.   No murmur heard. Pulmonary/Chest: Effort normal and breath sounds normal.  Abdominal: Soft. There is no tenderness.  Neurological: He is alert and oriented to person, place, and time.  Psychiatric: Mood and affect normal.    Lab Results Lab Results  Component Value Date   WBC 10.4 10/28/2016   HGB 8.8 (L) 10/28/2016   HCT 26.2 (L) 10/28/2016   MCV 91.3 10/28/2016   PLT 100 (L) 10/28/2016    Lab Results  Component Value Date   CREATININE 1.03 10/28/2016   BUN 14 10/28/2016   NA 145 10/28/2016   K 3.7 10/28/2016   CL 119 (H) 10/28/2016   CO2 23 10/28/2016    Lab Results  Component Value Date   ALT 58 09/16/2016   AST 78 (H) 09/16/2016   ALKPHOS 48 09/16/2016   BILITOT 0.9 09/16/2016     Microbiology: Recent Results  (from the past 240 hour(s))  Culture, blood (routine x 2)     Status: Abnormal   Collection Time: 10/24/16  9:59 PM  Result Value Ref Range Status   Specimen Description BLOOD LEFT ANTECUBITAL  Final   Special Requests   Final    BOTTLES DRAWN AEROBIC AND ANAEROBIC Blood Culture adequate volume   Culture  Setup Time   Final    GRAM NEGATIVE RODS IN BOTH AEROBIC AND ANAEROBIC BOTTLES GRAM POSITIVE COCCI IN PAIRS IN CHAINS ANAEROBIC BOTTLE ONLY CRITICAL RESULT CALLED TO, READ BACK BY AND VERIFIED WITH: Yevette Edwards [phone] MLM Performed at [facility], [place] [address], [place], [place] [address]    Culture (A)  Final    ENTEROBACTER CLOACAE CORRECTED RESULTS PREVIOUSLY REPORTED AS: KLEBSIELLA PNEUMONIAE CORRECTED RESULTS CALLED TO: J FRENS 10/27/16 @ 1311 M VESTAL ENTEROCOCCUS FAECALIS    Report Status 10/28/2016 FINAL  Final   Organism ID, Bacteria ENTEROBACTER CLOACAE  Final   Organism ID, Bacteria ENTEROCOCCUS FAECALIS  Final      Susceptibility   Enterobacter cloacae - MIC*    CEFAZOLIN >=64 RESISTANT Resistant     CEFEPIME <=1 SENSITIVE Sensitive     CEFTAZIDIME <=1 SENSITIVE Sensitive     CEFTRIAXONE 8 SENSITIVE Sensitive     CIPROFLOXACIN <=0.25 SENSITIVE Sensitive     GENTAMICIN <=1 SENSITIVE Sensitive     IMIPENEM <=0.25 SENSITIVE Sensitive     TRIMETH/SULFA <=20 SENSITIVE Sensitive     PIP/TAZO <=4 SENSITIVE Sensitive     * ENTEROBACTER CLOACAE   Enterococcus faecalis - MIC*    AMPICILLIN <=2 SENSITIVE Sensitive     VANCOMYCIN 1 SENSITIVE Sensitive     GENTAMICIN SYNERGY SENSITIVE Sensitive     * ENTEROCOCCUS FAECALIS  Blood Culture ID Panel (Reflexed)     Status: Abnormal   Collection Time: 10/24/16  9:59 PM  Result Value Ref Range Status   Enterococcus species NOT DETECTED NOT DETECTED Final   Vancomycin resistance NOT DETECTED NOT DETECTED Final   Listeria monocytogenes NOT DETECTED NOT DETECTED Final   Staphylococcus species NOT DETECTED NOT  DETECTED Final   Staphylococcus aureus NOT DETECTED NOT DETECTED Final   Streptococcus species NOT DETECTED NOT DETECTED Final   Streptococcus agalactiae NOT DETECTED NOT DETECTED Final   Streptococcus pneumoniae NOT DETECTED NOT DETECTED Final   Streptococcus pyogenes NOT DETECTED NOT DETECTED Final   Acinetobacter baumannii NOT DETECTED NOT DETECTED Final   Enterobacteriaceae species DETECTED (A) NOT DETECTED Final    Comment: Enterobacteriaceae represent a large family of gram-negative bacteria, not a single organism. CRITICAL RESULT CALLED TO, READ BACK BY AND VERIFIED WITH: PHARMD L POINDEXTER  [phone] MLM    Enterobacter cloacae complex DETECTED (A) NOT DETECTED Final    Comment: CRITICAL RESULT CALLED TO, READ BACK BY AND VERIFIED WITH: PHARMD L POINDEXTER [phone] MLM    Escherichia coli NOT DETECTED NOT DETECTED Final   Klebsiella oxytoca NOT DETECTED NOT DETECTED Final   Klebsiella pneumoniae NOT DETECTED NOT DETECTED Final   Proteus species NOT DETECTED NOT DETECTED Final   Serratia marcescens NOT DETECTED NOT DETECTED Final   Carbapenem resistance NOT DETECTED NOT DETECTED Final   Haemophilus influenzae NOT DETECTED NOT DETECTED Final   Neisseria meningitidis NOT DETECTED NOT DETECTED Final   Pseudomonas aeruginosa NOT DETECTED NOT DETECTED Final   Candida albicans NOT DETECTED NOT DETECTED Final   Candida glabrata NOT DETECTED NOT DETECTED Final   Candida krusei NOT DETECTED NOT DETECTED Final   Candida parapsilosis NOT DETECTED NOT DETECTED Final   Candida tropicalis NOT DETECTED NOT DETECTED Final    Comment: Performed at [facility], [place] [address] [place], [place] [address]  Urine culture     Status: None   Collection Time: 10/24/16 10:04 PM  Result Value Ref Range Status   Specimen Description URINE, RANDOM  Final   Special Requests NONE  Final   Culture   Final    NO GROWTH Performed at [facility], [place] [address] [place], [place]  [address]    Report Status 10/27/2016 FINAL  Final  Culture, blood (routine x 2)     Status: Abnormal   Collection Time: 10/24/16 10:06 PM  Result Value Ref Range Status   Specimen Description BLOOD RIGHT ANTECUBITAL  Final   Special Requests   Final    BOTTLES DRAWN AEROBIC AND ANAEROBIC Blood Culture adequate volume   Culture  Setup Time   Final    GRAM NEGATIVE RODS IN BOTH AEROBIC AND ANAEROBIC BOTTLES CRITICAL VALUE NOTED.  VALUE IS CONSISTENT WITH PREVIOUSLY REPORTED AND CALLED VALUE.    Culture (A)  Final    ENTEROBACTER CLOACAE SUSCEPTIBILITIES PERFORMED ON PREVIOUS CULTURE WITHIN THE LAST 5 DAYS. Performed at [facility], [place] [address], [place], [place] [address]    Report Status 10/28/2016 FINAL  Final  MRSA PCR Screening     Status: None   Collection Time: 10/25/16 12:47 AM  Result Value Ref Range Status   MRSA by PCR NEGATIVE NEGATIVE Final    Comment:        The GeneXpert MRSA Assay (FDA approved for NASAL specimens only), is one component of a comprehensive MRSA colonization surveillance program. It is not intended to diagnose MRSA infection nor to guide or monitor treatment for MRSA infections.   Culture, blood (routine x 2)     Status: None (Preliminary result)   Collection Time: 10/28/16  2:53 PM  Result Value Ref Range Status   Specimen Description RIGHT ANTECUBITAL  Final   Special Requests   Final    BOTTLES DRAWN AEROBIC ONLY Blood Culture adequate volume   Culture   Final    NO GROWTH < 24 HOURS Performed at [facility], [address] [place],  [address]    Report Status PENDING  Incomplete  Culture, blood (routine x 2)     Status: None (Preliminary result)   Collection Time: 10/28/16  2:59 PM  Result Value Ref Range Status   Specimen Description RIGHT ANTECUBITAL  Final   Special Requests   Final    BOTTLES DRAWN AEROBIC ONLY Blood  Culture adequate volume   Culture   Final    NO GROWTH < 24 HOURS Performed at [facility], [place] [address], [place], [place] [address]    Report Status PENDING  Incomplete    Michel Bickers, MD [facility] [place] [facility] [phone] pager   [phone] cell 10/29/2016, 2:36 PM

## 2016-10-30 MED ORDER — ALBUTEROL SULFATE (2.5 MG/3ML) 0.083% IN NEBU: 2.5000 mg | INHALATION_SOLUTION | Freq: Four times a day (QID) | RESPIRATORY_TRACT | 12 refills | Status: DC | PRN

## 2016-10-30 MED ORDER — AMOXICILLIN 500 MG PO CAPS: 500.0000 mg | ORAL_CAPSULE | Freq: Two times a day (BID) | ORAL | 0 refills | Status: DC

## 2016-10-30 MED ORDER — DONEPEZIL HCL 10 MG PO TABS: 10.0000 mg | ORAL_TABLET | Freq: Every day | ORAL | 0 refills | Status: DC

## 2016-10-30 MED ORDER — COLCHICINE 0.6 MG PO TABS: 0.6000 mg | ORAL_TABLET | Freq: Every day | ORAL | 0 refills | Status: DC

## 2016-10-30 NOTE — Progress Notes (Signed)
Occupational Therapy Treatment Patient Details Name: Ricky Boyd MRN: [id number] DOB: 05/24/1940 Today's Date: 10/30/2016    History of present illness 76 y.o. male with medical history significant of prostate cancer, mild dementia, and gout presenting with gross hematuria.  About 4:30 on 9/1 his caregiver arrived and he was not wearing his catheter bag.  He had been wearing it at 4:15 when his son left.  There was blood around the commode where he had removed it.  She put the catheter bag back on and it filled with blood. Previous admission for UTI on 7/25-31. More confused than usual while in the ER only.    OT comments  Pt progressing towards goals, completed room level functional mobility at RW level with MinA with increased assist for sit<>stand. Pt completed toileting with MaxA and standing grooming ADLs with Min steady assist, increased time to complete. Will continue to follow acutely and continue to recommend SNF stay prior to return home to maximize Pt's strength, safety, and independence with ADLs and functional mobility.    Follow Up Recommendations  SNF;Supervision/Assistance - 24 hour    Equipment Recommendations  3 in 1 bedside commode;Other (comment) (to be further assessed in next venue )          Precautions / Restrictions Precautions Precautions: Fall Precaution Comments: dementia Restrictions Weight Bearing Restrictions: No       Mobility Bed Mobility Overal bed mobility: Needs Assistance Bed Mobility: Supine to Sit     Supine to sit: Min guard;HOB elevated     General bed mobility comments: close guard for safety and increased time/effort  Transfers Overall transfer level: Needs assistance Equipment used: Rolling walker (2 wheeled) Transfers: Sit to/from Stand Sit to Stand: Mod assist         General transfer comment: multimodal cues, assist to power  up to stand, steady assist.    Balance Overall balance assessment: Needs  assistance Sitting-balance support: Bilateral upper extremity supported;Feet supported Sitting balance-Leahy Scale: Fair     Standing balance support: Bilateral upper extremity supported;During functional activity Standing balance-Leahy Scale: Fair Standing balance comment: reliant on UE support during mobility; able to maintain static standing at sink to wash hands with Min steady assist from therapist                            ADL either performed or assessed with clinical judgement   ADL Overall ADL's : Needs assistance/impaired     Grooming: Wash/dry hands;Minimal assistance;Standing                   Toilet Transfer: Ambulation;RW;Moderate assistance;Regular Toilet;Grab bars Toilet Transfer Details (indicate cue type and reason): ModA to rise from regular height toilet  Toileting- Clothing Manipulation and Hygiene: Maximal assistance;Sit to/from stand Toileting - Clothing Manipulation Details (indicate cue type and reason): assist for perihygiene after BM      Functional mobility during ADLs: Minimal assistance;Rolling walker                         Cognition Arousal/Alertness: Awake/alert Behavior During Therapy: WFL for tasks assessed/performed Overall Cognitive Status: History of cognitive impairments - at baseline                     Current Attention Level: Selective Memory: Decreased short-term memory Following Commands: Follows one step commands with increased time       General Comments: requries multimodal  cues and increased time                     General Comments Pt caregiver present during session     Pertinent Vitals/ Pain       Pain Assessment: No/denies pain                                                          Frequency  Min 2X/week        Progress Toward Goals  OT Goals(current goals can now be found in the care plan section)  Progress towards OT goals: Progressing  toward goals  Acute Rehab OT Goals Patient Stated Goal: to go home OT Goal Formulation: With patient Time For Goal Achievement: 11/11/16 Potential to Achieve Goals: Good  Plan Discharge plan remains appropriate                     AM-PAC PT "6 Clicks" Daily Activity     Outcome Measure   Help from another person eating meals?: A Little Help from another person taking care of personal grooming?: A Little Help from another person toileting, which includes using toliet, bedpan, or urinal?: A Lot Help from another person bathing (including washing, rinsing, drying)?: A Lot Help from another person to put on and taking off regular upper body clothing?: A Little Help from another person to put on and taking off regular lower body clothing?: A Lot 6 Click Score: 15    End of Session Equipment Utilized During Treatment: Gait belt;Rolling walker  OT Visit Diagnosis: Unsteadiness on feet (R26.81);Muscle weakness (generalized) (M62.81)   Activity Tolerance Patient tolerated treatment well   Patient Left in chair;with call bell/phone within reach;with chair alarm set;with family/visitor present   Nurse Communication Mobility status        Time: 7482-7078 OT Time Calculation (min): 31 min  Charges: OT General Charges $OT Visit: 1 Visit OT Treatments $Self Care/Home Management : 23-37 mins  Ricky Boyd, OT Pager [phone] 10/30/2016   Ricky Boyd 10/30/2016, 1:28 PM

## 2016-10-30 NOTE — Progress Notes (Signed)
Patients HR dropped to 38, nonsustaining. T. Opyd MD aware.

## 2016-10-30 NOTE — Progress Notes (Signed)
Physical Therapy Treatment Patient Details Name: Ricky Boyd MRN: [id number] DOB: Nov 26, 1940 Today's Date: 10/30/2016    History of Present Illness 76 y.o. male with medical history significant of prostate cancer, mild dementia, and gout presenting with gross hematuria.  About 4:30 on 9/1 his caregiver arrived and he was not wearing his catheter bag.  He had been wearing it at 4:15 when his son left.  There was blood around the commode where he had removed it.  She put the catheter bag back on and it filled with blood. Previous admission for UTI on 7/25-31. More confused than usual while in the ER only.     PT Comments    Pt sitting in recliner and requesting back to bed upon arrival.  Pt agreeable to ambulate before returning to bed and tolerated improved distance today well.  Pt requires some assist for mobility so continue to recommend SNF if family does not feel able to assist at home.  Per chart, [facility] transfer [facility]?   Follow Up Recommendations  SNF;Supervision/Assistance - 24 hour     Equipment Recommendations  None recommended by PT    Recommendations for Other Services       Precautions / Restrictions Precautions Precautions: Fall Precaution Comments: dementia Restrictions Weight Bearing Restrictions: No    Mobility  Bed Mobility Overal bed mobility: Needs Assistance Bed Mobility: Sit to Supine     Supine to sit: Min guard;HOB elevated Sit to supine: Min assist   General bed mobility comments: slight assist for LEs onto bed  Transfers Overall transfer level: Needs assistance Equipment used: Rolling walker (2 wheeled) Transfers: Sit to/from Stand Sit to Stand: Mod assist         General transfer comment: multimodal cues, assist to power up to stand, steady assist.  pt able to control descent with verbal cues  Ambulation/Gait Ambulation/Gait assistance: Min assist;Min guard Ambulation Distance (Feet): 120 Feet Assistive device: Rolling walker (2  wheeled) Gait Pattern/deviations: Step-through pattern;Decreased stride length;Trunk flexed Gait velocity: decr   General Gait Details: initially min assist however progressed to min/guard, cues for RW positioning, pt reports slight SOB, SpO2 98% on room air upon return to supine, HR 69 bpm pregait and 63 bpm postgait   Stairs            Wheelchair Mobility    Modified Rankin (Stroke Patients Only)       Balance Overall balance assessment: Needs assistance Sitting-balance support: Bilateral upper extremity supported;Feet supported Sitting balance-Leahy Scale: Fair     Standing balance support: Bilateral upper extremity supported;During functional activity Standing balance-Leahy Scale: Fair Standing balance comment: reliant on UE support during mobility; able to maintain static standing at sink to wash hands with Min steady assist from therapist                             Cognition Arousal/Alertness: Awake/alert Behavior During Therapy: WFL for tasks assessed/performed Overall Cognitive Status: History of cognitive impairments - at baseline                     Current Attention Level: Selective Memory: Decreased short-term memory Following Commands: Follows one step commands with increased time       General Comments: requries multimodal cues and increased time, HOH as well       Exercises      General Comments General comments (skin integrity, edema, etc.): Pt caregiver present during session  Pertinent Vitals/Pain Pain Assessment: No/denies pain    Home Living                      Prior Function            PT Goals (current goals can now be found in the care plan section) Acute Rehab PT Goals Patient Stated Goal: to go home Progress towards PT goals: Progressing toward goals    Frequency    Min 2X/week      PT Plan Current plan remains appropriate    Co-evaluation              AM-PAC PT "6  Clicks" Daily Activity  Outcome Measure  Difficulty turning over in bed (including adjusting bedclothes, sheets and blankets)?: Unable Difficulty moving from lying on back to sitting on the side of the bed? : Unable Difficulty sitting down on and standing up from a chair with arms (e.g., wheelchair, bedside commode, etc,.)?: Unable Help needed moving to and from a bed to chair (including a wheelchair)?: A Lot Help needed walking in hospital room?: A Lot Help needed climbing 3-5 steps with a railing? : Total 6 Click Score: 8    End of Session Equipment Utilized During Treatment: Gait belt Activity Tolerance: Patient tolerated treatment well Patient left: in bed;with call bell/phone within reach;with bed alarm set   PT Visit Diagnosis: Difficulty in walking, not elsewhere classified (R26.2)     Time: 0175-1025 PT Time Calculation (min) (ACUTE ONLY): 14 min  Charges:  $Gait Training: 8-22 mins                    G Codes:       Carmelia Bake, PT, DPT 10/30/2016 Pager: [phone]  York Ram E 10/30/2016, 3:34 PM

## 2016-10-30 NOTE — Progress Notes (Signed)
CSW spoke with pt's family along with attending MD. They agreed to seek SNF placement. Both son and daughter are designated agents on healthcare power of attorney paperwork. Daughter requests "that she be contacted first over brother." CSW explained both can be contacted re: decisions as both are listed.  Son requests [facility] (pt has been to rehab there previously) or [facility], while daughter prefers elsewhere. CSW explained that all bed offers made will be presented and pt/family can come to agreement on facility choice. They agree.   Completed FL2 and referred to [place] area SNFs. Will follow up with bed offers.   Sharren Bridge, MSW, LCSW Clinical Social Work 10/30/2016 [phone]

## 2016-10-30 NOTE — NC FL2 (Signed)
[place] MEDICAID FL2 LEVEL OF CARE SCREENING TOOL     IDENTIFICATION  Patient Name: Ricky Boyd Birthdate: May 07, 1940 Sex: male Admission Date (Current Location): 10/24/2016  [facility] and [place] Number:  Herbalist and Address:  [facility]  [place], [place]      Provider Number: [id number]  Attending Physician Name and Address:  Georgette Shell, MD  Relative Name and Phone Number:       Current Level of Care: Hospital Recommended Level of Care: [place] Prior Approval Number:    Date Approved/Denied:   PASRR Number: [id number]  Discharge Plan: SNF    Current Diagnoses: Patient Active Problem List   Diagnosis Date Noted  . Normocytic anemia 10/28/2016  . Enterococcal bacteremia 10/28/2016  . Bacteremia due to Enterobacter species 10/28/2016  . Sepsis secondary to UTI ([place]) 10/25/2016  . Hyperglycemia 10/25/2016  . Thrombocytopenia ([place]) 10/25/2016  . Hematuria 10/24/2016  . Urinary retention 10/08/2016  . Acute encephalopathy 09/16/2016  . Dementia 09/16/2016  . Urinary tract infection without hematuria   . Dehydration   . Malignant neoplasm of prostate ([place]) 04/23/2016  . Erectile dysfunction 07/23/2015  . 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 Elevated PSA, less than 10 ng/ml 06/27/2012  . GOUT 12/26/2008  . 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 History of colonic polyps 10/28/2006  . 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    Orientation RESPIRATION BLADDER Height & Weight     Self, Time, Situation, Place  Normal Indwelling catheter Weight: 182 lb 5.1 oz (82.7 kg) Height:  5\' 9"  (175.3 cm)  BEHAVIORAL SYMPTOMS/MOOD NEUROLOGICAL BOWEL NUTRITION STATUS      Continent Diet (regular diet thin fluid consistency)  AMBULATORY STATUS COMMUNICATION OF NEEDS Skin   Limited Assist Verbally Normal                       Personal Care Assistance Level of Assistance   Bathing, Feeding, Dressing Bathing Assistance: Limited assistance Feeding assistance: Independent Dressing Assistance: Limited assistance     Functional Limitations Info  Sight, Hearing, Speech Sight Info: Adequate Hearing Info: Adequate Speech Info: Adequate    SPECIAL CARE FACTORS FREQUENCY  PT (By licensed PT), OT (By licensed OT)     PT Frequency: 5x OT Frequency: 5x            Contractures Contractures Info: Not present    Additional Factors Info  Code Status, Allergies Code Status Info: DNR Allergies Info: Hydrocodone, Indocin Indomethacin, Amoxicillin-pot Clavulanate, Erythromycin Ethylsuccinate           Current Medications (10/30/2016):  This is the current hospital active medication list Current Facility-Administered Medications  Medication Dose Route Frequency Provider Last Rate Last Dose  . 0.9 %  sodium chloride infusion   Intravenous Continuous Karmen Bongo, MD 125 mL/hr at 10/30/16 1128    . acetaminophen (TYLENOL) tablet 650 mg  650 mg Oral Q6H PRN Karmen Bongo, MD       Or  . acetaminophen (TYLENOL) suppository 650 mg  650 mg Rectal Q6H PRN Karmen Bongo, MD      . albuterol (PROVENTIL) (2.5 MG/3ML) 0.083% nebulizer solution 2.5 mg  2.5 mg Nebulization Q6H PRN Georgette Shell, MD      . amoxicillin (AMOXIL) capsule 500 mg  500 mg Oral Q12H Georgette Shell, MD   500 mg at 10/30/16 1158  . colchicine tablet 0.6 mg  0.6 mg Oral Daily Yates,  Anderson Malta, MD   0.6 mg at 10/30/16 1158  . donepezil (ARICEPT) tablet 10 mg  10 mg Oral Ivery Quale, MD   10 mg at 10/29/16 2218  . hydrocortisone sodium succinate (SOLU-CORTEF) 100 MG injection 100 mg  100 mg Intravenous Q12H Donne Hazel, MD   100 mg at 10/30/16 0552  . memantine (NAMENDA XR) 24 hr capsule 28 mg  28 mg Oral Daily Karmen Bongo, MD   28 mg at 10/30/16 1158  . ondansetron (ZOFRAN) tablet 4 mg  4 mg Oral Q6H PRN Karmen Bongo, MD       Or  . ondansetron [facility]) injection  4 mg  4 mg Intravenous Q6H PRN Karmen Bongo, MD      . QUEtiapine (SEROQUEL) tablet 12.5 mg  12.5 mg Oral Ivery Quale, MD   12.5 mg at 10/29/16 2219  . tamsulosin (FLOMAX) capsule 0.4 mg  0.4 mg Oral QPC supper Karmen Bongo, MD   0.4 mg at 10/29/16 1725     Discharge Medications: Please see discharge summary for a list of discharge medications.  Relevant Imaging Results:  Relevant Lab Results:   Additional Information SSN:  [id number]  Nila Nephew, LCSW

## 2016-10-30 NOTE — Progress Notes (Signed)
CSW following for disposition. SNF [facility] has been recommended by PT at [facility], however pt's family declines referrals per previous CSW interaction. CSW followed up today with both pt's daughter and son (POA)- both state "we want to have him transferred to [facility] with attending who states pt medically stable for DC. Family requested to speak with team re: transfer vs. Dc.  Updated attending.   Sharren Bridge, MSW, LCSW Clinical Social Work 10/30/2016 [phone]

## 2016-10-31 NOTE — Progress Notes (Signed)
PROGRESS NOTE    Ricky Boyd  FFM:[id number] DOB: [birth year] DOA: 10/24/2016 PCP: Lucille Passy, MD   Brief Narrative: 76 year old male with a history of prostate cancer recurrent urinary tract infection admitted with recurrent UTI and enterococcal bacteremia. Patient received continuous bladder irrigation. Patient has a chronic Foley catheter at home. I called her son to plan about discharge to a skilled nursing facility. Prior to this I tried to call his daughter who did not answer the phone. The family did not want him to go anywhere but back home. But patient is very deconditioned PT and OT recommend skilled nursing placement. Discussed with CSW and the son has agreed to send the patient to a skilled nursing facility in [place] or in [place]. He has picked up to places. Patient is currently on by mouth amoxicillin.   Assessment & Plan:   Principal Problem:   Sepsis secondary to UTI [facility]) Active Problems:   GOUT   Malignant neoplasm of prostate ([place])   Acute encephalopathy   Dementia   Urinary retention   Hematuria   Hyperglycemia   Thrombocytopenia (HCC)   Normocytic anemia   Enterococcal bacteremia   Bacteremia due to Enterobacter species    Sepsis secondary to urinary tract infection from chronic Foley. Status post IV antibiotics now on by mouth amoxicillin. Patient to follow up at [facility] for suprapubic catheter placement.. Plan is to discharge this patient to skilled nursing facility as soon as a bed is available.  DVT prophylaxis: scd Code Status:  Family Communication: Disposition Plan:    Consultants: ID  Procedures:   Antimicrobials:    Subjective: sleeping  Objective: Vitals:   10/29/16 2020 10/30/16 0551 10/30/16 2155 10/31/16 0457  BP: 140/72 (!) 143/70 132/86 126/66  Pulse: (!) 57 (!) 50 65 67  Resp: 16 15 18 18   Temp: 97.7 F (36.5 C) 97.6 F (36.4 C) 98.5 F (36.9 C) 98.3 F (36.8 C)  TempSrc: Oral Axillary Oral Oral  SpO2: 97%  95% 97% 98%  Weight:      Height:        Intake/Output Summary (Last 24 hours) at 10/31/16 1016 Last data filed at 10/31/16 0600  Gross per 24 hour  Intake             3703 ml  Output             3200 ml  Net              503 ml   Filed Weights   10/24/16 2144 10/27/16 0343  Weight: 81.6 kg (180 lb) 82.7 kg (182 lb 5.1 oz)    Examination:  General exam: Appears calm and comfortable  Respiratory system: Clear to auscultation. Respiratory effort normal. Cardiovascular system: S1 & S2 heard, RRR. No JVD, murmurs, rubs, gallops or clicks. No pedal edema. Gastrointestinal system: Abdomen is nondistended, soft and nontender. No organomegaly or masses felt. Normal bowel sounds heard. Central nervous system: Alert and oriented. No focal neurological deficits. Extremities: Symmetric 5 x 5 power. Skin: No rashes, lesions or ulcers Psychiatry: Judgement and insight appear normal. Mood & affect appropriate.     Data Reviewed: I have personally reviewed following labs and imaging studies  CBC:  Recent Labs Lab 10/24/16 2143 10/25/16 0350 10/26/16 0252 10/27/16 0254 10/28/16 0742  WBC 3.9* 12.4* 14.3* 12.9* 10.4  NEUTROABS 3.8  --   --   --   --   HGB 11.9* 8.9* 8.6* 8.7* 8.8*  HCT 35.2* 25.1*  25.1* 24.9* 26.2*  MCV 91.9 90.9 90.9 92.2 91.3  PLT 110* 90* 87* 79* 841*   Basic Metabolic Panel:  Recent Labs Lab 10/24/16 2143 10/25/16 0350 10/26/16 0252 10/27/16 0254 10/28/16 0742  NA 142 139 144 145 145  K 3.4* 3.0* 4.2 3.3* 3.7  CL 107 110 117* 119* 119*  CO2 24 20* 22 18* 23  GLUCOSE 142* 134* 139* 171* 115*  BUN 15 17 17 14 14   CREATININE 1.38* 1.37* 1.03 0.88 1.03  CALCIUM 8.8* 7.7* 7.9* 8.0* 8.2*  MG  --  1.1*  --   --   --    GFR: Estimated Creatinine Clearance: 59.1 mL/min (by C-G formula based on SCr of 1.03 mg/dL). Liver Function Tests: No results for input(s): AST, ALT, ALKPHOS, BILITOT, PROT, ALBUMIN in the last 168 hours. No results for input(s):  LIPASE, AMYLASE in the last 168 hours. No results for input(s): AMMONIA in the last 168 hours. Coagulation Profile:  Recent Labs Lab 10/25/16 0117  INR 1.28   Cardiac Enzymes: No results for input(s): CKTOTAL, CKMB, CKMBINDEX, TROPONINI in the last 168 hours. BNP (last 3 results) No results for input(s): PROBNP in the last 8760 hours. HbA1C: No results for input(s): HGBA1C in the last 72 hours. CBG: No results for input(s): GLUCAP in the last 168 hours. Lipid Profile: No results for input(s): CHOL, HDL, LDLCALC, TRIG, CHOLHDL, LDLDIRECT in the last 72 hours. Thyroid Function Tests: No results for input(s): TSH, T4TOTAL, FREET4, T3FREE, THYROIDAB in the last 72 hours. Anemia Panel: No results for input(s): VITAMINB12, FOLATE, FERRITIN, TIBC, IRON, RETICCTPCT in the last 72 hours. Sepsis Labs:  Recent Labs Lab 10/25/16 0117  10/25/16 1429 10/25/16 1839 10/26/16 0918 10/26/16 1201  PROCALCITON 51.32  --   --   --   --   --   LATICACIDVEN 5.4*  < > 3.0* 2.7* 2.0* 2.1*  < > = values in this interval not displayed.  Recent Results (from the past 240 hour(s))  Culture, blood (routine x 2)     Status: Abnormal   Collection Time: 10/24/16  9:59 PM  Result Value Ref Range Status   Specimen Description BLOOD LEFT ANTECUBITAL  Final   Special Requests   Final    BOTTLES DRAWN AEROBIC AND ANAEROBIC Blood Culture adequate volume   Culture  Setup Time   Final    GRAM NEGATIVE RODS IN BOTH AEROBIC AND ANAEROBIC BOTTLES GRAM POSITIVE COCCI IN PAIRS IN CHAINS ANAEROBIC BOTTLE ONLY CRITICAL RESULT CALLED TO, READ BACK BY AND VERIFIED WITH: Yevette Edwards [phone] MLM Performed at [facility], [place] [address] [place], [place] [address]    Culture (A)  Final    ENTEROBACTER CLOACAE CORRECTED RESULTS PREVIOUSLY REPORTED AS: KLEBSIELLA PNEUMONIAE CORRECTED RESULTS CALLED TO: J FRENS 10/27/16 @ [place]    Report Status 10/28/2016 FINAL  Final    Organism ID, Bacteria ENTEROBACTER CLOACAE  Final   Organism ID, Bacteria ENTEROCOCCUS FAECALIS  Final      Susceptibility   Enterobacter cloacae - MIC*    CEFAZOLIN >=64 RESISTANT Resistant     CEFEPIME <=1 SENSITIVE Sensitive     CEFTAZIDIME <=1 SENSITIVE Sensitive     CEFTRIAXONE 8 SENSITIVE Sensitive     CIPROFLOXACIN <=0.25 SENSITIVE Sensitive     GENTAMICIN <=1 SENSITIVE Sensitive     IMIPENEM <=0.25 SENSITIVE Sensitive     TRIMETH/SULFA <=20 SENSITIVE Sensitive     PIP/TAZO <=4 SENSITIVE Sensitive     *  ENTEROBACTER CLOACAE   Enterococcus faecalis - MIC*    AMPICILLIN <=2 SENSITIVE Sensitive     VANCOMYCIN 1 SENSITIVE Sensitive     GENTAMICIN SYNERGY SENSITIVE Sensitive     * ENTEROCOCCUS FAECALIS  Blood Culture ID Panel (Reflexed)     Status: Abnormal   Collection Time: 10/24/16  9:59 PM  Result Value Ref Range Status   Enterococcus species NOT DETECTED NOT DETECTED Final   Vancomycin resistance NOT DETECTED NOT DETECTED Final   Listeria monocytogenes NOT DETECTED NOT DETECTED Final   Staphylococcus species NOT DETECTED NOT DETECTED Final   Staphylococcus aureus NOT DETECTED NOT DETECTED Final   Streptococcus species NOT DETECTED NOT DETECTED Final   Streptococcus agalactiae NOT DETECTED NOT DETECTED Final   Streptococcus pneumoniae NOT DETECTED NOT DETECTED Final   Streptococcus pyogenes NOT DETECTED NOT DETECTED Final   Acinetobacter baumannii NOT DETECTED NOT DETECTED Final   Enterobacteriaceae species DETECTED (A) NOT DETECTED Final    Comment: Enterobacteriaceae represent a large family of gram-negative bacteria, not a single organism. CRITICAL RESULT CALLED TO, READ BACK BY AND VERIFIED WITH: PHARMD L POINDEXTER [phone] MLM    Enterobacter cloacae complex DETECTED (A) NOT DETECTED Final    Comment: CRITICAL RESULT CALLED TO, READ BACK BY AND VERIFIED WITH: PHARMD L POINDEXTER [phone] MLM    Escherichia coli NOT DETECTED NOT DETECTED Final    Klebsiella oxytoca NOT DETECTED NOT DETECTED Final   Klebsiella pneumoniae NOT DETECTED NOT DETECTED Final   Proteus species NOT DETECTED NOT DETECTED Final   Serratia marcescens NOT DETECTED NOT DETECTED Final   Carbapenem resistance NOT DETECTED NOT DETECTED Final   Haemophilus influenzae NOT DETECTED NOT DETECTED Final   Neisseria meningitidis NOT DETECTED NOT DETECTED Final   Pseudomonas aeruginosa NOT DETECTED NOT DETECTED Final   Candida albicans NOT DETECTED NOT DETECTED Final   Candida glabrata NOT DETECTED NOT DETECTED Final   Candida krusei NOT DETECTED NOT DETECTED Final   Candida parapsilosis NOT DETECTED NOT DETECTED Final   Candida tropicalis NOT DETECTED NOT DETECTED Final    Comment: Performed at [facility], [place] [address] [place], [place] [address]  Urine culture     Status: None   Collection Time: 10/24/16 10:04 PM  Result Value Ref Range Status   Specimen Description URINE, RANDOM  Final   Special Requests NONE  Final   Culture   Final    NO GROWTH Performed at [facility], [place] [address], [place], [place] [address]    Report Status 10/27/2016 FINAL  Final  Culture, blood (routine x 2)     Status: Abnormal   Collection Time: 10/24/16 10:06 PM  Result Value Ref Range Status   Specimen Description BLOOD RIGHT ANTECUBITAL  Final   Special Requests   Final    BOTTLES DRAWN AEROBIC AND ANAEROBIC Blood Culture adequate volume   Culture  Setup Time   Final    GRAM NEGATIVE RODS IN BOTH AEROBIC AND ANAEROBIC BOTTLES CRITICAL VALUE NOTED.  VALUE IS CONSISTENT WITH PREVIOUSLY REPORTED AND CALLED VALUE.    Culture (A)  Final    ENTEROBACTER CLOACAE SUSCEPTIBILITIES PERFORMED ON PREVIOUS CULTURE WITHIN THE LAST 5 DAYS. Performed at [facility], [place] [address], [place],  [address]    Report Status 10/28/2016 FINAL  Final  MRSA PCR Screening     Status: None   Collection Time: 10/25/16 12:47 AM  Result Value Ref Range Status   MRSA by PCR  NEGATIVE NEGATIVE Final    Comment:        The GeneXpert MRSA Assay (FDA approved for NASAL specimens only), is one component of a comprehensive MRSA colonization surveillance program. It is not intended to diagnose MRSA infection nor to guide or monitor treatment for MRSA infections.   Culture, blood (routine x 2)     Status: None (Preliminary result)   Collection Time: 10/28/16  2:53 PM  Result Value Ref Range Status   Specimen Description BLOOD RIGHT ANTECUBITAL  Final   Special Requests   Final    BOTTLES DRAWN AEROBIC ONLY Blood Culture adequate volume   Culture   Final    NO GROWTH 2 DAYS Performed at [facility], [address] [place], [place] [address]    Report Status PENDING  Incomplete  Culture, blood (routine x 2)     Status: None (Preliminary result)   Collection Time: 10/28/16  2:59 PM  Result Value Ref Range Status   Specimen Description BLOOD RIGHT ANTECUBITAL  Final   Special Requests   Final    BOTTLES DRAWN AEROBIC ONLY Blood Culture adequate volume   Culture   Final    NO GROWTH 2 DAYS Performed at [facility], [place] [address], [place], [place] [address]    Report Status PENDING  Incomplete         Radiology Studies: No results found.      Scheduled Meds: . amoxicillin  500 mg Oral Q12H  . colchicine  0.6 mg Oral Daily  . donepezil  10 mg Oral QHS  . hydrocortisone sod succinate (SOLU-CORTEF) inj  100 mg Intravenous Q12H  . memantine  28 mg Oral Daily  . QUEtiapine  12.5 mg Oral QHS  . tamsulosin  0.4 mg Oral QPC supper   Continuous Infusions: . sodium chloride 125 mL/hr at 10/31/16 0245     LOS: 7 days        Georgette Shell, MD [facility]  If 7PM-7AM, please contact night-coverage [email or web address] Password [email or web address] 10/31/2016, 10:16 AM

## 2016-10-31 NOTE — Progress Notes (Signed)
PROGRESS NOTE    Ricky Boyd  BMW:[id number] DOB: August 15, 1940 DOA: 10/24/2016 PCP: Lucille Passy, MD   Brief Narrative: 76 yo male admitted with CAUTI.ID notes reviwed.    Assessment & Plan:   Principal Problem:   Sepsis secondary to UTI [facility]) Active Problems:   GOUT   Malignant neoplasm of prostate ([place])   Acute encephalopathy   Dementia   Urinary retention   Hematuria   Hyperglycemia   Thrombocytopenia (HCC)   Normocytic anemia   Enterococcal bacteremia   Bacteremia due to Enterobacter species     Sepsis: Urinary source- Enterobacter Completed rocephin 9/8 2018  switch to Amoxicillin for another 5 days from 11/01/2016.    Prostate malignancy:  patient to follow-up at [facility].  Anemia of chronic disease: Stable Follow clinically        DVT prophylaxis: scd Code Status: dnr Family Communication: Disposition Plan:  Consultants: Infectious disease  Procedures:   Antimicrobialsrocephin   Subjective:no new complaints   Objective: Vitals:   10/29/16 2020 10/30/16 0551 10/30/16 2155 10/31/16 0457  BP: 140/72 (!) 143/70 132/86 126/66  Pulse: (!) 57 (!) 50 65 67  Resp: 16 15 18 18   Temp: 97.7 F (36.5 C) 97.6 F (36.4 C) 98.5 F (36.9 C) 98.3 F (36.8 C)  TempSrc: Oral Axillary Oral Oral  SpO2: 97% 95% 97% 98%  Weight:      Height:        Intake/Output Summary (Last 24 hours) at 10/31/16 1230 Last data filed at 10/31/16 0600  Gross per 24 hour  Intake             3703 ml  Output             3200 ml  Net              503 ml   Filed Weights   10/24/16 2144 10/27/16 0343  Weight: 81.6 kg (180 lb) 82.7 kg (182 lb 5.1 oz)    Examination:  General exam: No acute distress, comfortable  Respiratory system: Clear to auscultation. Respiratory effort normal. Cardiovascular system: S1 & S2 heard, RRR. No JVD, murmurs, rubs, gallops or clicks. No pedal edema. Gastrointestinal system: Abdomen is nondistended, soft and nontender.  No organomegaly or masses felt. Normal bowel sounds heard. Central nervous system: Alert and oriented. No focal neurological deficits. Extremities: Symmetric 5 x 5 power. Skin: No rashes, lesions or ulcers Psychiatry: Judgement and insight appear normal. Mood & affect appropriate.     Data Reviewed: I have personally reviewed following labs and imaging studies  CBC:  Recent Labs Lab 10/24/16 2143 10/25/16 0350 10/26/16 0252 10/27/16 0254 10/28/16 0742  WBC 3.9* 12.4* 14.3* 12.9* 10.4  NEUTROABS 3.8  --   --   --   --   HGB 11.9* 8.9* 8.6* 8.7* 8.8*  HCT 35.2* 25.1* 25.1* 24.9* 26.2*  MCV 91.9 90.9 90.9 92.2 91.3  PLT 110* 90* 87* 79* 272*   Basic Metabolic Panel:  Recent Labs Lab 10/24/16 2143 10/25/16 0350 10/26/16 0252 10/27/16 0254 10/28/16 0742  NA 142 139 144 145 145  K 3.4* 3.0* 4.2 3.3* 3.7  CL 107 110 117* 119* 119*  CO2 24 20* 22 18* 23  GLUCOSE 142* 134* 139* 171* 115*  BUN 15 17 17 14 14   CREATININE 1.38* 1.37* 1.03 0.88 1.03  CALCIUM 8.8* 7.7* 7.9* 8.0* 8.2*  MG  --  1.1*  --   --   --    GFR:  Estimated Creatinine Clearance: 59.1 mL/min (by C-G formula based on SCr of 1.03 mg/dL). Liver Function Tests: No results for input(s): AST, ALT, ALKPHOS, BILITOT, PROT, ALBUMIN in the last 168 hours. No results for input(s): LIPASE, AMYLASE in the last 168 hours. No results for input(s): AMMONIA in the last 168 hours. Coagulation Profile:  Recent Labs Lab 10/25/16 0117  INR 1.28   Cardiac Enzymes: No results for input(s): CKTOTAL, CKMB, CKMBINDEX, TROPONINI in the last 168 hours. BNP (last 3 results) No results for input(s): PROBNP in the last 8760 hours. HbA1C: No results for input(s): HGBA1C in the last 72 hours. CBG: No results for input(s): GLUCAP in the last 168 hours. Lipid Profile: No results for input(s): CHOL, HDL, LDLCALC, TRIG, CHOLHDL, LDLDIRECT in the last 72 hours. Thyroid Function Tests: No results for input(s): TSH, T4TOTAL,  FREET4, T3FREE, THYROIDAB in the last 72 hours. Anemia Panel: No results for input(s): VITAMINB12, FOLATE, FERRITIN, TIBC, IRON, RETICCTPCT in the last 72 hours. Sepsis Labs:  Recent Labs Lab 10/25/16 0117  10/25/16 1429 10/25/16 1839 10/26/16 0918 10/26/16 1201  PROCALCITON 51.32  --   --   --   --   --   LATICACIDVEN 5.4*  < > 3.0* 2.7* 2.0* 2.1*  < > = values in this interval not displayed.  Recent Results (from the past 240 hour(s))  Culture, blood (routine x 2)     Status: Abnormal   Collection Time: 10/24/16  9:59 PM  Result Value Ref Range Status   Specimen Description BLOOD LEFT ANTECUBITAL  Final   Special Requests   Final    BOTTLES DRAWN AEROBIC AND ANAEROBIC Blood Culture adequate volume   Culture  Setup Time   Final    GRAM NEGATIVE RODS IN BOTH AEROBIC AND ANAEROBIC BOTTLES GRAM POSITIVE COCCI IN PAIRS IN CHAINS ANAEROBIC BOTTLE ONLY CRITICAL RESULT CALLED TO, READ BACK BY AND VERIFIED WITH: Yevette Edwards [phone] MLM Performed at [facility], [place] [address] [place], [place] [address]    Culture (A)  Final    ENTEROBACTER CLOACAE CORRECTED RESULTS PREVIOUSLY REPORTED AS: KLEBSIELLA PNEUMONIAE CORRECTED RESULTS CALLED TO: J FRENS 10/27/16 @ 1311 M VESTAL ENTEROCOCCUS FAECALIS    Report Status 10/28/2016 FINAL  Final   Organism ID, Bacteria ENTEROBACTER CLOACAE  Final   Organism ID, Bacteria ENTEROCOCCUS FAECALIS  Final      Susceptibility   Enterobacter cloacae - MIC*    CEFAZOLIN >=64 RESISTANT Resistant     CEFEPIME <=1 SENSITIVE Sensitive     CEFTAZIDIME <=1 SENSITIVE Sensitive     CEFTRIAXONE 8 SENSITIVE Sensitive     CIPROFLOXACIN <=0.25 SENSITIVE Sensitive     GENTAMICIN <=1 SENSITIVE Sensitive     IMIPENEM <=0.25 SENSITIVE Sensitive     TRIMETH/SULFA <=20 SENSITIVE Sensitive     PIP/TAZO <=4 SENSITIVE Sensitive     * ENTEROBACTER CLOACAE   Enterococcus faecalis - MIC*    AMPICILLIN <=2 SENSITIVE Sensitive     VANCOMYCIN 1  SENSITIVE Sensitive     GENTAMICIN SYNERGY SENSITIVE Sensitive     * ENTEROCOCCUS FAECALIS  Blood Culture ID Panel (Reflexed)     Status: Abnormal   Collection Time: 10/24/16  9:59 PM  Result Value Ref Range Status   Enterococcus species NOT DETECTED NOT DETECTED Final   Vancomycin resistance NOT DETECTED NOT DETECTED Final   Listeria monocytogenes NOT DETECTED NOT DETECTED Final   Staphylococcus species NOT DETECTED NOT DETECTED Final   Staphylococcus aureus NOT DETECTED  NOT DETECTED Final   Streptococcus species NOT DETECTED NOT DETECTED Final   Streptococcus agalactiae NOT DETECTED NOT DETECTED Final   Streptococcus pneumoniae NOT DETECTED NOT DETECTED Final   Streptococcus pyogenes NOT DETECTED NOT DETECTED Final   Acinetobacter baumannii NOT DETECTED NOT DETECTED Final   Enterobacteriaceae species DETECTED (A) NOT DETECTED Final    Comment: Enterobacteriaceae represent a large family of gram-negative bacteria, not a single organism. CRITICAL RESULT CALLED TO, READ BACK BY AND VERIFIED WITH: PHARMD L POINDEXTER [phone] MLM    Enterobacter cloacae complex DETECTED (A) NOT DETECTED Final    Comment: CRITICAL RESULT CALLED TO, READ BACK BY AND VERIFIED WITH: PHARMD L POINDEXTER [id number] 2001 MLM    Escherichia coli NOT DETECTED NOT DETECTED Final   Klebsiella oxytoca NOT DETECTED NOT DETECTED Final   Klebsiella pneumoniae NOT DETECTED NOT DETECTED Final   Proteus species NOT DETECTED NOT DETECTED Final   Serratia marcescens NOT DETECTED NOT DETECTED Final   Carbapenem resistance NOT DETECTED NOT DETECTED Final   Haemophilus influenzae NOT DETECTED NOT DETECTED Final   Neisseria meningitidis NOT DETECTED NOT DETECTED Final   Pseudomonas aeruginosa NOT DETECTED NOT DETECTED Final   Candida albicans NOT DETECTED NOT DETECTED Final   Candida glabrata NOT DETECTED NOT DETECTED Final   Candida krusei NOT DETECTED NOT DETECTED Final   Candida parapsilosis NOT DETECTED NOT DETECTED  Final   Candida tropicalis NOT DETECTED NOT DETECTED Final    Comment: Performed at [facility], [place] [address] [place], [place] [address]  Urine culture     Status: None   Collection Time: 10/24/16 10:04 PM  Result Value Ref Range Status   Specimen Description URINE, RANDOM  Final   Special Requests NONE  Final   Culture   Final    NO GROWTH Performed at [facility], [place] [address], [place], [place] [address]    Report Status 10/27/2016 FINAL  Final  Culture, blood (routine x 2)     Status: Abnormal   Collection Time: 10/24/16 10:06 PM  Result Value Ref Range Status   Specimen Description BLOOD RIGHT ANTECUBITAL  Final   Special Requests   Final    BOTTLES DRAWN AEROBIC AND ANAEROBIC Blood Culture adequate volume   Culture  Setup Time   Final    GRAM NEGATIVE RODS IN BOTH AEROBIC AND ANAEROBIC BOTTLES CRITICAL VALUE NOTED.  VALUE IS CONSISTENT WITH PREVIOUSLY REPORTED AND CALLED VALUE.    Culture (A)  Final    ENTEROBACTER CLOACAE SUSCEPTIBILITIES PERFORMED ON PREVIOUS CULTURE WITHIN THE LAST 5 DAYS. Performed at [facility], [place] [address] [place], [place] [address]    Report Status 10/28/2016 FINAL  Final  MRSA PCR Screening     Status: None   Collection Time: 10/25/16 12:47 AM  Result Value Ref Range Status   MRSA by PCR NEGATIVE NEGATIVE Final    Comment:        The GeneXpert MRSA Assay (FDA approved for NASAL specimens only), is one component of a comprehensive MRSA colonization surveillance program. It is not intended to diagnose MRSA infection nor to guide or monitor treatment for MRSA infections.   Culture, blood (routine x 2)     Status: None (Preliminary result)   Collection Time: 10/28/16  2:53 PM  Result Value Ref Range Status   Specimen Description BLOOD RIGHT ANTECUBITAL  Final   Special Requests   Final    BOTTLES DRAWN AEROBIC ONLY Blood Culture adequate  volume   Culture   Final    NO GROWTH 3 DAYS Performed at [facility], [place] [address] [place], [place] [address]    Report Status PENDING  Incomplete  Culture, blood (routine x 2)     Status: None (Preliminary result)   Collection Time: 10/28/16  2:59 PM  Result Value Ref Range Status   Specimen Description BLOOD RIGHT ANTECUBITAL  Final   Special Requests   Final    BOTTLES DRAWN AEROBIC ONLY Blood Culture adequate volume   Culture   Final    NO GROWTH 3 DAYS Performed at [facility], [place] [address] [place], [place] [address]    Report Status PENDING  Incomplete         Radiology Studies: No results found.      Scheduled Meds: . amoxicillin  500 mg Oral Q12H  . colchicine  0.6 mg Oral Daily  . donepezil  10 mg Oral QHS  . hydrocortisone sod succinate (SOLU-CORTEF) inj  100 mg Intravenous Q12H  . memantine  28 mg Oral Daily  . QUEtiapine  12.5 mg Oral QHS  . tamsulosin  0.4 mg Oral QPC supper   Continuous Infusions: . sodium chloride 125 mL/hr at 10/31/16 0245     LOS: 7 days     OSEI-BONSU,Citlalli Weikel, MD [phone] [facility]   If 7PM-7AM, please contact night-coverage [email or web address] Password [email or web address] 10/31/2016, 12:30 PM

## 2016-11-01 NOTE — Progress Notes (Signed)
PROGRESS NOTE    Ricky Boyd  MVE:[id number] DOB: 1940-10-15 DOA: 10/24/2016 PCP: Lucille Passy, MD   Brief Narrative:  Ricky Boyd is a 76 y.o. male with medical history significant for but not limited to prostate cancer, mild dementia, and gout presenting with gross hematuria associated with fever, tachycardia, hypotension and elevated lactic acid level.  Patient admitted for sepsis of urinary source.   Assessment & Plan:   Principal Problem:   Sepsis secondary to UTI [facility]) Active Problems:   GOUT   Malignant neoplasm of prostate ([place])   Acute encephalopathy   Dementia   Urinary retention   Hematuria   Hyperglycemia   Thrombocytopenia (HCC)   Normocytic anemia   Enterococcal bacteremia   Bacteremia due to Enterobacter species     Sepsis: Urinary source- Enterobacter Completed rocephin 9/8 2018  switched to Amoxicillin for another 5 days from 11/01/2016.    Prostate malignancy:  patient to follow-[facility] at [facility] urology.  Anemia of chronic disease: Stable Follow clinically        DVT prophylaxis: scd Code Status: dnr Family Communication: Disposition Plan: Awaiting SNIF placement  Consultants: Infectious disease  Procedures:   Antimicrobialsrocephin   Subjective:no new complaints   Objective: Vitals:   10/31/16 0457 10/31/16 1641 10/31/16 2117 11/01/16 0452  BP: 126/66 (!) 142/74 138/71 140/67  Pulse: 67 60 (!) 55 (!) 55  Resp: 18 19 18 18   Temp: 98.3 F (36.8 C) 97.8 F (36.6 C) 98.6 F (37 C) (!) 97.5 F (36.4 C)  TempSrc: Oral Oral Oral Oral  SpO2: 98% 94% 95% 95%  Weight:      Height:        Intake/Output Summary (Last 24 hours) at 11/01/16 1358 Last data filed at 11/01/16 0458  Gross per 24 hour  Intake             1240 ml  Output             2550 ml  Net            -1310 ml   Filed Weights   10/24/16 2144 10/27/16 0343  Weight: 81.6 kg (180 lb) 82.7 kg (182 lb 5.1 oz)    Examination:  General exam: No acute  distress, comfortable  Respiratory system: Clear to auscultation. Respiratory effort normal. Cardiovascular system: S1 & S2 heard, RRR. No JVD, murmurs, rubs, gallops or clicks. No pedal edema. Gastrointestinal system: Abdomen is nondistended, soft and nontender. No organomegaly or masses felt. Normal bowel sounds heard. Central nervous system: Alert and oriented. No focal neurological deficits. Extremities: Symmetric 5 x 5 power. Skin: No rashes, lesions or ulcers Psychiatry: Judgement and insight appear normal. Mood & affect appropriate.     Data Reviewed: I have personally reviewed following labs and imaging studies  CBC:  Recent Labs Lab 10/26/16 0252 10/27/16 0254 10/28/16 0742  WBC 14.3* 12.9* 10.4  HGB 8.6* 8.7* 8.8*  HCT 25.1* 24.9* 26.2*  MCV 90.9 92.2 91.3  PLT 87* 79* 283*   Basic Metabolic Panel:  Recent Labs Lab 10/26/16 0252 10/27/16 0254 10/28/16 0742  NA 144 145 145  K 4.2 3.3* 3.7  CL 117* 119* 119*  CO2 22 18* 23  GLUCOSE 139* 171* 115*  BUN 17 14 14   CREATININE 1.03 0.88 1.03  CALCIUM 7.9* 8.0* 8.2*   GFR: Estimated Creatinine Clearance: 59.1 mL/min (by C-G formula based on SCr of 1.03 mg/dL). Liver Function Tests: No results for input(s): AST, ALT, ALKPHOS, BILITOT, PROT,  ALBUMIN in the last 168 hours. No results for input(s): LIPASE, AMYLASE in the last 168 hours. No results for input(s): AMMONIA in the last 168 hours. Coagulation Profile: No results for input(s): INR, PROTIME in the last 168 hours. Cardiac Enzymes: No results for input(s): CKTOTAL, CKMB, CKMBINDEX, TROPONINI in the last 168 hours. BNP (last 3 results) No results for input(s): PROBNP in the last 8760 hours. HbA1C: No results for input(s): HGBA1C in the last 72 hours. CBG: No results for input(s): GLUCAP in the last 168 hours. Lipid Profile: No results for input(s): CHOL, HDL, LDLCALC, TRIG, CHOLHDL, LDLDIRECT in the last 72 hours. Thyroid Function Tests: No results for  input(s): TSH, T4TOTAL, FREET4, T3FREE, THYROIDAB in the last 72 hours. Anemia Panel: No results for input(s): VITAMINB12, FOLATE, FERRITIN, TIBC, IRON, RETICCTPCT in the last 72 hours. Sepsis Labs:  Recent Labs Lab 10/25/16 1429 10/25/16 1839 10/26/16 0918 10/26/16 1201  LATICACIDVEN 3.0* 2.7* 2.0* 2.1*    Recent Results (from the past 240 hour(s))  Culture, blood (routine x 2)     Status: Abnormal   Collection Time: 10/24/16  9:59 PM  Result Value Ref Range Status   Specimen Description BLOOD LEFT ANTECUBITAL  Final   Special Requests   Final    BOTTLES DRAWN AEROBIC AND ANAEROBIC Blood Culture adequate volume   Culture  Setup Time   Final    GRAM NEGATIVE RODS IN BOTH AEROBIC AND ANAEROBIC BOTTLES GRAM POSITIVE COCCI IN PAIRS IN CHAINS ANAEROBIC BOTTLE ONLY CRITICAL RESULT CALLED TO, READ BACK BY AND VERIFIED WITH: Yevette Edwards [phone] MLM Performed at [facility], [place] [address] [place], [place] [address]    Culture (A)  Final    ENTEROBACTER CLOACAE CORRECTED RESULTS PREVIOUSLY REPORTED AS: KLEBSIELLA PNEUMONIAE CORRECTED RESULTS CALLED TO: J FRENS 10/27/16 @ 1311 M VESTAL ENTEROCOCCUS FAECALIS    Report Status 10/28/2016 FINAL  Final   Organism ID, Bacteria ENTEROBACTER CLOACAE  Final   Organism ID, Bacteria ENTEROCOCCUS FAECALIS  Final      Susceptibility   Enterobacter cloacae - MIC*    CEFAZOLIN >=64 RESISTANT Resistant     CEFEPIME <=1 SENSITIVE Sensitive     CEFTAZIDIME <=1 SENSITIVE Sensitive     CEFTRIAXONE 8 SENSITIVE Sensitive     CIPROFLOXACIN <=0.25 SENSITIVE Sensitive     GENTAMICIN <=1 SENSITIVE Sensitive     IMIPENEM <=0.25 SENSITIVE Sensitive     TRIMETH/SULFA <=20 SENSITIVE Sensitive     PIP/TAZO <=4 SENSITIVE Sensitive     * ENTEROBACTER CLOACAE   Enterococcus faecalis - MIC*    AMPICILLIN <=2 SENSITIVE Sensitive     VANCOMYCIN 1 SENSITIVE Sensitive     GENTAMICIN SYNERGY SENSITIVE Sensitive     * ENTEROCOCCUS FAECALIS    Blood Culture ID Panel (Reflexed)     Status: Abnormal   Collection Time: 10/24/16  9:59 PM  Result Value Ref Range Status   Enterococcus species NOT DETECTED NOT DETECTED Final   Vancomycin resistance NOT DETECTED NOT DETECTED Final   Listeria monocytogenes NOT DETECTED NOT DETECTED Final   Staphylococcus species NOT DETECTED NOT DETECTED Final   Staphylococcus aureus NOT DETECTED NOT DETECTED Final   Streptococcus species NOT DETECTED NOT DETECTED Final   Streptococcus agalactiae NOT DETECTED NOT DETECTED Final   Streptococcus pneumoniae NOT DETECTED NOT DETECTED Final   Streptococcus pyogenes NOT DETECTED NOT DETECTED Final   Acinetobacter baumannii NOT DETECTED NOT DETECTED Final   Enterobacteriaceae species DETECTED (A) NOT DETECTED Final  Comment: Enterobacteriaceae represent a large family of gram-negative bacteria, not a single organism. CRITICAL RESULT CALLED TO, READ BACK BY AND VERIFIED WITH: PHARMD L POINDEXTER [phone] MLM    Enterobacter cloacae complex DETECTED (A) NOT DETECTED Final    Comment: CRITICAL RESULT CALLED TO, READ BACK BY AND VERIFIED WITH: PHARMD L POINDEXTER [phone] MLM    Escherichia coli NOT DETECTED NOT DETECTED Final   Klebsiella oxytoca NOT DETECTED NOT DETECTED Final   Klebsiella pneumoniae NOT DETECTED NOT DETECTED Final   Proteus species NOT DETECTED NOT DETECTED Final   Serratia marcescens NOT DETECTED NOT DETECTED Final   Carbapenem resistance NOT DETECTED NOT DETECTED Final   Haemophilus influenzae NOT DETECTED NOT DETECTED Final   Neisseria meningitidis NOT DETECTED NOT DETECTED Final   Pseudomonas aeruginosa NOT DETECTED NOT DETECTED Final   Candida albicans NOT DETECTED NOT DETECTED Final   Candida glabrata NOT DETECTED NOT DETECTED Final   Candida krusei NOT DETECTED NOT DETECTED Final   Candida parapsilosis NOT DETECTED NOT DETECTED Final   Candida tropicalis NOT DETECTED NOT DETECTED Final    Comment: Performed at [facility], [place] [address] [place], [place] [address]  Urine culture     Status: None   Collection Time: 10/24/16 10:04 PM  Result Value Ref Range Status   Specimen Description URINE, RANDOM  Final   Special Requests NONE  Final   Culture   Final    NO GROWTH Performed at [facility], [place] [address], [place], [place] [address]    Report Status 10/27/2016 FINAL  Final  Culture, blood (routine x 2)     Status: Abnormal   Collection Time: 10/24/16 10:06 PM  Result Value Ref Range Status   Specimen Description BLOOD RIGHT ANTECUBITAL  Final   Special Requests   Final    BOTTLES DRAWN AEROBIC AND ANAEROBIC Blood Culture adequate volume   Culture  Setup Time   Final    GRAM NEGATIVE RODS IN BOTH AEROBIC AND ANAEROBIC BOTTLES CRITICAL VALUE NOTED.  VALUE IS CONSISTENT WITH PREVIOUSLY REPORTED AND CALLED VALUE.    Culture (A)  Final    ENTEROBACTER CLOACAE SUSCEPTIBILITIES PERFORMED ON PREVIOUS CULTURE WITHIN THE LAST 5 DAYS. Performed at [facility], [place] [address], [place], [place] [address]    Report Status 10/28/2016 FINAL  Final  MRSA PCR Screening     Status: None   Collection Time: 10/25/16 12:47 AM  Result Value Ref Range Status   MRSA by PCR NEGATIVE NEGATIVE Final    Comment:        The GeneXpert MRSA Assay (FDA approved for NASAL specimens only), is one component of a comprehensive MRSA colonization surveillance program. It is not intended to diagnose MRSA infection nor to guide or monitor treatment for MRSA infections.   Culture, blood (routine x 2)     Status: None (Preliminary result)   Collection Time: 10/28/16  2:53 PM  Result Value Ref Range Status   Specimen Description BLOOD RIGHT ANTECUBITAL  Final   Special Requests   Final    BOTTLES DRAWN AEROBIC ONLY Blood Culture adequate volume   Culture   Final    NO GROWTH 4 DAYS Performed at [facility], [address], [place], [place] [address]    Report Status PENDING  Incomplete   Culture, blood (routine x 2)     Status: None (Preliminary result)   Collection Time: 10/28/16  2:59 PM  Result  Value Ref Range Status   Specimen Description BLOOD RIGHT ANTECUBITAL  Final   Special Requests   Final    BOTTLES DRAWN AEROBIC ONLY Blood Culture adequate volume   Culture   Final    NO GROWTH 4 DAYS Performed at [facility], [address], [place], [place] [address]    Report Status PENDING  Incomplete         Radiology Studies: No results found.      Scheduled Meds: . amoxicillin  500 mg Oral Q12H  . colchicine  0.6 mg Oral Daily  . donepezil  10 mg Oral QHS  . hydrocortisone sod succinate (SOLU-CORTEF) inj  100 mg Intravenous Q12H  . memantine  28 mg Oral Daily  . QUEtiapine  12.5 mg Oral QHS  . tamsulosin  0.4 mg Oral QPC supper   Continuous Infusions: . sodium chloride 10 mL/hr at 10/31/16 1713     LOS: 8 days     OSEI-BONSU,Maryfer Tauzin, MD [phone] [facility]   If 7PM-7AM, please contact night-coverage [email or web address] Password [email or web address] 11/01/2016, 1:58 PM

## 2016-11-02 DIAGNOSIS — R7881 Bacteremia: Secondary | ICD-10-CM

## 2016-11-02 DIAGNOSIS — B9689 Other specified bacterial agents as the cause of diseases classified elsewhere: Secondary | ICD-10-CM

## 2016-11-02 LAB — CULTURE, BLOOD (ROUTINE X 2)
CULTURE: NO GROWTH
CULTURE: NO GROWTH
SPECIAL REQUESTS: ADEQUATE
Special Requests: ADEQUATE

## 2016-11-02 MED ORDER — AMOXICILLIN 500 MG PO CAPS: 500.0000 mg | ORAL_CAPSULE | Freq: Two times a day (BID) | ORAL | 0 refills | Status: AC

## 2016-11-02 MED ORDER — QUETIAPINE FUMARATE 25 MG PO TABS: 12.5000 mg | ORAL_TABLET | Freq: Every day | ORAL | 0 refills | Status: DC

## 2016-11-02 NOTE — Clinical Social Work Placement (Signed)
Patient received and accepted bed offer at [facility]. [facility] contacted, patient's family notified. Patient's RN can call report to [phone] room 401B, packet complete.CSW signing off, no other needs identified at this time.   CLINICAL SOCIAL WORK PLACEMENT  NOTE  Date:  11/02/2016  Patient Details  Name: Ricky Boyd MRN: [id number] Date of Birth: Jun 16, 1940  Clinical Social Work is seeking post-discharge placement for this patient at the [place] level of care (*CSW will initial, date and re-position this form in  chart as items are completed):  Yes   Patient/family provided with [facility]'s list of facilities offering this level of care within the geographic area requested by the patient (or if unable, by the patient's family).  Yes   Patient/family informed of their freedom to choose among providers that offer the needed level of care, that participate in Medicare, Medicaid or managed care program needed by the patient, have an available bed and are willing to accept the patient.  Yes   Patient/family informed of [facility]'s ownership interest in [facility] and [facility], as well as of the fact that they are under no obligation to receive care at these facilities.  PASRR submitted to EDS on       PASRR number received on       Existing PASRR number confirmed on       FL2 transmitted to all facilities in geographic area requested by pt/family on 10/30/16     FL2 transmitted to all facilities within larger geographic area on       Patient informed that his/her managed care company has contracts with or will negotiate with certain facilities, including the following:        Yes   Patient/family informed of bed offers received.  Patient chooses bed at [facility] [place]     Physician recommends and patient chooses bed at      Patient to be transferred to [facility] on 11/02/16.  Patient to be  transferred to facility by [facility]     Patient family notified on 11/02/16 of transfer.  Name of family member notified:  Hollice Espy, Amada Jupiter II     PHYSICIAN       Additional Comment:    _______________________________________________ Burnis Medin, LCSW 11/02/2016, 2:08 PM

## 2016-11-02 NOTE — Discharge Summary (Signed)
Physician Discharge Summary  Ricky Boyd RFF:[id number] DOB: [birth year] DOA: 10/24/2016  PCP: Lucille Passy, MD  Admit date: 10/24/2016 Discharge date: 11/02/2016  Admitted From: Home Disposition:  SNF  Recommendations for Outpatient Follow-up:  1. Follow up with PCP in 1 week 2. Follow up with [facility] urology in 1-2 weeks 3. Please obtain BMP/CBC in 1 week  4. Please follow up on the following pending results: final blood culture result  Discharge Condition: Stable CODE STATUS: DNR  Diet recommendation: Regular   Brief/Interim Summary: From H&P by Dr. Lorin Mercy: Ricky Boyd is a 76 y.o. male with medical history significant of prostate cancer, mild dementia, and gout presenting with gross hematuria.  About 4:30, his caregiver arrived and he was not wearing his catheter bag.  He had been wearing it at 4:15 when his son left.  There was blood around the commode where he had removed it.  She put the catheter bag back on and it filled with blood.  He seemed to feel well prior to that today.  Seemed to be finally getting over the UTI from his prior admission (7/25-31) and the last few days were his best days yet.  +dysuria but he has had ongoing discomfort.  Stopped radiation therapy due to urethral edema.  He was fine all day today.  No blood in his urine until this evening.  No fevers at home.  More confused than usual while in the ER only.  Prostate cancer diagnosed 4 years ago.  No treatment at that time.  About 6 months ago, there was another biopsy with worsening prostate cancer.  He was given Lupron and then started radioation therapy.  After week 7 he couldn't urinate at all, diarrhea was unmanagable. They stopped therapy at that time.  3 days later he had 1.5L retention and he has had catheter since.  3 days later he was hospitalized for a UTI.  They are not planning further prostate cancer treatment.  ED Course: Hematuria, fevers, chills.  Lacate 6.  Started empiric abx with Levaquin and Aztreonam  for hospital-associated UTI with PCN allergy.  BP improved with IVF.  Code sepsis.  Interim: He was treated for severe sepsis with septic shock secondary to presumed urinary tract infection with chronic Foley catheter. Blood and urine culture was positive for Enterobacter and enterococcus. He was on cefepime, transition to Rocephin. Infectious disease was consulted, antibiotics changed to Zosyn. Infectious disease has recommended to finish treatment with ceftriaxone, as well as treat with oral amoxicillin for 5 more days. Last day of amoxicillin is 9/11. On day of discharge, patient was feeling well without any complaints. He'll be discharged to skilled nursing facility. Family at bedside agreement with plan.  Discharge Diagnoses:  Principal Problem:   Sepsis secondary to UTI [facility]) Active Problems:   GOUT   Malignant neoplasm of prostate ([place])   Acute encephalopathy   Dementia   Urinary retention   Hematuria   Hyperglycemia   Thrombocytopenia (HCC)   Normocytic anemia   Enterococcal bacteremia   Bacteremia due to Enterobacter species  Severe sepsis with septic shock secondary to Enterobacter bacteremia, due to CAUTI POA  -Appreciate ID -Vital sign stable, BP 136/74 this morning  -Finished ceftriaxone treatment, last day of amoxicillin is 9/11  Dementia -Continued Aricept/Namenda and Seroquel -Currently stable  Urinary retention -Stable with indwelling foley cath -Pt is followed by Urology at [place] discussed case with on-call Urologist who recommends continuing indwelling foley cath and to follow up with  patient's primary Urologist following discharge.  Prostate cancer  -No longer undergoing therapy or interested in pursuing in the future -Appears stable at this time  Thrombocytopenia -No signs of active bleed. Stable and improved.    Discharge Instructions  Discharge Instructions    Call MD for:  difficulty breathing, headache or visual disturbances     Complete by:  As directed    Call MD for:  extreme fatigue    Complete by:  As directed    Call MD for:  hives    Complete by:  As directed    Call MD for:  persistant dizziness or light-headedness    Complete by:  As directed    Call MD for:  persistant nausea and vomiting    Complete by:  As directed    Call MD for:  severe uncontrolled pain    Complete by:  As directed    Call MD for:  temperature >100.4    Complete by:  As directed    Diet general    Complete by:  As directed    Discharge instructions    Complete by:  As directed    You were cared for by a hospitalist during your hospital stay. If you have any questions about your discharge medications or the care you received while you were in the hospital after you are discharged, you can call the unit and asked to speak with the hospitalist on call if the hospitalist that took care of you is not available. Once you are discharged, your primary care physician will handle any further medical issues. Please note that NO REFILLS for any discharge medications will be authorized once you are discharged, as it is imperative that you return to your primary care physician (or establish a relationship with a primary care physician if you do not have one) for your aftercare needs so that they can reassess your need for medications and monitor your lab values.   Increase activity slowly    Complete by:  As directed      Allergies as of 11/02/2016      Reactions   Hydrocodone Other (See Comments)   Reaction:  Hallucinations   Indocin [indomethacin] Other (See Comments)   Reaction:  Confusion    Amoxicillin-pot Clavulanate Other (See Comments)   Reaction:  Nightmares Has patient had a PCN reaction causing immediate rash, facial/tongue/throat swelling, SOB or lightheadedness with hypotension: No Has patient had a PCN reaction causing severe rash involving mucus membranes or skin necrosis: No Has patient had a PCN reaction that required  hospitalization: No Has patient had a PCN reaction occurring within the last 10 years: Yes If all of the above answers are "NO", then may proceed with Cephalosporin use.   Erythromycin Ethylsuccinate Other (See Comments)   Reaction:  Eye irritation       Medication List    TAKE these medications   acetaminophen 500 MG tablet Commonly known as:  TYLENOL Take 1,000 mg by mouth every 6 (six) hours as needed for mild pain.   albuterol (2.5 MG/3ML) 0.083% nebulizer solution Commonly known as:  PROVENTIL Take 3 mLs (2.5 mg total) by nebulization every 6 (six) hours as needed for wheezing or shortness of breath.   amoxicillin 500 MG capsule Commonly known as:  AMOXIL Take 1 capsule (500 mg total) by mouth 2 (two) times daily.   colchicine 0.6 MG tablet Take 1 tablet (0.6 mg total) by mouth daily. When having a gout attack, take 2  tablets in day 1 then daily for 6 more days What changed:  Another medication with the same name was added. Make sure you understand how and when to take each.   colchicine 0.6 MG tablet Take 1 tablet (0.6 mg total) by mouth daily. What changed:  You were already taking a medication with the same name, and this prescription was added. Make sure you understand how and when to take each.   Memantine HCl-Donepezil HCl 28-10 MG Cp24 Commonly known as:  NAMZARIC Take 28 mg by mouth daily.   QUEtiapine 25 MG tablet Commonly known as:  SEROQUEL Take 0.5 tablets (12.5 mg total) by mouth at bedtime.   tamsulosin 0.4 MG Caps capsule Commonly known as:  FLOMAX Take 1 capsule (0.4 mg total) by mouth daily after supper.            Discharge Care Instructions        Start     Ordered   11/02/16 0000  amoxicillin (AMOXIL) 500 MG capsule  2 times daily    Comments:  Finish hospital antibiotic   11/02/16 1210   11/02/16 0000  QUEtiapine (SEROQUEL) 25 MG tablet  Daily at bedtime     11/02/16 1210   11/02/16 0000  Increase activity slowly     11/02/16 1210    11/02/16 0000  Discharge instructions    Comments:  You were cared for by a hospitalist during your hospital stay. If you have any questions about your discharge medications or the care you received while you were in the hospital after you are discharged, you can call the unit and asked to speak with the hospitalist on call if the hospitalist that took care of you is not available. Once you are discharged, your primary care physician will handle any further medical issues. Please note that NO REFILLS for any discharge medications will be authorized once you are discharged, as it is imperative that you return to your primary care physician (or establish a relationship with a primary care physician if you do not have one) for your aftercare needs so that they can reassess your need for medications and monitor your lab values.   11/02/16 1210   11/02/16 0000  Diet general     11/02/16 1210   11/02/16 0000  Call MD for:  temperature >100.4     11/02/16 1210   11/02/16 0000  Call MD for:  persistant nausea and vomiting     11/02/16 1210   11/02/16 0000  Call MD for:  severe uncontrolled pain     11/02/16 1210   11/02/16 0000  Call MD for:  extreme fatigue     11/02/16 1210   11/02/16 0000  Call MD for:  persistant dizziness or light-headedness     11/02/16 1210   11/02/16 0000  Call MD for:  hives     11/02/16 1210   11/02/16 0000  Call MD for:  difficulty breathing, headache or visual disturbances     11/02/16 1210   10/31/16 0000  colchicine 0.6 MG tablet  Daily     10/30/16 1605   10/30/16 0000  albuterol (PROVENTIL) (2.5 MG/3ML) 0.083% nebulizer solution  Every 6 hours PRN     10/30/16 1605     Follow-up Information    Lucille Passy, MD. Schedule an appointment as soon as possible for a visit in 1 week(s).   Specialty:  Family Medicine Contact information: [place] [address] [phone]  Allergies  Allergen Reactions  . Hydrocodone Other (See  Comments)    Reaction:  Hallucinations  . Indocin [Indomethacin] Other (See Comments)    Reaction:  Confusion   . Amoxicillin-Pot Clavulanate Other (See Comments)    Reaction:  Nightmares Has patient had a PCN reaction causing immediate rash, facial/tongue/throat swelling, SOB or lightheadedness with hypotension: No Has patient had a PCN reaction causing severe rash involving mucus membranes or skin necrosis: No Has patient had a PCN reaction that required hospitalization: No Has patient had a PCN reaction occurring within the last 10 years: Yes If all of the above answers are "NO", then may proceed with Cephalosporin use.  . Erythromycin Ethylsuccinate Other (See Comments)    Reaction:  Eye irritation     Consultations:  ID   Procedures/Studies: Dg Chest Port 1 View  Result Date: 10/24/2016 CLINICAL DATA:  Fever.  Hematuria. EXAM: PORTABLE CHEST 1 VIEW COMPARISON:  09/16/2016 FINDINGS: Shallow inspiration. No airspace consolidation. Mild unchanged interstitial coarsening. IMPRESSION: No consolidation or large effusion. Electronically Signed   By: Andreas Newport M.D.   On: 10/24/2016 22:41      Discharge Exam: Vitals:   11/01/16 2035 11/02/16 0438  BP: (!) 142/69 136/74  Pulse: 62 (!) 56  Resp: 16 18  Temp: 98 F (36.7 C) 98.3 F (36.8 C)  SpO2: 96% 96%   Vitals:   11/01/16 0452 11/01/16 1437 11/01/16 2035 11/02/16 0438  BP: 140/67 130/75 (!) 142/69 136/74  Pulse: (!) 55 61 62 (!) 56  Resp: 18 18 16 18   Temp: (!) 97.5 F (36.4 C) 98.4 F (36.9 C) 98 F (36.7 C) 98.3 F (36.8 C)  TempSrc: Oral Axillary Oral Oral  SpO2: 95% 95% 96% 96%  Weight:      Height:        General: Pt is alert, awake, not in acute distress Cardiovascular: RRR, S1/S2 +, no rubs, no gallops Respiratory: CTA bilaterally, no wheezing, no rhonchi Abdominal: Soft, NT, ND, bowel sounds + Extremities: no edema, no cyanosis    The results of significant diagnostics from this  hospitalization (including imaging, microbiology, ancillary and laboratory) are listed below for reference.     Microbiology: Recent Results (from the past 240 hour(s))  Culture, blood (routine x 2)     Status: Abnormal   Collection Time: 10/24/16  9:59 PM  Result Value Ref Range Status   Specimen Description BLOOD LEFT ANTECUBITAL  Final   Special Requests   Final    BOTTLES DRAWN AEROBIC AND ANAEROBIC Blood Culture adequate volume   Culture  Setup Time   Final    GRAM NEGATIVE RODS IN BOTH AEROBIC AND ANAEROBIC BOTTLES GRAM POSITIVE COCCI IN PAIRS IN CHAINS ANAEROBIC BOTTLE ONLY CRITICAL RESULT CALLED TO, READ BACK BY AND VERIFIED WITH: Yevette Edwards [phone] MLM Performed at [facility], [place] [address], [place], [place] [address]    Culture (A)  Final    ENTEROBACTER CLOACAE CORRECTED RESULTS PREVIOUSLY REPORTED AS: KLEBSIELLA PNEUMONIAE CORRECTED RESULTS CALLED TO: J FRENS 10/27/16 @ [place]    Report Status 10/28/2016 FINAL  Final   Organism ID, Bacteria ENTEROBACTER CLOACAE  Final   Organism ID, Bacteria ENTEROCOCCUS FAECALIS  Final      Susceptibility   Enterobacter cloacae - MIC*    CEFAZOLIN >=64 RESISTANT Resistant     CEFEPIME <=1 SENSITIVE Sensitive     CEFTAZIDIME <=1 SENSITIVE Sensitive     CEFTRIAXONE 8 SENSITIVE Sensitive  CIPROFLOXACIN <=0.25 SENSITIVE Sensitive     GENTAMICIN <=1 SENSITIVE Sensitive     IMIPENEM <=0.25 SENSITIVE Sensitive     TRIMETH/SULFA <=20 SENSITIVE Sensitive     PIP/TAZO <=4 SENSITIVE Sensitive     * ENTEROBACTER CLOACAE   Enterococcus faecalis - MIC*    AMPICILLIN <=2 SENSITIVE Sensitive     VANCOMYCIN 1 SENSITIVE Sensitive     GENTAMICIN SYNERGY SENSITIVE Sensitive     * ENTEROCOCCUS FAECALIS  Blood Culture ID Panel (Reflexed)     Status: Abnormal   Collection Time: 10/24/16  9:59 PM  Result Value Ref Range Status   Enterococcus species NOT DETECTED NOT DETECTED Final   Vancomycin  resistance NOT DETECTED NOT DETECTED Final   Listeria monocytogenes NOT DETECTED NOT DETECTED Final   Staphylococcus species NOT DETECTED NOT DETECTED Final   Staphylococcus aureus NOT DETECTED NOT DETECTED Final   Streptococcus species NOT DETECTED NOT DETECTED Final   Streptococcus agalactiae NOT DETECTED NOT DETECTED Final   Streptococcus pneumoniae NOT DETECTED NOT DETECTED Final   Streptococcus pyogenes NOT DETECTED NOT DETECTED Final   Acinetobacter baumannii NOT DETECTED NOT DETECTED Final   Enterobacteriaceae species DETECTED (A) NOT DETECTED Final    Comment: Enterobacteriaceae represent a large family of gram-negative bacteria, not a single organism. CRITICAL RESULT CALLED TO, READ BACK BY AND VERIFIED WITH: PHARMD L POINDEXTER [phone] MLM    Enterobacter cloacae complex DETECTED (A) NOT DETECTED Final    Comment: CRITICAL RESULT CALLED TO, READ BACK BY AND VERIFIED WITH: PHARMD L POINDEXTER [phone] MLM    Escherichia coli NOT DETECTED NOT DETECTED Final   Klebsiella oxytoca NOT DETECTED NOT DETECTED Final   Klebsiella pneumoniae NOT DETECTED NOT DETECTED Final   Proteus species NOT DETECTED NOT DETECTED Final   Serratia marcescens NOT DETECTED NOT DETECTED Final   Carbapenem resistance NOT DETECTED NOT DETECTED Final   Haemophilus influenzae NOT DETECTED NOT DETECTED Final   Neisseria meningitidis NOT DETECTED NOT DETECTED Final   Pseudomonas aeruginosa NOT DETECTED NOT DETECTED Final   Candida albicans NOT DETECTED NOT DETECTED Final   Candida glabrata NOT DETECTED NOT DETECTED Final   Candida krusei NOT DETECTED NOT DETECTED Final   Candida parapsilosis NOT DETECTED NOT DETECTED Final   Candida tropicalis NOT DETECTED NOT DETECTED Final    Comment: Performed at [facility], [place] [address] [place], [place] [address]  Urine culture     Status: None   Collection Time: 10/24/16 10:04 PM  Result Value Ref Range Status   Specimen Description URINE, RANDOM   Final   Special Requests NONE  Final   Culture   Final    NO GROWTH Performed at [facility], [place] [address] [place], [place] [address]    Report Status 10/27/2016 FINAL  Final  Culture, blood (routine x 2)     Status: Abnormal   Collection Time: 10/24/16 10:06 PM  Result Value Ref Range Status   Specimen Description BLOOD RIGHT ANTECUBITAL  Final   Special Requests   Final    BOTTLES DRAWN AEROBIC AND ANAEROBIC Blood Culture adequate volume   Culture  Setup Time   Final    GRAM NEGATIVE RODS IN BOTH AEROBIC AND ANAEROBIC BOTTLES CRITICAL VALUE NOTED.  VALUE IS CONSISTENT WITH PREVIOUSLY REPORTED AND CALLED VALUE.    Culture (A)  Final    ENTEROBACTER CLOACAE SUSCEPTIBILITIES PERFORMED ON PREVIOUS CULTURE WITHIN THE LAST 5 DAYS. Performed at [facility], [place]  [address], [place], [place] [address]    Report Status 10/28/2016 FINAL  Final  MRSA PCR Screening     Status: None   Collection Time: 10/25/16 12:47 AM  Result Value Ref Range Status   MRSA by PCR NEGATIVE NEGATIVE Final    Comment:        The GeneXpert MRSA Assay (FDA approved for NASAL specimens only), is one component of a comprehensive MRSA colonization surveillance program. It is not intended to diagnose MRSA infection nor to guide or monitor treatment for MRSA infections.   Culture, blood (routine x 2)     Status: None (Preliminary result)   Collection Time: 10/28/16  2:53 PM  Result Value Ref Range Status   Specimen Description BLOOD RIGHT ANTECUBITAL  Final   Special Requests   Final    BOTTLES DRAWN AEROBIC ONLY Blood Culture adequate volume   Culture   Final    NO GROWTH 4 DAYS Performed at [facility], [address], [place], [place] [address]    Report Status PENDING  Incomplete  Culture, blood (routine x 2)     Status: None (Preliminary result)   Collection Time: 10/28/16  2:59 PM  Result Value Ref Range Status   Specimen Description BLOOD RIGHT ANTECUBITAL  Final    Special Requests   Final    BOTTLES DRAWN AEROBIC ONLY Blood Culture adequate volume   Culture   Final    NO GROWTH 4 DAYS Performed at [facility], [place] [address] [place], [place] [address]    Report Status PENDING  Incomplete     Labs: BNP (last 3 results) No results for input(s): BNP in the last 8760 hours. Basic Metabolic Panel:  Recent Labs Lab 10/27/16 0254 10/28/16 0742  NA 145 145  K 3.3* 3.7  CL 119* 119*  CO2 18* 23  GLUCOSE 171* 115*  BUN 14 14  CREATININE 0.88 1.03  CALCIUM 8.0* 8.2*   Liver Function Tests: No results for input(s): AST, ALT, ALKPHOS, BILITOT, PROT, ALBUMIN in the last 168 hours. No results for input(s): LIPASE, AMYLASE in the last 168 hours. No results for input(s): AMMONIA in the last 168 hours. CBC:  Recent Labs Lab 10/27/16 0254 10/28/16 0742  WBC 12.9* 10.4  HGB 8.7* 8.8*  HCT 24.9* 26.2*  MCV 92.2 91.3  PLT 79* 100*   Cardiac Enzymes: No results for input(s): CKTOTAL, CKMB, CKMBINDEX, TROPONINI in the last 168 hours. BNP: Invalid input(s): POCBNP CBG: No results for input(s): GLUCAP in the last 168 hours. D-Dimer No results for input(s): DDIMER in the last 72 hours. Hgb A1c No results for input(s): HGBA1C in the last 72 hours. Lipid Profile No results for input(s): CHOL, HDL, LDLCALC, TRIG, CHOLHDL, LDLDIRECT in the last 72 hours. Thyroid function studies No results for input(s): TSH, T4TOTAL, T3FREE, THYROIDAB in the last 72 hours.  Invalid input(s): FREET3 Anemia work up No results for input(s): VITAMINB12, FOLATE, FERRITIN, TIBC, IRON, RETICCTPCT in the last 72 hours. Urinalysis    Component Value Date/Time   COLORURINE YELLOW 10/25/2016 0619   APPEARANCEUR HAZY (A) 10/25/2016 0619   LABSPEC 1.005 10/25/2016 0619   PHURINE 5.0 10/25/2016 0619   GLUCOSEU NEGATIVE 10/25/2016 0619   HGBUR LARGE (A) 10/25/2016 0619   BILIRUBINUR NEGATIVE 10/25/2016 [place] 10/25/2016 0619   PROTEINUR  NEGATIVE 10/25/2016 0619   UROBILINOGEN 0.2 07/21/2013 1119   NITRITE NEGATIVE 10/25/2016 0619   LEUKOCYTESUR SMALL (A) 10/25/2016 7654  Sepsis Labs Invalid input(s): PROCALCITONIN,  WBC,  LACTICIDVEN Microbiology Recent Results (from the past 240 hour(s))  Culture, blood (routine x 2)     Status: Abnormal   Collection Time: 10/24/16  9:59 PM  Result Value Ref Range Status   Specimen Description BLOOD LEFT ANTECUBITAL  Final   Special Requests   Final    BOTTLES DRAWN AEROBIC AND ANAEROBIC Blood Culture adequate volume   Culture  Setup Time   Final    GRAM NEGATIVE RODS IN BOTH AEROBIC AND ANAEROBIC BOTTLES GRAM POSITIVE COCCI IN PAIRS IN CHAINS ANAEROBIC BOTTLE ONLY CRITICAL RESULT CALLED TO, READ BACK BY AND VERIFIED WITH: Yevette Edwards [phone] MLM Performed at [facility], [place] [address] [place], [place] [address]    Culture (A)  Final    ENTEROBACTER CLOACAE CORRECTED RESULTS PREVIOUSLY REPORTED AS: KLEBSIELLA PNEUMONIAE CORRECTED RESULTS CALLED TO: J FRENS 10/27/16 @ 1311 M VESTAL ENTEROCOCCUS FAECALIS    Report Status 10/28/2016 FINAL  Final   Organism ID, Bacteria ENTEROBACTER CLOACAE  Final   Organism ID, Bacteria ENTEROCOCCUS FAECALIS  Final      Susceptibility   Enterobacter cloacae - MIC*    CEFAZOLIN >=64 RESISTANT Resistant     CEFEPIME <=1 SENSITIVE Sensitive     CEFTAZIDIME <=1 SENSITIVE Sensitive     CEFTRIAXONE 8 SENSITIVE Sensitive     CIPROFLOXACIN <=0.25 SENSITIVE Sensitive     GENTAMICIN <=1 SENSITIVE Sensitive     IMIPENEM <=0.25 SENSITIVE Sensitive     TRIMETH/SULFA <=20 SENSITIVE Sensitive     PIP/TAZO <=4 SENSITIVE Sensitive     * ENTEROBACTER CLOACAE   Enterococcus faecalis - MIC*    AMPICILLIN <=2 SENSITIVE Sensitive     VANCOMYCIN 1 SENSITIVE Sensitive     GENTAMICIN SYNERGY SENSITIVE Sensitive     * ENTEROCOCCUS FAECALIS  Blood Culture ID Panel (Reflexed)     Status: Abnormal   Collection Time: 10/24/16  9:59 PM  Result  Value Ref Range Status   Enterococcus species NOT DETECTED NOT DETECTED Final   Vancomycin resistance NOT DETECTED NOT DETECTED Final   Listeria monocytogenes NOT DETECTED NOT DETECTED Final   Staphylococcus species NOT DETECTED NOT DETECTED Final   Staphylococcus aureus NOT DETECTED NOT DETECTED Final   Streptococcus species NOT DETECTED NOT DETECTED Final   Streptococcus agalactiae NOT DETECTED NOT DETECTED Final   Streptococcus pneumoniae NOT DETECTED NOT DETECTED Final   Streptococcus pyogenes NOT DETECTED NOT DETECTED Final   Acinetobacter baumannii NOT DETECTED NOT DETECTED Final   Enterobacteriaceae species DETECTED (A) NOT DETECTED Final    Comment: Enterobacteriaceae represent a large family of gram-negative bacteria, not a single organism. CRITICAL RESULT CALLED TO, READ BACK BY AND VERIFIED WITH: PHARMD L POINDEXTER [phone] MLM    Enterobacter cloacae complex DETECTED (A) NOT DETECTED Final    Comment: CRITICAL RESULT CALLED TO, READ BACK BY AND VERIFIED WITH: PHARMD L POINDEXTER [phone] MLM    Escherichia coli NOT DETECTED NOT DETECTED Final   Klebsiella oxytoca NOT DETECTED NOT DETECTED Final   Klebsiella pneumoniae NOT DETECTED NOT DETECTED Final   Proteus species NOT DETECTED NOT DETECTED Final   Serratia marcescens NOT DETECTED NOT DETECTED Final   Carbapenem resistance NOT DETECTED NOT DETECTED Final   Haemophilus influenzae NOT DETECTED NOT DETECTED Final   Neisseria meningitidis NOT DETECTED NOT DETECTED Final   Pseudomonas aeruginosa NOT DETECTED NOT DETECTED Final   Candida albicans NOT DETECTED NOT DETECTED Final   Candida glabrata  NOT DETECTED NOT DETECTED Final   Candida krusei NOT DETECTED NOT DETECTED Final   Candida parapsilosis NOT DETECTED NOT DETECTED Final   Candida tropicalis NOT DETECTED NOT DETECTED Final    Comment: Performed at [facility], [place] [address], [place], [place] [address]  Urine culture     Status: None   Collection  Time: 10/24/16 10:04 PM  Result Value Ref Range Status   Specimen Description URINE, RANDOM  Final   Special Requests NONE  Final   Culture   Final    NO GROWTH Performed at [facility], [place] [address], [place], [place] [address]    Report Status 10/27/2016 FINAL  Final  Culture, blood (routine x 2)     Status: Abnormal   Collection Time: 10/24/16 10:06 PM  Result Value Ref Range Status   Specimen Description BLOOD RIGHT ANTECUBITAL  Final   Special Requests   Final    BOTTLES DRAWN AEROBIC AND ANAEROBIC Blood Culture adequate volume   Culture  Setup Time   Final    GRAM NEGATIVE RODS IN BOTH AEROBIC AND ANAEROBIC BOTTLES CRITICAL VALUE NOTED.  VALUE IS CONSISTENT WITH PREVIOUSLY REPORTED AND CALLED VALUE.    Culture (A)  Final    ENTEROBACTER CLOACAE SUSCEPTIBILITIES PERFORMED ON PREVIOUS CULTURE WITHIN THE LAST 5 DAYS. Performed at [facility], [place] [address] [place], [place] [address]    Report Status 10/28/2016 FINAL  Final  MRSA PCR Screening     Status: None   Collection Time: 10/25/16 12:47 AM  Result Value Ref Range Status   MRSA by PCR NEGATIVE NEGATIVE Final    Comment:        The GeneXpert MRSA Assay (FDA approved for NASAL specimens only), is one component of a comprehensive MRSA colonization surveillance program. It is not intended to diagnose MRSA infection nor to guide or monitor treatment for MRSA infections.   Culture, blood (routine x 2)     Status: None (Preliminary result)   Collection Time: 10/28/16  2:53 PM  Result Value Ref Range Status   Specimen Description BLOOD RIGHT ANTECUBITAL  Final   Special Requests   Final    BOTTLES DRAWN AEROBIC ONLY Blood Culture adequate volume   Culture   Final    NO GROWTH 4 DAYS Performed at [facility], [address], [place], [place] [address]    Report Status PENDING  Incomplete  Culture, blood (routine x 2)     Status: None (Preliminary result)   Collection Time: 10/28/16  2:59 PM   Result Value Ref Range Status   Specimen Description BLOOD RIGHT ANTECUBITAL  Final   Special Requests   Final    BOTTLES DRAWN AEROBIC ONLY Blood Culture adequate volume   Culture   Final    NO GROWTH 4 DAYS Performed at [facility], [place] [address] [place], [place] [address]    Report Status PENDING  Incomplete     Time coordinating discharge: 40 minutes  SIGNED:  Dessa Phi, DO [facility] Pager [phone]  If 7PM-7AM, please contact night-coverage [email or web address] Password [email or web address] 11/02/2016, 12:11 PM

## 2016-11-02 NOTE — Progress Notes (Signed)
Pt plan to discharge to SNF. CSW following.

## 2016-11-02 NOTE — Progress Notes (Signed)
Physical Therapy Treatment Patient Details Name: Ricky Boyd MRN: [id number] DOB: 05/17/1940 Today's Date: 11/02/2016    History of Present Illness 76 y.o. male with medical history significant of prostate cancer, mild dementia, and gout presenting with gross hematuria.  About 4:30 on 9/1 his caregiver arrived and he was not wearing his catheter bag.  He had been wearing it at 4:15 when his son left.  There was blood around the commode where he had removed it.  She put the catheter bag back on and it filled with blood. Previous admission for UTI on 7/25-31. More confused than usual while in the ER only.     PT Comments    Pt assisted with ambulating in hallway.    Follow Up Recommendations  SNF;Supervision/Assistance - 24 hour     Equipment Recommendations  None recommended by PT    Recommendations for Other Services       Precautions / Restrictions Precautions Precautions: Fall Precaution Comments: dementia Restrictions Weight Bearing Restrictions: No    Mobility  Bed Mobility Overal bed mobility: Needs Assistance         Sit to supine: Supervision   General bed mobility comments: verbal cues for repositioning self once in supine  Transfers Overall transfer level: Needs assistance Equipment used: Rolling walker (2 wheeled) Transfers: Sit to/from Stand Sit to Stand: Min assist         General transfer comment: assist to rise from toilet with RN, verbal cues for hand placement and safety  Ambulation/Gait Ambulation/Gait assistance: Min guard Ambulation Distance (Feet): 160 Feet Assistive device: Rolling walker (2 wheeled) Gait Pattern/deviations: Step-through pattern;Decreased stride length;Trunk flexed Gait velocity: decr   General Gait Details: verbal cues for RW positioning, HR 140s bpm when in bathroom with RN and 119 bpm during gait; 91 bpm upon return to supine   Stairs            Wheelchair Mobility    Modified Rankin (Stroke Patients  Only)       Balance                                            Cognition Arousal/Alertness: Awake/alert Behavior During Therapy: WFL for tasks assessed/performed Overall Cognitive Status: History of cognitive impairments - at baseline                                 General Comments: requries multimodal cues and increased time, HOH as well       Exercises      General Comments        Pertinent Vitals/Pain Pain Assessment: No/denies pain    Home Living                      Prior Function            PT Goals (current goals can now be found in the care plan section) Progress towards PT goals: Progressing toward goals    Frequency    Min 2X/week      PT Plan Current plan remains appropriate    Co-evaluation              AM-PAC PT "6 Clicks" Daily Activity  Outcome Measure  Difficulty turning over in bed (including adjusting bedclothes, sheets and blankets)?: A Little Difficulty moving from  lying on back to sitting on the side of the bed? : A Lot Difficulty sitting down on and standing up from a chair with arms (e.g., wheelchair, bedside commode, etc,.)?: Unable Help needed moving to and from a bed to chair (including a wheelchair)?: A Little Help needed walking in hospital room?: A Little Help needed climbing 3-5 steps with a railing? : A Lot 6 Click Score: 14    End of Session Equipment Utilized During Treatment: Gait belt Activity Tolerance: Patient tolerated treatment well Patient left: in bed;with call bell/phone within reach;with bed alarm set;with family/visitor present Nurse Communication: Mobility status PT Visit Diagnosis: Difficulty in walking, not elsewhere classified (R26.2)     Time: 8115-7262 PT Time Calculation (min) (ACUTE ONLY): 13 min  Charges:  $Gait Training: 8-22 mins                    G Codes:       Carmelia Bake, PT, DPT 11/02/2016 Pager: [phone]   Ricky Boyd  E 11/02/2016, 1:28 PM

## 2016-11-09 ENCOUNTER — Encounter (HOSPITAL_COMMUNITY): Payer: Self-pay | Admitting: Emergency Medicine

## 2016-11-09 ENCOUNTER — Inpatient Hospital Stay (HOSPITAL_COMMUNITY): Payer: Medicare Other

## 2016-11-09 ENCOUNTER — Emergency Department (HOSPITAL_COMMUNITY): Payer: Medicare Other

## 2016-11-09 ENCOUNTER — Inpatient Hospital Stay (HOSPITAL_COMMUNITY)
Admission: EM | Admit: 2016-11-09 | Discharge: 2016-11-13 | DRG: 193 | Disposition: A | Discharge status: Skilled Nursing Facility | Payer: Medicare Other | Attending: Internal Medicine | Admitting: Internal Medicine

## 2016-11-09 DIAGNOSIS — Z66 Do not resuscitate: Secondary | ICD-10-CM | POA: Diagnosis present

## 2016-11-09 DIAGNOSIS — M25571 Pain in right ankle and joints of right foot: Secondary | ICD-10-CM | POA: Diagnosis present

## 2016-11-09 DIAGNOSIS — J189 Pneumonia, unspecified organism: Principal | ICD-10-CM | POA: Diagnosis present

## 2016-11-09 DIAGNOSIS — H9190 Unspecified hearing loss, unspecified ear: Secondary | ICD-10-CM | POA: Diagnosis present

## 2016-11-09 DIAGNOSIS — N320 Bladder-neck obstruction: Secondary | ICD-10-CM | POA: Diagnosis present

## 2016-11-09 DIAGNOSIS — R627 Adult failure to thrive: Secondary | ICD-10-CM | POA: Diagnosis present

## 2016-11-09 DIAGNOSIS — G9340 Encephalopathy, unspecified: Secondary | ICD-10-CM | POA: Diagnosis present

## 2016-11-09 DIAGNOSIS — R310 Gross hematuria: Secondary | ICD-10-CM | POA: Diagnosis present

## 2016-11-09 DIAGNOSIS — F0390 Unspecified dementia without behavioral disturbance: Secondary | ICD-10-CM | POA: Diagnosis present

## 2016-11-09 DIAGNOSIS — Z880 Allergy status to penicillin: Secondary | ICD-10-CM | POA: Diagnosis not present

## 2016-11-09 DIAGNOSIS — J8410 Pulmonary fibrosis, unspecified: Secondary | ICD-10-CM | POA: Diagnosis present

## 2016-11-09 DIAGNOSIS — R339 Retention of urine, unspecified: Secondary | ICD-10-CM | POA: Diagnosis present

## 2016-11-09 DIAGNOSIS — Y732 Prosthetic and other implants, materials and accessory gastroenterology and urology devices associated with adverse incidents: Secondary | ICD-10-CM | POA: Diagnosis not present

## 2016-11-09 DIAGNOSIS — Z881 Allergy status to other antibiotic agents status: Secondary | ICD-10-CM | POA: Diagnosis not present

## 2016-11-09 DIAGNOSIS — M1990 Unspecified osteoarthritis, unspecified site: Secondary | ICD-10-CM | POA: Diagnosis present

## 2016-11-09 DIAGNOSIS — Z79899 Other long term (current) drug therapy: Secondary | ICD-10-CM | POA: Diagnosis not present

## 2016-11-09 DIAGNOSIS — Z960 Presence of urogenital implants: Secondary | ICD-10-CM | POA: Diagnosis present

## 2016-11-09 DIAGNOSIS — Z885 Allergy status to narcotic agent status: Secondary | ICD-10-CM

## 2016-11-09 DIAGNOSIS — R609 Edema, unspecified: Secondary | ICD-10-CM | POA: Diagnosis not present

## 2016-11-09 DIAGNOSIS — Z87891 Personal history of nicotine dependence: Secondary | ICD-10-CM

## 2016-11-09 DIAGNOSIS — T83011A Breakdown (mechanical) of indwelling urethral catheter, initial encounter: Secondary | ICD-10-CM | POA: Diagnosis not present

## 2016-11-09 DIAGNOSIS — Z888 Allergy status to other drugs, medicaments and biological substances status: Secondary | ICD-10-CM | POA: Diagnosis not present

## 2016-11-09 DIAGNOSIS — R509 Fever, unspecified: Secondary | ICD-10-CM | POA: Diagnosis not present

## 2016-11-09 DIAGNOSIS — F0150 Vascular dementia without behavioral disturbance: Secondary | ICD-10-CM | POA: Diagnosis not present

## 2016-11-09 DIAGNOSIS — Y92230 Patient room in hospital as the place of occurrence of the external cause: Secondary | ICD-10-CM | POA: Diagnosis not present

## 2016-11-09 DIAGNOSIS — G9341 Metabolic encephalopathy: Secondary | ICD-10-CM | POA: Diagnosis not present

## 2016-11-09 DIAGNOSIS — I712 Thoracic aortic aneurysm, without rupture: Secondary | ICD-10-CM | POA: Diagnosis present

## 2016-11-09 DIAGNOSIS — M109 Gout, unspecified: Secondary | ICD-10-CM | POA: Diagnosis present

## 2016-11-09 DIAGNOSIS — R338 Other retention of urine: Secondary | ICD-10-CM | POA: Diagnosis not present

## 2016-11-09 DIAGNOSIS — Z8546 Personal history of malignant neoplasm of prostate: Secondary | ICD-10-CM | POA: Diagnosis not present

## 2016-11-09 DIAGNOSIS — Z9289 Personal history of other medical treatment: Secondary | ICD-10-CM | POA: Diagnosis not present

## 2016-11-09 DIAGNOSIS — E876 Hypokalemia: Secondary | ICD-10-CM | POA: Diagnosis present

## 2016-11-09 DIAGNOSIS — M1009 Idiopathic gout, multiple sites: Secondary | ICD-10-CM | POA: Diagnosis not present

## 2016-11-09 DIAGNOSIS — Y95 Nosocomial condition: Secondary | ICD-10-CM | POA: Diagnosis present

## 2016-11-09 DIAGNOSIS — R52 Pain, unspecified: Secondary | ICD-10-CM

## 2016-11-09 LAB — CBC WITH DIFFERENTIAL/PLATELET
BASOS ABS: 0 10*3/uL (ref 0.0–0.1)
BASOS PCT: 0 %
EOS ABS: 0.1 10*3/uL (ref 0.0–0.7)
EOS PCT: 1 %
HCT: 30.8 % — ABNORMAL LOW (ref 39.0–52.0)
Hemoglobin: 10.5 g/dL — ABNORMAL LOW (ref 13.0–17.0)
Lymphocytes Relative: 14 %
Lymphs Abs: 0.8 10*3/uL (ref 0.7–4.0)
MCH: 31.4 pg (ref 26.0–34.0)
MCHC: 34.1 g/dL (ref 30.0–36.0)
MCV: 92.2 fL (ref 78.0–100.0)
MONO ABS: 0.3 10*3/uL (ref 0.1–1.0)
MONOS PCT: 5 %
NEUTROS ABS: 4.3 10*3/uL (ref 1.7–7.7)
Neutrophils Relative %: 80 %
PLATELETS: 125 10*3/uL — AB (ref 150–400)
RBC: 3.34 MIL/uL — ABNORMAL LOW (ref 4.22–5.81)
RDW: 15.2 % (ref 11.5–15.5)
WBC: 5.4 10*3/uL (ref 4.0–10.5)

## 2016-11-09 LAB — COMPREHENSIVE METABOLIC PANEL
ALBUMIN: 3 g/dL — AB (ref 3.5–5.0)
ALK PHOS: 54 U/L (ref 38–126)
ALT: 21 U/L (ref 17–63)
AST: 20 U/L (ref 15–41)
Anion gap: 11 (ref 5–15)
BILIRUBIN TOTAL: 0.9 mg/dL (ref 0.3–1.2)
BUN: 9 mg/dL (ref 6–20)
CALCIUM: 8.3 mg/dL — AB (ref 8.9–10.3)
CO2: 28 mmol/L (ref 22–32)
Chloride: 98 mmol/L — ABNORMAL LOW (ref 101–111)
Creatinine, Ser: 0.94 mg/dL (ref 0.61–1.24)
GFR calc Af Amer: >60 mL/min (ref >60)
GLUCOSE: 112 mg/dL — AB (ref 65–99)
POTASSIUM: 3.1 mmol/L — AB (ref 3.5–5.1)
Sodium: 137 mmol/L (ref 135–145)
TOTAL PROTEIN: 6.5 g/dL (ref 6.5–8.1)

## 2016-11-09 LAB — URINALYSIS, ROUTINE W REFLEX MICROSCOPIC
BILIRUBIN URINE: NEGATIVE
Bacteria, UA: NONE SEEN
GLUCOSE, UA: NEGATIVE mg/dL
Ketones, ur: NEGATIVE mg/dL
Leukocytes, UA: NEGATIVE
NITRITE: NEGATIVE
PH: 8 (ref 5.0–8.0)
Protein, ur: NEGATIVE mg/dL
SPECIFIC GRAVITY, URINE: 1.011 (ref 1.005–1.030)
Squamous Epithelial / LPF: NONE SEEN

## 2016-11-09 LAB — I-STAT CG4 LACTIC ACID, ED
LACTIC ACID, VENOUS: 0.88 mmol/L (ref 0.5–1.9)
LACTIC ACID, VENOUS: 1.19 mmol/L (ref 0.5–1.9)

## 2016-11-09 MED ORDER — SODIUM CHLORIDE 0.9 % IV SOLN
INTRAVENOUS | Status: DC
  Administered 2016-11-09 – 2016-11-11 (×4): via INTRAVENOUS

## 2016-11-09 MED ORDER — TRAMADOL HCL 50 MG PO TABS
50.0000 mg | ORAL_TABLET | Freq: Three times a day (TID) | ORAL | Status: DC | PRN
  Administered 2016-11-09 – 2016-11-12 (×4): 50 mg via ORAL
  Filled 2016-11-09 (×5): qty 1

## 2016-11-09 MED ORDER — QUETIAPINE FUMARATE 25 MG PO TABS
12.5000 mg | ORAL_TABLET | Freq: Every day | ORAL | Status: DC
  Administered 2016-11-09 – 2016-11-12 (×4): 12.5 mg via ORAL
  Filled 2016-11-09 (×4): qty 1

## 2016-11-09 MED ORDER — COLCHICINE 0.6 MG PO TABS: 0.6000 mg | ORAL_TABLET | Freq: Every day | ORAL | Status: DC

## 2016-11-09 MED ORDER — ACETAMINOPHEN 325 MG PO TABS
650.0000 mg | ORAL_TABLET | Freq: Once | ORAL | Status: AC
  Administered 2016-11-09: 650 mg via ORAL
  Filled 2016-11-09: qty 2

## 2016-11-09 MED ORDER — DONEPEZIL HCL 10 MG PO TABS
10.0000 mg | ORAL_TABLET | Freq: Every day | ORAL | Status: DC
  Administered 2016-11-09 – 2016-11-13 (×4): 10 mg via ORAL
  Filled 2016-11-09 (×5): qty 1

## 2016-11-09 MED ORDER — COLCHICINE 0.6 MG PO TABS
0.6000 mg | ORAL_TABLET | Freq: Three times a day (TID) | ORAL | Status: DC
  Administered 2016-11-09 – 2016-11-13 (×12): 0.6 mg via ORAL
  Filled 2016-11-09 (×13): qty 1

## 2016-11-09 MED ORDER — LIDOCAINE HCL 1 % IJ SOLN
5.0000 mL | Freq: Once | INTRAMUSCULAR | Status: DC
  Filled 2016-11-09: qty 5

## 2016-11-09 MED ORDER — POTASSIUM CHLORIDE CRYS ER 20 MEQ PO TBCR
40.0000 meq | EXTENDED_RELEASE_TABLET | Freq: Once | ORAL | Status: AC
  Administered 2016-11-09: 40 meq via ORAL
  Filled 2016-11-09: qty 2

## 2016-11-09 MED ORDER — SENNOSIDES-DOCUSATE SODIUM 8.6-50 MG PO TABS
1.0000 | ORAL_TABLET | Freq: Every day | ORAL | Status: DC
  Administered 2016-11-09 – 2016-11-13 (×5): 1 via ORAL
  Filled 2016-11-09 (×5): qty 1

## 2016-11-09 MED ORDER — TAMSULOSIN HCL 0.4 MG PO CAPS
0.4000 mg | ORAL_CAPSULE | Freq: Every day | ORAL | Status: DC
  Administered 2016-11-09 – 2016-11-12 (×4): 0.4 mg via ORAL
  Filled 2016-11-09 (×4): qty 1

## 2016-11-09 MED ORDER — SODIUM CHLORIDE 0.9 % IV BOLUS (SEPSIS)
1000.0000 mL | Freq: Once | INTRAVENOUS | Status: AC
  Administered 2016-11-09: 1000 mL via INTRAVENOUS

## 2016-11-09 MED ORDER — VANCOMYCIN HCL IN DEXTROSE 1-5 GM/200ML-% IV SOLN
1000.0000 mg | Freq: Once | INTRAVENOUS | Status: AC
  Administered 2016-11-09: 1000 mg via INTRAVENOUS
  Filled 2016-11-09: qty 200

## 2016-11-09 MED ORDER — ACETAMINOPHEN 650 MG RE SUPP: 650.0000 mg | Freq: Once | RECTAL | Status: DC

## 2016-11-09 MED ORDER — BUPIVACAINE HCL (PF) 0.5 % IJ SOLN: 10.0000 mL | Freq: Once | INTRAMUSCULAR | Status: DC

## 2016-11-09 MED ORDER — PREDNISONE 20 MG PO TABS
30.0000 mg | ORAL_TABLET | Freq: Every day | ORAL | Status: DC
  Administered 2016-11-10 – 2016-11-13 (×4): 30 mg via ORAL
  Filled 2016-11-09 (×4): qty 1

## 2016-11-09 MED ORDER — METHYLPREDNISOLONE ACETATE 40 MG/ML IJ SUSP
10.0000 mg | Freq: Once | INTRAMUSCULAR | Status: DC
  Filled 2016-11-09: qty 1

## 2016-11-09 MED ORDER — MEMANTINE HCL ER 28 MG PO CP24
28.0000 mg | ORAL_CAPSULE | Freq: Every day | ORAL | Status: DC
  Administered 2016-11-09 – 2016-11-13 (×4): 28 mg via ORAL
  Filled 2016-11-09 (×5): qty 1

## 2016-11-09 MED ORDER — MEMANTINE HCL-DONEPEZIL HCL ER 28-10 MG PO CP24: ORAL_CAPSULE | Freq: Every day | ORAL | Status: DC

## 2016-11-09 MED ORDER — VANCOMYCIN HCL IN DEXTROSE 1-5 GM/200ML-% IV SOLN
1000.0000 mg | Freq: Two times a day (BID) | INTRAVENOUS | Status: DC
  Administered 2016-11-09 – 2016-11-10 (×2): 1000 mg via INTRAVENOUS
  Filled 2016-11-09 (×2): qty 200

## 2016-11-09 MED ORDER — AZTREONAM IN DEXTROSE 1 GM/50ML IV SOLN
1.0000 g | Freq: Once | INTRAVENOUS | Status: AC
  Administered 2016-11-09: 1 g via INTRAVENOUS
  Filled 2016-11-09: qty 50

## 2016-11-09 MED ORDER — DEXTROSE 5 % IV SOLN
1.0000 g | Freq: Three times a day (TID) | INTRAVENOUS | Status: DC
  Administered 2016-11-10 – 2016-11-11 (×5): 1 g via INTRAVENOUS
  Filled 2016-11-09 (×7): qty 1

## 2016-11-09 MED ORDER — ALBUTEROL SULFATE (2.5 MG/3ML) 0.083% IN NEBU: 2.5000 mg | INHALATION_SOLUTION | Freq: Four times a day (QID) | RESPIRATORY_TRACT | Status: DC | PRN

## 2016-11-09 MED ORDER — SODIUM CHLORIDE 0.9 % IV BOLUS (SEPSIS)
500.0000 mL | Freq: Once | INTRAVENOUS | Status: AC
  Administered 2016-11-09: 500 mL via INTRAVENOUS

## 2016-11-09 MED ORDER — HEPARIN SODIUM (PORCINE) 5000 UNIT/ML IJ SOLN
5000.0000 [IU] | Freq: Three times a day (TID) | INTRAMUSCULAR | Status: DC
  Administered 2016-11-09 – 2016-11-13 (×11): 5000 [IU] via SUBCUTANEOUS
  Filled 2016-11-09 (×11): qty 1

## 2016-11-09 NOTE — H&P (Signed)
History and Physical  Aleem Elza YWV:[id number] DOB: 24-Jul-1940 DOA: 11/09/2016  PCP:  Lucille Passy, MD   Chief Complaint:  Fatigue Dyspnea   History of Present Illness:  Pt is a 76 yo male with hx of dementia, prostate cancer, gout who was brought by family with cc of fatigue/dyspnea for 2-3 days with no significant cough but with fever/chills. Pt is a very poor historian due to dementia and is hard hearing so hx was taken from family and records. No other complaints per family. Pt looked in no acute distress and nodded no when asked about having any pain or anything bothering him.   Review of Systems:  CONSTITUTIONAL:      +fatigue.  +fever. +chills. RESPIRATORY:           No cough.  No wheeze.  No hemoptysis.  +dyspnea CARDIOVASCULAR   :  No chest pains.   GASTROINTESTINAL:  No abdominal pain.  No nausea. No vomiting.  No diarrhea. No constipation.   GENITOURINARY:     Foley in place   MUSCULOSKELETAL:  Right ankle pain/swelling and left elbow pain SKIN:                             No rashes.   ALLERGIC                 : No pruritus.  No angioedema Other:  Past Medical and Surgical History:   Past Medical History:  Diagnosis Date  . Arthritis   . Dementia 09/16/2016  . Gout    usually knees, occasionally feet  . Hearing aid worn   . Prostate cancer [facility])    Past Surgical History:  Procedure Laterality Date  . COLONOSCOPY    . HEMORROIDECTOMY    . PROSTATE BIOPSY    . TONSILLECTOMY AND ADENOIDECTOMY      Social History:   reports that he quit smoking about 58 years ago. His smoking use included Cigarettes. He has a 2.50 pack-year smoking history. He has quit using smokeless tobacco. He reports that he does not drink alcohol or use drugs.    Allergies  Allergen Reactions  . Hydrocodone Other (See Comments)    Reaction:  Hallucinations  . Indocin [Indomethacin] Other (See Comments)    Reaction:  Confusion   . Amoxicillin-Pot Clavulanate Other (See  Comments)    Reaction:  Nightmares Has patient had a PCN reaction causing immediate rash, facial/tongue/throat swelling, SOB or lightheadedness with hypotension: No Has patient had a PCN reaction causing severe rash involving mucus membranes or skin necrosis: No Has patient had a PCN reaction that required hospitalization: No Has patient had a PCN reaction occurring within the last 10 years: Yes If all of the above answers are "NO", then may proceed with Cephalosporin use.  . Erythromycin Ethylsuccinate Other (See Comments)    Reaction:  Eye irritation     Family History  Problem Relation Age of Onset  . Diverticulitis Father   . Depression Brother   . Cancer Paternal Uncle   . Colon cancer Neg Hx       Prior to Admission medications   Medication Sig Start Date End Date Taking? Authorizing Provider  acetaminophen (TYLENOL) 500 MG tablet Take 500 mg by mouth every 6 (six) hours as needed for mild pain.    Yes [provider]  albuterol (PROVENTIL) (2.5 MG/3ML) 0.083% nebulizer solution Take 3 mLs (2.5 mg total) by nebulization every  6 (six) hours as needed for wheezing or shortness of breath. 10/30/16  Yes Georgette Shell, MD  cefTRIAXone 1 g in dextrose 5 % 50 mL Inject 1 g into the vein daily.   Yes [provider]  colchicine 0.6 MG tablet Take 1 tablet (0.6 mg total) by mouth daily. 10/31/16  Yes Georgette Shell, MD  Memantine HCl-Donepezil HCl [facility]) 28-10 MG CP24 Take 28 mg by mouth daily. 02/27/16  Yes Lucille Passy, MD  QUEtiapine (SEROQUEL) 25 MG tablet Take 0.5 tablets (12.5 mg total) by mouth at bedtime. 11/02/16  Yes Dessa Phi Chahn-Yang, DO  sennosides-docusate sodium (SENOKOT-S) 8.6-50 MG tablet Take 1 tablet by mouth daily.   Yes [provider]  tamsulosin (FLOMAX) 0.4 MG CAPS capsule Take 1 capsule (0.4 mg total) by mouth daily after supper. 09/18/16  Yes Gherghe, Vella Redhead, MD  traMADol (ULTRAM) 50 MG tablet Take 50 mg by mouth every  8 (eight) hours as needed (pain).   Yes [provider]  colchicine 0.6 MG tablet Take 1 tablet (0.6 mg total) by mouth daily. When having a gout attack, take 2 tablets in day 1 then daily for 6 more days Patient not taking: Reported on 11/09/2016 09/20/16 09/20/17  Caren Griffins, MD    Physical Exam: BP 132/84 (BP Location: Right Arm)   Pulse 82   Temp (S) (!) 102.5 F (39.2 C) (Rectal)   Resp 16   Ht 5\' 9"  (1.753 m)   Wt 82.6 kg (182 lb)   SpO2 96%   BMI 26.88 kg/m   GENERAL :   Alert and cooperative, and appears to be in no acute distress. HEAD:           normocephalic. EYES:            PERRL, EOMI.   EARS:           Hard hearing  NECK:          supple CARDIAC:    Normal S1 and S2. No gallop. No murmurs.  LUNGS:       Clear to auscultation  ABDOMEN: Positive bowel sounds. Soft, nondistended, nontender.  MSK:            Right ankle swelling/edema/warm Neuro        : Alert, oriented to self/family, very sleepy, poorly communicative  SKIN:            No rash. No lesions.          Labs on Admission:  Reviewed.   Radiological Exams on Admission: Dg Chest 2 View  Result Date: 11/09/2016 CLINICAL DATA:  Fever, UTI EXAM: CHEST  2 VIEW COMPARISON:  10/24/2016 FINDINGS: Low lung volumes. Heart is normal size. Patchy airspace opacities in the right upper lobe and throughout the left lung. These are similar to prior study the slightly more pronounced. Cannot exclude multifocal pneumonia. No effusions. IMPRESSION: Patchy bilateral airspace disease, worsening since prior study. Cannot exclude multifocal pneumonia. Electronically Signed   By: Rolm Baptise M.D.   On: 11/09/2016 07:36      Assessment/Plan  HCAP: Started on van/azteronam per pharmacy dosing May transition to fluorquinolone upon discharge  Sent for sputum cx  UTI: pt was being treated for UTI with 3 courses of different abx, last was ceftriaxone, but UA is clean today, continues to have a foley due to hx of  prostate cancer.   Acute gout flare:  More likely than not, will consult ortho for  arthrocentesis to r/o septic joint and confirm dx Will get xray for right ankle and left elbow due to pain on exam Will continue colchicine and start prednisone at 30 mg daily  Hypokalemia: given 40 meq in the ED. Will monitor in am  Dementia: cont home meds   Input & Output: NA Lines & Tubes: PIV DVT prophylaxis: [place] hep GI prophylaxis: NA Consultants: Ortho  Code Status: DNR/DNI Family Communication: at bedside   Disposition Plan: TBD     Gennaro Africa M.D [facility]

## 2016-11-09 NOTE — ED Triage Notes (Signed)
Brought in by EMS from [facility] with c/o AMS and fever.  Pt on chronic foley catheter---- had recently completed a course of antibiotics for UTI two weeks ago.

## 2016-11-09 NOTE — ED Notes (Signed)
Bed: QV95 Expected date:  Expected time:  Means of arrival:  Comments: 76 yo M/ POSS SEPSIS

## 2016-11-09 NOTE — Progress Notes (Signed)
A consult was received from an ED physician for vanc/azactam per pharmacy dosing.  The patient's profile has been reviewed for ht/wt/allergies/indication/available labs.   A one time order has been placed for vanc 1g and azactam 1g x 1.  Further antibiotics/pharmacy consults should be ordered by admitting physician if indicated.                       Thank you,  Adrian Saran, PharmD, BCPS Pager [phone] 11/09/2016 8:05 AM

## 2016-11-09 NOTE — Clinical Social Work Note (Signed)
Clinical Social Work Assessment  Patient Details  Name: Ricky Boyd MRN: [id number] Date of Birth: Jun 28, 1940  Date of referral:  11/09/16               Reason for consult:   (admitted from facility)                Permission sought to share information with:  Family Supports Permission granted to share information::     Name::     daughter [name], son Programmer, multimedia::     Relationship::     Contact Information:     Housing/Transportation Living arrangements for the past 2 months:  [facility] [place] (SNF for past week) Source of Information:  Adult Children, Facility Patient Interpreter Needed:  None Criminal Activity/Legal Involvement Pertinent to Current Situation/Hospitalization:  No - Comment as needed Significant Relationships:  Adult Children, Warehouse manager Lives with:  Self Do you feel safe going back to the place where you live?  Yes Need for family participation in patient care:  Yes (Comment) (pt with dementia, adult children are decision makers in pt's care)  Care giving concerns:  Pt admitted from SNF- was DC'd to SNF for ST rehab from [place] 11/02/16. Prior to that was at home where he lives alone, but was also in SNF for rehab once before over the summer. Pt has dementia and is hard of hearing. Daughter and son are POA and assist to make decisions for him.    Social Worker assessment / plan:  CSW consulted as pt is admitted from facility- [facility], where he was for ST rehab since transfer there from [place] 11/03/15. Daughter reports plan to return to continue rehab there at [place] if appropriate.  CSW contacted SNF to inform them pt admitted to [facility] and to confirm plan to return at [place].  Pt will need updated FL2.   Employment status:  Retired Nurse, adult PT Recommendations:  Not assessed at this time [place] / Referral to community resources:  [place]  Patient/Family's Response to care:  Appropriate response, appreciative  Patient/Family's Understanding of and Emotional Response to Diagnosis, Current Treatment, and Prognosis:  Pt unable to demonstrate understanding (dementia, confusion), however daughter demonstrates adequate understanding and appropriate emotional response. Expresses enthusiasm that pt will return to [facility] to complete rehab at [place].   Emotional Assessment Appearance:  Appears stated age Attitude/Demeanor/Rapport:   (calm) Affect (typically observed):  Calm Orientation:  Oriented to Self Alcohol / Substance use:  Not Applicable Psych involvement (Current and /or in the community):  No (Comment)  Discharge Needs  Concerns to be addressed:  Discharge Planning Concerns Readmission within the last 30 days:  Yes Current discharge risk:  None Barriers to Discharge:  Continued Medical Work up   [employer], LCSW 11/09/2016, 1:38 PM  Coverage for [phone]

## 2016-11-09 NOTE — Progress Notes (Signed)
Pharmacy Antibiotic Note  Ricky Boyd is a 76 y.o. male admitted on 11/09/2016 with pneumonia.  Pharmacy has been consulted for vanc/cefepime dosing.  Plan: Vancomycin 1g IV every 12 hours.  Goal trough 15-20 mcg/mL. Cefepime 1g IV q8  Height: 5\' 9"  (175.3 cm) Weight: 182 lb (82.6 kg) IBW/kg (Calculated) : 70.7  Temp (24hrs), Avg:102.5 F (39.2 C), Min:102.5 F (39.2 C), Max:102.5 F (39.2 C)   Recent Labs Lab 11/09/16 0626 11/09/16 0637 11/09/16 0747  WBC 5.4  --   --   CREATININE 0.94  --   --   LATICACIDVEN  --  0.88 1.19    Estimated Creatinine Clearance: 64.8 mL/min (by C-G formula based on SCr of 0.94 mg/dL).    Allergies  Allergen Reactions  . Hydrocodone Other (See Comments)    Reaction:  Hallucinations  . Indocin [Indomethacin] Other (See Comments)    Reaction:  Confusion   . Amoxicillin-Pot Clavulanate Other (See Comments)    Reaction:  Nightmares Has patient had a PCN reaction causing immediate rash, facial/tongue/throat swelling, SOB or lightheadedness with hypotension: No Has patient had a PCN reaction causing severe rash involving mucus membranes or skin necrosis: No Has patient had a PCN reaction that required hospitalization: No Has patient had a PCN reaction occurring within the last 10 years: Yes If all of the above answers are "NO", then may proceed with Cephalosporin use.  . Erythromycin Ethylsuccinate Other (See Comments)    Reaction:  Eye irritation      Thank you for allowing pharmacy to be a part of this patient's care.   Adrian Saran, PharmD, BCPS Pager [phone] 11/09/2016 9:23 AM

## 2016-11-09 NOTE — ED Provider Notes (Signed)
[facility] DEPT Provider Note   CSN: [id number] Arrival date & time: 11/09/16  0607     History   Chief Complaint Chief Complaint  Patient presents with  . Altered Mental Status  . Fever    HPI Ricky Boyd is a 76 y.o. male.  76 year old male with history of dementia and recently treated for urosepsispresents with fever 48 hours. Patient just completed antibiotics for his UTI. No reported cough or congestion. No reported vomiting or diarrhea. Fever has been treated with antipyretics. Patient's mental status has decreased. EMS called and patient transported here      Past Medical History:  Diagnosis Date  . Arthritis   . Dementia 09/16/2016  . Gout    usually knees, occasionally feet  . Hearing aid worn   . Prostate cancer [facility])     Patient Active Problem List   Diagnosis Date Noted  . Normocytic anemia 10/28/2016  . Enterococcal bacteremia 10/28/2016  . Bacteremia due to Enterobacter species 10/28/2016  . Sepsis secondary to UTI ([place]) 10/25/2016  . Hyperglycemia 10/25/2016  . Thrombocytopenia ([place]) 10/25/2016  . Hematuria 10/24/2016  . Urinary retention 10/08/2016  . Acute encephalopathy 09/16/2016  . Dementia 09/16/2016  . Urinary tract infection without hematuria   . Dehydration   . Malignant neoplasm of prostate ([place]) 04/23/2016  . Erectile dysfunction 07/23/2015  . 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 Elevated PSA, less than 10 ng/ml 06/27/2012  . GOUT 12/26/2008  . 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 History of colonic polyps 10/28/2006  . 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 COLONOSCOPY    . HEMORROIDECTOMY    . PROSTATE BIOPSY    . TONSILLECTOMY AND ADENOIDECTOMY         Home Medications    Prior to Admission medications   Medication Sig Start Date End Date Taking? Authorizing Provider  acetaminophen (TYLENOL) 500 MG tablet Take 1,000 mg by mouth every  6 (six) hours as needed for mild pain.    [provider]  albuterol (PROVENTIL) (2.5 MG/3ML) 0.083% nebulizer solution Take 3 mLs (2.5 mg total) by nebulization every 6 (six) hours as needed for wheezing or shortness of breath. 10/30/16   Georgette Shell, MD  colchicine 0.6 MG tablet Take 1 tablet (0.6 mg total) by mouth daily. When having a gout attack, take 2 tablets in day 1 then daily for 6 more days 09/20/16 09/20/17  Caren Griffins, MD  colchicine 0.6 MG tablet Take 1 tablet (0.6 mg total) by mouth daily. 10/31/16   Georgette Shell, MD  Memantine HCl-Donepezil HCl [facility]) 28-10 MG CP24 Take 28 mg by mouth daily. 02/27/16   Lucille Passy, MD  QUEtiapine (SEROQUEL) 25 MG tablet Take 0.5 tablets (12.5 mg total) by mouth at bedtime. 11/02/16   Dessa Phi Chahn-Yang, DO  tamsulosin (FLOMAX) 0.4 MG CAPS capsule Take 1 capsule (0.4 mg total) by mouth daily after supper. 09/18/16   Caren Griffins, MD    Family History Family History  Problem Relation Age of Onset  . Diverticulitis Father   . Depression Brother   . Cancer Paternal Uncle   . Colon cancer Neg Hx     Social History Social History  Substance Use Topics  . Smoking status: Former Smoker    Packs/day: 0.25    Years: 10.00    Types: Cigarettes    Quit date: 02/23/1958  . Smokeless tobacco: Former  User     Comment: quit 1963  . Alcohol use No     Comment: h/o occasional use     Allergies   Hydrocodone; Indocin [indomethacin]; Amoxicillin-pot clavulanate; and Erythromycin ethylsuccinate   Review of Systems Review of Systems  Unable to perform ROS: Dementia     Physical Exam Updated Vital Signs BP (!) 159/86   Pulse 81   Temp (S) (!) 102.5 F (39.2 C) (Rectal)   Resp 11   Ht 1.753 m (5\' 9" )   Wt 82.6 kg (182 lb)   SpO2 95%   BMI 26.88 kg/m   Physical Exam  Constitutional: He is oriented to person, place, and time. He appears well-developed and well-nourished.  Non-toxic appearance. No  distress.  HENT:  Head: Normocephalic and atraumatic.  Eyes: Pupils are equal, round, and reactive to light. Conjunctivae, EOM and lids are normal.  Neck: Normal range of motion. Neck supple. No tracheal deviation present. No thyroid mass present.  Cardiovascular: Normal rate, regular rhythm and normal heart sounds.  Exam reveals no gallop.   No murmur heard. Pulmonary/Chest: Effort normal and breath sounds normal. No stridor. No respiratory distress. He has no decreased breath sounds. He has no wheezes. He has no rhonchi. He has no rales.  Abdominal: Soft. Normal appearance and bowel sounds are normal. He exhibits no distension. There is no tenderness. There is no rebound and no CVA tenderness.  Musculoskeletal: Normal range of motion. He exhibits no edema or tenderness.  Neurological: He is alert and oriented to person, place, and time. No cranial nerve deficit or sensory deficit. GCS eye subscore is 3. GCS verbal subscore is 4. GCS motor subscore is 5.  Skin: Skin is warm and dry. No abrasion and no rash noted.  Psychiatric: His affect is blunt. He is withdrawn. He is noncommunicative.  Nursing note and vitals reviewed.    ED Treatments / Results  Labs (all labs ordered are listed, but only abnormal results are displayed) Labs Reviewed  COMPREHENSIVE METABOLIC PANEL - Abnormal; Notable for the following:       Result Value   Potassium 3.1 (*)    Chloride 98 (*)    Glucose, Bld 112 (*)    Calcium 8.3 (*)    Albumin 3.0 (*)    All other components within normal limits  CBC WITH DIFFERENTIAL/PLATELET - Abnormal; Notable for the following:    RBC 3.34 (*)    Hemoglobin 10.5 (*)    HCT 30.8 (*)    Platelets 125 (*)    All other components within normal limits  CULTURE, BLOOD (ROUTINE X 2)  CULTURE, BLOOD (ROUTINE X 2)  URINE CULTURE  URINALYSIS, ROUTINE W REFLEX MICROSCOPIC  I-STAT CG4 LACTIC ACID, ED  I-STAT CG4 LACTIC ACID, ED    EKG  EKG Interpretation None        Radiology No results found.  Procedures Procedures (including critical care time)  Medications Ordered in ED Medications  sodium chloride 0.9 % bolus 1,000 mL (1,000 mLs Intravenous New Bag/Given 11/09/16 0700)  sodium chloride 0.9 % bolus 1,000 mL (1,000 mLs Intravenous New Bag/Given 11/09/16 0700)  sodium chloride 0.9 % bolus 500 mL (500 mLs Intravenous New Bag/Given 11/09/16 0700)     Initial Impression / Assessment and Plan / ED Course  I have reviewed the triage vital signs and the nursing notes.  Pertinent labs & imaging results that were available during my care of the patient were reviewed by me and considered in my  medical decision making (see chart for details).     Patient started on antibiotics for presumed sepsis. Will be admitted to the hospitalist service  Final Clinical Impressions(s) / ED Diagnoses   Final diagnoses:  None    New Prescriptions New Prescriptions   No medications on file     Lacretia Leigh, MD 11/11/16 1541

## 2016-11-09 NOTE — Consult Note (Signed)
Reason for Consult:Right ankle pain Referring Physician: A Hamad  Ricky Boyd is an 76 y.o. male.  HPI: Ricky Boyd was admitted today for PNA. During exam he was noted to have right ankle swelling, redness, warmth and orthopedic surgery was consulted for joint aspiration to confirm dx of gout. Pt is demented and cannot contribute reliably to history which was taken from family. They note some perceived RLE soreness over the last couple of days. 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 He is on colchicine chronically for this.  Past Medical History:  Diagnosis Date  . Arthritis   . Dementia 09/16/2016  . Gout    usually knees, occasionally feet  . Hearing aid worn   . Prostate cancer [facility])     Past Surgical History:  Procedure Laterality Date  . COLONOSCOPY    . HEMORROIDECTOMY    . PROSTATE BIOPSY    . TONSILLECTOMY AND ADENOIDECTOMY      Family History  Problem Relation Age of Onset  . Diverticulitis Father   . Depression Brother   . Cancer Paternal Uncle   . Colon cancer Neg Hx     Social History:  reports that he quit smoking about 58 years ago. His smoking use included Cigarettes. He has a 2.50 pack-year smoking history. He has quit using smokeless tobacco. He reports that he does not drink alcohol or use drugs.  Allergies:  Allergies  Allergen Reactions  . Hydrocodone Other (See Comments)    Reaction:  Hallucinations  . Indocin [Indomethacin] Other (See Comments)    Reaction:  Confusion   . Amoxicillin-Pot Clavulanate Other (See Comments)    Reaction:  Nightmares Has patient had a PCN reaction causing immediate rash, facial/tongue/throat swelling, SOB or lightheadedness with hypotension: No Has patient had a PCN reaction causing severe rash involving mucus membranes or skin necrosis: No Has patient had a PCN reaction that required hospitalization: No Has patient had a PCN reaction occurring within the last 10 years: Yes If all of  the above answers are "NO", then may proceed with Cephalosporin use.  . Erythromycin Ethylsuccinate Other (See Comments)    Reaction:  Eye irritation     Medications: I have reviewed the patient's current medications.  Results for orders placed or performed during the hospital encounter of 11/09/16 (from the past 48 hour(s))  Comprehensive metabolic panel     Status: Abnormal   Collection Time: 11/09/16  6:26 AM  Result Value Ref Range   Sodium 137 135 - 145 mmol/L   Potassium 3.1 (L) 3.5 - 5.1 mmol/L   Chloride 98 (L) 101 - 111 mmol/L   CO2 28 22 - 32 mmol/L   Glucose, Bld 112 (H) 65 - 99 mg/dL   BUN 9 6 - 20 mg/dL   Creatinine, Ser 0.94 0.61 - 1.24 mg/dL   Calcium 8.3 (L) 8.9 - 10.3 mg/dL   Total Protein 6.5 6.5 - 8.1 g/dL   Albumin 3.0 (L) 3.5 - 5.0 g/dL   AST 20 15 - 41 U/L   ALT 21 17 - 63 U/L   Alkaline Phosphatase 54 38 - 126 U/L   Total Bilirubin 0.9 0.3 - 1.2 mg/dL   GFR calc non Af Amer >60 >60 mL/min   GFR calc Af Amer >60 >60 mL/min    Comment: (NOTE) The eGFR has been calculated using the CKD EPI equation. This calculation has not been validated in all clinical situations. eGFR's persistently <60 mL/min  signify possible Chronic Kidney Disease.    Anion gap 11 5 - 15  CBC with Differential     Status: Abnormal   Collection Time: 11/09/16  6:26 AM  Result Value Ref Range   WBC 5.4 4.0 - 10.5 K/uL   RBC 3.34 (L) 4.22 - 5.81 MIL/uL   Hemoglobin 10.5 (L) 13.0 - 17.0 g/dL   HCT 30.8 (L) 39.0 - 52.0 %   MCV 92.2 78.0 - 100.0 fL   MCH 31.4 26.0 - 34.0 pg   MCHC 34.1 30.0 - 36.0 g/dL   RDW 15.2 11.5 - 15.5 %   Platelets 125 (L) 150 - 400 K/uL   Neutrophils Relative % 80 %   Neutro Abs 4.3 1.7 - 7.7 K/uL   Lymphocytes Relative 14 %   Lymphs Abs 0.8 0.7 - 4.0 K/uL   Monocytes Relative 5 %   Monocytes Absolute 0.3 0.1 - 1.0 K/uL   Eosinophils Relative 1 %   Eosinophils Absolute 0.1 0.0 - 0.7 K/uL   Basophils Relative 0 %   Basophils Absolute 0.0 0.0 - 0.1 K/uL   I-Stat CG4 Lactic Acid, ED     Status: None   Collection Time: 11/09/16  6:37 AM  Result Value Ref Range   Lactic Acid, Venous 0.88 0.5 - 1.9 mmol/L  Urinalysis, Routine w reflex microscopic     Status: Abnormal   Collection Time: 11/09/16  7:05 AM  Result Value Ref Range   Color, Urine YELLOW YELLOW   APPearance CLEAR CLEAR   Specific Gravity, Urine 1.011 1.005 - 1.030   pH 8.0 5.0 - 8.0   Glucose, UA NEGATIVE NEGATIVE mg/dL   Hgb urine dipstick SMALL (A) NEGATIVE   Bilirubin Urine NEGATIVE NEGATIVE   Ketones, ur NEGATIVE NEGATIVE mg/dL   Protein, ur NEGATIVE NEGATIVE mg/dL   Nitrite NEGATIVE NEGATIVE   Leukocytes, UA NEGATIVE NEGATIVE   RBC / HPF 0-5 0 - 5 RBC/hpf   WBC, UA 0-5 0 - 5 WBC/hpf   Bacteria, UA NONE SEEN NONE SEEN   Squamous Epithelial / LPF NONE SEEN NONE SEEN  I-Stat CG4 Lactic Acid, ED  (not at  [facility])     Status: None   Collection Time: 11/09/16  7:47 AM  Result Value Ref Range   Lactic Acid, Venous 1.19 0.5 - 1.9 mmol/L    Dg Chest 2 View  Result Date: 11/09/2016 CLINICAL DATA:  Fever, UTI EXAM: CHEST  2 VIEW COMPARISON:  10/24/2016 FINDINGS: Low lung volumes. Heart is normal size. Patchy airspace opacities in the right upper lobe and throughout the left lung. These are similar to prior study the slightly more pronounced. Cannot exclude multifocal pneumonia. No effusions. IMPRESSION: Patchy bilateral airspace disease, worsening since prior study. Cannot exclude multifocal pneumonia. Electronically Signed   By: Rolm Baptise M.D.   On: 11/09/2016 07:36   Dg Elbow Complete Left (3+view)  Result Date: 11/09/2016 CLINICAL DATA:  Left elbow pain when moved, unknown if fall, ams, pt unable to give hx, uti, fever EXAM: LEFT ELBOW - COMPLETE 3+ VIEW COMPARISON:  MR 07/28/2004 FINDINGS: There is no evidence of fracture, dislocation. Small effusion. There is no evidence of arthropathy or other focal bone abnormality. Soft tissues are unremarkable. IMPRESSION: 1. Elbow  effusion without fracture or other bone abnormality. Electronically Signed   By: Lucrezia Europe M.D.   On: 11/09/2016 10:16    Review of Systems  Unable to perform ROS: Dementia   Blood pressure 104/73, pulse 74, temperature 98.9  F (37.2 C), temperature source Oral, resp. rate 16, height 5' 9" (1.753 m), weight 82.6 kg (182 lb), SpO2 94 %. Physical Exam  Constitutional: He appears well-developed and well-nourished. No distress.  HENT:  Head: Normocephalic.  Eyes: Conjunctivae are normal. Right eye exhibits no discharge. Left eye exhibits no discharge. No scleral icterus.  Cardiovascular: Normal rate and regular rhythm.   Respiratory: Effort normal. No respiratory distress.  Musculoskeletal:  RLE No traumatic wounds, ecchymosis, or rash  Moderate TTP knee, severe TTP ankle, 1st MTP joint  Knee stable to varus/ valgus and anterior/posterior stress  Sens DPN, SPN, TN could not assess  Motor EHL, ext, flex, evers grossly intact  DP 2+, PT 1+, 3+ pitting edema  LLE No traumatic wounds, ecchymosis, or rash  Nontender  No knee or ankle effusion  Knee stable to varus/ valgus and anterior/posterior stress  Sens DPN, SPN, TN could not assess  Motor EHL, ext, flex, evers grossly intact  DP 2+, PT 1+, 3+ pitting edema  Neurological: He is alert.  Skin: Skin is warm and dry. He is not diaphoretic.  Psychiatric: His behavior is normal. His affect is blunt.    Assessment/Plan: Gout -- Given MTP joint redness, warmth, and pain in addition to ipsilateral ankle and knee involvement as well as normal WBC I feel confident that this is gout. I explained to family that ankle aspiration/injection would only treat the ankle symptoms and they did not want to put him through that for the limited benefit. Will increase colchicine to 0.56m tid; this can be decreased to prophylactic dose once symptoms resolve. Would consider Naproxen 505mbid unless contraindicated. Agree with prednisone, suggest 14d taper once  symptoms resolve to decrease chance of rebound. I told family if symptoms did not resolve or worsened we could always come back and do aspiration. Please call if this is desired.    MiLisette AbuPA-C Orthopedic Surgery [phone]/17/2018, 11:30 AM

## 2016-11-09 NOTE — ED Notes (Signed)
MD at bedside. 

## 2016-11-10 DIAGNOSIS — M109 Gout, unspecified: Secondary | ICD-10-CM

## 2016-11-10 LAB — COMPREHENSIVE METABOLIC PANEL
ALBUMIN: 2.8 g/dL — AB (ref 3.5–5.0)
ALK PHOS: 52 U/L (ref 38–126)
ALT: 20 U/L (ref 17–63)
ANION GAP: 6 (ref 5–15)
AST: 22 U/L (ref 15–41)
BUN: 7 mg/dL (ref 6–20)
CALCIUM: 7.9 mg/dL — AB (ref 8.9–10.3)
CO2: 29 mmol/L (ref 22–32)
Chloride: 103 mmol/L (ref 101–111)
Creatinine, Ser: 0.9 mg/dL (ref 0.61–1.24)
GFR calc non Af Amer: >60 mL/min (ref >60)
GLUCOSE: 108 mg/dL — AB (ref 65–99)
POTASSIUM: 3.2 mmol/L — AB (ref 3.5–5.1)
SODIUM: 138 mmol/L (ref 135–145)
TOTAL PROTEIN: 5.8 g/dL — AB (ref 6.5–8.1)
Total Bilirubin: 0.5 mg/dL (ref 0.3–1.2)

## 2016-11-10 LAB — CBC
HEMATOCRIT: 27.6 % — AB (ref 39.0–52.0)
HEMOGLOBIN: 9.6 g/dL — AB (ref 13.0–17.0)
MCH: 31.8 pg (ref 26.0–34.0)
MCHC: 34.8 g/dL (ref 30.0–36.0)
MCV: 91.4 fL (ref 78.0–100.0)
Platelets: 114 10*3/uL — ABNORMAL LOW (ref 150–400)
RBC: 3.02 MIL/uL — AB (ref 4.22–5.81)
RDW: 14.7 % (ref 11.5–15.5)
WBC: 3.6 10*3/uL — ABNORMAL LOW (ref 4.0–10.5)

## 2016-11-10 LAB — URINE CULTURE: Culture: NO GROWTH

## 2016-11-10 LAB — MRSA PCR SCREENING: MRSA BY PCR: NEGATIVE

## 2016-11-10 MED ORDER — ACETAMINOPHEN 325 MG PO TABS
650.0000 mg | ORAL_TABLET | Freq: Four times a day (QID) | ORAL | Status: DC | PRN
  Administered 2016-11-10: 650 mg via ORAL
  Filled 2016-11-10: qty 2

## 2016-11-10 MED ORDER — SODIUM CHLORIDE 0.9 % IV BOLUS (SEPSIS)
500.0000 mL | Freq: Once | INTRAVENOUS | Status: AC
  Administered 2016-11-10: 500 mL via INTRAVENOUS

## 2016-11-10 NOTE — Progress Notes (Addendum)
CSW following to assist w/ discharge needs. CSW contacted patient son and discussed d/c plan- back to SNF for short term rehab if pt. Is well enough at the time. CSW updated [facility] Admissions.    Kathrin Greathouse, Latanya Presser, MSW Clinical Social Worker 5E and Psychiatric Service Line [phone] 11/10/2016  11:58 AM

## 2016-11-10 NOTE — Progress Notes (Signed)
[facility] Progress Note  Patient: Ricky Boyd HYW:[id number]   PCP: Lucille Passy, MD DOB: Jul 11, 1940   DOA: 11/09/2016   DOS: 11/10/2016   Date of Service: the patient was seen and examined on 11/10/2016  Subjective: ROS limited due to dementia. No nausea no vomiting. Awake and eating.  Brief hospital course: Pt. with PMH of prostate cancer, dementia; admitted on 11/09/2016, presented with complaint of fatigue and fever and chills, was found to have gout. Currently further plan is continue IV antibiotics.  Assessment and Plan: 1. Suspected healthcare associated pneumonia. Patient presented with complaints of fatigue. Chest x-ray made a comment about cannot rule out pneumonia due to patchy infiltrate. Patient does not have any leukocytosis. We will check pro-calcitonin level. Blood cultures negative for 24 hours. Will monitor for culture clearance as the patient did have some fever this morning as well. Continue with IV cefepime. Patient's MRSA PCR is negative therefore would discontinue IV vancomycin.  2. Gout. Multiple joints affected. Primary cause for patient's fever. Orthopedics consulted. A no indication for arthrocentesis. Patient on colchicine and prednisone and getting better.  3. Dementia. Generalized deconditioning. Patient was recently transferred to skilled nursing facility. Once stable plan is to go back to skilled nursing facility.  4. Suspected UTI-ruled out. Prostate cancer. 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 Patient's urologist is at [facility]. Urine culture this admission is negative for any acute growth. Do not suspect that the patient actually has an active UTI. Daughter requested the patient to be transferred to [facility] for urologic workup. Explained that the patient appears to be clinically getting better, do not have yet to UTI and may very well  likely be stable to be discharged tomorrow pending clearance from the culture. Thus may not be a candidate for inpatient hospital to hospital transfer. Also [facility] currently have waiting list for med surge transfers. Recommend family to continue pursuing outpatient follow-up.  Diet: cardiac diet DVT Prophylaxis: subcutaneous Heparin  Advance goals of care discussion: DNR DNI  Family Communication: family was present at bedside, at the time of interview. The pt provided permission to discuss medical plan with the family. Opportunity was given to ask question and all questions were answered satisfactorily.   Disposition:  Discharge to SNF.  Consultants: none Procedures: none  Antibiotics: Anti-infectives    Start     Dose/Rate Route Frequency Ordered Stop   11/09/16 2200  vancomycin (VANCOCIN) IVPB 1000 mg/200 mL premix  Status:  Discontinued     1,000 mg 200 mL/hr over 60 Minutes Intravenous Every 12 hours 11/09/16 0924 11/10/16 1757   11/09/16 1800  ceFEPIme (MAXIPIME) 1 g in dextrose 5 % 50 mL IVPB     1 g 100 mL/hr over 30 Minutes Intravenous Every 8 hours 11/09/16 0924     11/09/16 0815  aztreonam (AZACTAM) 1 GM IVPB     1 g 100 mL/hr over 30 Minutes Intravenous  Once 11/09/16 0800 11/09/16 1128   11/09/16 0815  vancomycin (VANCOCIN) IVPB 1000 mg/200 mL premix     1,000 mg 200 mL/hr over 60 Minutes Intravenous  Once 11/09/16 0800 11/09/16 1227       Objective: Physical Exam: Vitals:   11/10/16 0728 11/10/16 1130 11/10/16 1437 11/10/16 1500  BP:    123/74  Pulse:    72  Resp:    18  Temp: (!) 101.4 F (38.6 C) 100.3 F (37.9 C)  97.8 F (36.6 C) 98.2 F (36.8 C)  TempSrc: Axillary Oral Oral Oral  SpO2:    94%  Weight:      Height:        Intake/Output Summary (Last 24 hours) at 11/10/16 1857 Last data filed at 11/10/16 1800  Gross per 24 hour  Intake             2400 ml  Output             4700 ml  Net            -2300 ml   Filed Weights   11/09/16 0622    Weight: 82.6 kg (182 lb)   General: Alert, Awake and Oriented to Person. Appear in no distress, affect normal Eyes: PERRL, Conjunctiva normal ENT: Oral Mucosa clear moist. Neck: no JVD, no Abnormal Mass Or lumps Cardiovascular: S1 and S2 Present, no Murmur, Peripheral Pulses Present Respiratory: normal respiratory effort, Bilateral Air entry equal and Decreased, no use of accessory muscle, Clear to Auscultation, no Crackles, no wheezes Abdomen: Bowel Sound present, Soft and no tenderness, no hernia Skin: no redness, no Rash, no induration Extremities: right ankle edema, no Pedal edema, no calf tenderness Neurologic: Grossly no focal neuro deficit. Bilaterally Equal motor strength  Data Reviewed: CBC:  Recent Labs Lab 11/09/16 0626 11/10/16 0651  WBC 5.4 3.6*  NEUTROABS 4.3  --   HGB 10.5* 9.6*  HCT 30.8* 27.6*  MCV 92.2 91.4  PLT 125* 030*   Basic Metabolic Panel:  Recent Labs Lab 11/09/16 0626 11/10/16 0651  NA 137 138  K 3.1* 3.2*  CL 98* 103  CO2 28 29  GLUCOSE 112* 108*  BUN 9 7  CREATININE 0.94 0.90  CALCIUM 8.3* 7.9*    Liver Function Tests:  Recent Labs Lab 11/09/16 0626 11/10/16 0651  AST 20 22  ALT 21 20  ALKPHOS 54 52  BILITOT 0.9 0.5  PROT 6.5 5.8*  ALBUMIN 3.0* 2.8*   No results for input(s): LIPASE, AMYLASE in the last 168 hours. No results for input(s): AMMONIA in the last 168 hours. Coagulation Profile: No results for input(s): INR, PROTIME in the last 168 hours. Cardiac Enzymes: No results for input(s): CKTOTAL, CKMB, CKMBINDEX, TROPONINI in the last 168 hours. BNP (last 3 results) No results for input(s): PROBNP in the last 8760 hours. CBG: No results for input(s): GLUCAP in the last 168 hours. Studies: No results found.  Scheduled Meds: . colchicine  0.6 mg Oral TID  . donepezil  10 mg Oral Daily   And  . memantine  28 mg Oral Daily  . heparin  5,000 Units Subcutaneous Q8H  . predniSONE  30 mg Oral Q breakfast  .  QUEtiapine  12.5 mg Oral QHS  . senna-docusate  1 tablet Oral Daily  . tamsulosin  0.4 mg Oral QPC supper   Continuous Infusions: . sodium chloride 75 mL/hr at 11/10/16 1730  . ceFEPime (MAXIPIME) IV Stopped (11/10/16 1758)   PRN Meds: acetaminophen, albuterol, traMADol  Time spent: 35 minutes  Author: Berle Mull, MD [facility] Pager: [phone] 11/10/2016 6:57 PM  If 7PM-7AM, please contact night-coverage at [email or web address], password [facility]

## 2016-11-10 NOTE — Care Management Note (Signed)
Case Management Note  Patient Details  Name: Ricky Boyd MRN: [id number] Date of Birth: [birth year]  Subjective/Objective:                  Sepsis with ams  Action/Plan: Date:  November 10, 2016 Chart reviewed for concurrent status and case management needs. Will continue to follow patient progress. Discharge Planning: from [facility] assisted living Expected discharge date: [id number] Velva Harman, BSN, [place], [place]  Expected Discharge Date:   (UNKNOWN)               Expected Discharge Plan:  Assisted Living / Rest Home  In-House Referral:  Clinical Social Work  Discharge planning Services  CM Consult  Post Acute Care Choice:    Choice offered to:     DME Arranged:    DME Agency:     HH Arranged:    [place] Agency:     Status of Service:  In process, will continue to follow  If discussed at Long Length of Stay Meetings, dates discussed:    Additional Comments:  Leeroy Cha, RN 11/10/2016, 8:47 AM

## 2016-11-11 ENCOUNTER — Encounter (HOSPITAL_COMMUNITY): Payer: Self-pay | Admitting: Radiology

## 2016-11-11 ENCOUNTER — Inpatient Hospital Stay (HOSPITAL_COMMUNITY): Payer: Medicare Other

## 2016-11-11 DIAGNOSIS — F0150 Vascular dementia without behavioral disturbance: Secondary | ICD-10-CM

## 2016-11-11 DIAGNOSIS — Z960 Presence of urogenital implants: Secondary | ICD-10-CM

## 2016-11-11 DIAGNOSIS — R509 Fever, unspecified: Secondary | ICD-10-CM

## 2016-11-11 DIAGNOSIS — J189 Pneumonia, unspecified organism: Principal | ICD-10-CM

## 2016-11-11 LAB — CBC
HCT: 26.1 % — ABNORMAL LOW (ref 39.0–52.0)
Hemoglobin: 8.8 g/dL — ABNORMAL LOW (ref 13.0–17.0)
MCH: 31.1 pg (ref 26.0–34.0)
MCHC: 33.7 g/dL (ref 30.0–36.0)
MCV: 92.2 fL (ref 78.0–100.0)
PLATELETS: 134 10*3/uL — AB (ref 150–400)
RBC: 2.83 MIL/uL — ABNORMAL LOW (ref 4.22–5.81)
RDW: 14.7 % (ref 11.5–15.5)
WBC: 4.4 10*3/uL (ref 4.0–10.5)

## 2016-11-11 LAB — BASIC METABOLIC PANEL
Anion gap: 6 (ref 5–15)
BUN: 7 mg/dL (ref 6–20)
CALCIUM: 8.2 mg/dL — AB (ref 8.9–10.3)
CO2: 28 mmol/L (ref 22–32)
CREATININE: 0.82 mg/dL (ref 0.61–1.24)
Chloride: 109 mmol/L (ref 101–111)
GFR calc Af Amer: >60 mL/min (ref >60)
Glucose, Bld: 115 mg/dL — ABNORMAL HIGH (ref 65–99)
Potassium: 3.5 mmol/L (ref 3.5–5.1)
SODIUM: 143 mmol/L (ref 135–145)

## 2016-11-11 LAB — MAGNESIUM: MAGNESIUM: 1.9 mg/dL (ref 1.7–2.4)

## 2016-11-11 MED ORDER — SULFAMETHOXAZOLE-TRIMETHOPRIM 800-160 MG PO TABS
1.0000 | ORAL_TABLET | Freq: Two times a day (BID) | ORAL | Status: DC
Start: 2016-11-11 — End: 2016-11-13
  Administered 2016-11-11 – 2016-11-13 (×4): 1 via ORAL
  Filled 2016-11-11 (×4): qty 1

## 2016-11-11 NOTE — Progress Notes (Signed)
PROGRESS NOTE  Taos Tapp CVE:[id number] DOB: [birth year] DOA: 11/09/2016 PCP: Lucille Passy, MD  HPI/Recap of past 24 hours:  Oriented to person only He required two person assist for transfer Personal care giver at bedside  Assessment/Plan: Active Problems:   Pneumonia  Encephalopathy:  He was sent back to hospital  From SNF due to concerns of fever and altered mental status at SNF, he completed antibiotics for uti at snf prior to sent back to the hospital. Today he is alert and talkative, but he avoid answers questions directly, personal care giver at bedside, report patient normally is very talkative and make more sense Patient was hospitalized in July and early September with altered mental status thought due to uti. He did have fever 102 on admission, he is  Treated with cefepime, fever resolved, culture unrevealing, cxr with pneumonia? Will get ct chest , changed abx to bactrim   There is also concerns of gout related fever, he is treated with steroids and colchicine. Ortho consulted, no need of arthrocentesis, will check uric acid  Pedal edema , right lower extremity: venous doppler pending  H/o prostate cancer with urinary retention with indwelling foley, last changed on 9/1.  Dementia: continue home meds  FTT: pt eval, snf placement  Code Status: DNR  Family Communication: patient and personal care giver at bedside  Disposition Plan: SNF in am   Consultants:  orthopedics  Procedures:  none  Antibiotics:  cefepime   Objective: BP 120/64 (BP Location: Left Arm)   Pulse 68   Temp 98 F (36.7 C) (Oral)   Resp 18   Ht 5\' 9"  (1.753 m)   Wt 82.6 kg (182 lb)   SpO2 96%   BMI 26.88 kg/m   Intake/Output Summary (Last 24 hours) at 11/11/16 1647 Last data filed at 11/11/16 0744  Gross per 24 hour  Intake             1455 ml  Output             2600 ml  Net            -1145 ml   Filed Weights   11/09/16 0622  Weight: 82.6 kg (182 lb)     Exam: Patient is examined daily including today on 11/11/2016, exams remain the same as of yesterday except that has changed    General:  Frail, oriented to person  Cardiovascular: RRR  Respiratory: CTABL  Abdomen: Soft/ND/NT, positive BS  Musculoskeletal: pedal Edema right foot  Neuro: alert, oriented  To person  Data Reviewed: Basic Metabolic Panel:  Recent Labs Lab 11/09/16 0626 11/10/16 0651 11/11/16 0622  NA 137 138 143  K 3.1* 3.2* 3.5  CL 98* 103 109  CO2 28 29 28   GLUCOSE 112* 108* 115*  BUN 9 7 7   CREATININE 0.94 0.90 0.82  CALCIUM 8.3* 7.9* 8.2*  MG  --   --  1.9   Liver Function Tests:  Recent Labs Lab 11/09/16 0626 11/10/16 0651  AST 20 22  ALT 21 20  ALKPHOS 54 52  BILITOT 0.9 0.5  PROT 6.5 5.8*  ALBUMIN 3.0* 2.8*   No results for input(s): LIPASE, AMYLASE in the last 168 hours. No results for input(s): AMMONIA in the last 168 hours. CBC:  Recent Labs Lab 11/09/16 0626 11/10/16 0651 11/11/16 0622  WBC 5.4 3.6* 4.4  NEUTROABS 4.3  --   --   HGB 10.5* 9.6* 8.8*  HCT 30.8* 27.6* 26.1*  MCV 92.2  91.4 92.2  PLT 125* 114* 134*   Cardiac Enzymes:   No results for input(s): CKTOTAL, CKMB, CKMBINDEX, TROPONINI in the last 168 hours. BNP (last 3 results) No results for input(s): BNP in the last 8760 hours.  ProBNP (last 3 results) No results for input(s): PROBNP in the last 8760 hours.  CBG: No results for input(s): GLUCAP in the last 168 hours.  Recent Results (from the past 240 hour(s))  Culture, blood (Routine x 2)     Status: None (Preliminary result)   Collection Time: 11/09/16  6:26 AM  Result Value Ref Range Status   Specimen Description BLOOD LEFT ARM  Final   Special Requests   Final    BOTTLES DRAWN AEROBIC AND ANAEROBIC Blood Culture adequate volume   Culture   Final    NO GROWTH 2 DAYS Performed at [facility], [address], [place], [place] [address]    Report Status PENDING  Incomplete  Culture, blood  (Routine x 2)     Status: None (Preliminary result)   Collection Time: 11/09/16  6:45 AM  Result Value Ref Range Status   Specimen Description BLOOD RIGHT FOREARM  Final   Special Requests   Final    BOTTLES DRAWN AEROBIC AND ANAEROBIC Blood Culture adequate volume   Culture   Final    NO GROWTH 2 DAYS Performed at [facility], [place] [address], [place], [place] [address]    Report Status PENDING  Incomplete  Urine culture     Status: None   Collection Time: 11/09/16  7:05 AM  Result Value Ref Range Status   Specimen Description URINE, CATHETERIZED  Final   Special Requests NONE  Final   Culture   Final    NO GROWTH Performed at [facility], [place] [address], [place], [place] [address]    Report Status 11/10/2016 FINAL  Final  MRSA PCR Screening     Status: None   Collection Time: 11/10/16 10:03 AM  Result Value Ref Range Status   MRSA by PCR NEGATIVE NEGATIVE Final    Comment:        The GeneXpert MRSA Assay (FDA approved for NASAL specimens only), is one component of a comprehensive MRSA colonization surveillance program. It is not intended to diagnose MRSA infection nor to guide or monitor treatment for MRSA infections.      Studies: Ct Chest Wo Contrast  Result Date: 11/11/2016 CLINICAL DATA:  Fever, chills. EXAM: CT CHEST WITHOUT CONTRAST TECHNIQUE: Multidetector CT imaging of the chest was performed following the standard protocol without IV contrast. COMPARISON:  Radiographs of November 09, 2016. CT scan of March 18, 2007. FINDINGS: Cardiovascular: 4.5 cm ascending thoracic aortic aneurysm is noted. Normal cardiac size. No pericardial effusion is noted. Mediastinum/Nodes: No enlarged mediastinal or axillary lymph nodes. Thyroid gland, trachea, and esophagus demonstrate no significant findings. Lungs/Pleura: No pneumothorax or pleural effusion is noted. Stable peripheral honeycombing is noted in both lung bases and upper lobes. No acute pulmonary disease is  noted. No pulmonary nodule is noted. Upper Abdomen: No acute abnormality. Musculoskeletal: No chest Mikowski mass or suspicious bone lesions identified. IMPRESSION: 4.5 cm ascending thoracic aortic aneurysm. Ascending thoracic aortic aneurysm. Recommend semi-annual imaging followup by CTA or MRA and referral to cardiothoracic surgery if not already obtained. This recommendation follows 2010 ACCF/AHA/AATS/ACR/ASA/SCA/SCAI/SIR/STS/SVM Guidelines for the Diagnosis and Management of Patients With Thoracic Aortic Disease. Circulation. 2010; 121: N235-T732. Peripheral honeycombing is noted in both upper and lower lobes  consistent with pulmonary fibrosis. No acute abnormality is noted in the chest. Electronically Signed   By: Marijo Conception, M.D.   On: 11/11/2016 16:13    Scheduled Meds: . colchicine  0.6 mg Oral TID  . donepezil  10 mg Oral Daily   And  . memantine  28 mg Oral Daily  . heparin  5,000 Units Subcutaneous Q8H  . predniSONE  30 mg Oral Q breakfast  . QUEtiapine  12.5 mg Oral QHS  . senna-docusate  1 tablet Oral Daily  . sulfamethoxazole-trimethoprim  1 tablet Oral Q12H  . tamsulosin  0.4 mg Oral QPC supper    Continuous Infusions:   Time spent: 38mins I have personally reviewed and interpreted on  11/11/2016 daily labs, tele strips, imagings as discussed above under data review session and assessment and plans.  I reviewed all nursing notes, pharmacy notes, consultant notes,  vitals, pertinent old records  I have discussed plan of care as described above with RN , patient  on 11/11/2016   Jaquil Todt MD, PhD  [facility] Pager [phone]. If 7PM-7AM, please contact night-coverage at [email or web address], password [facility] 11/11/2016, 4:47 PM  LOS: 2 days

## 2016-11-12 ENCOUNTER — Inpatient Hospital Stay (HOSPITAL_COMMUNITY): Payer: Medicare Other

## 2016-11-12 DIAGNOSIS — R609 Edema, unspecified: Secondary | ICD-10-CM

## 2016-11-12 DIAGNOSIS — R338 Other retention of urine: Secondary | ICD-10-CM

## 2016-11-12 DIAGNOSIS — R310 Gross hematuria: Secondary | ICD-10-CM

## 2016-11-12 LAB — BASIC METABOLIC PANEL
ANION GAP: 9 (ref 5–15)
BUN: 9 mg/dL (ref 6–20)
CALCIUM: 8.6 mg/dL — AB (ref 8.9–10.3)
CO2: 29 mmol/L (ref 22–32)
Chloride: 106 mmol/L (ref 101–111)
Creatinine, Ser: 0.9 mg/dL (ref 0.61–1.24)
Glucose, Bld: 111 mg/dL — ABNORMAL HIGH (ref 65–99)
POTASSIUM: 3.5 mmol/L (ref 3.5–5.1)
Sodium: 144 mmol/L (ref 135–145)

## 2016-11-12 LAB — CBC
HEMATOCRIT: 27.5 % — AB (ref 39.0–52.0)
Hemoglobin: 9.2 g/dL — ABNORMAL LOW (ref 13.0–17.0)
MCH: 30.5 pg (ref 26.0–34.0)
MCHC: 33.5 g/dL (ref 30.0–36.0)
MCV: 91.1 fL (ref 78.0–100.0)
PLATELETS: 139 10*3/uL — AB (ref 150–400)
RBC: 3.02 MIL/uL — AB (ref 4.22–5.81)
RDW: 14.8 % (ref 11.5–15.5)
WBC: 3.7 10*3/uL — AB (ref 4.0–10.5)

## 2016-11-12 LAB — URIC ACID: Uric Acid, Serum: 4.2 mg/dL — ABNORMAL LOW (ref 4.4–7.6)

## 2016-11-12 MED ORDER — LIP MEDEX EX OINT
TOPICAL_OINTMENT | CUTANEOUS | Status: AC
  Administered 2016-11-12: 15:00:00
  Filled 2016-11-12: qty 7

## 2016-11-12 MED ORDER — LORAZEPAM 2 MG/ML IJ SOLN
1.0000 mg | Freq: Once | INTRAMUSCULAR | Status: AC
  Administered 2016-11-12: 1 mg via INTRAVENOUS
  Filled 2016-11-12: qty 1

## 2016-11-12 NOTE — [place] FL2 (Signed)
[place] MEDICAID FL2 LEVEL OF CARE SCREENING TOOL     IDENTIFICATION  Patient Name: Ricky Boyd Birthdate: 06/04/1940 Sex: male Admission Date (Current Location): 11/09/2016  [facility] and [place] Number:  Herbalist and Address:  [facility],  [place] [address], [place]      Provider Number: [id number]  Attending Physician Name and Address:  Florencia Reasons, MD  Relative Name and Phone Number:       Current Level of Care: Hospital Recommended Level of Care: [place] Prior Approval Number:    Date Approved/Denied:   PASRR Number: [id number]  Discharge Plan: SNF    Current Diagnoses: Patient Active Problem List   Diagnosis Date Noted  . Pneumonia 11/09/2016  . Normocytic anemia 10/28/2016  . Enterococcal bacteremia 10/28/2016  . Bacteremia due to Enterobacter species 10/28/2016  . Sepsis secondary to UTI ([place]) 10/25/2016  . Hyperglycemia 10/25/2016  . Thrombocytopenia ([place]) 10/25/2016  . Hematuria 10/24/2016  . Urinary retention 10/08/2016  . Acute encephalopathy 09/16/2016  . Dementia 09/16/2016  . Urinary tract infection without hematuria   . Dehydration   . Malignant neoplasm of prostate ([place]) 04/23/2016  . Erectile dysfunction 07/23/2015  . 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 Elevated PSA, less than 10 ng/ml 06/27/2012  . GOUT 12/26/2008  . 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 History of colonic polyps 10/28/2006  . 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    Orientation RESPIRATION BLADDER Height & Weight     Self, Time, Situation, Place  Normal Indwelling catheter Weight: 182 lb (82.6 kg) Height:  5\' 9"  (175.3 cm)  BEHAVIORAL SYMPTOMS/MOOD NEUROLOGICAL BOWEL NUTRITION STATUS      Continent Diet  AMBULATORY STATUS COMMUNICATION OF NEEDS Skin   Extensive Assist Verbally PU Stage and Appropriate Care                       Personal Care Assistance Level of Assistance  Bathing,  Feeding, Dressing Bathing Assistance: Limited assistance Feeding assistance: Independent Dressing Assistance: Limited assistance     Functional Limitations Info  Sight, Hearing, Speech Sight Info: Adequate Hearing Info: Adequate Speech Info: Adequate    SPECIAL CARE FACTORS FREQUENCY  OT (By licensed OT)     PT Frequency: 5X OT Frequency: 5X            Contractures Contractures Info: Not present    Additional Factors Info  Code Status, Allergies Code Status Info: DNR Allergies Info: Hydrocodone, Indocin Indomethacin, Amoxicillin-pot Clavulanate, Erythromycin Ethylsuccinate           Current Medications (11/12/2016):  This is the current hospital active medication list Current Facility-Administered Medications  Medication Dose Route Frequency Provider Last Rate Last Dose  . acetaminophen (TYLENOL) tablet 650 mg  650 mg Oral Q6H PRN Lavina Hamman, MD   650 mg at 11/10/16 1135  . albuterol (PROVENTIL) (2.5 MG/3ML) 0.083% nebulizer solution 2.5 mg  2.5 mg Nebulization Q6H PRN Gennaro Africa, MD      . colchicine tablet 0.6 mg  0.6 mg Oral TID Lisette Abu, PA-C   0.6 mg at 11/12/16 1218  . donepezil (ARICEPT) tablet 10 mg  10 mg Oral Daily Gennaro Africa, MD   10 mg at 11/11/16 1138   And  . memantine (NAMENDA XR) 24 hr capsule 28 mg  28 mg Oral Daily Gennaro Africa, MD   28 mg at 11/11/16 1137  . heparin injection 5,000 Units  5,000 Units  Subcutaneous Q8H Gennaro Africa, MD   5,000 Units at 11/12/16 [phone]  . lip balm (CARMEX) ointment           . predniSONE (DELTASONE) tablet 30 mg  30 mg Oral Q breakfast Gennaro Africa, MD   30 mg at 11/12/16 0834  . QUEtiapine (SEROQUEL) tablet 12.5 mg  12.5 mg Oral QHS Gennaro Africa, MD   12.5 mg at 11/11/16 2242  . senna-docusate (Senokot-S) tablet 1 tablet  1 tablet Oral Daily Gennaro Africa, MD   1 tablet at 11/12/16 1219  . sulfamethoxazole-trimethoprim (BACTRIM DS,SEPTRA DS) 800-160 MG per tablet 1 tablet  1 tablet Oral Q12H Florencia Reasons, MD    1 tablet at 11/12/16 1219  . tamsulosin (FLOMAX) capsule 0.4 mg  0.4 mg Oral QPC supper Gennaro Africa, MD   0.4 mg at 11/11/16 1838  . traMADol (ULTRAM) tablet 50 mg  50 mg Oral Q8H PRN Gennaro Africa, MD   50 mg at 11/11/16 2242     Discharge Medications: Please see discharge summary for a list of discharge medications.  Relevant Imaging Results:  Relevant Lab Results:   Additional Information SSN:  [id number]  Lia Hopping, LCSW

## 2016-11-12 NOTE — Consult Note (Signed)
Urology Consult Note   Requesting Attending Physician:  Florencia Reasons, MD Service Providing Consult: Urology  Consulting Attending: Dr. Junious Silk   Reason for Consult:  Urinary retention  HPI: Ricky Boyd is seen in consultation for reasons noted above at the request of Florencia Reasons, MD for evaluation of urinary retention.  This is a 76 y.o. male with a history of dementia and Gl 4+4 localized prostate cancer (orginally on AS for Gl 6 disease in 2014, rebiopsy in 02/2016 showed 4+4) undergoing primary radiation therapy beginning in 06/2016. Things initially went well, but he developed urinary retention requiring Foley placement on 09/12/16, and has had several admissions since that time for UTI. He was most recently seen in clinic last month, where he failed TOV. The plan at that time was to schedule VUDS, in anticipation of an outlet procedure.   The patient was unfortunately admitted on 11/09/16 for HCAP. UA negative on admission. He had his Foley replaced last night after a piece had broken off per nursing. The new catheter has not drained since placement.   The family asks if we can move up any scheduled procedures to evaluate his bladder.    Past Medical History: Past Medical History:  Diagnosis Date  . Arthritis   . Dementia 09/16/2016  . Gout    usually knees, occasionally feet  . Hearing aid worn   . Prostate cancer [facility])     Past Surgical History:  Past Surgical History:  Procedure Laterality Date  . COLONOSCOPY    . HEMORROIDECTOMY    . PROSTATE BIOPSY    . TONSILLECTOMY AND ADENOIDECTOMY      Medication: Current Facility-Administered Medications  Medication Dose Route Frequency Provider Last Rate Last Dose  . acetaminophen (TYLENOL) tablet 650 mg  650 mg Oral Q6H PRN Lavina Hamman, MD   650 mg at 11/10/16 1135  . albuterol (PROVENTIL) (2.5 MG/3ML) 0.083% nebulizer solution 2.5 mg  2.5 mg Nebulization Q6H PRN Gennaro Africa, MD      . colchicine tablet 0.6 mg  0.6 mg Oral TID  Lisette Abu, PA-C   0.6 mg at 11/11/16 2243  . donepezil (ARICEPT) tablet 10 mg  10 mg Oral Daily Gennaro Africa, MD   10 mg at 11/11/16 1138   And  . memantine (NAMENDA XR) 24 hr capsule 28 mg  28 mg Oral Daily Gennaro Africa, MD   28 mg at 11/11/16 1137  . heparin injection 5,000 Units  5,000 Units Subcutaneous Q8H Gennaro Africa, MD   5,000 Units at 11/12/16 3570  . predniSONE (DELTASONE) tablet 30 mg  30 mg Oral Q breakfast Gennaro Africa, MD   30 mg at 11/12/16 0834  . QUEtiapine (SEROQUEL) tablet 12.5 mg  12.5 mg Oral QHS Gennaro Africa, MD   12.5 mg at 11/11/16 2242  . senna-docusate (Senokot-S) tablet 1 tablet  1 tablet Oral Daily Gennaro Africa, MD   1 tablet at 11/11/16 1139  . sulfamethoxazole-trimethoprim (BACTRIM DS,SEPTRA DS) 800-160 MG per tablet 1 tablet  1 tablet Oral Q12H Florencia Reasons, MD   1 tablet at 11/11/16 2242  . tamsulosin (FLOMAX) capsule 0.4 mg  0.4 mg Oral QPC supper Gennaro Africa, MD   0.4 mg at 11/11/16 1838  . traMADol (ULTRAM) tablet 50 mg  50 mg Oral Q8H PRN Gennaro Africa, MD   50 mg at 11/11/16 2242    Allergies: Allergies  Allergen Reactions  . Hydrocodone Other (See Comments)    Reaction:  Hallucinations  . Indocin [  Indomethacin] Other (See Comments)    Reaction:  Confusion   . Amoxicillin-Pot Clavulanate Other (See Comments)    Reaction:  Nightmares Has patient had a PCN reaction causing immediate rash, facial/tongue/throat swelling, SOB or lightheadedness with hypotension: No Has patient had a PCN reaction causing severe rash involving mucus membranes or skin necrosis: No Has patient had a PCN reaction that required hospitalization: No Has patient had a PCN reaction occurring within the last 10 years: Yes If all of the above answers are "NO", then may proceed with Cephalosporin use.  . Erythromycin Ethylsuccinate Other (See Comments)    Reaction:  Eye irritation     Social History: Social History  Substance Use Topics  . Smoking status: Former Smoker     Packs/day: 0.25    Years: 10.00    Types: Cigarettes    Quit date: 02/23/1958  . Smokeless tobacco: Former Systems developer     Comment: quit 1963  . Alcohol use No     Comment: h/o occasional use    Family History Family History  Problem Relation Age of Onset  . Diverticulitis Father   . Depression Brother   . Cancer Paternal Uncle   . Colon cancer Neg Hx     Review of Systems 10 systems were reviewed and are negative except as noted specifically in the HPI.  Objective   Vital signs in last 24 hours: BP 137/75 (BP Location: Left Arm)   Pulse 75   Temp 98.4 F (36.9 C) (Oral)   Resp 20   Ht 5\' 9"  (1.753 m)   Wt 82.6 kg (182 lb)   SpO2 96%   BMI 26.88 kg/m   Physical Exam General: Resting, not cooperate with exam HEENT: [place]/AT, EOMI, MMM Pulmonary: Normal work of breathing Cardiovascular: HDS, adequate peripheral perfusion Abdomen: Soft, NTTP, nondistended. GU: Normal circumcised penis. 16Fr foley in place draining clear pink urine,  Extremities: warm and well perfused Neuro: Sleepy, oriented to person  Most Recent Labs: Lab Results  Component Value Date   WBC 3.7 (L) 11/12/2016   HGB 9.2 (L) 11/12/2016   HCT 27.5 (L) 11/12/2016   PLT 139 (L) 11/12/2016    Lab Results  Component Value Date   NA 144 11/12/2016   K 3.5 11/12/2016   CL 106 11/12/2016   CO2 29 11/12/2016   BUN 9 11/12/2016   CREATININE 0.90 11/12/2016   CALCIUM 8.6 (L) 11/12/2016   MG 1.9 11/11/2016    Lab Results  Component Value Date   INR 1.28 10/25/2016   APTT 34 10/25/2016     IMAGING: Ct Chest Wo Contrast  Result Date: 11/11/2016 CLINICAL DATA:  Fever, chills. EXAM: CT CHEST WITHOUT CONTRAST TECHNIQUE: Multidetector CT imaging of the chest was performed following the standard protocol without IV contrast. COMPARISON:  Radiographs of November 09, 2016. CT scan of March 18, 2007. FINDINGS: Cardiovascular: 4.5 cm ascending thoracic aortic aneurysm is noted. Normal cardiac size. No  pericardial effusion is noted. Mediastinum/Nodes: No enlarged mediastinal or axillary lymph nodes. Thyroid gland, trachea, and esophagus demonstrate no significant findings. Lungs/Pleura: No pneumothorax or pleural effusion is noted. Stable peripheral honeycombing is noted in both lung bases and upper lobes. No acute pulmonary disease is noted. No pulmonary nodule is noted. Upper Abdomen: No acute abnormality. Musculoskeletal: No chest Thier mass or suspicious bone lesions identified. IMPRESSION: 4.5 cm ascending thoracic aortic aneurysm. Ascending thoracic aortic aneurysm. Recommend semi-annual imaging followup by CTA or MRA and referral to cardiothoracic surgery if not already  obtained. This recommendation follows 2010 ACCF/AHA/AATS/ACR/ASA/SCA/SCAI/SIR/STS/SVM Guidelines for the Diagnosis and Management of Patients With Thoracic Aortic Disease. Circulation. 2010; 121: X646-O032. Peripheral honeycombing is noted in both upper and lower lobes consistent with pulmonary fibrosis. No acute abnormality is noted in the chest. Electronically Signed   By: Marijo Conception, M.D.   On: 11/11/2016 16:13    ------  Assessment:  76 y.o. male with dementia and Gl 4+4 prostate cancer undergoing primary radiation therapy. Has been in retention since 09/12/16 and has had multiple failed trials of void and readmissions for UTI. Here now with HCAP.   Urology called to evaluate nondraining catheter placed last night. On exam, the catheter was likely inflated inside the prostate. Balloon deflated, catheter advanced, at which point 500cc of clear pink urine drained into the bag. 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 May benefit from an outlet procedure, but would need to be further evaluated as an outpatient prior to this.    Recommendations: - Leave Foley catheter in place to drain through discharge.  - Flush  catheter with 200cc NS prn for clots.  - Will try to move up appointments per families request.  - Urology will sign off for now, please page with any questions or concerns.   Thank you for this consult. Please contact the urology consult pager with any further questions/concerns.  Attending attestation:  Patient was seen and examined on rounds this afternoon. I discussed with Dr. Jess Barters and agree with his assessment and plan. This is a difficult situation. Discussed with patient's caregiver and friend the nature, risks and benefits of an indwelling Foley, CIC or suprapubic tube. They've had trouble with his CIC before and with possible prostate/urethral trauma and his significant retention, I would recommend continuing the Foley catheter. As far as an outlet procedure, we discussed there is a high risk of stricture or incontinence given the radiation. They'll consider. Discussed with Dr. Diona Fanti.

## 2016-11-12 NOTE — Progress Notes (Signed)
VASCULAR LAB PRELIMINARY  PRELIMINARY  PRELIMINARY  PRELIMINARY  Right lower extremity venous duplex completed.    Preliminary report:  Right:  No evidence of DVT, superficial thrombosis, or Baker's cyst.  Darlene Brozowski, RVS 11/12/2016, 3:39 PM

## 2016-11-12 NOTE — Care Management Important Message (Signed)
Important Message  Patient Details  Name: Ricky Boyd MRN: [id number] Date of Birth: 23-May-1940   Medicare Important Message Given:  Yes    Kerin Salen 11/12/2016, 11:18 AMImportant Message  Patient Details  Name: Ricky Boyd MRN: [id number] Date of Birth: October 19, 1940   Medicare Important Message Given:  Yes    Kerin Salen 11/12/2016, 11:17 AM

## 2016-11-12 NOTE — Progress Notes (Signed)
PT Cancellation Note  Patient Details Name: Ricky Boyd MRN: [id number] DOB: August 03, 1940   Cancelled Treatment:    Reason Eval/Treat Not Completed: Other (comment) Doppler pending.     Shavone Nevers,KATHrine E 11/12/2016, 1:58 PM Carmelia Bake, PT, DPT 11/12/2016 Pager: [phone]

## 2016-11-12 NOTE — Evaluation (Signed)
Clinical/Bedside Swallow Evaluation Patient Details  Name: Ricky Boyd MRN: [id number] Date of Birth: [birth year]  Today's Date: 11/12/2016 Time: SLP Start Time (ACUTE ONLY): 1435 SLP Stop Time (ACUTE ONLY): 1449 SLP Time Calculation (min) (ACUTE ONLY): 14 min  Past Medical History:  Past Medical History:  Diagnosis Date  . Arthritis   . Dementia 09/16/2016  . Gout    usually knees, occasionally feet  . Hearing aid worn   . Prostate cancer [facility])    Past Surgical History:  Past Surgical History:  Procedure Laterality Date  . COLONOSCOPY    . HEMORROIDECTOMY    . PROSTATE BIOPSY    . TONSILLECTOMY AND ADENOIDECTOMY     HPI:  75 y.o.malewith a history of dementia and Gl 4+4 localized prostate cancer undergoing primary radiation therapy beginning in 06/2016. Things initially went well, but he developed urinary retention requiring Foley placement on 09/12/16, and has had several admissions since that time for UTI. Admitted 11/09/16 for HCAP. CXR 9/17: patchy bilateral airspace disease, cannot exclude PNA. CT chest 9/20: 4.5 cm ascending thoracic aortic aneurysm.    Assessment / Plan / Recommendation Clinical Impression  Patient lethargic but arousable for po intake with moderate-max cues for sustained level of alertness. Able to consume po trials without overt indication of dysphagia or aspiration. Caregiver, significant other report no observable difficulty or signs of aspiration and no h/o recurrent PNA. No SLP f/u indicated at this time.  SLP Visit Diagnosis: Dysphagia, unspecified (R13.10)    Aspiration Risk  Mild aspiration risk    Diet Recommendation Regular;Thin liquid   Liquid Administration via: Cup;Straw Medication Administration: Whole meds with liquid Supervision: Patient able to self feed Compensations: Slow rate;Small sips/bites Postural Changes: Seated upright at 90 degrees    Other  Recommendations Oral Care Recommendations: Oral care BID   Follow up  Recommendations None        Swallow Study   General HPI: 76 y.o.malewith a history of dementia and Gl 4+4 localized prostate cancer undergoing primary radiation therapy beginning in 06/2016. Things initially went well, but he developed urinary retention requiring Foley placement on 09/12/16, and has had several admissions since that time for UTI. Admitted 11/09/16 for HCAP. CXR 9/17: patchy bilateral airspace disease, cannot exclude PNA. CT chest 9/20: 4.5 cm ascending thoracic aortic aneurysm.  Type of Study: Bedside Swallow Evaluation Previous Swallow Assessment: none Diet Prior to this Study: Regular;Thin liquids Temperature Spikes Noted: No Respiratory Status: Room air History of Recent Intubation: No Behavior/Cognition: Lethargic/Drowsy Oral Cavity Assessment: Within Functional Limits Oral Care Completed by SLP: Recent completion by staff Oral Cavity - Dentition: Adequate natural dentition;Dentures, top Vision: Functional for self-feeding Self-Feeding Abilities: Needs assist (due to current lethargy, confusion) Patient Positioning: Upright in bed Baseline Vocal Quality: Normal Volitional Cough: Cognitively unable to elicit Volitional Swallow: Unable to elicit    Oral/Motor/Sensory Function Overall Oral Motor/Sensory Function: Within functional limits   Ice Chips Ice chips: Not tested   Thin Liquid Thin Liquid: Within functional limits Presentation: Straw    Nectar Thick Nectar Thick Liquid: Not tested   Honey Thick Honey Thick Liquid: Not tested   Puree Puree: Within functional limits Presentation: Spoon   Solid   Meryn Sarracino MA, CCC-SLP [phone]    Solid: Within functional limits        Delphina Schum [name] 11/12/2016,2:52 PM

## 2016-11-12 NOTE — Progress Notes (Signed)
PROGRESS NOTE  Ricky Boyd EGB:[id number] DOB: 20-Apr-1940 DOA: 11/09/2016 PCP: Lucille Passy, MD  HPI/Recap of past 24 hours:  Had agitation last night required ativan, he is in deep sleep this am  Foley malfunction last night, how gross hematuria, dedcreased urine output  Oriented to person only He required two person assist for transfer Personal care giver at bedside  Assessment/Plan: Active Problems:   Pneumonia  Encephalopathy:  He was sent back to hospital  From SNF due to concerns of fever and altered mental status at SNF, he completed antibiotics for uti at snf prior to sent back to the hospital. Today he is alert and talkative, but he avoid answers questions directly, personal care giver at bedside, report patient normally is very talkative and make more sense Patient was hospitalized in July and early September with altered mental status thought due to uti. He did have fever 102 on admission, he is  Treated with cefepime, fever resolved, culture unrevealing, cxr with pneumonia? Will get ct chest , changed abx to bactrim   There is also concerns of gout related fever, he is treated with steroids and colchicine. Ortho consulted, no need of arthrocentesis, uric acid 4.2.  Pedal edema , right lower extremity: venous doppler pending  H/o prostate cancer with urinary retention with indwelling foley, last changed on 9/1. Gross hematuria, foley malfunction, urology consulted.  Dementia: continue home meds  FTT: pt eval, snf placement  Code Status: DNR  Family Communication: patient and personal care giver  And daughter at bedside  Disposition Plan: SNF hopefully on 9/21   Consultants:  Orthopedics  urology  Procedures:  none  Antibiotics:  Cefepime from admission to 9/19,   bactrim from 9/19   Objective: BP 137/75 (BP Location: Left Arm)   Pulse 75   Temp 98.4 F (36.9 C) (Oral)   Resp 20   Ht 5\' 9"  (1.753 m)   Wt 82.6 kg (182 lb)   SpO2 96%    BMI 26.88 kg/m   Intake/Output Summary (Last 24 hours) at 11/12/16 1002 Last data filed at 11/11/16 1900  Gross per 24 hour  Intake                0 ml  Output              750 ml  Net             -750 ml   Filed Weights   11/09/16 0622  Weight: 82.6 kg (182 lb)    Exam: Patient is examined daily including today on 11/12/2016, exams remain the same as of yesterday except that has changed    General:  Frail, in deep sleep, open eyes briefly to voice  Cardiovascular: RRR  Respiratory: CTABL  Abdomen: Soft/ND/NT, positive BS  Musculoskeletal: pedal Edema right foot  Neuro: in deep sleep  Data Reviewed: Basic Metabolic Panel:  Recent Labs Lab 11/09/16 0626 11/10/16 0651 11/11/16 0622  NA 137 138 143  K 3.1* 3.2* 3.5  CL 98* 103 109  CO2 28 29 28   GLUCOSE 112* 108* 115*  BUN 9 7 7   CREATININE 0.94 0.90 0.82  CALCIUM 8.3* 7.9* 8.2*  MG  --   --  1.9   Liver Function Tests:  Recent Labs Lab 11/09/16 0626 11/10/16 0651  AST 20 22  ALT 21 20  ALKPHOS 54 52  BILITOT 0.9 0.5  PROT 6.5 5.8*  ALBUMIN 3.0* 2.8*   No results for input(s): LIPASE, AMYLASE  in the last 168 hours. No results for input(s): AMMONIA in the last 168 hours. CBC:  Recent Labs Lab 11/09/16 0626 11/10/16 0651 11/11/16 0622  WBC 5.4 3.6* 4.4  NEUTROABS 4.3  --   --   HGB 10.5* 9.6* 8.8*  HCT 30.8* 27.6* 26.1*  MCV 92.2 91.4 92.2  PLT 125* 114* 134*   Cardiac Enzymes:   No results for input(s): CKTOTAL, CKMB, CKMBINDEX, TROPONINI in the last 168 hours. BNP (last 3 results) No results for input(s): BNP in the last 8760 hours.  ProBNP (last 3 results) No results for input(s): PROBNP in the last 8760 hours.  CBG: No results for input(s): GLUCAP in the last 168 hours.  Recent Results (from the past 240 hour(s))  Culture, blood (Routine x 2)     Status: None (Preliminary result)   Collection Time: 11/09/16  6:26 AM  Result Value Ref Range Status   Specimen Description BLOOD  LEFT ARM  Final   Special Requests   Final    BOTTLES DRAWN AEROBIC AND ANAEROBIC Blood Culture adequate volume   Culture   Final    NO GROWTH 2 DAYS Performed at [facility], [address] [place], [place] [address]    Report Status PENDING  Incomplete  Culture, blood (Routine x 2)     Status: None (Preliminary result)   Collection Time: 11/09/16  6:45 AM  Result Value Ref Range Status   Specimen Description BLOOD RIGHT FOREARM  Final   Special Requests   Final    BOTTLES DRAWN AEROBIC AND ANAEROBIC Blood Culture adequate volume   Culture   Final    NO GROWTH 2 DAYS Performed at [facility], [place] [address], [place], [place] [address]    Report Status PENDING  Incomplete  Urine culture     Status: None   Collection Time: 11/09/16  7:05 AM  Result Value Ref Range Status   Specimen Description URINE, CATHETERIZED  Final   Special Requests NONE  Final   Culture   Final    NO GROWTH Performed at [facility], [place] [address], [place], [place] [address]    Report Status 11/10/2016 FINAL  Final  MRSA PCR Screening     Status: None   Collection Time: 11/10/16 10:03 AM  Result Value Ref Range Status   MRSA by PCR NEGATIVE NEGATIVE Final    Comment:        The GeneXpert MRSA Assay (FDA approved for NASAL specimens only), is one component of a comprehensive MRSA colonization surveillance program. It is not intended to diagnose MRSA infection nor to guide or monitor treatment for MRSA infections.      Studies: Ct Chest Wo Contrast  Result Date: 11/11/2016 CLINICAL DATA:  Fever, chills. EXAM: CT CHEST WITHOUT CONTRAST TECHNIQUE: Multidetector CT imaging of the chest was performed following the standard protocol without IV contrast. COMPARISON:  Radiographs of November 09, 2016. CT scan of March 18, 2007. FINDINGS: Cardiovascular: 4.5 cm ascending thoracic aortic aneurysm is noted. Normal cardiac size. No pericardial effusion is noted. Mediastinum/Nodes: No  enlarged mediastinal or axillary lymph nodes. Thyroid gland, trachea, and esophagus demonstrate no significant findings. Lungs/Pleura: No pneumothorax or pleural effusion is noted. Stable peripheral honeycombing is noted in both lung bases and upper lobes. No acute pulmonary disease is noted. No pulmonary nodule is noted. Upper Abdomen: No acute abnormality. Musculoskeletal: No chest Ralph mass or suspicious bone lesions identified. IMPRESSION: 4.5 cm ascending thoracic aortic  aneurysm. Ascending thoracic aortic aneurysm. Recommend semi-annual imaging followup by CTA or MRA and referral to cardiothoracic surgery if not already obtained. This recommendation follows 2010 ACCF/AHA/AATS/ACR/ASA/SCA/SCAI/SIR/STS/SVM Guidelines for the Diagnosis and Management of Patients With Thoracic Aortic Disease. Circulation. 2010; 121: K917-H150. Peripheral honeycombing is noted in both upper and lower lobes consistent with pulmonary fibrosis. No acute abnormality is noted in the chest. Electronically Signed   By: Marijo Conception, M.D.   On: 11/11/2016 16:13    Scheduled Meds: . colchicine  0.6 mg Oral TID  . donepezil  10 mg Oral Daily   And  . memantine  28 mg Oral Daily  . heparin  5,000 Units Subcutaneous Q8H  . predniSONE  30 mg Oral Q breakfast  . QUEtiapine  12.5 mg Oral QHS  . senna-docusate  1 tablet Oral Daily  . sulfamethoxazole-trimethoprim  1 tablet Oral Q12H  . tamsulosin  0.4 mg Oral QPC supper    Continuous Infusions:   Time spent: 102mins I have personally reviewed and interpreted on  11/12/2016 daily labs, tele strips, imagings as discussed above under data review session and assessment and plans.  I reviewed all nursing notes, pharmacy notes, consultant notes,  vitals, pertinent old records  I have discussed plan of care as described above with RN , patient  on 11/12/2016   Quintasha Gren MD, PhD  [facility] Pager [phone]. If 7PM-7AM, please contact night-coverage at [email or web address],  password [facility] 11/12/2016, 10:02 AM  LOS: 3 days

## 2016-11-13 DIAGNOSIS — Z9289 Personal history of other medical treatment: Secondary | ICD-10-CM

## 2016-11-13 DIAGNOSIS — E876 Hypokalemia: Secondary | ICD-10-CM

## 2016-11-13 DIAGNOSIS — M1009 Idiopathic gout, multiple sites: Secondary | ICD-10-CM

## 2016-11-13 DIAGNOSIS — G9341 Metabolic encephalopathy: Secondary | ICD-10-CM

## 2016-11-13 LAB — CBC
HCT: 27.6 % — ABNORMAL LOW (ref 39.0–52.0)
Hemoglobin: 9.1 g/dL — ABNORMAL LOW (ref 13.0–17.0)
MCH: 29.8 pg (ref 26.0–34.0)
MCHC: 33 g/dL (ref 30.0–36.0)
MCV: 90.5 fL (ref 78.0–100.0)
Platelets: 149 10*3/uL — ABNORMAL LOW (ref 150–400)
RBC: 3.05 MIL/uL — AB (ref 4.22–5.81)
RDW: 14.6 % (ref 11.5–15.5)
WBC: 2.8 10*3/uL — AB (ref 4.0–10.5)

## 2016-11-13 LAB — BASIC METABOLIC PANEL
ANION GAP: 9 (ref 5–15)
BUN: 7 mg/dL (ref 6–20)
CALCIUM: 8.5 mg/dL — AB (ref 8.9–10.3)
CO2: 28 mmol/L (ref 22–32)
Chloride: 106 mmol/L (ref 101–111)
Creatinine, Ser: 0.93 mg/dL (ref 0.61–1.24)
GLUCOSE: 91 mg/dL (ref 65–99)
POTASSIUM: 3 mmol/L — AB (ref 3.5–5.1)
Sodium: 143 mmol/L (ref 135–145)

## 2016-11-13 MED ORDER — PREDNISONE 10 MG PO TABS: ORAL_TABLET | ORAL | 0 refills | Status: DC

## 2016-11-13 MED ORDER — POTASSIUM CHLORIDE CRYS ER 20 MEQ PO TBCR
40.0000 meq | EXTENDED_RELEASE_TABLET | ORAL | Status: DC
  Administered 2016-11-13: 40 meq via ORAL
  Filled 2016-11-13: qty 2

## 2016-11-13 MED ORDER — SULFAMETHOXAZOLE-TRIMETHOPRIM 800-160 MG PO TABS: 1.0000 | ORAL_TABLET | Freq: Two times a day (BID) | ORAL | 0 refills | Status: AC

## 2016-11-13 NOTE — Progress Notes (Addendum)
Patient will discharge to [facility].  CSW informed patient daughter [name].  Patient will transport by [facility]. DNR signed. Med. Nes. Form complete Nurse given number for report. Patient going to Room 401A. 1:00pm [facility] called for transport.   Kathrin Greathouse, Latanya Presser, MSW Clinical Social Worker 5E and Psychiatric Service Line [phone] 11/13/2016  10:54 AM

## 2016-11-13 NOTE — Care Management Note (Signed)
Case Management Note  Patient Details  Name: Ricky Boyd MRN: [id number] Date of Birth: 1940/10/29  Subjective/Objective:                  Urinary retention  Action/Plan: November 12, 2016 Velva Harman, BSN, [place],  [place] Chart reviewed for case management and discharge needs. Next review date: [id number]  Expected Discharge Date:   (UNKNOWN)               Expected Discharge Plan:  Assisted Living / Rest Home  In-House Referral:  Clinical Social Work  Discharge planning Services  CM Consult  Post Acute Care Choice:    Choice offered to:     DME Arranged:    DME Agency:     HH Arranged:    HH Agency:     Status of Service:  In process, will continue to follow  If discussed at Long Length of Stay Meetings, dates discussed:    Additional Comments:  Leeroy Cha, RN 11/13/2016, 9:12 AM

## 2016-11-13 NOTE — Evaluation (Signed)
Physical Therapy Evaluation Patient Details Name: Ricky Boyd MRN: [id number] DOB: [birth year] Today's Date: 11/13/2016   History of Present Illness  76 y.o. male with medical history significant of prostate cancer, mild dementia, and gout and sent from SNF due to concerns of fever and altered mental status, found to have pneumonia and gout  Clinical Impression  Pt admitted with above diagnosis. Pt currently with functional limitations due to the deficits listed below (see PT Problem List).  Pt will benefit from skilled PT to increase their independence and safety with mobility to allow discharge to the venue listed below.   Pt required increased time to perform mobility.  Pt assisted with ambulating in hallway.  Plan is for d/c to SNF possibly today.     Follow Up Recommendations SNF;Supervision/Assistance - 24 hour    Equipment Recommendations  None recommended by PT    Recommendations for Other Services       Precautions / Restrictions Precautions Precautions: Fall Precaution Comments: dementia      Mobility  Bed Mobility Overal bed mobility: Needs Assistance Bed Mobility: Supine to Sit     Supine to sit: Min assist     General bed mobility comments: increased time, HOB elevated, assist for trunk upright  Transfers Overall transfer level: Needs assistance Equipment used: Rolling walker (2 wheeled) Transfers: Sit to/from Stand Sit to Stand: Min assist         General transfer comment: assist to rise, verbal cues for hand placement and safety  Ambulation/Gait Ambulation/Gait assistance: Min guard Ambulation Distance (Feet): 140 Feet Assistive device: Rolling walker (2 wheeled) Gait Pattern/deviations: Step-through pattern;Decreased stride length;Trunk flexed     General Gait Details: verbal cues for RW positioning, pt incontinent of bowels, BSC brought to pt, cleaned floor and changed socks prior to ambulating back to room  Stairs            Wheelchair  Mobility    Modified Rankin (Stroke Patients Only)       Balance                                             Pertinent Vitals/Pain Pain Assessment: Faces Faces Pain Scale: No hurt Pain Intervention(s): Repositioned    Home Living Family/patient expects to be discharged to:: Skilled nursing facility                      Prior Function Level of Independence: Needs assistance   Gait / Transfers Assistance Needed: using rollator for ambulation   ADL's / Homemaking Assistance Needed: Caregiver assists with getting bathing/dressing ADLs, receives assist for iADLs        Hand Dominance        Extremity/Trunk Assessment        Lower Extremity Assessment Lower Extremity Assessment: Generalized weakness       Communication   Communication: Expressive difficulties (dysarthric speech at times - garbled)  Cognition Arousal/Alertness: Awake/alert Behavior During Therapy: WFL for tasks assessed/performed Overall Cognitive Status: History of cognitive impairments - at baseline                                        General Comments      Exercises     Assessment/Plan    PT Assessment Patient  needs continued PT services  PT Problem List Decreased strength;Decreased cognition;Decreased activity tolerance;Decreased safety awareness;Decreased mobility;Decreased knowledge of precautions;Decreased balance       PT Treatment Interventions DME instruction;Gait training;Functional mobility training;Therapeutic activities;Patient/family education    PT Goals (Current goals can be found in the Care Plan section)  Acute Rehab PT Goals PT Goal Formulation: With patient/family Time For Goal Achievement: 11/20/16 Potential to Achieve Goals: Good    Frequency Min 2X/week   Barriers to discharge        Co-evaluation               AM-PAC PT "6 Clicks" Daily Activity  Outcome Measure Difficulty turning over in bed  (including adjusting bedclothes, sheets and blankets)?: A Little Difficulty moving from lying on back to sitting on the side of the bed? : Unable Difficulty sitting down on and standing up from a chair with arms (e.g., wheelchair, bedside commode, etc,.)?: Unable Help needed moving to and from a bed to chair (including a wheelchair)?: A Little Help needed walking in hospital room?: A Little Help needed climbing 3-5 steps with a railing? : A Lot 6 Click Score: 13    End of Session Equipment Utilized During Treatment: Gait belt Activity Tolerance: Patient tolerated treatment well Patient left: with call bell/phone within reach;with family/visitor present;in chair (personal caregiver with pt)   PT Visit Diagnosis: Difficulty in walking, not elsewhere classified (R26.2)    Time: 2505-3976 PT Time Calculation (min) (ACUTE ONLY): 53 min   Charges:   PT Evaluation $PT Eval Low Complexity: 1 Low PT Treatments $Gait Training: 8-22 mins   PT G Codes:       Carmelia Bake, PT, DPT 11/13/2016 Pager: [phone]  York Ram E 11/13/2016, 12:31 PM

## 2016-11-13 NOTE — Discharge Summary (Addendum)
Discharge Summary  Ricky Boyd ZDG:[id number] DOB: Feb 25, 1940  PCP: Lucille Passy, MD  Admit date: 11/09/2016 Discharge date: 11/13/2016  Time spent: >59mins, more than 50% time spent on coordination of care  Recommendations for Outpatient Follow-up:  1. F/u with SNF MD within a week  for hospital discharge follow up, repeat cbc/bmp at follow up 2. F/u with urology  3. F/u with orthopedics  Discharge Diagnoses:  Active Hospital Problems   Diagnosis Date Noted  . Pneumonia 11/09/2016    Resolved Hospital Problems   Diagnosis Date Noted Date Resolved  No resolved problems to display.    Discharge Condition: stable  Diet recommendation: heart healthy  Regular;Thin liquid   Liquid Administration via: Cup;Straw Medication Administration: Whole meds with liquid Supervision: Patient able to self feed Compensations: Slow rate;Small sips/bites Postural Changes: Seated upright at 90 degrees   Filed Weights   11/09/16 0622  Weight: 82.6 kg (182 lb)    History of present illness:  PCP:  Lucille Passy, MD   Chief Complaint:  Fatigue Dyspnea   History of Present Illness:  (per admitting MD) Pt is a 76 yo male with hx of dementia, prostate cancer, gout who was brought by family with cc of fatigue/dyspnea for 2-3 days with no significant cough but with fever/chills. Pt is a very poor historian due to dementia and is hard hearing so hx was taken from family and records. No other complaints per family. Pt looked in no acute distress and nodded no when asked about having any pain or anything bothering him.   Hospital Course:  Active Problems:   Pneumonia   Encephalopathy in the setting of baseline dementia:  -Patient was hospitalized in July and early September with altered mental status thought due to uti. He is discharged to snf.  -He was sent back to hospital from SNF due to concerns of fever and altered mental status at SNF. he completed antibiotics for uti at snf  prior to sent back to the hospital. --He did have fever 102 on presentation, he is  Treated with cefepime, fever resolved, culture unrevealing, cxr on presentation with pneumonia? ct chest no acute infiltrate, but does showed peripheral honeycombing consistent with pulmonary fibrosis (daughter aware of the result), -changed abx to bactrim, he is discharged on two more days of bactrim to finish treatment course. -swallow eval recommended regular diet/thin liquid -mental status has improved at discharge.   -There is also concerns of gout related fever, he is treated with steroids and colchicine. Ortho consulted, no need of arthrocentesis, uric acid 4.2. per ortho Dr Percell Miller:  "Given MTP joint redness, warmth, and pain in addition to ipsilateral ankle and knee involvement as well as normal WBC I feel confident that this is gout. I explained to family that ankle aspiration/injection would only treat the ankle symptoms and they did not want to put him through that for the limited benefit. Will increase colchicine to 0.6mg  tid; this can be decreased to prophylactic dose once symptoms resolve. Would consider Naproxen 500mg  bid unless contraindicated. Agree with prednisone, suggest 14d taper once symptoms resolve to decrease chance of rebound. I told family if symptoms did not resolve or worsened we could always come back and do aspiration. Please call if this is desired."  -patient is discharged on steroids taper, outpatient follow up with Dr Percell Miller as needed.  Pedal edema , right lower extremity: venous doppler no dvt. Resolved, possible related to gout.  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 Gross hematuria, foley malfunction on 9/19 am, urology consulted. per urology:  " Leave Foley catheter in place to drain through discharge.  - Flush catheter with 200cc NS prn for clots.  - Will try to move up appointments per families request.  - Urology will  sign off for now, please page with any questions or concerns.   Thank you for this consult. Please contact the urology consult pager with any further questions/concerns.  Attending attestation:  Patient was seen and examined on rounds this afternoon. I discussed with Dr. Jess Barters and agree with his assessment and plan. This is a difficult situation. Discussed with patient's caregiver and friend the nature, risks and benefits of an indwelling Foley, CIC or suprapubic tube. They've had trouble with his CIC before and with possible prostate/urethral trauma and his significant retention, I would recommend continuing the Foley catheter. As far as an outlet procedure, we discussed there is a high risk of stricture or incontinence given the radiation. They'll consider. Discussed with Dr. Diona Fanti. "  Foley adjusted, hematuria resolved.  Hypokalemia: supplemented.  Dementia: continue home meds  4.5 cm ascending thoracic aortic aneurysm. Ascending thoracic aortic aneurysm. Recommend semi-annual imaging followup by CTA or MRA and referral to cardiothoracic surgery if not already obtained. This recommendation follows 2010 ACCF/AHA/AATS/ACR/ASA/SCA/SCAI/SIR/STS/SVM Guidelines for the Diagnosis and Management of Patients With Thoracic Aortic Disease. Circulation. 2010; 121: I297-L892.  Discussed with daughter.    FTT: pt eval, snf placement  Code Status: DNR  Family Communication: patient and personal care giver  And daughter at bedside  Disposition Plan: SNF on 9/21   Consultants:  Orthopedics  urology  Procedures:  none  Antibiotics:  Cefepime from admission to 9/19,   bactrim from 9/19   Discharge Exam: BP (!) 141/60 (BP Location: Left Arm)   Pulse (!) 54   Temp 98 F (36.7 C) (Oral)   Resp 18   Ht 5\' 9"  (1.753 m)   Wt 82.6 kg (182 lb)   SpO2 96%   BMI 26.88 kg/m   General: alert, interactive, NAD Cardiovascular: RRR Respiratory: CTABL  Discharge  Instructions You were cared for by a hospitalist during your hospital stay. If you have any questions about your discharge medications or the care you received while you were in the hospital after you are discharged, you can call the unit and asked to speak with the hospitalist on call if the hospitalist that took care of you is not available. Once you are discharged, your primary care physician will handle any further medical issues. Please note that NO REFILLS for any discharge medications will be authorized once you are discharged, as it is imperative that you return to your primary care physician (or establish a relationship with a primary care physician if you do not have one) for your aftercare needs so that they can reassess your need for medications and monitor your lab values.  Discharge Instructions    Diet - low sodium heart healthy    Complete by:  As directed    Increase activity slowly    Complete by:  As directed      Allergies as of 11/13/2016      Reactions   Hydrocodone Other (See Comments)   Reaction:  Hallucinations   Indocin [indomethacin] Other (See Comments)   Reaction:  Confusion    Amoxicillin-pot Clavulanate Other (See Comments)   Reaction:  Nightmares Has patient had a PCN reaction causing immediate rash, facial/tongue/throat swelling, SOB or lightheadedness  with hypotension: No Has patient had a PCN reaction causing severe rash involving mucus membranes or skin necrosis: No Has patient had a PCN reaction that required hospitalization: No Has patient had a PCN reaction occurring within the last 10 years: Yes If all of the above answers are "NO", then may proceed with Cephalosporin use.   Erythromycin Ethylsuccinate Other (See Comments)   Reaction:  Eye irritation       Medication List    STOP taking these medications   cefTRIAXone 1 g in dextrose 5 % 50 mL     TAKE these medications   acetaminophen 500 MG tablet Commonly known as:  TYLENOL Take 500 mg by  mouth every 6 (six) hours as needed for mild pain.   albuterol (2.5 MG/3ML) 0.083% nebulizer solution Commonly known as:  PROVENTIL Take 3 mLs (2.5 mg total) by nebulization every 6 (six) hours as needed for wheezing or shortness of breath.   colchicine 0.6 MG tablet Take 1 tablet (0.6 mg total) by mouth daily. When having a gout attack, take 2 tablets in day 1 then daily for 6 more days What changed:  Another medication with the same name was removed. Continue taking this medication, and follow the directions you see here.   Memantine HCl-Donepezil HCl 28-10 MG Cp24 Commonly known as:  NAMZARIC Take 28 mg by mouth daily.   predniSONE 10 MG tablet Commonly known as:  DELTASONE Label  & dispense according to the schedule below. 10 Pills PO for 3 days then, 8 Pills PO for 3 days, 6 Pills PO for 3 days, 4 Pills PO for 3 days, 2 Pills PO for 3 days, 1 Pills PO for 3 days, 1/2 Pill  PO for 3 days then STOP.   QUEtiapine 25 MG tablet Commonly known as:  SEROQUEL Take 0.5 tablets (12.5 mg total) by mouth at bedtime.   sennosides-docusate sodium 8.6-50 MG tablet Commonly known as:  SENOKOT-S Take 1 tablet by mouth daily.   sulfamethoxazole-trimethoprim 800-160 MG tablet Commonly known as:  BACTRIM DS,SEPTRA DS Take 1 tablet by mouth every 12 (twelve) hours.   tamsulosin 0.4 MG Caps capsule Commonly known as:  FLOMAX Take 1 capsule (0.4 mg total) by mouth daily after supper.   traMADol 50 MG tablet Commonly known as:  ULTRAM Take 50 mg by mouth every 8 (eight) hours as needed (pain).            Discharge Care Instructions        Start     Ordered   11/13/16 0000  Increase activity slowly     11/13/16 1039   11/13/16 0000  Diet - low sodium heart healthy     11/13/16 1039   11/13/16 0000  predniSONE (DELTASONE) 10 MG tablet     11/13/16 1056   11/13/16 0000  sulfamethoxazole-trimethoprim (BACTRIM DS,SEPTRA DS) 800-160 MG tablet  Every 12 hours     11/13/16 1058      Allergies  Allergen Reactions  . Hydrocodone Other (See Comments)    Reaction:  Hallucinations  . Indocin [Indomethacin] Other (See Comments)    Reaction:  Confusion   . Amoxicillin-Pot Clavulanate Other (See Comments)    Reaction:  Nightmares Has patient had a PCN reaction causing immediate rash, facial/tongue/throat swelling, SOB or lightheadedness with hypotension: No Has patient had a PCN reaction causing severe rash involving mucus membranes or skin necrosis: No Has patient had a PCN reaction that required hospitalization: No Has patient had a PCN reaction  occurring within the last 10 years: Yes If all of the above answers are "NO", then may proceed with Cephalosporin use.  . Erythromycin Ethylsuccinate Other (See Comments)    Reaction:  Eye irritation     Contact information for follow-up providers    Renette Butters, MD Follow up in 3 week(s).   Specialty:  Orthopedic Surgery Why:  as needed for gouty arthritis  Contact information: [address] [place] [address] [phone]            Contact information for after-discharge care    Destination    [facility] Follow up.   Specialty:  [place] information: [place] [phone]                   The results of significant diagnostics from this hospitalization (including imaging, microbiology, ancillary and laboratory) are listed below for reference.    Significant Diagnostic Studies: Dg Chest 2 View  Result Date: 11/09/2016 CLINICAL DATA:  Fever, UTI EXAM: CHEST  2 VIEW COMPARISON:  10/24/2016 FINDINGS: Low lung volumes. Heart is normal size. Patchy airspace opacities in the right upper lobe and throughout the left lung. These are similar to prior study the slightly more pronounced. Cannot exclude multifocal pneumonia. No effusions. IMPRESSION: Patchy bilateral airspace disease, worsening  since prior study. Cannot exclude multifocal pneumonia. Electronically Signed   By: Rolm Baptise M.D.   On: 11/09/2016 07:36   Dg Elbow Complete Left (3+view)  Result Date: 11/09/2016 CLINICAL DATA:  Left elbow pain when moved, unknown if fall, ams, pt unable to give hx, uti, fever EXAM: LEFT ELBOW - COMPLETE 3+ VIEW COMPARISON:  MR 07/28/2004 FINDINGS: There is no evidence of fracture, dislocation. Small effusion. There is no evidence of arthropathy or other focal bone abnormality. Soft tissues are unremarkable. IMPRESSION: 1. Elbow effusion without fracture or other bone abnormality. Electronically Signed   By: Lucrezia Europe M.D.   On: 11/09/2016 10:16   Ct Chest Wo Contrast  Result Date: 11/11/2016 CLINICAL DATA:  Fever, chills. EXAM: CT CHEST WITHOUT CONTRAST TECHNIQUE: Multidetector CT imaging of the chest was performed following the standard protocol without IV contrast. COMPARISON:  Radiographs of November 09, 2016. CT scan of March 18, 2007. FINDINGS: Cardiovascular: 4.5 cm ascending thoracic aortic aneurysm is noted. Normal cardiac size. No pericardial effusion is noted. Mediastinum/Nodes: No enlarged mediastinal or axillary lymph nodes. Thyroid gland, trachea, and esophagus demonstrate no significant findings. Lungs/Pleura: No pneumothorax or pleural effusion is noted. Stable peripheral honeycombing is noted in both lung bases and upper lobes. No acute pulmonary disease is noted. No pulmonary nodule is noted. Upper Abdomen: No acute abnormality. Musculoskeletal: No chest Delpriore mass or suspicious bone lesions identified. IMPRESSION: 4.5 cm ascending thoracic aortic aneurysm. Ascending thoracic aortic aneurysm. Recommend semi-annual imaging followup by CTA or MRA and referral to cardiothoracic surgery if not already obtained. This recommendation follows 2010 ACCF/AHA/AATS/ACR/ASA/SCA/SCAI/SIR/STS/SVM Guidelines for the Diagnosis and Management of Patients With Thoracic Aortic Disease. Circulation.  2010; 121: P379-K240. Peripheral honeycombing is noted in both upper and lower lobes consistent with pulmonary fibrosis. No acute abnormality is noted in the chest. Electronically Signed   By: Marijo Conception, M.D.   On: 11/11/2016 16:13   Dg Chest Port 1 View  Result Date: 10/24/2016 CLINICAL DATA:  Fever.  Hematuria. EXAM: PORTABLE CHEST 1 VIEW COMPARISON:  09/16/2016 FINDINGS: Shallow inspiration. No airspace consolidation. Mild unchanged interstitial coarsening.  IMPRESSION: No consolidation or large effusion. Electronically Signed   By: Andreas Newport M.D.   On: 10/24/2016 22:41   Dg Ankle Right Port  Result Date: 11/09/2016 CLINICAL DATA:  Right ankle pain EXAM: PORTABLE RIGHT ANKLE - 2 VIEW COMPARISON:  None. FINDINGS: There is no evidence of fracture, dislocation, or joint effusion. There is a small plantar calcaneal spur. Soft tissues are unremarkable. IMPRESSION: No acute osseous injury of the right ankle. Electronically Signed   By: Kathreen Devoid   On: 11/09/2016 11:33    Microbiology: Recent Results (from the past 240 hour(s))  Culture, blood (Routine x 2)     Status: None (Preliminary result)   Collection Time: 11/09/16  6:26 AM  Result Value Ref Range Status   Specimen Description BLOOD LEFT ARM  Final   Special Requests   Final    BOTTLES DRAWN AEROBIC AND ANAEROBIC Blood Culture adequate volume   Culture   Final    NO GROWTH 3 DAYS Performed at [facility], [address] [place], [place] [address]    Report Status PENDING  Incomplete  Culture, blood (Routine x 2)     Status: None (Preliminary result)   Collection Time: 11/09/16  6:45 AM  Result Value Ref Range Status   Specimen Description BLOOD RIGHT FOREARM  Final   Special Requests   Final    BOTTLES DRAWN AEROBIC AND ANAEROBIC Blood Culture adequate volume   Culture   Final    NO GROWTH 3 DAYS Performed at [facility], [address], [place], [place] [address]    Report Status PENDING  Incomplete   Urine culture     Status: None   Collection Time: 11/09/16  7:05 AM  Result Value Ref Range Status   Specimen Description URINE, CATHETERIZED  Final   Special Requests NONE  Final   Culture   Final    NO GROWTH Performed at [facility], [place] [address], [place], [place] [address]    Report Status 11/10/2016 FINAL  Final  MRSA PCR Screening     Status: None   Collection Time: 11/10/16 10:03 AM  Result Value Ref Range Status   MRSA by PCR NEGATIVE NEGATIVE Final    Comment:        The GeneXpert MRSA Assay (FDA approved for NASAL specimens only), is one component of a comprehensive MRSA colonization surveillance program. It is not intended to diagnose MRSA infection nor to guide or monitor treatment for MRSA infections.      Labs: Basic Metabolic Panel:  Recent Labs Lab 11/09/16 0626 11/10/16 0651 11/11/16 0622 11/12/16 1051 11/13/16 0803  NA 137 138 143 144 143  K 3.1* 3.2* 3.5 3.5 3.0*  CL 98* 103 109 106 106  CO2 28 29 28 29 28   GLUCOSE 112* 108* 115* 111* 91  BUN 9 7 7 9 7   CREATININE 0.94 0.90 0.82 0.90 0.93  CALCIUM 8.3* 7.9* 8.2* 8.6* 8.5*  MG  --   --  1.9  --   --    Liver Function Tests:  Recent Labs Lab 11/09/16 0626 11/10/16 0651  AST 20 22  ALT 21 20  ALKPHOS 54 52  BILITOT 0.9 0.5  PROT 6.5 5.8*  ALBUMIN 3.0* 2.8*   No results for input(s): LIPASE, AMYLASE in the last 168 hours. No results for input(s): AMMONIA in the last 168 hours. CBC:  Recent Labs Lab 11/09/16 0626 11/10/16 0651 11/11/16 0622 11/12/16 1051 11/13/16  0803  WBC 5.4 3.6* 4.4 3.7* 2.8*  NEUTROABS 4.3  --   --   --   --   HGB 10.5* 9.6* 8.8* 9.2* 9.1*  HCT 30.8* 27.6* 26.1* 27.5* 27.6*  MCV 92.2 91.4 92.2 91.1 90.5  PLT 125* 114* 134* 139* 149*   Cardiac Enzymes: No results for input(s): CKTOTAL, CKMB, CKMBINDEX, TROPONINI in the last 168 hours. BNP: BNP (last 3 results) No results for input(s): BNP in the last 8760 hours.  ProBNP (last 3  results) No results for input(s): PROBNP in the last 8760 hours.  CBG: No results for input(s): GLUCAP in the last 168 hours.     SignedFlorencia Reasons MD, PhD  [facility] 11/13/2016, 11:14 AM

## 2016-11-14 LAB — CULTURE, BLOOD (ROUTINE X 2)
CULTURE: NO GROWTH
Culture: NO GROWTH
SPECIAL REQUESTS: ADEQUATE
Special Requests: ADEQUATE

## 2016-12-21 ENCOUNTER — Telehealth: Payer: Self-pay | Admitting: Family Medicine

## 2016-12-21 NOTE — Telephone Encounter (Signed)
Ricky Boyd asked if needs an Rx for a medical alert bracelet/I advised that I did not think so but if she wanted to ask pharm to see if they cover as I have seen them advertised at [facility] etc. And if it is covered to let us know what we would need to do to help with that/thx dmf

## 2016-12-21 NOTE — Telephone Encounter (Signed)
Copied from [place] #[id number]. Topic: Quick Communication - See Telephone Encounter >> Dec 21, 2016  9:05 AM Robina Ade, Helene Kelp D wrote: CRM for notification. See Telephone encounter for: 12/21/16. Patient's daughter in law would like to talk to provider or CMA about couple question regarding patient alzheimer and his safety. Please call Elmyra Ricks back [phone]

## 2016-12-24 ENCOUNTER — Ambulatory Visit (INDEPENDENT_AMBULATORY_CARE_PROVIDER_SITE_OTHER): Payer: Medicare Other | Admitting: Emergency Medicine

## 2016-12-24 ENCOUNTER — Ambulatory Visit: Payer: Medicare Other | Admitting: Medical

## 2016-12-24 ENCOUNTER — Ambulatory Visit (INDEPENDENT_AMBULATORY_CARE_PROVIDER_SITE_OTHER): Payer: Medicare Other

## 2016-12-24 ENCOUNTER — Encounter: Payer: Self-pay | Admitting: Emergency Medicine

## 2016-12-24 VITALS — BP 100/62 | HR 72 | Temp 97.7°F | Resp 16 | Ht 69.0 in | Wt 180.8 lb

## 2016-12-24 DIAGNOSIS — M25562 Pain in left knee: Secondary | ICD-10-CM

## 2016-12-24 DIAGNOSIS — M25561 Pain in right knee: Secondary | ICD-10-CM

## 2016-12-24 DIAGNOSIS — M170 Bilateral primary osteoarthritis of knee: Secondary | ICD-10-CM | POA: Diagnosis not present

## 2016-12-24 DIAGNOSIS — S8990XA Unspecified injury of unspecified lower leg, initial encounter: Secondary | ICD-10-CM | POA: Insufficient documentation

## 2016-12-24 HISTORY — DX: Bilateral primary osteoarthritis of knee: M17.0

## 2016-12-24 NOTE — Patient Instructions (Addendum)
     IF you received an x-ray today, you will receive an invoice from [facility]. Please contact [facility] at [phone] with questions or concerns regarding your invoice.   IF you received labwork today, you will receive an invoice from [facility]. Please contact [facility] at [phone] with questions or concerns regarding your invoice.   Our billing staff will not be able to assist you with questions regarding bills from these companies.  You will be contacted with the lab results as soon as they are available. The fastest way to get your results is to activate your My Chart account. Instructions are located on the last page of this paperwork. If you have not heard from us regarding the results in 2 weeks, please contact this office.      Arthritis Arthritis means joint pain. It can also mean joint disease. A joint is a place where bones come together. People who have arthritis may have:  Red joints.  Swollen joints.  Stiff joints.  Warm joints.  A fever.  A feeling of being sick.  Follow these instructions at home: Pay attention to any changes in your symptoms. Take these actions to help with your pain and swelling. Medicines  Take over-the-counter and prescription medicines only as told by your doctor.  Do not take aspirin for pain if your doctor says that you may have gout. Activity  Rest your joint if your doctor tells you to.  Avoid activities that make the pain worse.  Exercise your joint regularly as told by your doctor. Try doing exercises like: ? Swimming. ? Water aerobics. ? Biking. ? Walking. Joint Care   If your joint is swollen, keep it raised (elevated) if told by your doctor.  If your joint feels stiff in the morning, try taking a warm shower.  If you have diabetes, do not apply heat without asking your doctor.  If told, apply heat to the joint: ? Put a towel between the joint and the hot pack or heating pad. ? Leave the  heat on the area for 20-30 minutes.  If told, apply ice to the joint: ? Put ice in a plastic bag. ? Place a towel between your skin and the bag. ? Leave the ice on for 20 minutes, 2-3 times per day.  Keep all follow-up visits as told by your doctor. Contact a doctor if:  The pain gets worse.  You have a fever. Get help right away if:  You have very bad pain in your joint.  You have swelling in your joint.  Your joint is red.  Many joints become painful and swollen.  You have very bad back pain.  Your leg is very weak.  You cannot control your pee (urine) or poop (stool). This information is not intended to replace advice given to you by your health care provider. Make sure you discuss any questions you have with your health care provider. Document Released: 05/06/2009 Document Revised: 07/18/2015 Document Reviewed: 05/07/2014 Elsevier Interactive Patient Education  2018 [employer]  

## 2016-12-24 NOTE — Progress Notes (Signed)
Ricky Boyd 76 y.o.   Chief Complaint  Patient presents with  . patient is not communicating reason for office visit    per patient to Dr Mitchel Honour - fell injury to both knee x 2 weeks    HISTORY OF PRESENT ILLNESS: This is a 76 y.o. male with h/o dementia fell 2 weeks ago and injured both knees; no other injuries; c/o pain to both knees; no other complaints at this time.  HPI   Prior to Admission medications   Medication Sig Start Date End Date Taking? Authorizing Provider  acetaminophen (TYLENOL) 500 MG tablet Take 500 mg by mouth every 6 (six) hours as needed for mild pain.    Yes [provider]  albuterol (PROVENTIL) (2.5 MG/3ML) 0.083% nebulizer solution Take 3 mLs (2.5 mg total) by nebulization every 6 (six) hours as needed for wheezing or shortness of breath. Patient not taking: Reported on 12/24/2016 10/30/16   Georgette Shell, MD  colchicine 0.6 MG tablet Take 1 tablet (0.6 mg total) by mouth daily. When having a gout attack, take 2 tablets in day 1 then daily for 6 more days Patient not taking: Reported on 11/09/2016 09/20/16 09/20/17  Caren Griffins, MD  Memantine HCl-Donepezil HCl (NAMZARIC) 28-10 MG CP24 Take 28 mg by mouth daily. 02/27/16   Lucille Passy, MD  predniSONE (DELTASONE) 10 MG tablet Label  & dispense according to the schedule below. 10 Pills PO for 3 days then, 8 Pills PO for 3 days, 6 Pills PO for 3 days, 4 Pills PO for 3 days, 2 Pills PO for 3 days, 1 Pills PO for 3 days, 1/2 Pill  PO for 3 days then STOP. 11/13/16   Florencia Reasons, MD  QUEtiapine (SEROQUEL) 25 MG tablet Take 0.5 tablets (12.5 mg total) by mouth at bedtime. 11/02/16   Dessa Phi Chahn-Yang, DO  sennosides-docusate sodium (SENOKOT-S) 8.6-50 MG tablet Take 1 tablet by mouth daily.    [provider]  tamsulosin (FLOMAX) 0.4 MG CAPS capsule Take 1 capsule (0.4 mg total) by mouth daily after supper. 09/18/16   Caren Griffins, MD  traMADol (ULTRAM) 50 MG tablet Take 50 mg by mouth  every 8 (eight) hours as needed (pain).    [provider]    Allergies  Allergen Reactions  . Hydrocodone Other (See Comments)    Reaction:  Hallucinations  . Indocin [Indomethacin] Other (See Comments)    Reaction:  Confusion   . Amoxicillin-Pot Clavulanate Other (See Comments)    Reaction:  Nightmares Has patient had a PCN reaction causing immediate rash, facial/tongue/throat swelling, SOB or lightheadedness with hypotension: No Has patient had a PCN reaction causing severe rash involving mucus membranes or skin necrosis: No Has patient had a PCN reaction that required hospitalization: No Has patient had a PCN reaction occurring within the last 10 years: Yes If all of the above answers are "NO", then may proceed with Cephalosporin use.  . Erythromycin Ethylsuccinate Other (See Comments)    Reaction:  Eye irritation     Patient Active Problem List   Diagnosis Date Noted  . Pneumonia 11/09/2016  . Normocytic anemia 10/28/2016  . Enterococcal bacteremia 10/28/2016  . Bacteremia due to Enterobacter species 10/28/2016  . Sepsis secondary to UTI ([place]) 10/25/2016  . Hyperglycemia 10/25/2016  . Thrombocytopenia ([place]) 10/25/2016  . Hematuria 10/24/2016  . Urinary retention 10/08/2016  . Acute encephalopathy 09/16/2016  . Dementia 09/16/2016  . Urinary tract infection without hematuria   . Dehydration   .  Malignant neoplasm of prostate ([place]) 04/23/2016  . Erectile dysfunction 07/23/2015  . 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 Elevated PSA, less than 10 ng/ml 06/27/2012  . GOUT 12/26/2008  . 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 History of colonic polyps 10/28/2006  . 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 Arthritis   . Dementia 09/16/2016  . Gout    usually knees, occasionally feet  . Hearing aid worn   . Prostate cancer [facility])     Past Surgical History:  Procedure Laterality Date  . COLONOSCOPY     . HEMORROIDECTOMY    . PROSTATE BIOPSY    . TONSILLECTOMY AND ADENOIDECTOMY      Social History   Social History  . Marital status: Single    Spouse name: N/A  . Number of children: 3  . Years of education: Master's   Occupational History  . retired    Social History Main Topics  . Smoking status: Former Smoker    Packs/day: 0.25    Years: 10.00    Types: Cigarettes    Quit date: 02/23/1958  . Smokeless tobacco: Former Systems developer     Comment: quit 1963  . Alcohol use No     Comment: h/o occasional use  . Drug use: No  . Sexual activity: Yes   Other Topics Concern  . Not on file   Social History Narrative   Desires CPR.      Would desire life support and and possibly feeding tube.      Patient currently lives alone- 2nd wife killed herself. Lives in one story house.    Patient is left handed.   Patient has a Scientist, water quality.     Family History  Problem Relation Age of Onset  . Diverticulitis Father   . Depression Brother   . Cancer Paternal Uncle   . Colon cancer Neg Hx      Review of Systems  Unable to perform ROS: Dementia   Vitals:   12/24/16 1417  BP: 100/62  Pulse: 72  Resp: 16  Temp: 97.7 F (36.5 C)  SpO2: 94%     Physical Exam  Constitutional: He appears well-developed and well-nourished.  HENT:  Head: Normocephalic and atraumatic.  Nose: Nose normal.  Mouth/Throat: Oropharynx is clear and moist.  Eyes: Pupils are equal, round, and reactive to light. Conjunctivae and EOM are normal.  Neck: Normal range of motion. Neck supple.  Cardiovascular: Normal rate, regular rhythm and normal heart sounds.   Pulmonary/Chest: Effort normal and breath sounds normal. He exhibits no tenderness.  Abdominal: Soft. He exhibits no distension. There is no tenderness.  Musculoskeletal:  Knees: old bruising L>R; swelling and tenderness to right knee; stable with FROM; able to slowly ambulate.  Neurological: He is alert.  Skin: Skin is warm and dry. Capillary  refill takes less than 2 seconds.  Psychiatric: He has a normal mood and affect. His behavior is normal.  Vitals reviewed.   Dg Knee Complete 4 Views Left  Result Date: 12/24/2016 CLINICAL DATA:  Acute pain both knees, recent injury EXAM: LEFT KNEE - COMPLETE 4+ VIEW COMPARISON:  None. FINDINGS: For age there is very little degenerative joint disease of the left knee. Minimal spurring is present from the patella. No fracture seen and no joint effusion is noted. IMPRESSION: Very minimal degenerative joint disease for age. No acute abnormality. Electronically Signed   By: Windy Canny.D.  On: 12/24/2016 15:25   Dg Knee Complete 4 Views Right  Result Date: 12/24/2016 CLINICAL DATA:  Bilateral knee pain, injury EXAM: RIGHT KNEE - COMPLETE 4+ VIEW COMPARISON:  None. FINDINGS: Standing views of the knees do show slight loss of medial compartment joint space bilaterally. No acute fracture is seen. No joint effusion is noted. IMPRESSION: Mild degenerative change with some loss of medial compartment joint space. No acute fracture. Electronically Signed   By: Ivar Drape M.D.   On: 12/24/2016 15:26    ASSESSMENT & PLAN: Ricky Boyd was seen today for patient is not communicating reason for office visit.  Diagnoses and all orders for this visit:  Acute pain of both knees -     DG Knee Complete 4 Views Left; Future -     DG Knee Complete 4 Views Right; Future  Knee injury, unspecified laterality, initial encounter -     DG Knee Complete 4 Views Left; Future -     DG Knee Complete 4 Views Right; Future  Osteoarthritis of both knees, unspecified osteoarthritis type    Patient Instructions       IF you received an x-ray today, you will receive an invoice from [facility]. Please contact [facility] at [phone] with questions or concerns regarding your invoice.   IF you received labwork today, you will receive an invoice from [facility]. Please contact [facility] at  [phone] with questions or concerns regarding your invoice.   Our billing staff will not be able to assist you with questions regarding bills from these companies.  You will be contacted with the lab results as soon as they are available. The fastest way to get your results is to activate your My Chart account. Instructions are located on the last page of this paperwork. If you have not heard from [place] regarding the results in 2 weeks, please contact this office.     Arthritis Arthritis means joint pain. It can also mean joint disease. A joint is a place where bones come together. People who have arthritis may have:  Red joints.  Swollen joints.  Stiff joints.  Warm joints.  A fever.  A feeling of being sick.  Follow these instructions at home: Pay attention to any changes in your symptoms. Take these actions to help with your pain and swelling. Medicines  Take over-the-counter and prescription medicines only as told by your doctor.  Do not take aspirin for pain if your doctor says that you may have gout. Activity  Rest your joint if your doctor tells you to.  Avoid activities that make the pain worse.  Exercise your joint regularly as told by your doctor. Try doing exercises like: ? Swimming. ? Water aerobics. ? Biking. ? Walking. Joint Care   If your joint is swollen, keep it raised (elevated) if told by your doctor.  If your joint feels stiff in the morning, try taking a warm shower.  If you have diabetes, do not apply heat without asking your doctor.  If told, apply heat to the joint: ? Put a towel between the joint and the hot pack or heating pad. ? Leave the heat on the area for 20-30 minutes.  If told, apply ice to the joint: ? Put ice in a plastic bag. ? Place a towel between your skin and the bag. ? Leave the ice on for 20 minutes, 2-3 times per day.  Keep all follow-up visits as told by your doctor. Contact a doctor if:  The pain  gets  worse.  You have a fever. Get help right away if:  You have very bad pain in your joint.  You have swelling in your joint.  Your joint is red.  Many joints become painful and swollen.  You have very bad back pain.  Your leg is very weak.  You cannot control your pee (urine) or poop (stool). This information is not intended to replace advice given to you by your health care provider. Make sure you discuss any questions you have with your health care provider. Document Released: 05/06/2009 Document Revised: 07/18/2015 Document Reviewed: 05/07/2014 Elsevier Interactive Patient Education  2018 [employer].      Agustina Caroli, MD [facility]

## 2017-01-05 ENCOUNTER — Ambulatory Visit: Payer: Self-pay | Admitting: *Deleted

## 2017-01-05 NOTE — Telephone Encounter (Signed)
Daughter in law Ricky Boyd called for Ricky Boyd stating he was having the signs of a urinary tract infection.   "He has had these before that landed him in the hospital with the infection going into his bloodstream".  He is having the same symptoms.  He is hallucinating but it's hard to know how much because he has Alzheimer's.  He is incontinent of urine, c/o burning. I made an appt for him with Dr. Lorelei Pont on Jan 06, 2017 at 10:00am.  Reason for Disposition . [1] Can't control passage of urine (i.e., urinary incontinence) AND [2] new onset (< 2 weeks) or worsening  Answer Assessment - Initial Assessment Questions 1. SYMPTOM: "What's the main symptom you're concerned about?" (e.g., frequency, incontinence)     Trouble urinating, incontinence, hallucinations this happened last time he ended up in the hospital with a UTI that got into his blood. 2. ONSET: "When did the  ________  start?"     Started 2-3 days ago 3. PAIN: "Is there any pain?" If so, ask: "How bad is it?" (Scale: 1-10; mild, moderate, severe)     Sometimes burns.  Going more often than normal.   4. CAUSE: "What do you think is causing the symptoms?"     UTI that he has had before that has put him in the hospital 5. OTHER SYMPTOMS: "Do you have any other symptoms?" (e.g., fever, flank pain, blood in urine, pain with urination)     No 6. PREGNANCY: "Is there any chance you are pregnant?" "When was your last menstrual period?"     N/A  Protocols used: URINARY [facility]

## 2017-01-06 ENCOUNTER — Ambulatory Visit: Payer: Medicare Other | Admitting: Family Medicine

## 2017-01-27 ENCOUNTER — Telehealth: Payer: Self-pay

## 2017-01-27 ENCOUNTER — Encounter: Payer: Self-pay | Admitting: Family Medicine

## 2017-01-27 ENCOUNTER — Ambulatory Visit (INDEPENDENT_AMBULATORY_CARE_PROVIDER_SITE_OTHER): Payer: Medicare Other | Admitting: Family Medicine

## 2017-01-27 VITALS — BP 124/68 | HR 66 | Temp 97.8°F

## 2017-01-27 DIAGNOSIS — R300 Dysuria: Secondary | ICD-10-CM | POA: Diagnosis not present

## 2017-01-27 DIAGNOSIS — F0390 Unspecified dementia without behavioral disturbance: Secondary | ICD-10-CM | POA: Diagnosis not present

## 2017-01-27 LAB — POC URINALSYSI DIPSTICK (AUTOMATED)
Bilirubin, UA: NEGATIVE
GLUCOSE UA: NEGATIVE
Ketones, UA: NEGATIVE
Nitrite, UA: NEGATIVE
PH UA: 6 (ref 5.0–8.0)
SPEC GRAV UA: 1.02 (ref 1.010–1.025)
UROBILINOGEN UA: 0.2 U/dL

## 2017-01-27 MED ORDER — CEPHALEXIN 500 MG PO CAPS: 500.0000 mg | ORAL_CAPSULE | Freq: Two times a day (BID) | ORAL | 0 refills | Status: DC

## 2017-01-27 NOTE — Progress Notes (Addendum)
BP 124/68 (BP Location: Left Arm, Patient Position: Sitting, Cuff Size: Normal)   Pulse 66   Temp 97.8 F (36.6 C) (Oral)   SpO2 98%    CC: ?UTI Subjective:    Patient ID: Ricky Boyd, male    DOB: 09-23-1940, 76 y.o.   MRN: [id number]  HPI: Ricky Boyd is a 76 y.o. male presenting on 01/27/2017 for Dysuria (Has had trouble trying to urinate and painful when going for last few days. Also, urine has strong foul ordor. H/o UTIs)   Here with caregiver - she is a new caregiver.   Pt of Dr Hulen Shouts presents as walk in with concerns over recurrent UTI. H/o alzheimer's dementia on namzaric, caregiver helps give story. Several day history of foul smelling and cloudy urine, as well as pain with voiding. Lower abdominal pain when he woke up this morning. Trouble making urine and trouble completely emptying. Worse confusion than normal.   H/o UTI, latest probably a few months ago.  Not on prednisone. H/o ILD.   No fevers/chills, nausea/vomiting, back or flank pain. No blood in urine. No increased urgency and frequency.   Relevant past medical, surgical, family and social history reviewed and updated as indicated. Interim medical history since our last visit reviewed. Allergies and medications reviewed and updated. Outpatient Medications Prior to Visit  Medication Sig Dispense Refill  . acetaminophen (TYLENOL) 500 MG tablet Take 500 mg by mouth every 6 (six) hours as needed for mild pain.     Marland Kitchen albuterol (PROVENTIL) (2.5 MG/3ML) 0.083% nebulizer solution Take 3 mLs (2.5 mg total) by nebulization every 6 (six) hours as needed for wheezing or shortness of breath. 75 mL 12  . colchicine 0.6 MG tablet Take 1 tablet (0.6 mg total) by mouth daily. When having a gout attack, take 2 tablets in day 1 then daily for 6 more days 8 tablet 2  . Memantine HCl-Donepezil HCl (NAMZARIC) 28-10 MG CP24 Take 28 mg by mouth daily. 30 capsule 5  . predniSONE (DELTASONE) 10 MG tablet Label  & dispense according to the  schedule below. 10 Pills PO for 3 days then, 8 Pills PO for 3 days, 6 Pills PO for 3 days, 4 Pills PO for 3 days, 2 Pills PO for 3 days, 1 Pills PO for 3 days, 1/2 Pill  PO for 3 days then STOP. 95 tablet 0  . QUEtiapine (SEROQUEL) 25 MG tablet Take 0.5 tablets (12.5 mg total) by mouth at bedtime. 30 tablet 0  . sennosides-docusate sodium (SENOKOT-S) 8.6-50 MG tablet Take 1 tablet by mouth daily.    . tamsulosin (FLOMAX) 0.4 MG CAPS capsule Take 1 capsule (0.4 mg total) by mouth daily after supper. 30 capsule   . traMADol (ULTRAM) 50 MG tablet Take 50 mg by mouth every 8 (eight) hours as needed (pain).     No facility-administered medications prior to visit.      Per HPI unless specifically indicated in ROS section below Review of Systems     Objective:    BP 124/68 (BP Location: Left Arm, Patient Position: Sitting, Cuff Size: Normal)   Pulse 66   Temp 97.8 F (36.6 C) (Oral)   SpO2 98%   Wt Readings from Last 3 Encounters:  12/24/16 180 lb 12.8 oz (82 kg)  11/09/16 182 lb (82.6 kg)  10/27/16 182 lb 5.1 oz (82.7 kg)    Physical Exam  Constitutional: He appears well-developed and well-nourished. No distress.  HENT:  Mouth/Throat: Oropharynx is clear and  moist. No oropharyngeal exudate.  Eyes: Conjunctivae and EOM are normal. Pupils are equal, round, and reactive to light. No scleral icterus.  Cardiovascular: Normal rate, regular rhythm, normal heart sounds and intact distal pulses.  No murmur heard. Pulmonary/Chest: Effort normal and breath sounds normal. No respiratory distress. He has no wheezes. He has no rales.  Bibasilar crackles  Abdominal: Soft. Normal appearance and bowel sounds are normal. He exhibits no distension and no mass. There is no hepatosplenomegaly. There is no tenderness. There is no rigidity, no rebound, no guarding, no CVA tenderness and negative Murphy's sign.  Musculoskeletal: He exhibits no edema.  Skin: Skin is warm and dry. No rash noted.  Psychiatric:  He has a normal mood and affect.  Nursing note and vitals reviewed.  Results for orders placed or performed in visit on 01/27/17  POCT Urinalysis Dipstick (Automated)  Result Value Ref Range   Color, UA yellow    Clarity, UA cloudy    Glucose, UA negative    Bilirubin, UA negative    Ketones, UA negative    Spec Grav, UA 1.020 1.010 - 1.025   Blood, UA 1+    pH, UA 6.0 5.0 - 8.0   Protein, UA trace    Urobilinogen, UA 0.2 0.2 or 1.0 E.U./dL   Nitrite, UA negative    Leukocytes, UA Large (3+) (A) Negative    Lab Results  Component Value Date   CREATININE 0.93 11/13/2016       Assessment & Plan:   Problem List Items Addressed This Visit    Dementia   Dysuria - Primary    H/o prior UTI as well as sepsis due to UTI. After several cups of water pt able to provide specimen - see below.  Rx keflex 500mg  bid x 1 wk.  Further supportive care as per instructions. UCx sent.       Relevant Orders   Urine Culture       Follow up plan: Return if symptoms worsen or fail to improve.  Ria Bush, MD

## 2017-01-27 NOTE — Addendum Note (Signed)
Addended by: Brenton Grills on: 46/03/8636 17:71 PM   Modules accepted: Orders

## 2017-01-27 NOTE — Telephone Encounter (Signed)
Pt walked in with care giver; for few days pt has foul smelling and cloudy urine with pain upon urination.no abdpain, no fever and no more confusion than usual. Offered appts at [facility] in [place] and care giver does not want to go to [place] and [place] has no appts this afternoon. Care giver wanted to wait for possible cancellation; Dr Darnell Level had cancellation at 3pm and agreed to see pt; pts care giver will let pts son know that Dr Deborra Medina is at [facility] and if wants to establish with new provider at [facility] to call and schedule appt.

## 2017-01-27 NOTE — Assessment & Plan Note (Signed)
H/o prior UTI as well as sepsis due to UTI. After several cups of water pt able to provide specimen - see below.  Rx keflex 500mg  bid x 1 wk.  Further supportive care as per instructions. UCx sent.

## 2017-01-27 NOTE — Patient Instructions (Addendum)
This sounds like UTI - try to collect specimen prior to starting keflex antibiotic - then start medicine twice daily for 7 days.  Push fluids (water, cranberry juice), rest, tylenol for discomfort as needed.  Let us know if not improving with treatment.   Urinary Tract Infection, Adult A urinary tract infection (UTI) is an infection of any part of the urinary tract. The urinary tract includes the:  Kidneys.  Ureters.  Bladder.  Urethra.  These organs make, store, and get rid of pee (urine) in the body. Follow these instructions at home:  Take over-the-counter and prescription medicines only as told by your doctor.  If you were prescribed an antibiotic medicine, take it as told by your doctor. Do not stop taking the antibiotic even if you start to feel better.  Avoid the following drinks: ? Alcohol. ? Caffeine. ? Tea. ? Carbonated drinks.  Drink enough fluid to keep your pee clear or pale yellow.  Keep all follow-up visits as told by your doctor. This is important.  Make sure to: ? Empty your bladder often and completely. Do not to hold pee for long periods of time. ? Empty your bladder before and after sex. ? Wipe from front to back after a bowel movement if you are male. Use each tissue one time when you wipe. Contact a doctor if:  You have back pain.  You have a fever.  You feel sick to your stomach (nauseous).  You throw up (vomit).  Your symptoms do not get better after 3 days.  Your symptoms go away and then come back. Get help right away if:  You have very bad back pain.  You have very bad lower belly (abdominal) pain.  You are throwing up and cannot keep down any medicines or water. This information is not intended to replace advice given to you by your health care provider. Make sure you discuss any questions you have with your health care provider. Document Released: 07/29/2007 Document Revised: 07/18/2015 Document Reviewed: 12/31/2014 Elsevier  Interactive Patient Education  [employer].

## 2017-01-28 LAB — URINE CULTURE
MICRO NUMBER: 81367995
SPECIMEN QUALITY:: ADEQUATE

## 2017-02-18 ENCOUNTER — Ambulatory Visit: Payer: Medicare Other | Admitting: Neurology

## 2017-03-08 ENCOUNTER — Other Ambulatory Visit: Payer: Self-pay | Admitting: Family Medicine

## 2017-03-24 ENCOUNTER — Ambulatory Visit: Payer: Medicare Other | Admitting: Family Medicine

## 2017-03-24 ENCOUNTER — Encounter: Payer: Self-pay | Admitting: Family Medicine

## 2017-03-24 VITALS — BP 118/78 | HR 83 | Temp 97.6°F | Wt 190.8 lb

## 2017-03-24 DIAGNOSIS — M25511 Pain in right shoulder: Secondary | ICD-10-CM | POA: Diagnosis not present

## 2017-03-24 DIAGNOSIS — R350 Frequency of micturition: Secondary | ICD-10-CM | POA: Diagnosis not present

## 2017-03-24 DIAGNOSIS — N3090 Cystitis, unspecified without hematuria: Secondary | ICD-10-CM | POA: Diagnosis not present

## 2017-03-24 LAB — POCT URINALYSIS DIPSTICK
Bilirubin, UA: NEGATIVE
GLUCOSE UA: NEGATIVE
Ketones, UA: NEGATIVE
NITRITE UA: NEGATIVE
Spec Grav, UA: >=1.03 — AB (ref 1.010–1.025)
Urobilinogen, UA: 0.2 E.U./dL
pH, UA: 6 (ref 5.0–8.0)

## 2017-03-24 MED ORDER — CEPHALEXIN 500 MG PO CAPS: 500.0000 mg | ORAL_CAPSULE | Freq: Two times a day (BID) | ORAL | 0 refills | Status: DC

## 2017-03-24 NOTE — Progress Notes (Signed)
Subjective:    Patient ID: Ricky Boyd, male    DOB: 11/26/1940, 77 y.o.   MRN: [id number]  HPI This is a 77 yo male, accompanied by two of his caregivers, who presents today with urinary frequency and dark urine. Urine with strong smell. Has been having low back pain and rectal pain. Care givers report some skin irritation of buttocks, wears incontinence briefs all of the time.   Fell several weeks ago. Has had some shoulder pain. Was trying to climb out his bedroom window. Caregivers applying voltaren gel with some relief. Sleeping well.   Patient denies nausea, vomiting, abdominal pain, chest pain or SOB. Caregivers report good fluid intake, good appetite, no diarrhea, regular BMs, no fever.   Past Medical History:  Diagnosis Date  . Arthritis   . Dementia 09/16/2016  . Gout    usually knees, occasionally feet  . Hearing aid worn   . Prostate cancer [facility])    Past Surgical History:  Procedure Laterality Date  . COLONOSCOPY    . HEMORROIDECTOMY    . PROSTATE BIOPSY    . TONSILLECTOMY AND ADENOIDECTOMY     Family History  Problem Relation Age of Onset  . Diverticulitis Father   . Depression Brother   . Cancer Paternal Uncle   . Colon cancer Neg Hx    Social History   Tobacco Use  . Smoking status: Former Smoker    Packs/day: 0.25    Years: 10.00    Pack years: 2.50    Types: Cigarettes    Last attempt to quit: 02/23/1958    Years since quitting: 59.1  . Smokeless tobacco: Former Systems developer  . Tobacco comment: quit 1963  Substance Use Topics  . Alcohol use: No    Alcohol/week: 3.6 oz    Types: 6 Cans of beer per week    Comment: h/o occasional use  . Drug use: No      Review of Systems Per HPI    Objective:   Physical Exam  Constitutional: He is oriented to person, place, and time. He appears well-developed and well-nourished. No distress.  HENT:  Head: Normocephalic and atraumatic.  Eyes: Conjunctivae are normal.  Cardiovascular: Normal rate, regular rhythm  and normal heart sounds.  Pulmonary/Chest: Effort normal and breath sounds normal.  Abdominal: Soft. Bowel sounds are normal. He exhibits no distension. There is no tenderness. There is no rebound, no guarding and no CVA tenderness.  Genitourinary: Rectum normal.  Genitourinary Comments: Right buttock with mild erythema, skin intact.   Musculoskeletal: He exhibits no edema.       Right shoulder: He exhibits decreased range of motion (mildly) and tenderness (mild). He exhibits no bony tenderness.  Neurological: He is alert and oriented to person, place, and time.  Skin: Skin is warm and dry. He is not diaphoretic.  Psychiatric: He has a normal mood and affect. His behavior is normal. Judgment and thought content normal.  Vitals reviewed.     BP 118/78 (BP Location: Left Arm, Patient Position: Sitting, Cuff Size: Normal)   Pulse 83   Temp 97.6 F (36.4 C) (Oral)   Wt 190 lb 12 oz (86.5 kg)   SpO2 98%   BMI 28.17 kg/m  Wt Readings from Last 3 Encounters:  03/24/17 190 lb 12 oz (86.5 kg)  12/24/16 180 lb 12.8 oz (82 kg)  11/09/16 182 lb (82.6 kg)   Results for orders placed or performed in visit on 03/24/17  Urinalysis Dipstick  Result Value Ref  Range   Color, UA dark yellow    Clarity, UA cloudy    Glucose, UA neg    Bilirubin, UA neg    Ketones, UA neg    Spec Grav, UA >=1.030 (A) 1.010 - 1.025   Blood, UA 1+    pH, UA 6.0 5.0 - 8.0   Protein, UA 1+    Urobilinogen, UA 0.2 0.2 or 1.0 E.U./dL   Nitrite, UA neg    Leukocytes, UA Large (3+) (A) Negative   Appearance     Odor strong odor per patient        Assessment & Plan:  1. Urinary frequency - discussed barrier cream and frequently changing incontinence briefs - Urinalysis Dipstick - Urine Culture  2. Cystitis - Provided written and verbal information regarding diagnosis and treatment. - RTC precautions reviewed - cephALEXin (KEFLEX) 500 MG capsule; Take 1 capsule (500 mg total) by mouth 2 (two) times daily.   Dispense: 14 capsule; Refill: 0  3. Acute pain of right shoulder - improving, continue voltaren gel, can apply heat prn and encouraged gentle ROM several times a day   Clarene Reamer, FNP-BC  [facility], [facility]  03/26/2017 6:11 AM

## 2017-03-24 NOTE — Patient Instructions (Signed)
It was good to meet you all today  Please follow up in 3 months for a complete physical    Urinary Tract Infection, Adult A urinary tract infection (UTI) is an infection of any part of the urinary tract. The urinary tract includes the:  Kidneys.  Ureters.  Bladder.  Urethra.  These organs make, store, and get rid of pee (urine) in the body. Follow these instructions at home:  Take over-the-counter and prescription medicines only as told by your doctor.  If you were prescribed an antibiotic medicine, take it as told by your doctor. Do not stop taking the antibiotic even if you start to feel better.  Avoid the following drinks: ? Alcohol. ? Caffeine. ? Tea. ? Carbonated drinks.  Drink enough fluid to keep your pee clear or pale yellow.  Keep all follow-up visits as told by your doctor. This is important.  Make sure to: ? Empty your bladder often and completely. Do not to hold pee for long periods of time. ? Empty your bladder before and after sex. ? Wipe from front to back after a bowel movement if you are male. Use each tissue one time when you wipe. Contact a doctor if:  You have back pain.  You have a fever.  You feel sick to your stomach (nauseous).  You throw up (vomit).  Your symptoms do not get better after 3 days.  Your symptoms go away and then come back. Get help right away if:  You have very bad back pain.  You have very bad lower belly (abdominal) pain.  You are throwing up and cannot keep down any medicines or water. This information is not intended to replace advice given to you by your health care provider. Make sure you discuss any questions you have with your health care provider. Document Released: 07/29/2007 Document Revised: 07/18/2015 Document Reviewed: 12/31/2014 Elsevier Interactive Patient Education  [employer].

## 2017-03-26 ENCOUNTER — Encounter: Payer: Self-pay | Admitting: Family Medicine

## 2017-03-26 LAB — URINE CULTURE
MICRO NUMBER: 90128139
SPECIMEN QUALITY: ADEQUATE

## 2017-05-26 ENCOUNTER — Ambulatory Visit (INDEPENDENT_AMBULATORY_CARE_PROVIDER_SITE_OTHER): Payer: Medicare Other | Admitting: Family Medicine

## 2017-05-26 ENCOUNTER — Encounter: Payer: Self-pay | Admitting: Family Medicine

## 2017-05-26 VITALS — BP 124/78 | HR 54 | Temp 97.6°F | Wt 194.8 lb

## 2017-05-26 DIAGNOSIS — M25511 Pain in right shoulder: Secondary | ICD-10-CM

## 2017-05-26 DIAGNOSIS — F0390 Unspecified dementia without behavioral disturbance: Secondary | ICD-10-CM

## 2017-05-26 DIAGNOSIS — G479 Sleep disorder, unspecified: Secondary | ICD-10-CM

## 2017-05-26 DIAGNOSIS — G8929 Other chronic pain: Secondary | ICD-10-CM | POA: Diagnosis not present

## 2017-05-26 DIAGNOSIS — M25512 Pain in left shoulder: Secondary | ICD-10-CM | POA: Diagnosis not present

## 2017-05-26 MED ORDER — MIRTAZAPINE 15 MG PO TABS: 15.0000 mg | ORAL_TABLET | Freq: Every day | ORAL | 3 refills | Status: DC

## 2017-05-26 NOTE — Patient Instructions (Addendum)
Please schedule a follow up with Dr. Tomi Likens  I will see you for your physical in May, please get in touch if you need anything sooner. '  Establish a daytime routine and minimize daytime sleeping to less than 30 minutes  Try Silver Sneakers/ chair exercise classes  Gentle range of motion for shoulders Extra strength Tylenol 2 tablets every 8 to 12 hours for pain, can also try ibuprofen 2 tablets every 8 to 12 hours

## 2017-05-26 NOTE — Progress Notes (Signed)
Subjective:    Patient ID: Ricky Boyd, male    DOB: 1940-04-05, 77 y.o.   MRN: [id number]  HPI This is a 77 yo male, accompanied by his caregiver, Nobie Putnam (who is a retired Corporate treasurer) and his son. He presents today with worsening agitation and confusion. He has someone with him around the clock.  He has caregivers during the day and his son spends the night. They have several concerns regarding his behavior today.  They are requesting medication to help manage his sundowning and bouts of combativeness.  They are requesting Remeron, citalopram and a benzodiazepine.  These medication recommendations are from a physician friend of his caregiver Joellen Jersey. Biggest issue is sundowning.  If Ricky Boyd is home most of the day he will nap.  He will then be restless at night.  He will be asked to be taken "home."  His son has noted that he can put him in the car and drive around for a little bit which will settle Ricky Boyd.  He does not try to leave the house, except for 1 incident in the last couple of months where he tried to climb out the window.  The daytime caregivers do not always keep him on a very regimented schedule.  He does get out of the house most days and seems to enjoy activities.  He goes to Therapist, music at [employer], out to eat.  He has an occasional episode of becoming frustrated and briefly agitated. His appetite is good.  Today, Ricky Boyd reports that he has some pain in his shoulders and in his right knee.  These have been chronic regions of pain for him.  His caregivers give him Tylenol occasionally.  He enjoys doing some chores around the house but does not get regular exercise.  Was previously followed by Dr. Tomi Likens but has not seen him in a while. Past Medical History:  Diagnosis Date  . Arthritis   . Dementia 09/16/2016  . Gout    usually knees, occasionally feet  . Hearing aid worn   . Prostate cancer [facility])    Past Surgical History:  Procedure Laterality Date  . COLONOSCOPY    .  HEMORROIDECTOMY    . PROSTATE BIOPSY    . TONSILLECTOMY AND ADENOIDECTOMY     Family History  Problem Relation Age of Onset  . Diverticulitis Father   . Depression Brother   . Cancer Paternal Uncle   . Colon cancer Neg Hx    Social History   Tobacco Use  . Smoking status: Former Smoker    Packs/day: 0.25    Years: 10.00    Pack years: 2.50    Types: Cigarettes    Last attempt to quit: 02/23/1958    Years since quitting: 59.2  . Smokeless tobacco: Former Systems developer  . Tobacco comment: quit 1963  Substance Use Topics  . Alcohol use: No    Alcohol/week: 3.6 oz    Types: 6 Cans of beer per week    Comment: h/o occasional use  . Drug use: No      Review of Systems Per HPI    Objective:   Physical Exam  Constitutional: He appears well-developed and well-nourished. No distress.  HENT:  Head: Normocephalic and atraumatic.  Mouth/Throat: Oropharynx is clear and moist.  Eyes: Conjunctivae are normal.  Cardiovascular: Normal rate, regular rhythm and normal heart sounds.  Pulmonary/Chest: Effort normal and breath sounds normal.  Musculoskeletal: He exhibits no edema.  Bilateral shoulders with decreased ROM. UE strength 5/5.  Neurological: He is alert.  He answers questions appropriately when asked.  Does not spontaneously participate in conversation.  Skin: Skin is warm and dry. He is not diaphoretic.  Psychiatric: He has a normal mood and affect. His behavior is normal.  Vitals reviewed.        BP 124/78   Pulse (!) 54   Temp 97.6 F (36.4 C) (Oral)   Wt 194 lb 12 oz (88.3 kg)   SpO2 96%   BMI 28.76 kg/m  Wt Readings from Last 3 Encounters:  05/26/17 194 lb 12 oz (88.3 kg)  03/24/17 190 lb 12 oz (86.5 kg)  12/24/16 180 lb 12.8 oz (82 kg)    Assessment & Plan:  1. Dementia without behavioral disturbance, unspecified dementia type -Discussed progressive nature of illness with son and caregiver; provided them with MOST form to review and can complete at follow-up  office visit. -Emphasized that #1 priority for Ricky Boyd is his safety, making sure that fall hazards are eliminated and that he is not left alone -Discussed medications and will add mirtazapine to help improve sleep wake cycle.  Discussed side effects. -Discussed risks of benzodiazepines and will not use at this time -Encouraged them to establish wish regular daytime and bedtime routines for Ricky Boyd to help minimize daytime sleeping to improve nighttime sleep. -Discussed techniques to use if patient becomes agitated including distraction, removing him from situation -Encouraged him to follow-up with Dr. Tomi Likens  2. Sleep disturbance - mirtazapine (REMERON) 15 MG tablet; Take 1 tablet (15 mg total) by mouth at bedtime.  Dispense: 30 tablet; Refill: 3  4. Bilateral shoulder pain, chronic - discussed use of OTC analgesics, heat - encouraged gentle ROM daily, senior exercises at [facility] (has membership)  -Follow-up next month as scheduled - Over 30 minutes were spent face-to-face with the patient during this encounter and >50% of that time was spent on counseling and coordination of care  Clarene Reamer, FNP-BC  [place] [facility], [place]  05/29/2017 9:24 AM

## 2017-05-29 ENCOUNTER — Encounter: Payer: Self-pay | Admitting: Family Medicine

## 2017-06-23 ENCOUNTER — Encounter: Payer: Medicare Other | Admitting: Family Medicine

## 2017-06-23 DIAGNOSIS — Z0289 Encounter for other administrative examinations: Secondary | ICD-10-CM

## 2017-06-28 ENCOUNTER — Ambulatory Visit: Payer: Medicare Other | Admitting: Nurse Practitioner

## 2017-07-06 ENCOUNTER — Ambulatory Visit: Payer: Medicare Other | Admitting: Nurse Practitioner

## 2017-07-06 ENCOUNTER — Ambulatory Visit: Payer: Self-pay | Admitting: Nurse Practitioner

## 2017-07-07 ENCOUNTER — Encounter: Payer: Self-pay | Admitting: Nurse Practitioner

## 2017-07-07 ENCOUNTER — Ambulatory Visit (INDEPENDENT_AMBULATORY_CARE_PROVIDER_SITE_OTHER): Payer: Medicare Other | Admitting: Nurse Practitioner

## 2017-07-07 VITALS — BP 114/68 | HR 64 | Temp 98.4°F | Wt 186.0 lb

## 2017-07-07 DIAGNOSIS — D649 Anemia, unspecified: Secondary | ICD-10-CM | POA: Diagnosis not present

## 2017-07-07 DIAGNOSIS — M109 Gout, unspecified: Secondary | ICD-10-CM

## 2017-07-07 DIAGNOSIS — M159 Polyosteoarthritis, unspecified: Secondary | ICD-10-CM

## 2017-07-07 DIAGNOSIS — G309 Alzheimer's disease, unspecified: Secondary | ICD-10-CM | POA: Diagnosis not present

## 2017-07-07 DIAGNOSIS — F419 Anxiety disorder, unspecified: Secondary | ICD-10-CM | POA: Diagnosis not present

## 2017-07-07 DIAGNOSIS — F0281 Dementia in other diseases classified elsewhere with behavioral disturbance: Secondary | ICD-10-CM

## 2017-07-07 MED ORDER — SERTRALINE HCL 50 MG PO TABS: 50.0000 mg | ORAL_TABLET | Freq: Every day | ORAL | 1 refills | Status: DC

## 2017-07-07 NOTE — Patient Instructions (Addendum)
To start Zoloft 1/2 tablet=25 mg by mouth daily for 2 weeks then increase to 1 tablet.   May use melatonin 3 mg by mouth nightly for sleep    To follow up in 4 weeks for AWV and physical To have blood work prior to appt (does not have to be fasting)

## 2017-07-07 NOTE — Progress Notes (Signed)
Careteam: Patient Care Team: Lauree Chandler, NP as PCP - General (Geriatric Medicine) Pieter Partridge, DO as Consulting Physician (Neurology)  Advanced Directive information    Allergies  Allergen Reactions  . Hydrocodone Other (See Comments)    Reaction:  Hallucinations  . Indocin [Indomethacin] Other (See Comments)    Reaction:  Confusion   . Amoxicillin-Pot Clavulanate Other (See Comments)    Reaction:  Nightmares Has patient had a PCN reaction causing immediate rash, facial/tongue/throat swelling, SOB or lightheadedness with hypotension: No Has patient had a PCN reaction causing severe rash involving mucus membranes or skin necrosis: No Has patient had a PCN reaction that required hospitalization: No Has patient had a PCN reaction occurring within the last 10 years: Yes If all of the above answers are "NO", then may proceed with Cephalosporin use.  . Erythromycin Ethylsuccinate Other (See Comments)    Reaction:  Eye irritation     Chief Complaint  Patient presents with  . New Patient (Initial Visit)    Previously seeing an Eurologist with [facility] but hasn't recently due to hospital visits. He has been off all medications for about a month.   . Other    son and caregiver in the room     HPI: Patient is a 77 y.o. male seen in the office today to establish care. Would like geriatric care.  Due for AWV on May 6th.  Pt with hx of dementia, UTI, prostate cancer s/p radiation, gout.  Hx prostate cancer, radiation last summer which he stopped 1 week early due to side effects but PSA was 0. Continues to follow up with urologist.   Gout- no recent flares, rarely flares. Has made lifestyle modifications which helped.   Dementia with behaviors- previously on namzaric, brought out a harsh side of him. Increase in agitation. When they stopped medication he became calmer.  He was on aricept for many years but when started namzaric started having more behaviors.  Still able to  bath himself, dress himself, feed himself with minimal assistance.  Caregiver reports in the evening he has sundowners. In the last few months behaviors are worse. He attempts to get out of the house and then will wander down street/in the woods.  Good appetite.  Increase anxiety.  Caregiver takes him places and socializes him. Goes to eat. Never alone. Goes to church, sings.   Osteoarthritis- intolerant to tylenol, was taking routinely and had esophagitis.  Joint pains mostly in shoulders and knees. Played a lot of sports and hunted.  Does not require medication daily. Has used NSAIDs in the past as needed.   Hx of UTI, was hospitalized 10/2016 and then went to rehab.   Review of Systems:  Review of Systems  Unable to perform ROS: Dementia  caregiver and son providing history  Past Medical History:  Diagnosis Date  . Alzheimer disease   . Arthritis   . Dementia 09/16/2016  . Gout    usually knees, occasionally feet  . Hearing aid worn   . Prostate cancer [facility])    Past Surgical History:  Procedure Laterality Date  . COLONOSCOPY    . HEMORROIDECTOMY    . PROSTATE BIOPSY    . TONSILLECTOMY AND ADENOIDECTOMY     Social History:   reports that he quit smoking about 59 years ago. His smoking use included cigarettes. He has a 2.50 pack-year smoking history. He has quit using smokeless tobacco. He reports that he does not drink alcohol or use drugs.  Family History  Problem Relation Age of Onset  . Diverticulitis Father   . Cancer Paternal Uncle   . Colon cancer Neg Hx     Medications: Patient's Medications   No medications on file     Physical Exam:  Vitals:   07/07/17 1332  BP: 114/68  Pulse: 64  Temp: 98.4 F (36.9 C)  SpO2: 97%  Weight: 186 lb (84.4 kg)   Body mass index is 27.47 kg/m.  Physical Exam  Constitutional: He appears well-developed and well-nourished. No distress.  HENT:  Head: Normocephalic and atraumatic.  Eyes: Pupils are equal, round,  and reactive to light. Conjunctivae and EOM are normal.  Neck: Normal range of motion. Neck supple.  Cardiovascular: Normal rate, regular rhythm and normal heart sounds.  Pulmonary/Chest: Effort normal and breath sounds normal.  Abdominal: Soft. Bowel sounds are normal. He exhibits no distension. There is no tenderness.  Musculoskeletal: He exhibits no edema or tenderness.  Neurological: He is alert.  Skin: Skin is warm and dry. He is not diaphoretic.  Psychiatric: He has a normal mood and affect. Cognition and memory are impaired. He exhibits abnormal recent memory. He exhibits normal remote memory.    Labs reviewed: Basic Metabolic Panel: Recent Labs    09/16/16 2100 09/16/16 2253  10/25/16 0350  11/11/16 0622 11/12/16 1051 11/13/16 0803  NA  --   --    < > 139   < > 143 144 143  K  --   --    < > 3.0*   < > 3.5 3.5 3.0*  CL  --   --    < > 110   < > 109 106 106  CO2  --   --    < > 20*   < > 28 29 28   GLUCOSE  --   --    < > 134*   < > 115* 111* 91  BUN  --   --    < > 17   < > 7 9 7   CREATININE  --   --    < > 1.37*   < > 0.82 0.90 0.93  CALCIUM  --   --    < > 7.7*   < > 8.2* 8.6* 8.5*  MG 1.6*  --   --  1.1*  --  1.9  --   --   TSH  --  2.186  --   --   --   --   --   --    < > = values in this interval not displayed.   Liver Function Tests: Recent Labs    09/16/16 1359 11/09/16 0626 11/10/16 0651  AST 78* 20 22  ALT 58 21 20  ALKPHOS 48 54 52  BILITOT 0.9 0.9 0.5  PROT 7.2 6.5 5.8*  ALBUMIN 3.1* 3.0* 2.8*   No results for input(s): LIPASE, AMYLASE in the last 8760 hours. Recent Labs    09/16/16 2118  AMMONIA 13   CBC: Recent Labs    09/16/16 1359  10/24/16 2143  11/09/16 0626  11/11/16 0622 11/12/16 1051 11/13/16 0803  WBC 4.6   < > 3.9*   < > 5.4   < > 4.4 3.7* 2.8*  NEUTROABS 3.8  --  3.8  --  4.3  --   --   --   --   HGB 11.5*   < > 11.9*   < > 10.5*   < > 8.8* 9.2* 9.1*  HCT 32.6*   < > 35.2*   < > 30.8*   < > 26.1* 27.5* 27.6*  MCV 91.6   <  > 91.9   < > 92.2   < > 92.2 91.1 90.5  PLT 159   < > 110*   < > 125*   < > 134* 139* 149*   < > = values in this interval not displayed.   Lipid Panel: No results for input(s): CHOL, HDL, LDLCALC, TRIG, CHOLHDL, LDLDIRECT in the last 8760 hours. TSH: Recent Labs    09/16/16 2253  TSH 2.186   A1C: No results found for: HGBA1C   Assessment/Plan 1. Alzheimer's dementia with behavioral disturbance, unspecified timing of dementia onset -progressive decline. Has care 24/7 with assistance as needed. Son and caregivers very involved.  -increase agitation when namzaric was started, had previously been on aricept without side effects. May restart aricept at next visit to help slow progression of disease.  - sertraline (ZOLOFT) 50 MG tablet; Take 1 tablet (50 mg total) by mouth daily.  Dispense: 30 tablet; Refill: 1 - DNR (Do Not Resuscitate) form completed and given to family, most form in chart however was not reviewed with family by a provider, no signature on copy scanned into system. Will need to review with pt and family at next [place].   2. Osteoarthritis of multiple joints, unspecified osteoarthritis type -unable to tolerate tylenol routinely. Discussed use of routine NSAIDs due to renal and GI side effects. Continues to stay active which helps joints and decrease pain.  - COMPLETE METABOLIC PANEL WITH GFR; Future  3. Anxiety -to start zoloft 25 mg daily for 2 weeks then increase to 50 mg daily  - sertraline (ZOLOFT) 50 MG tablet; Take 1 tablet (50 mg total) by mouth daily.  Dispense: 30 tablet; Refill: 1 - COMPLETE METABOLIC PANEL WITH GFR; Future  4. Gout, unspecified cause, unspecified chronicity, unspecified site -stable without recent flares, controlled on lifestyle modifications.   5. Anemia, unspecified type - CBC with Differential/Platelets; Future  Next appt: 08/02/2017 Carlos [facility]. [place], [facility] [phone]

## 2017-07-09 ENCOUNTER — Other Ambulatory Visit: Payer: Self-pay | Admitting: Nurse Practitioner

## 2017-07-09 ENCOUNTER — Telehealth: Payer: Self-pay | Admitting: *Deleted

## 2017-07-09 MED ORDER — TRAZODONE HCL 50 MG PO TABS: 50.0000 mg | ORAL_TABLET | Freq: Every day | ORAL | 3 refills | Status: DC

## 2017-07-09 NOTE — Telephone Encounter (Signed)
POA  [employer], calling stating they need something to help patient sleep, pt is up all night and haven't slept in 2 days. They have to ride patient around in the car to calm him down, pt try to get out of windows and doors. Per POA pt has tried trazodone before and it worked, pt tried Melatonin and it made him a " wild man". They are desperate and need some help ASAP

## 2017-07-09 NOTE — Telephone Encounter (Signed)
Spoke with Ricky Boyd aware RX sent to pharmacy

## 2017-07-09 NOTE — Telephone Encounter (Signed)
We can restart trazodone 50 mg by mouth at bedtime for sleep

## 2017-07-09 NOTE — Telephone Encounter (Signed)
Mailbox full; can't leave message. 

## 2017-07-29 ENCOUNTER — Telehealth: Payer: Self-pay | Admitting: Nurse Practitioner

## 2017-07-29 NOTE — Telephone Encounter (Signed)
I tried calling to reschedule the patient's AWV to 1:45 on 08/04/17 so it's before the physical and not after.  There was no answer and no option to leave a message. VDM (DD)

## 2017-08-02 ENCOUNTER — Other Ambulatory Visit: Payer: Medicare Other

## 2017-08-02 NOTE — Telephone Encounter (Signed)
I left a message asking the pt to come in at 1:45 on 08/04/17 so AWV is before CPE. I also reminded him of tomorrow's lab appt. VDM (DD)

## 2017-08-03 ENCOUNTER — Other Ambulatory Visit: Payer: Self-pay

## 2017-08-04 ENCOUNTER — Encounter: Payer: Medicare Other | Admitting: Nurse Practitioner

## 2017-08-04 ENCOUNTER — Other Ambulatory Visit: Payer: Self-pay

## 2017-08-04 ENCOUNTER — Ambulatory Visit (INDEPENDENT_AMBULATORY_CARE_PROVIDER_SITE_OTHER): Payer: Medicare Other

## 2017-08-04 ENCOUNTER — Ambulatory Visit: Payer: Medicare Other

## 2017-08-04 VITALS — BP 118/60 | HR 70 | Temp 97.6°F | Ht 69.0 in | Wt 185.0 lb

## 2017-08-04 DIAGNOSIS — F0281 Dementia in other diseases classified elsewhere with behavioral disturbance: Secondary | ICD-10-CM | POA: Diagnosis not present

## 2017-08-04 DIAGNOSIS — G309 Alzheimer's disease, unspecified: Secondary | ICD-10-CM | POA: Diagnosis not present

## 2017-08-04 DIAGNOSIS — Z0000 Encounter for general adult medical examination without abnormal findings: Secondary | ICD-10-CM

## 2017-08-04 MED ORDER — ZOSTER VAC RECOMB ADJUVANTED 50 MCG/0.5ML IM SUSR: 0.5000 mL | Freq: Once | INTRAMUSCULAR | 1 refills | Status: AC

## 2017-08-04 NOTE — Addendum Note (Signed)
Addended by: [name] E on: 08/04/2017 04:01 PM   Modules accepted: Orders

## 2017-08-04 NOTE — Patient Instructions (Addendum)
Ricky Boyd , Thank you for taking time to come for your Medicare Wellness Visit. I appreciate your ongoing commitment to your health goals. Please review the following plan we discussed and let me know if I can assist you in the future.   Screening recommendations/referrals: Colonoscopy excluded, over age 79 Recommended yearly ophthalmology/optometry visit for glaucoma screening and checkup Recommended yearly dental visit for hygiene and checkup  Vaccinations: Influenza vaccine due 2019 fall season Pneumococcal vaccine up to date, completed Tdap vaccine up to date, due 12/27/2018 Shingles vaccine due, ordered to pharmacy    Advanced directives: In chart  Conditions/risks identified: none  Next appointment: Sherrie Mustache, NP 08/10/2017 @ 1:30 pm            [name], RN 08/08/2018 @ 2:30pm  Preventive Care 28 Years and Older, Male Preventive care refers to lifestyle choices and visits with your health care provider that can promote health and wellness. What does preventive care include?  A yearly physical exam. This is also called an annual well check.  Dental exams once or twice a year.  Routine eye exams. Ask your health care provider how often you should have your eyes checked.  Personal lifestyle choices, including:  Daily care of your teeth and gums.  Regular physical activity.  Eating a healthy diet.  Avoiding tobacco and drug use.  Limiting alcohol use.  Practicing safe sex.  Taking low doses of aspirin every day.  Taking vitamin and mineral supplements as recommended by your health care provider. What happens during an annual well check? The services and screenings done by your health care provider during your annual well check will depend on your age, overall health, lifestyle risk factors, and family history of disease. Counseling  Your health care provider may ask you questions about your:  Alcohol use.  Tobacco use.  Drug use.  Emotional  well-being.  Home and relationship well-being.  Sexual activity.  Eating habits.  History of falls.  Memory and ability to understand (cognition).  Work and work Statistician. Screening  You may have the following tests or measurements:  Height, weight, and BMI.  Blood pressure.  Lipid and cholesterol levels. These may be checked every 5 years, or more frequently if you are over 8 years old.  Skin check.  Lung cancer screening. You may have this screening every year starting at age 37 if you have a 30-pack-year history of smoking and currently smoke or have quit within the past 15 years.  Fecal occult blood test (FOBT) of the stool. You may have this test every year starting at age 15.  Flexible sigmoidoscopy or colonoscopy. You may have a sigmoidoscopy every 5 years or a colonoscopy every 10 years starting at age 69.  Prostate cancer screening. Recommendations will vary depending on your family history and other risks.  Hepatitis C blood test.  Hepatitis B blood test.  Sexually transmitted disease (STD) testing.  Diabetes screening. This is done by checking your blood sugar (glucose) after you have not eaten for a while (fasting). You may have this done every 1-3 years.  Abdominal aortic aneurysm (AAA) screening. You may need this if you are a current or former smoker.  Osteoporosis. You may be screened starting at age 54 if you are at high risk. Talk with your health care provider about your test results, treatment options, and if necessary, the need for more tests. Vaccines  Your health care provider may recommend certain vaccines, such as:  Influenza vaccine. This  is recommended every year.  Tetanus, diphtheria, and acellular pertussis (Tdap, Td) vaccine. You may need a Td booster every 10 years.  Zoster vaccine. You may need this after age 48.  Pneumococcal 13-valent conjugate (PCV13) vaccine. One dose is recommended after age 103.  Pneumococcal  polysaccharide (PPSV23) vaccine. One dose is recommended after age 48. Talk to your health care provider about which screenings and vaccines you need and how often you need them. This information is not intended to replace advice given to you by your health care provider. Make sure you discuss any questions you have with your health care provider. Document Released: 03/08/2015 Document Revised: 10/30/2015 Document Reviewed: 12/11/2014 Elsevier Interactive Patient Education  2017 [employer] Prevention in the Home Falls can cause injuries. They can happen to people of all ages. There are many things you can do to make your home safe and to help prevent falls. What can I do on the outside of my home?  Regularly fix the edges of walkways and driveways and fix any cracks.  Remove anything that might make you trip as you walk through a door, such as a raised step or threshold.  Trim any bushes or trees on the path to your home.  Use bright outdoor lighting.  Clear any walking paths of anything that might make someone trip, such as rocks or tools.  Regularly check to see if handrails are loose or broken. Make sure that both sides of any steps have handrails.  Any raised decks and porches should have guardrails on the edges.  Have any leaves, snow, or ice cleared regularly.  Use sand or salt on walking paths during winter.  Clean up any spills in your garage right away. This includes oil or grease spills. What can I do in the bathroom?  Use night lights.  Install grab bars by the toilet and in the tub and shower. Do not use towel bars as grab bars.  Use non-skid mats or decals in the tub or shower.  If you need to sit down in the shower, use a plastic, non-slip stool.  Keep the floor dry. Clean up any water that spills on the floor as soon as it happens.  Remove soap buildup in the tub or shower regularly.  Attach bath mats securely with double-sided non-slip rug  tape.  Do not have throw rugs and other things on the floor that can make you trip. What can I do in the bedroom?  Use night lights.  Make sure that you have a light by your bed that is easy to reach.  Do not use any sheets or blankets that are too big for your bed. They should not hang down onto the floor.  Have a firm chair that has side arms. You can use this for support while you get dressed.  Do not have throw rugs and other things on the floor that can make you trip. What can I do in the kitchen?  Clean up any spills right away.  Avoid walking on wet floors.  Keep items that you use a lot in easy-to-reach places.  If you need to reach something above you, use a strong step stool that has a grab bar.  Keep electrical cords out of the way.  Do not use floor polish or wax that makes floors slippery. If you must use wax, use non-skid floor wax.  Do not have throw rugs and other things on the floor that can make you  trip. What can I do with my stairs?  Do not leave any items on the stairs.  Make sure that there are handrails on both sides of the stairs and use them. Fix handrails that are broken or loose. Make sure that handrails are as long as the stairways.  Check any carpeting to make sure that it is firmly attached to the stairs. Fix any carpet that is loose or worn.  Avoid having throw rugs at the top or bottom of the stairs. If you do have throw rugs, attach them to the floor with carpet tape.  Make sure that you have a light switch at the top of the stairs and the bottom of the stairs. If you do not have them, ask someone to add them for you. What else can I do to help prevent falls?  Wear shoes that:  Do not have high heels.  Have rubber bottoms.  Are comfortable and fit you well.  Are closed at the toe. Do not wear sandals.  If you use a stepladder:  Make sure that it is fully opened. Do not climb a closed stepladder.  Make sure that both sides of the  stepladder are locked into place.  Ask someone to hold it for you, if possible.  Clearly mark and make sure that you can see:  Any grab bars or handrails.  First and last steps.  Where the edge of each step is.  Use tools that help you move around (mobility aids) if they are needed. These include:  Canes.  Walkers.  Scooters.  Crutches.  Turn on the lights when you go into a dark area. Replace any light bulbs as soon as they burn out.  Set up your furniture so you have a clear path. Avoid moving your furniture around.  If any of your floors are uneven, fix them.  If there are any pets around you, be aware of where they are.  Review your medicines with your doctor. Some medicines can make you feel dizzy. This can increase your chance of falling. Ask your doctor what other things that you can do to help prevent falls. This information is not intended to replace advice given to you by your health care provider. Make sure you discuss any questions you have with your health care provider. Document Released: 12/06/2008 Document Revised: 07/18/2015 Document Reviewed: 03/16/2014 Elsevier Interactive Patient Education  2017 [employer].

## 2017-08-04 NOTE — Progress Notes (Addendum)
Subjective:   Ricky Boyd is a 77 y.o. male who presents for Medicare Annual/Subsequent preventive examination.  Last AWV-06/27/2012    Objective:    Vitals: BP 118/60 (BP Location: Left Arm, Patient Position: Sitting)   Pulse 70   Temp 97.6 F (36.4 C) (Oral)   Ht 5\' 9"  (1.753 m)   Wt 185 lb (83.9 kg)   SpO2 96%   BMI 27.32 kg/m   Body mass index is 27.32 kg/m.  Advanced Directives 08/04/2017 11/09/2016 11/09/2016 10/24/2016 10/17/2016 09/30/2016 09/16/2016  Does Patient Have a Medical Advance Directive? Yes Yes Yes Yes Yes Yes Yes  Type of Paramedic of [place];Living will;Out of facility DNR (pink MOST or yellow form) [place];Living will Out of facility DNR (pink MOST or yellow form) Living will;Healthcare Power of Attorney Living will;Healthcare Power of [place];Living will [place];Living will  Does patient want to make changes to medical advance directive? No - Patient declined No - Patient declined - No - Patient declined - No - Patient declined No - Patient declined  Copy of [place] in Chart? Yes Yes - No - copy requested No - copy requested Yes Yes  Pre-existing out of facility DNR order (yellow form or pink MOST form) Yellow form placed in chart (order not valid for inpatient use) - - - - - -    Tobacco Social History   Tobacco Use  Smoking Status Former Smoker  . Packs/day: 0.25  . Years: 10.00  . Pack years: 2.50  . Types: Cigarettes  . Last attempt to quit: 02/23/1958  . Years since quitting: 59.4  Smokeless Tobacco Former Systems developer  Tobacco Comment   quit 1963     Counseling given: Not Answered Comment: quit 1963   Clinical Intake:  Pre-visit preparation completed: No  Pain : No/denies pain     Nutritional Risks: None Diabetes: No  How often do you need to have someone help you when you read instructions, pamphlets, or other written materials  from your doctor or pharmacy?: 1 - Never What is the last grade level you completed in school?: Master  Interpreter Needed?: No  Information entered by :: [name], RN  Past Medical History:  Diagnosis Date  . Alzheimer disease   . Arthritis   . Dementia 09/16/2016  . Gout    usually knees, occasionally feet  . Hearing aid worn   . 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 Prostate cancer [facility])    Past Surgical History:  Procedure Laterality Date  . COLONOSCOPY    . HEMORROIDECTOMY    . PROSTATE BIOPSY    . TONSILLECTOMY AND ADENOIDECTOMY     Family History  Problem Relation Age of Onset  . Diverticulitis Father   . Cancer Paternal Uncle   . Colon cancer Neg Hx    Social History   Socioeconomic History  . Marital status: Widowed    Spouse name: Not on file  . Number of children: 3  . Years of education: Master's  . Highest education level: Not on file  Occupational History  . Occupation: retired  Scientific laboratory technician  . Financial resource strain: Not hard at all  . Food insecurity:    Worry: Never true    Inability: Never true  . Transportation needs:    Medical: No    Non-medical: No  Tobacco Use  . Smoking status: Former Smoker    Packs/day: 0.25  Years: 10.00    Pack years: 2.50    Types: Cigarettes    Last attempt to quit: 02/23/1958    Years since quitting: 59.4  . Smokeless tobacco: Former Systems developer  . Tobacco comment: quit 1963  Substance and Sexual Activity  . Alcohol use: No    Alcohol/week: 3.6 oz    Types: 6 Cans of beer per week    Comment: h/o occasional use  . Drug use: No  . Sexual activity: Not Currently  Lifestyle  . Physical activity:    Days per week: 7 days    Minutes per session: 20 min  . Stress: Not at all  Relationships  . Social connections:    Talks on phone: More than three times a week    Gets together: More than three times a week    Attends religious service: More than 4 times per year    Active member of club or  organization: No    Attends meetings of clubs or organizations: Never    Relationship status: Widowed  Other Topics Concern  . Not on file  Social History Narrative   Social History      Diet? regular      Do you drink/eat things with caffeine? yes      Marital status?       widow                             What year were you married?      Do you live in a house, apartment, assisted living, condo, trailer, etc.? home      Is it one or more stories? 1 floor      How many persons live in your home? Round clock care      Do you have any pets in your home? (please list) no      Highest level of education completed? Masters in [employer]      Current or past profession:      Do you exercise?        some                              Type & how often? walk      Advanced Directives      Do you have a living will? yes      Do you have a DNR form?                                  If not, do you want to discuss one? yes      Do you have signed POA/HPOA for forms? yes      Functional Status      Do you have difficulty bathing or dressing yourself? yes      Do you have difficulty preparing food or eating? yes      Do you have difficulty managing your medications? yes      Do you have difficulty managing your finances? yes      Do you have difficulty affording your medications? no    Outpatient Encounter Medications as of 08/04/2017  Medication Sig  . sertraline (ZOLOFT) 50 MG tablet Take 1 tablet (50 mg total) by mouth daily.  . traZODone (DESYREL) 50 MG tablet Take 1 tablet (50 mg total) by mouth  at bedtime.  Marland Kitchen Zoster Vaccine Adjuvanted [facility]) injection Inject 0.5 mLs into the muscle once for 1 dose.  . [DISCONTINUED] Zoster Vaccine Adjuvanted [facility]) injection Inject 0.5 mLs into the muscle once.   No facility-administered encounter medications on file as of 08/04/2017.     Activities of Daily Living In your present state of health, do you have any difficulty  performing the following activities: 08/04/2017 11/09/2016  Hearing? Y Y  Comment - BILATERAL HEARING AIDS  Vision? N N  Difficulty concentrating or making decisions? Tempie Donning  Walking or climbing stairs? N Y  Dressing or bathing? N Y  Doing errands, shopping? N Y  Conservation officer, nature and eating ? N -  Using the Toilet? N -  Managing your Medications? N -  Managing your Finances? N -  Housekeeping or managing your Housekeeping? N -  Some recent data might be hidden    Patient Care Team: Lauree Chandler, NP as PCP - General (Geriatric Medicine) Pieter Partridge, DO as Consulting Physician (Neurology)   Assessment:   This is a routine wellness examination for Hermann.  Exercise Activities and Dietary recommendations Current Exercise Habits: Home exercise routine, Type of exercise: walking;strength training/weights, Time (Minutes): 20, Frequency (Times/Week): 5, Weekly Exercise (Minutes/Week): 100, Intensity: Mild  Goals    None      Fall Risk Fall Risk  08/04/2017 05/21/2016 04/23/2016 08/06/2015 06/27/2012  Falls in the past year? No No No No No   Is the patient's home free of loose throw rugs in walkways, pet beds, electrical cords, etc?   yes      Grab bars in the bathroom? yes      Handrails on the stairs?   yes      Adequate lighting?   yes  Depression Screen PHQ 2/9 Scores 08/04/2017 04/23/2016 08/06/2015 06/27/2012  PHQ - 2 Score 0 0 0 0    Cognitive Function MMSE - Mini Mental State Exam 08/04/2017 05/21/2016 08/06/2015  Orientation to time 1 3 5   Orientation to Place 1 5 5   Registration 3 3 3   Attention/ Calculation 0 2 0  Recall 0 0 1  Recall-comments - - pt was unable to recall 2 of 3 words without cues  Language- name 2 objects 2 2 0  Language- repeat 1 1 1   Language- follow 3 step command 3 2 3   Language- read & follow direction 1 1 0  Write a sentence 0 1 0  Copy design 0 0 0  Total score 12 20 18    Montreal Cognitive Assessment  08/08/2015 11/05/2014  Visuospatial/  Executive (0/5) 2 4  Naming (0/3) 3 3  Attention: Read list of digits (0/2) 2 2  Attention: Read list of letters (0/1) 1 1  Attention: Serial 7 subtraction starting at 100 (0/3) 3 3  Language: Repeat phrase (0/2) 2 1  Language : Fluency (0/1) 1 1  Abstraction (0/2) 1 2  Delayed Recall (0/5) 0 0  Orientation (0/6) 4 6  Total 19 23  Adjusted Score (based on education) 19 23      Immunization History  Administered Date(s) Administered  . Influenza Split 01/20/2011  . Influenza Whole 12/24/2006, 12/08/2007, 10/24/2008  . Influenza,inj,Quad PF,6+ Mos 04/02/2014  . Pneumococcal Conjugate-13 04/02/2014  . Pneumococcal Polysaccharide-23 11/25/2004  . Td 03/29/1998, 12/26/2008  . Zoster 08/30/2012    Qualifies for Shingles Vaccine? Yes, educated and ordered to pharmacy  Screening Tests Health Maintenance  Topic Date Due  . COLONOSCOPY  04/16/2016  .  INFLUENZA VACCINE  09/23/2017  . TETANUS/TDAP  12/27/2018  . PNA vac Low Risk Adult  Completed   Cancer Screenings: Lung: Low Dose CT Chest recommended if Age 79-80 years, 30 pack-year currently smoking OR have quit w/in 15years. Patient does not qualify. Colorectal: up to date  Additional Screenings:  Hepatitis C Screening:declined      Plan:    I have personally reviewed and addressed the Medicare Annual Wellness questionnaire and have noted the following in the patient's chart:  A. Medical and social history B. Use of alcohol, tobacco or illicit drugs  C. Current medications and supplements D. Functional ability and status E.  Nutritional status F.  Physical activity G. Advance directives H. List of other physicians I.  Hospitalizations, surgeries, and ER visits in previous 12 months J.  [place] to include hearing, vision, cognitive, depression L. Referrals and appointments - C3 for request of family support classes for dementia  In addition, I have reviewed and discussed with patient certain preventive  protocols, quality metrics, and best practice recommendations. A written personalized care plan for preventive services as well as general preventive health recommendations were provided to patient.  See attached scanned questionnaire for additional information.   Signed,   [name], RN Nurse Health Advisor  Patient Concerns: Patient family requested a medication to help with his sundowning such as Ativan PRN

## 2017-08-05 ENCOUNTER — Other Ambulatory Visit: Payer: Self-pay

## 2017-08-05 ENCOUNTER — Encounter: Payer: Medicare Other | Admitting: Nurse Practitioner

## 2017-08-05 LAB — COMPLETE METABOLIC PANEL WITH GFR
AG RATIO: 1.5 (calc) (ref 1.0–2.5)
ALBUMIN MSPROF: 4 g/dL (ref 3.6–5.1)
ALKALINE PHOSPHATASE (APISO): 81 U/L (ref 40–115)
ALT: 11 U/L (ref 9–46)
AST: 18 U/L (ref 10–35)
BILIRUBIN TOTAL: 0.4 mg/dL (ref 0.2–1.2)
BUN/Creatinine Ratio: 12 (calc) (ref 6–22)
BUN: 16 mg/dL (ref 7–25)
CHLORIDE: 105 mmol/L (ref 98–110)
CO2: 32 mmol/L (ref 20–32)
CREATININE: 1.34 mg/dL — AB (ref 0.70–1.18)
Calcium: 9.1 mg/dL (ref 8.6–10.3)
GFR, Est African American: 58 mL/min/{1.73_m2} — ABNORMAL LOW (ref >=60)
GFR, Est Non African American: 50 mL/min/{1.73_m2} — ABNORMAL LOW (ref >=60)
Globulin: 2.7 g/dL (calc) (ref 1.9–3.7)
Glucose, Bld: 90 mg/dL (ref 65–139)
Potassium: 4.2 mmol/L (ref 3.5–5.3)
SODIUM: 141 mmol/L (ref 135–146)
Total Protein: 6.7 g/dL (ref 6.1–8.1)

## 2017-08-05 LAB — CBC WITH DIFFERENTIAL/PLATELET
BASOS ABS: 22 {cells}/uL (ref 0–200)
Basophils Relative: 0.4 %
EOS ABS: 248 {cells}/uL (ref 15–500)
Eosinophils Relative: 4.5 %
HEMATOCRIT: 40.3 % (ref 38.5–50.0)
HEMOGLOBIN: 14 g/dL (ref 13.2–17.1)
LYMPHS ABS: 798 {cells}/uL — AB (ref 850–3900)
MCH: 30.8 pg (ref 27.0–33.0)
MCHC: 34.7 g/dL (ref 32.0–36.0)
MCV: 88.8 fL (ref 80.0–100.0)
MPV: 10.8 fL (ref 7.5–12.5)
Monocytes Relative: 9.4 %
NEUTROS ABS: 3916 {cells}/uL (ref 1500–7800)
Neutrophils Relative %: 71.2 %
Platelets: 171 10*3/uL (ref 140–400)
RBC: 4.54 10*6/uL (ref 4.20–5.80)
RDW: 13.6 % (ref 11.0–15.0)
Total Lymphocyte: 14.5 %
WBC mixed population: 517 cells/uL (ref 200–950)
WBC: 5.5 10*3/uL (ref 3.8–10.8)

## 2017-08-10 ENCOUNTER — Encounter: Payer: Self-pay | Admitting: Nurse Practitioner

## 2017-08-10 ENCOUNTER — Ambulatory Visit (INDEPENDENT_AMBULATORY_CARE_PROVIDER_SITE_OTHER): Payer: Medicare Other | Admitting: Nurse Practitioner

## 2017-08-10 VITALS — BP 112/70 | HR 68 | Temp 98.4°F | Ht 69.0 in | Wt 182.0 lb

## 2017-08-10 DIAGNOSIS — G4700 Insomnia, unspecified: Secondary | ICD-10-CM | POA: Diagnosis not present

## 2017-08-10 DIAGNOSIS — F419 Anxiety disorder, unspecified: Secondary | ICD-10-CM | POA: Diagnosis not present

## 2017-08-10 DIAGNOSIS — Z0000 Encounter for general adult medical examination without abnormal findings: Secondary | ICD-10-CM | POA: Diagnosis not present

## 2017-08-10 DIAGNOSIS — G309 Alzheimer's disease, unspecified: Secondary | ICD-10-CM | POA: Diagnosis not present

## 2017-08-10 DIAGNOSIS — F0281 Dementia in other diseases classified elsewhere with behavioral disturbance: Secondary | ICD-10-CM | POA: Diagnosis not present

## 2017-08-10 MED ORDER — LORAZEPAM 0.5 MG PO TABS: 0.5000 mg | ORAL_TABLET | Freq: Every day | ORAL | 0 refills | Status: DC | PRN

## 2017-08-10 MED ORDER — TRAZODONE HCL 50 MG PO TABS: 50.0000 mg | ORAL_TABLET | Freq: Every day | ORAL | 1 refills | Status: DC

## 2017-08-10 MED ORDER — SERTRALINE HCL 100 MG PO TABS: 100.0000 mg | ORAL_TABLET | Freq: Every day | ORAL | 1 refills | Status: DC | PRN

## 2017-08-10 NOTE — Patient Instructions (Addendum)
To increase zoloft to 100 mg by mouth daily for mood May use ativan 0.5 mg by mouth daily as needed for increase anxiety/agitation  Call if no improvement on increase dose of zoloft

## 2017-08-10 NOTE — Progress Notes (Signed)
Provider: Lauree Chandler, NP  Patient Care Team: Lauree Chandler, NP as PCP - General (Geriatric Medicine) Pieter Partridge, DO as Consulting Physician (Neurology)  Extended Emergency Contact Information Primary Emergency Contact: Rosalio Loud Mobile Phone: [phone] Relation: Other Allergies  Allergen Reactions  . Hydrocodone Other (See Comments)    Reaction:  Hallucinations  . Indocin [Indomethacin] Other (See Comments)    Reaction:  Confusion   . Amoxicillin-Pot Clavulanate Other (See Comments)    Reaction:  Nightmares Has patient had a PCN reaction causing immediate rash, facial/tongue/throat swelling, SOB or lightheadedness with hypotension: No Has patient had a PCN reaction causing severe rash involving mucus membranes or skin necrosis: No Has patient had a PCN reaction that required hospitalization: No Has patient had a PCN reaction occurring within the last 10 years: Yes If all of the above answers are "NO", then may proceed with Cephalosporin use.  . Erythromycin Ethylsuccinate Other (See Comments)    Reaction:  Eye irritation    Code Status: DNR Goals of Care: Advanced Directive information Advanced Directives 08/10/2017  Does Patient Have a Medical Advance Directive? Yes  Type of Paramedic of [place];Out of facility DNR (pink MOST or yellow form)  Does patient want to make changes to medical advance directive? -  Copy of [place] in Chart? Yes  Pre-existing out of facility DNR order (yellow form or pink MOST form) Yellow form placed in chart (order not valid for inpatient use);Pink MOST form placed in chart (order not valid for inpatient use)     Chief Complaint  Patient presents with  . Medical Management of Chronic Issues    Pt is being seen for a complete physical.   . Other    Pt son and caregiver in room  . Medication Refill    rx pended for zoloft and trazodone   . Depression    score of 0  .  ACP    PT has HCPOA, MOST and DNR    HPI: Patient is a 77 y.o. male seen in today for an annual wellness exam.   Major illnesses or hospitalization in the last year-none AWV reviewed.  Continues to have sundowning but improvement on trazodone and Zoloft. Has episodes that he is extremely agitated. Having to call son and sometimes he can not calm him down.   Depression screen [facility] 2/9 08/10/2017 08/04/2017 04/23/2016 08/06/2015 06/27/2012  Decreased Interest 0 0 0 0 0  Down, Depressed, Hopeless 0 0 0 0 0  PHQ - 2 Score 0 0 0 0 0    Fall Risk  08/10/2017 08/04/2017 05/21/2016 04/23/2016 08/06/2015  Falls in the past year? No No No No No   MMSE - Mini Mental State Exam 08/04/2017 05/21/2016 08/06/2015  Orientation to time 1 3 5   Orientation to Place 1 5 5   Registration 3 3 3   Attention/ Calculation 0 2 0  Recall 0 0 1  Recall-comments - - pt was unable to recall 2 of 3 words without cues  Language- name 2 objects 2 2 0  Language- repeat 1 1 1   Language- follow 3 step command 3 2 3   Language- read & follow direction 1 1 0  Write a sentence 0 1 0  Copy design 0 0 0  Total score 12 20 18      Health Maintenance  Topic Date Due  . COLONOSCOPY  04/16/2016  . INFLUENZA VACCINE  09/23/2017  . TETANUS/TDAP  12/27/2018  .  PNA vac Low Risk Adult  Completed    Diet? Fairly healthy, occasional ice cream  Exercise? Walks, very active  Dentition: partials on the bottom and full set on top, only goes as needed Ophthalmologist? Does not go.  Pain? Joint pain due to OA. Trazodone and zoloft has helped the pain, had side effect with tylenol- got bad hiccups/esophagitis   Past Medical History:  Diagnosis Date  . Alzheimer disease   . Arthritis   . Dementia 09/16/2016  . Gout    usually knees, occasionally feet  . Hearing aid worn   . 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 Prostate cancer [facility])     Past Surgical History:  Procedure Laterality Date  . COLONOSCOPY    . HEMORROIDECTOMY    .  PROSTATE BIOPSY    . TONSILLECTOMY AND ADENOIDECTOMY      Social History   Socioeconomic History  . Marital status: Widowed    Spouse name: Not on file  . Number of children: 3  . Years of education: Master's  . Highest education level: Not on file  Occupational History  . Occupation: retired  Scientific laboratory technician  . Financial resource strain: Not hard at all  . Food insecurity:    Worry: Never true    Inability: Never true  . Transportation needs:    Medical: No    Non-medical: No  Tobacco Use  . Smoking status: Former Smoker    Packs/day: 0.25    Years: 10.00    Pack years: 2.50    Types: Cigarettes    Last attempt to quit: 02/23/1958    Years since quitting: 59.5  . Smokeless tobacco: Former Systems developer  . Tobacco comment: quit 1963  Substance and Sexual Activity  . Alcohol use: No    Alcohol/week: 3.6 oz    Types: 6 Cans of beer per week    Comment: h/o occasional use  . Drug use: No  . Sexual activity: Not Currently  Lifestyle  . Physical activity:    Days per week: 7 days    Minutes per session: 20 min  . Stress: Not at all  Relationships  . Social connections:    Talks on phone: More than three times a week    Gets together: More than three times a week    Attends religious service: More than 4 times per year    Active member of club or organization: No    Attends meetings of clubs or organizations: Never    Relationship status: Widowed  Other Topics Concern  . Not on file  Social History Narrative   Social History      Diet? regular      Do you drink/eat things with caffeine? yes      Marital status?       widow                             What year were you married?      Do you live in a house, apartment, assisted living, condo, trailer, etc.? home      Is it one or more stories? 1 floor      How many persons live in your home? Round clock care      Do you have any pets in your home? (please list) no      Highest level of education completed? Masters in  [employer]      Current or  past profession:      Do you exercise?        some                              Type & how often? walk      Advanced Directives      Do you have a living will? yes      Do you have a DNR form?                                  If not, do you want to discuss one? yes      Do you have signed POA/HPOA for forms? yes      Functional Status      Do you have difficulty bathing or dressing yourself? yes      Do you have difficulty preparing food or eating? yes      Do you have difficulty managing your medications? yes      Do you have difficulty managing your finances? yes      Do you have difficulty affording your medications? no    Family History  Problem Relation Age of Onset  . Diverticulitis Father   . Cancer Paternal Uncle   . Colon cancer Neg Hx     Review of Systems:  Review of Systems  Unable to perform ROS: Dementia     Allergies as of 08/10/2017      Reactions   Hydrocodone Other (See Comments)   Reaction:  Hallucinations   Indocin [indomethacin] Other (See Comments)   Reaction:  Confusion    Amoxicillin-pot Clavulanate Other (See Comments)   Reaction:  Nightmares Has patient had a PCN reaction causing immediate rash, facial/tongue/throat swelling, SOB or lightheadedness with hypotension: No Has patient had a PCN reaction causing severe rash involving mucus membranes or skin necrosis: No Has patient had a PCN reaction that required hospitalization: No Has patient had a PCN reaction occurring within the last 10 years: Yes If all of the above answers are "NO", then may proceed with Cephalosporin use.   Erythromycin Ethylsuccinate Other (See Comments)   Reaction:  Eye irritation       Medication List        Accurate as of 08/10/17  1:37 PM. Always use your most recent med list.          sertraline 50 MG tablet Commonly known as:  ZOLOFT Take 1 tablet (50 mg total) by mouth daily.   traZODone 50 MG tablet Commonly known  as:  DESYREL Take 1 tablet (50 mg total) by mouth at bedtime.         Physical Exam: Vitals:   08/10/17 1334  BP: 112/70  Pulse: 68  Temp: 98.4 F (36.9 C)  TempSrc: Oral  SpO2: 99%  Weight: 182 lb (82.6 kg)  Height: 5\' 9"  (1.753 m)   Body mass index is 26.88 kg/m. Physical Exam  Constitutional: He appears well-developed and well-nourished. No distress.  HENT:  Head: Normocephalic and atraumatic.  Eyes: Pupils are equal, round, and reactive to light. Conjunctivae and EOM are normal.  Neck: Normal range of motion. Neck supple.  Cardiovascular: Normal rate, regular rhythm and normal heart sounds.  Pulmonary/Chest: Effort normal and breath sounds normal.  Abdominal: Soft. Bowel sounds are normal. He exhibits no distension. There is no tenderness.  Musculoskeletal: He exhibits no edema  or tenderness.  Neurological: He is alert.  Skin: Skin is warm and dry. He is not diaphoretic.  Psychiatric: He has a normal mood and affect. Cognition and memory are impaired. He exhibits abnormal recent memory. He exhibits normal remote memory.    Labs reviewed: Basic Metabolic Panel: Recent Labs    09/16/16 2100 09/16/16 2253  10/25/16 0350  11/11/16 0622 11/12/16 1051 11/13/16 0803 08/04/17 1447  NA  --   --    < > 139   < > 143 144 143 141  K  --   --    < > 3.0*   < > 3.5 3.5 3.0* 4.2  CL  --   --    < > 110   < > 109 106 106 105  CO2  --   --    < > 20*   < > 28 29 28  32  GLUCOSE  --   --    < > 134*   < > 115* 111* 91 90  BUN  --   --    < > 17   < > 7 9 7 16   CREATININE  --   --    < > 1.37*   < > 0.82 0.90 0.93 1.34*  CALCIUM  --   --    < > 7.7*   < > 8.2* 8.6* 8.5* 9.1  MG 1.6*  --   --  1.1*  --  1.9  --   --   --   TSH  --  2.186  --   --   --   --   --   --   --    < > = values in this interval not displayed.   Liver Function Tests: Recent Labs    09/16/16 1359 11/09/16 0626 11/10/16 0651 08/04/17 1447  AST 78* 20 22 18   ALT 58 21 20 11   ALKPHOS 48 54 52   --   BILITOT 0.9 0.9 0.5 0.4  PROT 7.2 6.5 5.8* 6.7  ALBUMIN 3.1* 3.0* 2.8*  --    No results for input(s): LIPASE, AMYLASE in the last 8760 hours. Recent Labs    09/16/16 2118  AMMONIA 13   CBC: Recent Labs    10/24/16 2143  11/09/16 0626  11/12/16 1051 11/13/16 0803 08/04/17 1447  WBC 3.9*   < > 5.4   < > 3.7* 2.8* 5.5  NEUTROABS 3.8  --  4.3  --   --   --  3,916  HGB 11.9*   < > 10.5*   < > 9.2* 9.1* 14.0  HCT 35.2*   < > 30.8*   < > 27.5* 27.6* 40.3  MCV 91.9   < > 92.2   < > 91.1 90.5 88.8  PLT 110*   < > 125*   < > 139* 149* 171   < > = values in this interval not displayed.   Lipid Panel: No results for input(s): CHOL, HDL, LDLCALC, TRIG, CHOLHDL, LDLDIRECT in the last 8760 hours. No results found for: HGBA1C  Procedures: No results found.  Assessment/Plan 1. Alzheimer's dementia with behavioral disturbance, unspecified timing of dementia onset -progressive decline, has caregiver support 24/7, goal is to keep him at home.  - sertraline (ZOLOFT) 100 MG tablet; Take 1 tablet (100 mg total) by mouth daily as needed.  Dispense: 30 tablet; Refill: 1 - [facility]  2. Anxiety -will increase zoloft to 100 mg by  mouth daily at this time to see if there is improvement in anxiety. To give for another month if no improvement they will call and we discussed stopping Zoloft and changing to cymbalta (due to OA pain) to see if this will help better with mood.  - sertraline (ZOLOFT) 100 MG tablet; Take 1 tablet (100 mg total) by mouth daily as needed.  Dispense: 30 tablet; Refill: 1 -will give ativan to use only when unable to calm him down, to start with 1/2 tablet and to increase to 1 tablet if needed  - LORazepam (ATIVAN) 0.5 MG tablet; Take 1 tablet (0.5 mg total) by mouth daily as needed for anxiety.  Dispense: 30 tablet; Refill: 0  3. Insomnia, unspecified type Improved on trazodone, will continue at this time - traZODone (DESYREL) 50 MG tablet;  Take 1 tablet (50 mg total) by mouth at bedtime.  Dispense: 90 tablet; Refill: 1  4. Wellness examination Has aged out of most screenings due to age and severity of dementia. Goal is more comfort and quality of life at this time.  Family continues to keep him at home with caregivers and provide support for him there. He is able to assist with ADLs.  Continue to keep him active and engaged. They also try to keep him eating healthy with occasional treats.   Next appt: 3 months with Dr Mariea Clonts, sooner if needed  Onycha K. [place], [facility] [phone]

## 2017-08-16 ENCOUNTER — Encounter: Payer: Self-pay | Admitting: Nurse Practitioner

## 2017-08-30 ENCOUNTER — Other Ambulatory Visit: Payer: Self-pay | Admitting: Nurse Practitioner

## 2017-08-30 ENCOUNTER — Telehealth: Payer: Self-pay

## 2017-08-30 DIAGNOSIS — G309 Alzheimer's disease, unspecified: Principal | ICD-10-CM

## 2017-08-30 DIAGNOSIS — F0281 Dementia in other diseases classified elsewhere with behavioral disturbance: Secondary | ICD-10-CM

## 2017-08-30 DIAGNOSIS — F419 Anxiety disorder, unspecified: Secondary | ICD-10-CM

## 2017-08-30 NOTE — Telephone Encounter (Signed)
Is he sleeping in the daytime?  Sometimes if the sleep cycles are backwards, that makes the night time behavior worse.  It's best to keep him up in the day if possible.    Trazodone could be increased to 100mg . Zolpidem does not tend to work well for dementia patients and may make his wandering and behavior worse.  Has he ever been on melatonin?  Sometimes it can help with the sleep cycle also if 5mg  is given at bedtime (over the counter).  Is he hallucinating or having delusions?

## 2017-08-30 NOTE — Telephone Encounter (Signed)
Patient's daughter called to say that the trazodone 50 mg is not working to help patient sleep. She asked if patient could try zolpidem? Patient does have lorazepam 0.5 mg but she does not give it to patient.   Patient is awake all night and has become destructive and combative. She stated that she is unsure of what to do.   Please advise

## 2017-08-31 MED ORDER — QUETIAPINE FUMARATE 25 MG PO TABS: 25.0000 mg | ORAL_TABLET | Freq: Every day | ORAL | 0 refills | Status: DC

## 2017-08-31 NOTE — Telephone Encounter (Signed)
We may be at the point where he needs to be placed on an antipsychotic.  These medications can increase the risk of stroke in dementia patients (there's a blackbox warning) so we try to avoid them, but if he's at risk of harming his caregiver, it seems it is time in my opinion.  I'd recommend we start with low dose seroquel 25mg  at bedtime.

## 2017-08-31 NOTE — Telephone Encounter (Signed)
I left a message for Ricky Boyd to call the office.
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
## 2017-08-31 NOTE — Telephone Encounter (Signed)
I spoke with Ricky Boyd and she verbalized understanding. She asked that the prescription be sent to the pharmacy. Patient's medication list has been updated.

## 2017-09-21 ENCOUNTER — Ambulatory Visit: Payer: Medicare Other | Admitting: Internal Medicine

## 2017-09-21 ENCOUNTER — Other Ambulatory Visit: Payer: Medicare Other

## 2017-09-21 DIAGNOSIS — R3915 Urgency of urination: Secondary | ICD-10-CM

## 2017-09-21 DIAGNOSIS — R1030 Lower abdominal pain, unspecified: Secondary | ICD-10-CM

## 2017-09-23 ENCOUNTER — Telehealth: Payer: Self-pay

## 2017-09-23 LAB — URINALYSIS, ROUTINE W REFLEX MICROSCOPIC
Bilirubin Urine: NEGATIVE
GLUCOSE, UA: NEGATIVE
Hyaline Cast: NONE SEEN /LPF
KETONES UR: NEGATIVE
Nitrite: NEGATIVE
PH: 5.5 (ref 5.0–8.0)
Specific Gravity, Urine: 1.023 (ref 1.001–1.03)

## 2017-09-23 LAB — URINE CULTURE
MICRO NUMBER: 90899098
SPECIMEN QUALITY:: ADEQUATE

## 2017-09-23 LAB — MICROSCOPIC MESSAGE

## 2017-09-23 MED ORDER — AMPICILLIN 500 MG PO CAPS: 500.0000 mg | ORAL_CAPSULE | Freq: Two times a day (BID) | ORAL | 0 refills | Status: DC

## 2017-09-23 NOTE — Telephone Encounter (Signed)
-----   Message from [place], [place] sent at 09/23/2017 11:19 AM EDT ----- (+) UTI - best treated with ampicillin (aware of PCN intolerance; if he has nightmares, please call office) 500mg  #14 take 1 tab po every 12 hrs x 7 days with no RF; take probiotic (culturelle, activia or floraster) daily while on medication; follow up as scheduled; go to the ER if symptoms worsen or do not improve in the next 3 - 4 days

## 2017-09-23 NOTE — Telephone Encounter (Signed)
Discussed results with Joellen [place] (caregiver), Katie verbalized understanding of results and plans to pick-up rx today along with a probiotic

## 2017-09-24 NOTE — Telephone Encounter (Signed)
Error

## 2017-09-25 ENCOUNTER — Other Ambulatory Visit: Payer: Self-pay | Admitting: Internal Medicine

## 2017-11-07 ENCOUNTER — Other Ambulatory Visit: Payer: Self-pay

## 2017-11-07 ENCOUNTER — Encounter (HOSPITAL_COMMUNITY): Payer: Self-pay

## 2017-11-07 ENCOUNTER — Emergency Department (HOSPITAL_COMMUNITY): Payer: Medicare Other

## 2017-11-07 ENCOUNTER — Observation Stay (HOSPITAL_COMMUNITY)
Admission: EM | Admit: 2017-11-07 | Discharge: 2017-11-09 | Disposition: A | Discharge status: Home / Self Care | Payer: Medicare Other | Attending: Internal Medicine | Admitting: Internal Medicine

## 2017-11-07 DIAGNOSIS — F0281 Dementia in other diseases classified elsewhere with behavioral disturbance: Secondary | ICD-10-CM

## 2017-11-07 DIAGNOSIS — G309 Alzheimer's disease, unspecified: Secondary | ICD-10-CM | POA: Diagnosis not present

## 2017-11-07 DIAGNOSIS — F0390 Unspecified dementia without behavioral disturbance: Secondary | ICD-10-CM | POA: Diagnosis present

## 2017-11-07 DIAGNOSIS — C61 Malignant neoplasm of prostate: Secondary | ICD-10-CM | POA: Diagnosis present

## 2017-11-07 DIAGNOSIS — I712 Thoracic aortic aneurysm, without rupture: Secondary | ICD-10-CM | POA: Diagnosis not present

## 2017-11-07 DIAGNOSIS — R7989 Other specified abnormal findings of blood chemistry: Secondary | ICD-10-CM | POA: Insufficient documentation

## 2017-11-07 DIAGNOSIS — F0280 Dementia in other diseases classified elsewhere without behavioral disturbance: Secondary | ICD-10-CM | POA: Insufficient documentation

## 2017-11-07 DIAGNOSIS — M6282 Rhabdomyolysis: Secondary | ICD-10-CM | POA: Diagnosis not present

## 2017-11-07 DIAGNOSIS — Z8546 Personal history of malignant neoplasm of prostate: Secondary | ICD-10-CM | POA: Insufficient documentation

## 2017-11-07 DIAGNOSIS — E860 Dehydration: Secondary | ICD-10-CM | POA: Insufficient documentation

## 2017-11-07 DIAGNOSIS — D696 Thrombocytopenia, unspecified: Secondary | ICD-10-CM | POA: Diagnosis not present

## 2017-11-07 DIAGNOSIS — R748 Abnormal levels of other serum enzymes: Secondary | ICD-10-CM

## 2017-11-07 DIAGNOSIS — Z66 Do not resuscitate: Secondary | ICD-10-CM | POA: Insufficient documentation

## 2017-11-07 DIAGNOSIS — T07XXXA Unspecified multiple injuries, initial encounter: Secondary | ICD-10-CM

## 2017-11-07 DIAGNOSIS — Z87891 Personal history of nicotine dependence: Secondary | ICD-10-CM | POA: Insufficient documentation

## 2017-11-07 DIAGNOSIS — F419 Anxiety disorder, unspecified: Secondary | ICD-10-CM

## 2017-11-07 LAB — COMPREHENSIVE METABOLIC PANEL
ALT: 31 U/L (ref 0–44)
AST: 87 U/L — ABNORMAL HIGH (ref 15–41)
Albumin: 3.7 g/dL (ref 3.5–5.0)
Alkaline Phosphatase: 71 U/L (ref 38–126)
Anion gap: 12 (ref 5–15)
BILIRUBIN TOTAL: 1.5 mg/dL — AB (ref 0.3–1.2)
BUN: 25 mg/dL — AB (ref 8–23)
CO2: 21 mmol/L — ABNORMAL LOW (ref 22–32)
Calcium: 9.2 mg/dL (ref 8.9–10.3)
Chloride: 109 mmol/L (ref 98–111)
Creatinine, Ser: 1.39 mg/dL — ABNORMAL HIGH (ref 0.61–1.24)
GFR, EST AFRICAN AMERICAN: 54 mL/min — AB (ref >60)
GFR, EST NON AFRICAN AMERICAN: 47 mL/min — AB (ref >60)
Glucose, Bld: 113 mg/dL — ABNORMAL HIGH (ref 70–99)
POTASSIUM: 3.9 mmol/L (ref 3.5–5.1)
Sodium: 142 mmol/L (ref 135–145)
TOTAL PROTEIN: 6.7 g/dL (ref 6.5–8.1)

## 2017-11-07 LAB — CBC WITH DIFFERENTIAL/PLATELET
Abs Immature Granulocytes: 0 10*3/uL (ref 0.0–0.1)
BASOS PCT: 0 %
Basophils Absolute: 0 10*3/uL (ref 0.0–0.1)
EOS ABS: 0 10*3/uL (ref 0.0–0.7)
EOS PCT: 0 %
HEMATOCRIT: 43.8 % (ref 39.0–52.0)
Hemoglobin: 13.7 g/dL (ref 13.0–17.0)
Immature Granulocytes: 0 %
LYMPHS ABS: 0.5 10*3/uL — AB (ref 0.7–4.0)
Lymphocytes Relative: 8 %
MCH: 30.5 pg (ref 26.0–34.0)
MCHC: 31.3 g/dL (ref 30.0–36.0)
MCV: 97.6 fL (ref 78.0–100.0)
MONO ABS: 0.4 10*3/uL (ref 0.1–1.0)
MONOS PCT: 7 %
Neutro Abs: 4.9 10*3/uL (ref 1.7–7.7)
Neutrophils Relative %: 85 %
Platelets: 146 10*3/uL — ABNORMAL LOW (ref 150–400)
RBC: 4.49 MIL/uL (ref 4.22–5.81)
RDW: 14.3 % (ref 11.5–15.5)
WBC: 5.8 10*3/uL (ref 4.0–10.5)

## 2017-11-07 LAB — CK: CK TOTAL: 3438 U/L — AB (ref 49–397)

## 2017-11-07 MED ORDER — ONDANSETRON HCL 4 MG PO TABS: 4.0000 mg | ORAL_TABLET | Freq: Four times a day (QID) | ORAL | Status: DC | PRN

## 2017-11-07 MED ORDER — ACETAMINOPHEN 650 MG RE SUPP: 650.0000 mg | Freq: Four times a day (QID) | RECTAL | Status: DC | PRN

## 2017-11-07 MED ORDER — ONDANSETRON HCL 4 MG/2ML IJ SOLN: 4.0000 mg | Freq: Four times a day (QID) | INTRAMUSCULAR | Status: DC | PRN

## 2017-11-07 MED ORDER — SODIUM CHLORIDE 0.9 % IV BOLUS
1000.0000 mL | Freq: Once | INTRAVENOUS | Status: AC
Start: 2017-11-07 — End: 2017-11-07
  Administered 2017-11-07: 1000 mL via INTRAVENOUS

## 2017-11-07 MED ORDER — DIPHENHYDRAMINE HCL 25 MG PO CAPS
25.0000 mg | ORAL_CAPSULE | Freq: Once | ORAL | Status: AC
  Administered 2017-11-07: 25 mg via ORAL
  Filled 2017-11-07: qty 1

## 2017-11-07 MED ORDER — ENOXAPARIN SODIUM 40 MG/0.4ML ~~LOC~~ SOLN
40.0000 mg | SUBCUTANEOUS | Status: DC
  Administered 2017-11-07: 40 mg via SUBCUTANEOUS
  Filled 2017-11-07: qty 0.4

## 2017-11-07 MED ORDER — SODIUM CHLORIDE 0.9 % IV BOLUS
500.0000 mL | Freq: Once | INTRAVENOUS | Status: AC
Start: 2017-11-07 — End: 2017-11-07
  Administered 2017-11-07: 500 mL via INTRAVENOUS

## 2017-11-07 MED ORDER — SERTRALINE HCL 50 MG PO TABS
50.0000 mg | ORAL_TABLET | Freq: Two times a day (BID) | ORAL | Status: DC
  Administered 2017-11-07 – 2017-11-09 (×4): 50 mg via ORAL
  Filled 2017-11-07 (×5): qty 1

## 2017-11-07 MED ORDER — TRAZODONE HCL 50 MG PO TABS
50.0000 mg | ORAL_TABLET | Freq: Every day | ORAL | Status: DC
  Administered 2017-11-07 – 2017-11-08 (×2): 50 mg via ORAL
  Filled 2017-11-07 (×2): qty 1

## 2017-11-07 MED ORDER — LORAZEPAM 1 MG PO TABS
0.5000 mg | ORAL_TABLET | Freq: Once | ORAL | Status: AC
  Administered 2017-11-07: 0.5 mg via ORAL
  Filled 2017-11-07: qty 1

## 2017-11-07 MED ORDER — SODIUM CHLORIDE 0.9 % IV SOLN
INTRAVENOUS | Status: AC
  Administered 2017-11-07 – 2017-11-08 (×2): via INTRAVENOUS

## 2017-11-07 MED ORDER — ACETAMINOPHEN 325 MG PO TABS: 650.0000 mg | ORAL_TABLET | Freq: Four times a day (QID) | ORAL | Status: DC | PRN
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
## 2017-11-07 NOTE — ED Notes (Signed)
ED Provider at bedside. 
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
## 2017-11-08 ENCOUNTER — Ambulatory Visit: Payer: Self-pay | Admitting: Internal Medicine

## 2017-11-08 DIAGNOSIS — F0280 Dementia in other diseases classified elsewhere without behavioral disturbance: Secondary | ICD-10-CM | POA: Diagnosis not present

## 2017-11-08 DIAGNOSIS — R7989 Other specified abnormal findings of blood chemistry: Secondary | ICD-10-CM | POA: Diagnosis not present

## 2017-11-08 DIAGNOSIS — M6282 Rhabdomyolysis: Principal | ICD-10-CM

## 2017-11-08 DIAGNOSIS — F0390 Unspecified dementia without behavioral disturbance: Secondary | ICD-10-CM | POA: Diagnosis not present

## 2017-11-08 DIAGNOSIS — E860 Dehydration: Secondary | ICD-10-CM | POA: Diagnosis not present

## 2017-11-08 DIAGNOSIS — R748 Abnormal levels of other serum enzymes: Secondary | ICD-10-CM | POA: Diagnosis not present

## 2017-11-08 LAB — CBC WITH DIFFERENTIAL/PLATELET
Abs Immature Granulocytes: 0 10*3/uL (ref 0.0–0.1)
BASOS PCT: 0 %
Basophils Absolute: 0 10*3/uL (ref 0.0–0.1)
EOS ABS: 0.1 10*3/uL (ref 0.0–0.7)
EOS PCT: 3 %
HCT: 35.5 % — ABNORMAL LOW (ref 39.0–52.0)
Hemoglobin: 11.8 g/dL — ABNORMAL LOW (ref 13.0–17.0)
Immature Granulocytes: 0 %
Lymphocytes Relative: 23 %
Lymphs Abs: 1.2 10*3/uL (ref 0.7–4.0)
MCH: 31.1 pg (ref 26.0–34.0)
MCHC: 33.2 g/dL (ref 30.0–36.0)
MCV: 93.4 fL (ref 78.0–100.0)
MONO ABS: 0.5 10*3/uL (ref 0.1–1.0)
MONOS PCT: 10 %
Neutro Abs: 3.4 10*3/uL (ref 1.7–7.7)
Neutrophils Relative %: 64 %
PLATELETS: 129 10*3/uL — AB (ref 150–400)
RBC: 3.8 MIL/uL — ABNORMAL LOW (ref 4.22–5.81)
RDW: 14.4 % (ref 11.5–15.5)
WBC: 5.2 10*3/uL (ref 4.0–10.5)

## 2017-11-08 LAB — COMPREHENSIVE METABOLIC PANEL
ALBUMIN: 3 g/dL — AB (ref 3.5–5.0)
ALK PHOS: 55 U/L (ref 38–126)
ALT: 28 U/L (ref 0–44)
AST: 111 U/L — ABNORMAL HIGH (ref 15–41)
Anion gap: 8 (ref 5–15)
BILIRUBIN TOTAL: 1 mg/dL (ref 0.3–1.2)
BUN: 20 mg/dL (ref 8–23)
CO2: 23 mmol/L (ref 22–32)
CREATININE: 1.17 mg/dL (ref 0.61–1.24)
Calcium: 8.5 mg/dL — ABNORMAL LOW (ref 8.9–10.3)
Chloride: 113 mmol/L — ABNORMAL HIGH (ref 98–111)
GFR calc Af Amer: >60 mL/min (ref >60)
GFR, EST NON AFRICAN AMERICAN: 57 mL/min — AB (ref >60)
GLUCOSE: 89 mg/dL (ref 70–99)
Potassium: 3.6 mmol/L (ref 3.5–5.1)
Sodium: 144 mmol/L (ref 135–145)
Total Protein: 5.6 g/dL — ABNORMAL LOW (ref 6.5–8.1)

## 2017-11-08 LAB — TROPONIN I: Troponin I: <0.03 ng/mL (ref <0.03)

## 2017-11-08 LAB — CK: Total CK: 3887 U/L — ABNORMAL HIGH (ref 49–397)

## 2017-11-08 MED ORDER — CAMPHOR-MENTHOL 0.5-0.5 % EX LOTN
TOPICAL_LOTION | CUTANEOUS | Status: DC | PRN
  Filled 2017-11-08: qty 222

## 2017-11-08 MED ORDER — HALOPERIDOL LACTATE 5 MG/ML IJ SOLN: 1.0000 mg | Freq: Once | INTRAMUSCULAR | Status: DC

## 2017-11-08 NOTE — Progress Notes (Signed)
CSW left voicemail for patient's son to discuss discharge planning.   Percell Locus Westyn Keatley LCSW [phone]
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
## 2017-11-08 NOTE — Progress Notes (Signed)
Patient admitted to 5W 9 with the diagnosis of Rhabdomyolysis. Alert and oriented to self. No s/s of pain noted. Full assessment to epic not completed due to confusion and will  be done when the family members gets to the bedside. Will continue to monitor.
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
## 2017-11-09 DIAGNOSIS — T07XXXA Unspecified multiple injuries, initial encounter: Secondary | ICD-10-CM

## 2017-11-09 DIAGNOSIS — D696 Thrombocytopenia, unspecified: Secondary | ICD-10-CM

## 2017-11-09 DIAGNOSIS — R748 Abnormal levels of other serum enzymes: Secondary | ICD-10-CM | POA: Diagnosis not present

## 2017-11-09 DIAGNOSIS — M6282 Rhabdomyolysis: Secondary | ICD-10-CM | POA: Diagnosis not present

## 2017-11-09 DIAGNOSIS — F0391 Unspecified dementia with behavioral disturbance: Secondary | ICD-10-CM | POA: Diagnosis not present

## 2017-11-09 LAB — CBC
HEMATOCRIT: 35.6 % — AB (ref 39.0–52.0)
HEMOGLOBIN: 11.8 g/dL — AB (ref 13.0–17.0)
MCH: 30.6 pg (ref 26.0–34.0)
MCHC: 33.1 g/dL (ref 30.0–36.0)
MCV: 92.5 fL (ref 78.0–100.0)
Platelets: 121 10*3/uL — ABNORMAL LOW (ref 150–400)
RBC: 3.85 MIL/uL — ABNORMAL LOW (ref 4.22–5.81)
RDW: 14.3 % (ref 11.5–15.5)
WBC: 4.4 10*3/uL (ref 4.0–10.5)

## 2017-11-09 LAB — COMPREHENSIVE METABOLIC PANEL
ALK PHOS: 53 U/L (ref 38–126)
ALT: 34 U/L (ref 0–44)
AST: 101 U/L — AB (ref 15–41)
Albumin: 3 g/dL — ABNORMAL LOW (ref 3.5–5.0)
Anion gap: 8 (ref 5–15)
BILIRUBIN TOTAL: 0.8 mg/dL (ref 0.3–1.2)
BUN: 15 mg/dL (ref 8–23)
CALCIUM: 8.4 mg/dL — AB (ref 8.9–10.3)
CO2: 23 mmol/L (ref 22–32)
CREATININE: 1.04 mg/dL (ref 0.61–1.24)
Chloride: 113 mmol/L — ABNORMAL HIGH (ref 98–111)
GFR calc Af Amer: >60 mL/min (ref >60)
Glucose, Bld: 92 mg/dL (ref 70–99)
POTASSIUM: 3.4 mmol/L — AB (ref 3.5–5.1)
Sodium: 144 mmol/L (ref 135–145)
TOTAL PROTEIN: 5.5 g/dL — AB (ref 6.5–8.1)

## 2017-11-09 LAB — CK: Total CK: 2174 U/L — ABNORMAL HIGH (ref 49–397)

## 2017-11-09 MED ORDER — CAMPHOR-MENTHOL 0.5-0.5 % EX LOTN: TOPICAL_LOTION | CUTANEOUS | 0 refills | Status: DC | PRN

## 2017-11-09 MED ORDER — SERTRALINE HCL 100 MG PO TABS: 50.0000 mg | ORAL_TABLET | Freq: Two times a day (BID) | ORAL | Status: DC

## 2017-11-09 NOTE — Discharge Instructions (Signed)
24/7 supervision

## 2017-11-09 NOTE — Care Management Note (Signed)
Case Management Note  Patient Details  Name: Luisenrique Conran MRN: [id number] Date of Birth: [birth year]  Subjective/Objective:         Rhabdomylosis, hx of advanced dementia, ascending aortic aneurysm and previous history of prostate cancer.      Latham Kinzler (Son)      [phone]       Action/Plan: Per PT's recommendation: SNF. Pt/son declined SNF placement. States his caregivers will assist him with his needs @ d/c, declines home health services. Son will provide transportation to home.  Expected Discharge Date:  11/09/17               Expected Discharge Plan:  Home/Self Care  In-House Referral:     Discharge planning Services  CM Consult  Post Acute Care Choice:    Choice offered to:     DME Arranged:    DME Agency:     HH Arranged:   declined [place] Agency:     Status of Service:  Completed, signed off  If discussed at [employer], dates discussed:    Additional Comments:  Sharin Mons, RN 11/09/2017, 11:57 AM

## 2017-11-09 NOTE — Progress Notes (Signed)
CSW received consult regarding PT recommendation of SNF at discharge.  Patient's family are taking the patient home with 24 hour/day caregivers. RNCM aware.  CSW signing off.   Percell Locus Melani Brisbane LCSW [phone]

## 2017-11-09 NOTE — Progress Notes (Signed)
Patient discharge to home with 24 hour care. IV removed by patient last night. Patients A&O to self and stable. AVS given and reviewed with  patients caregiver. Patient escorted via wheel chair by son and caregiver. Discharged home via private care.

## 2017-11-09 NOTE — Discharge Summary (Addendum)
Physician Discharge Summary  Ricky Boyd XNT:[id number] DOB: 05/18/1940 DOA: 11/07/2017  PCP: Lauree Chandler, NP  Admit date: 11/07/2017 Discharge date: 11/09/2017  Admitted From: Home  Disposition: Home  Recommendations for Outpatient Follow-up:  1. Follow up with PCP next week with CBC, CMET and CK  2. Push oral fluids  Home Health:  Yes with 24/7 caregivers Equipment/Devices: None   Discharge Condition: Stable  CODE STATUS:  DNR    Diet recommendation:   Heart Healthy   Brief/Interim Summary:   77 year old male with a history of advanced dementia behavioral medications with Seroquel and trazodone, history of AAA, prior history of prostate cancer, who was found around 5 PMon 11/07/2017  lying on the floor after he wandered off to the woods.  Apparently, home nursing staff (has 24/7 caregiving) reports that this is a second episode happening within the last 1 week.  CT of the head at the ER, was unremarkable.  EKG showed normal sinus rhythm, labs showed a CK of 3400 . Repeat CK is decreasing to 2100 today after generous IVF .    Rhabdomyolysis.    CK was 3400, currently at  2100 from 3887 yesterday  CT of the head was unremarkable.  EKG normal sinus rhythm.  Discharge to home with repeat CK as outpatient in 1 week, along with CBC and CMET   Push oral fluids     Dehydration (elevated BUN from baseline) with elevated LFTs. Received generous IVF with good response  trend LFTs as outpatient    History of Dementia, with recent events as above, having wandered off from the house, found in the woods Continue home meds -family plans to get an ankle monitor for him to track his location    Thrombocytopenia Current at 121k  possibly likely due to  Liver disease,, history of ETOH, and dilution due to IVF. No bleeding issues are noted    No transfusion is indicated at this time Follow CBC as outpatient     Discharge Diagnoses:  Principal Problem:   Rhabdomyolysis Active  Problems:   Malignant neoplasm of prostate ([place])   Dementia   Thrombocytopenia (HCC)    Discharge Instructions  Discharge Instructions    Diet - low sodium heart healthy   Complete by:  As directed    Increase activity slowly   Complete by:  As directed    Walk with assistance   Complete by:  As directed      Allergies as of 11/09/2017      Reactions   Hydrocodone Other (See Comments)   Reaction:  Hallucinations   Indocin [indomethacin] Other (See Comments)   Reaction:  Confusion    Amoxicillin-pot Clavulanate Other (See Comments)   Reaction:  Nightmares Has patient had a PCN reaction causing immediate rash, facial/tongue/throat swelling, SOB or lightheadedness with hypotension: No Has patient had a PCN reaction causing severe rash involving mucus membranes or skin necrosis: No Has patient had a PCN reaction that required hospitalization: No Has patient had a PCN reaction occurring within the last 10 years: Yes If all of the above answers are "NO", then may proceed with Cephalosporin use.   Erythromycin Ethylsuccinate Other (See Comments)   Reaction:  Eye irritation       Medication List    STOP taking these medications   LORazepam 0.5 MG tablet Commonly known as:  ATIVAN   QUEtiapine 25 MG tablet Commonly known as:  SEROQUEL     TAKE these medications   camphor-menthol lotion Commonly  known as:  SARNA Apply topically as needed for itching.   neomycin-bacitracin-polymyxin Oint Commonly known as:  NEOSPORIN Apply 1 application topically 2 (two) times daily as needed for wound care.   sertraline 100 MG tablet Commonly known as:  ZOLOFT Take 0.5 tablets (50 mg total) by mouth 2 (two) times daily.   traZODone 50 MG tablet Commonly known as:  DESYREL Take 1 tablet (50 mg total) by mouth at bedtime.       Allergies  Allergen Reactions  . Hydrocodone Other (See Comments)    Reaction:  Hallucinations  . Indocin [Indomethacin] Other (See Comments)     Reaction:  Confusion   . Amoxicillin-Pot Clavulanate Other (See Comments)    Reaction:  Nightmares Has patient had a PCN reaction causing immediate rash, facial/tongue/throat swelling, SOB or lightheadedness with hypotension: No Has patient had a PCN reaction causing severe rash involving mucus membranes or skin necrosis: No Has patient had a PCN reaction that required hospitalization: No Has patient had a PCN reaction occurring within the last 10 years: Yes If all of the above answers are "NO", then may proceed with Cephalosporin use.  . Erythromycin Ethylsuccinate Other (See Comments)    Reaction:  Eye irritation     Consultations:   Physician/Group: None   Procedures/Studies: Ct Head Wo Contrast  Result Date: 11/07/2017 CLINICAL DATA:  Altered mental status EXAM: CT HEAD WITHOUT CONTRAST TECHNIQUE: Contiguous axial images were obtained from the base of the skull through the vertex without intravenous contrast. COMPARISON:  CT brain 09/17/2016, MRI 11/07/2014 FINDINGS: Brain: Mild motion degradation. No acute territorial infarction, hemorrhage or intracranial mass. Atrophy and mild small vessel ischemic changes of the white matter. Stable ventricle size. Vascular: No hyperdense vessels.  Carotid vascular calcification. Skull: Normal. Negative for fracture or focal lesion. Sinuses/Orbits: Mucosal thickening in the ethmoid sinuses. No acute orbital abnormality. Other: None IMPRESSION: 1. No CT evidence for acute intracranial abnormality. 2. Atrophy and mild small vessel ischemic changes of the white matter. Electronically Signed   By: Donavan Foil M.D.   On: 11/07/2017 21:14      Subjective: Patient awake, more alert, wants to go home. Had insomnia overnight. Denies new complaints. Tolerating oral fluids and food. Has been ambulating with PT   Discharge Exam: Vitals:   11/08/17 2056 11/09/17 0559  BP: 112/70 130/68  Pulse: (!) 57 (!) 58  Resp: 18 17  Temp: 98.5 F (36.9 C) 98 F  (36.7 C)  SpO2: 98% 98%   Vitals:   11/08/17 0548 11/08/17 1534 11/08/17 2056 11/09/17 0559  BP: 102/67 105/63 112/70 130/68  Pulse: 61 (!) 57 (!) 57 (!) 58  Resp: 18 18 18 17   Temp: 98 F (36.7 C) 98 F (36.7 C) 98.5 F (36.9 C) 98 F (36.7 C)  TempSrc: Oral Oral Oral Oral  SpO2: 95% 97% 98% 98%  Weight:        General: Pt is alert, awake, not in acute distress. speech garbled at times, but appears to be his normal  Cardiovascular: RRR, S1/S2 +, no rubs, no gallops Respiratory: CTA bilaterally, no wheezing, no rhonchi Abdominal: Soft, NT, ND, bowel sounds + Extremities: no edema, no cyanosis    The results of significant diagnostics from this hospitalization (including imaging, microbiology, ancillary and laboratory) are listed below for reference.     Microbiology: No results found for this or any previous visit (from the past 240 hour(s)).   Labs: BNP (last 3 results) No results for input(s): BNP  in the last 8760 hours. Basic Metabolic Panel: Recent Labs  Lab 11/07/17 1715 11/08/17 0341 11/09/17 0501  NA 142 144 144  K 3.9 3.6 3.4*  CL 109 113* 113*  CO2 21* 23 23  GLUCOSE 113* 89 92  BUN 25* 20 15  CREATININE 1.39* 1.17 1.04  CALCIUM 9.2 8.5* 8.4*   Liver Function Tests: Recent Labs  Lab 11/07/17 1715 11/08/17 0341 11/09/17 0501  AST 87* 111* 101*  ALT 31 28 34  ALKPHOS 71 55 53  BILITOT 1.5* 1.0 0.8  PROT 6.7 5.6* 5.5*  ALBUMIN 3.7 3.0* 3.0*   No results for input(s): LIPASE, AMYLASE in the last 168 hours. No results for input(s): AMMONIA in the last 168 hours. CBC: Recent Labs  Lab 11/07/17 1715 11/08/17 0341 11/09/17 0501  WBC 5.8 5.2 4.4  NEUTROABS 4.9 3.4  --   HGB 13.7 11.8* 11.8*  HCT 43.8 35.5* 35.6*  MCV 97.6 93.4 92.5  PLT 146* 129* 121*   Cardiac Enzymes: Recent Labs  Lab 11/07/17 1715 11/08/17 0341 11/08/17 1017 11/09/17 0501  CKTOTAL 3,438* 3,887*  --  2,174*  TROPONINI  --   --  <0.03  --    BNP: Invalid  input(s): POCBNP CBG: No results for input(s): GLUCAP in the last 168 hours. D-Dimer No results for input(s): DDIMER in the last 72 hours. Hgb A1c No results for input(s): HGBA1C in the last 72 hours. Lipid Profile No results for input(s): CHOL, HDL, LDLCALC, TRIG, CHOLHDL, LDLDIRECT in the last 72 hours. Thyroid function studies No results for input(s): TSH, T4TOTAL, T3FREE, THYROIDAB in the last 72 hours.  Invalid input(s): FREET3 Anemia work up No results for input(s): VITAMINB12, FOLATE, FERRITIN, TIBC, IRON, RETICCTPCT in the last 72 hours. Urinalysis    Component Value Date/Time   COLORURINE DARK YELLOW 09/21/2017 1208   APPEARANCEUR CLOUDY (A) 09/21/2017 1208   LABSPEC 1.023 09/21/2017 1208   PHURINE 5.5 09/21/2017 1208   GLUCOSEU NEGATIVE 09/21/2017 1208   HGBUR 1+ (A) 09/21/2017 1208   BILIRUBINUR neg 03/24/2017 1534   KETONESUR NEGATIVE 09/21/2017 1208   PROTEINUR 1+ (A) 09/21/2017 1208   UROBILINOGEN 0.2 03/24/2017 1534   UROBILINOGEN 0.2 07/21/2013 1119   NITRITE NEGATIVE 09/21/2017 1208   LEUKOCYTESUR 3+ (A) 09/21/2017 1208   Sepsis Labs Invalid input(s): PROCALCITONIN,  WBC,  LACTICIDVEN Microbiology No results found for this or any previous visit (from the past 240 hour(s)).   Time coordinating discharge: Over 30 minutes  SIGNED:   Geradine Girt, DO  [facility] 11/09/2017, 11:54 AM   If 7PM-7AM, please contact night-coverage [email or web address] Password [email or web address]

## 2017-11-10 ENCOUNTER — Telehealth: Payer: Self-pay

## 2017-11-10 ENCOUNTER — Ambulatory Visit: Payer: Medicare Other | Admitting: Internal Medicine

## 2017-11-10 NOTE — Telephone Encounter (Signed)
Transition Care Management Follow-up Telephone Call  Date of discharge and from where: [facility] on 11/09/2017  How have you been since you were released from the hospital? Per patient son he has been slightly confused, but other than that he has been doing well  Any questions or concerns? No   Items Reviewed:  Did the pt receive and understand the discharge instructions provided? Yes   Medications obtained and verified? Yes   Any new allergies since your discharge? No   Dietary orders reviewed? Yes  Do you have support at home? Yes , son lives with him and he has 2 caregivers  Other (ie: DME, Home Health, etc) N/A  Functional Questionnaire: (I = Independent and D = Dependent) ADL's: I w/ assist  Bathing/Dressing- I w/ assist   Meal Prep- D  Eating- I  Maintaining continence- D  Transferring/Ambulation- I  Managing Meds-D   Follow up appointments reviewed:    PCP Hospital f/u appt confirmed? Yes  Scheduled to see Dinah, NP on 11/18/2017 @ 1:00pm.  [facility] f/u appt confirmed? N/A   Are transportation arrangements needed? No   If their condition worsens, is the pt aware to call  their PCP or go to the ED? Yes  Was the patient provided with contact information for the PCP's office or ED? Yes  Was the pt encouraged to call back with questions or concerns? Yes

## 2017-11-18 ENCOUNTER — Encounter: Payer: Medicare Other | Admitting: Family

## 2017-12-06 IMAGING — CT CT HIP*R* W/O CM
2 of 3 series · 17 of 46 positions shown, 19 images · non-contrast
Comparison: Bilateral hip radiographs 09/19/2016. Pelvic CT
09/16/2016.

CLINICAL DATA: Severe right hip pain after falling yesterday.
Dementia and urosepsis.

EXAM:
CT OF THE RIGHT HIP WITHOUT CONTRAST
TECHNIQUE: Multidetector CT imaging of the right hip was performed according to
the standard protocol. Multiplanar CT image reconstructions were
also generated.

[Series 4: axial st · axial · 0.41mm/px · z∈[-181,-19]mm · 14 of 95 slices shown, 16 images]
[im 7/95  soft-tissue]
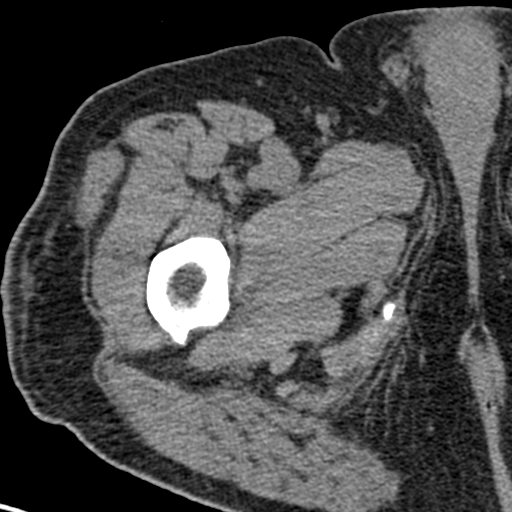
[im 7/95  bone]
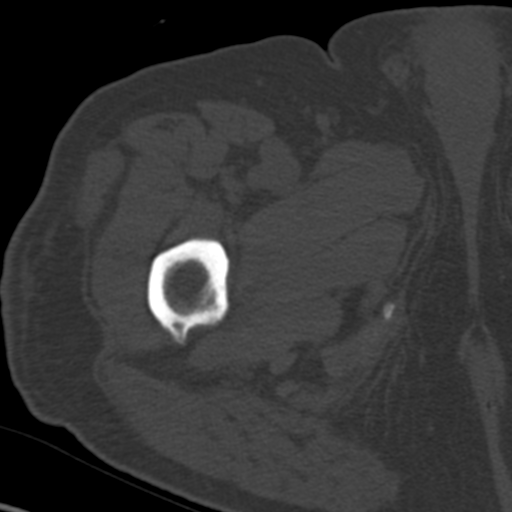
[im 13/95  soft-tissue]
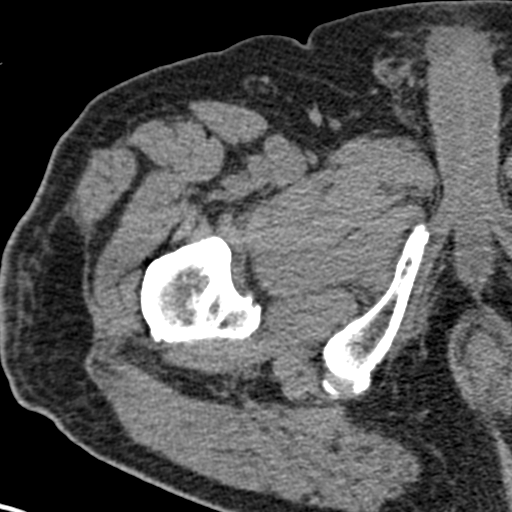
[im 19/95  soft-tissue]
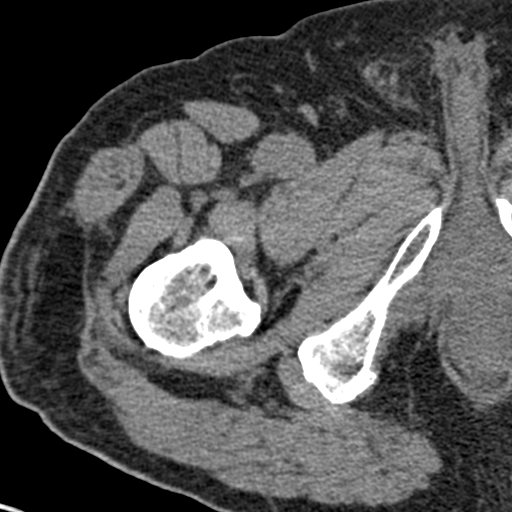
[im 25/95  soft-tissue]
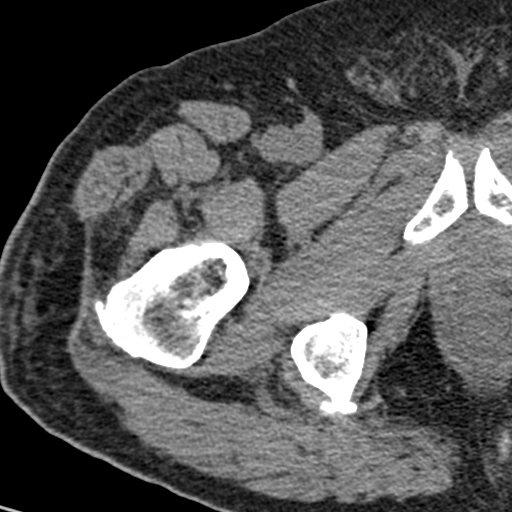
[im 31/95  soft-tissue]
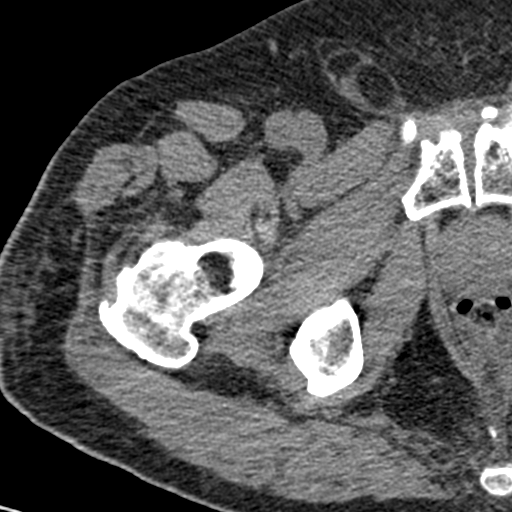
[im 37/95  soft-tissue]
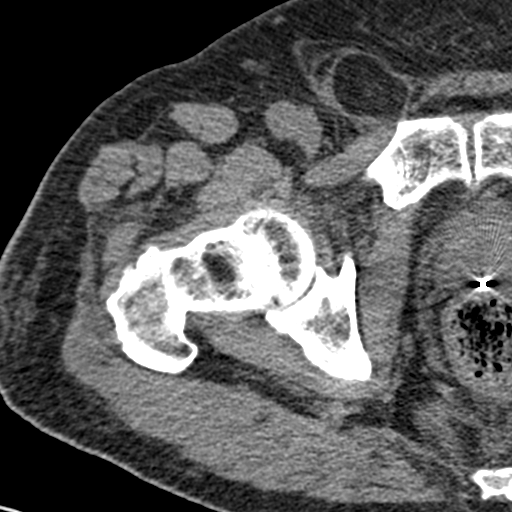
[im 43/95  soft-tissue]
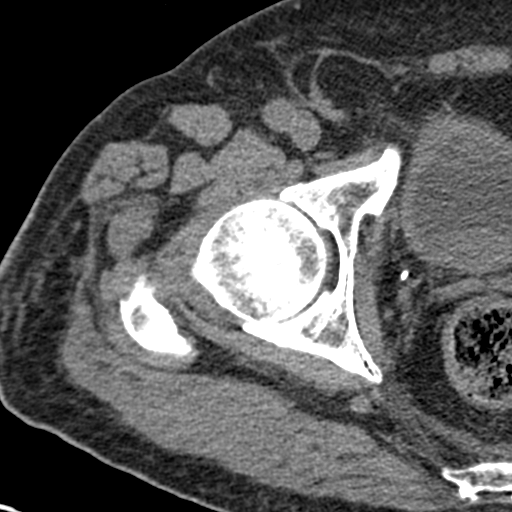
[im 52/95  soft-tissue]
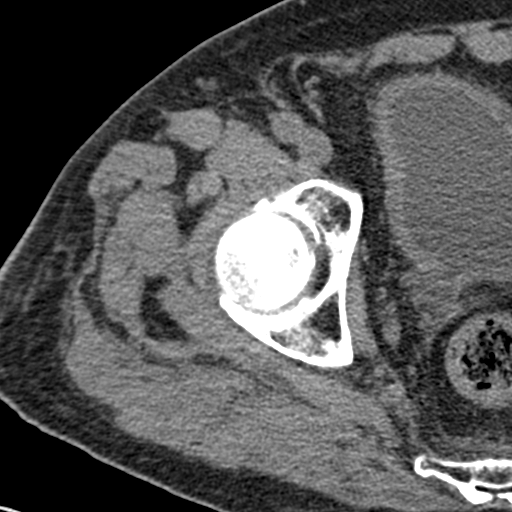
[im 58/95  soft-tissue]
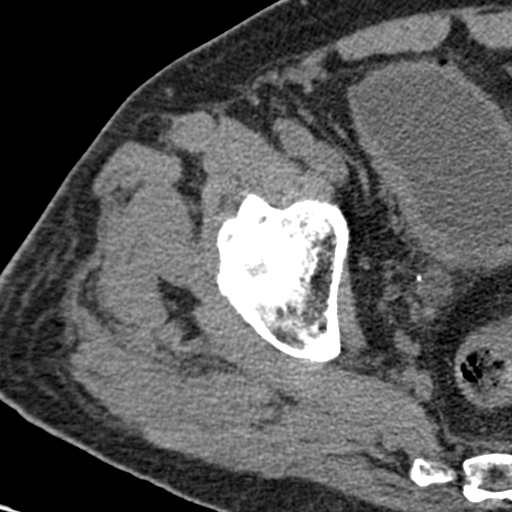
[im 58/95  bone]
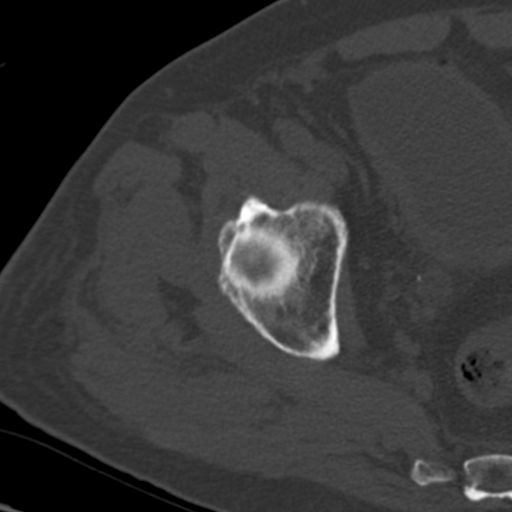
[im 64/95  soft-tissue]
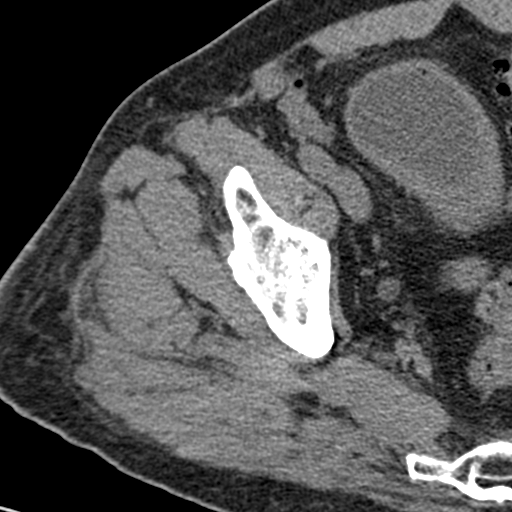
[im 70/95  soft-tissue]
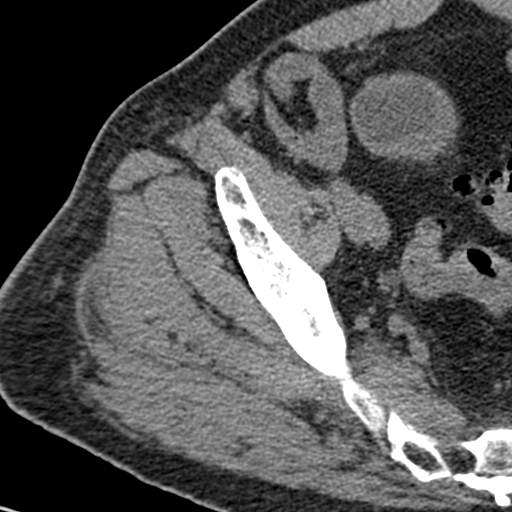
[im 76/95  soft-tissue]
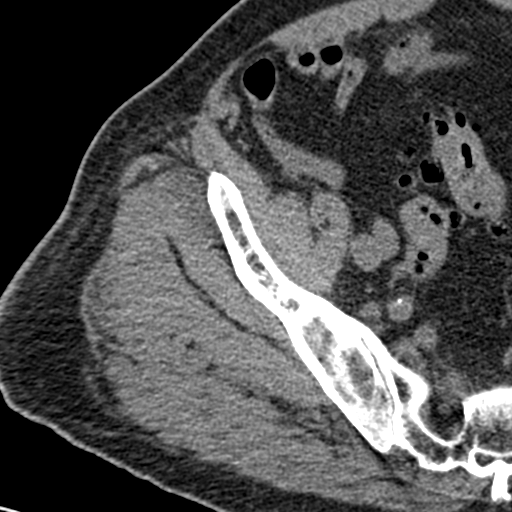
[im 82/95  soft-tissue]
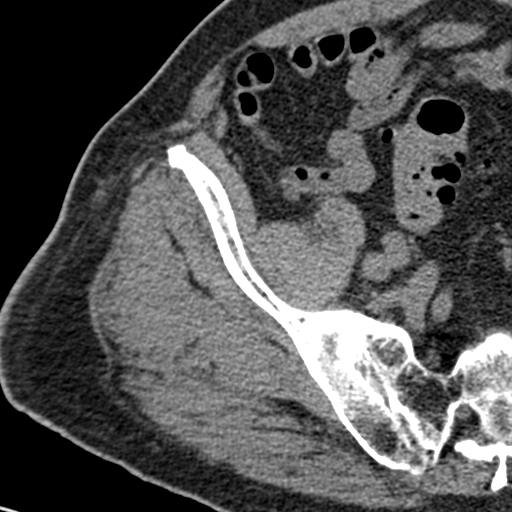
[im 88/95  soft-tissue]
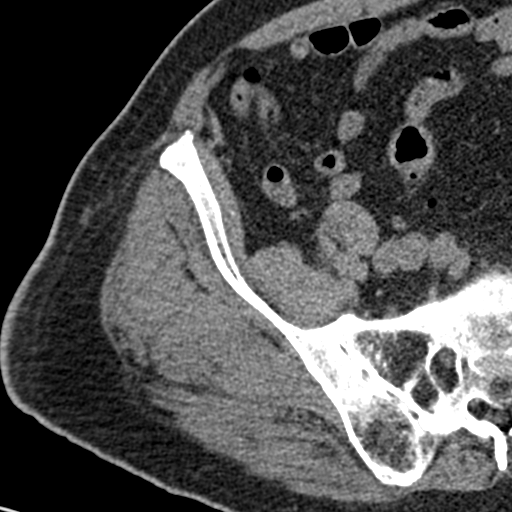

[Series 7: coronal st · coronal · 0.38mm/px · 3 of 85 slices shown]
[im 29/85  soft-tissue]
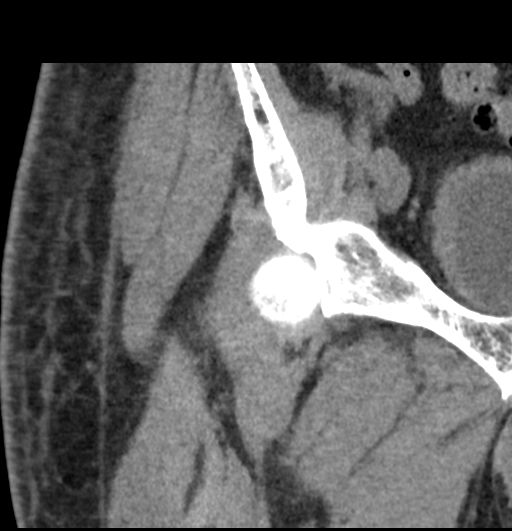
[im 38/85  soft-tissue]
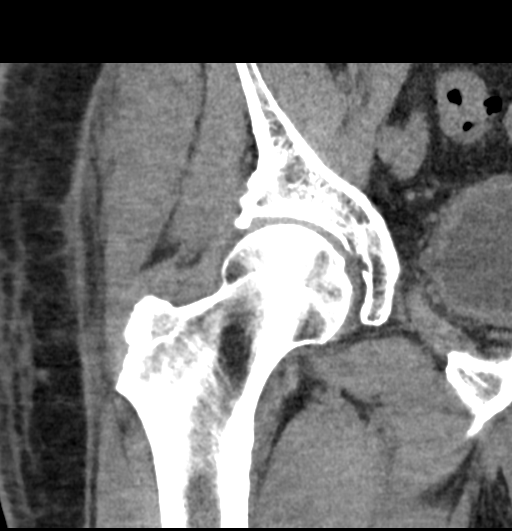
[im 47/85  soft-tissue]
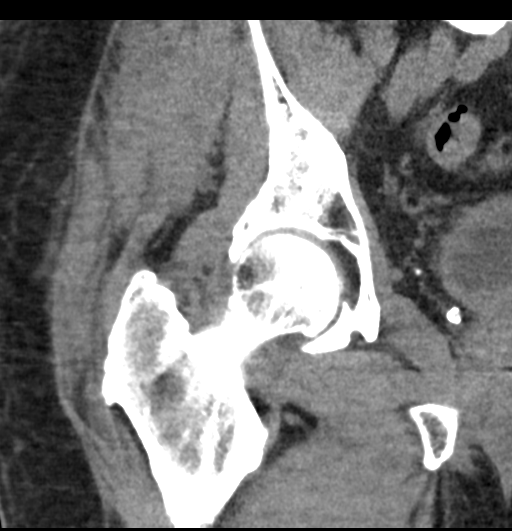

[17 of 46 positions shown; findings below may reference images not displayed]

FINDINGS: Bones/Joint/Cartilage

Examination is limited to the right hip and inferior right
hemipelvis. There is no evidence of acute fracture or dislocation.
There are minimal degenerative changes at the right hip. The right
sacroiliac joint is chronically ankylosed. No significant hip joint
effusion.

Ligaments

Not relevant for exam/indication.

Muscles and Tendons

The right hip periarticular musculature and tendons appear
unremarkable.

Soft tissues

Mild subcutaneous edema lateral to the right hip. No focal hematoma.
Right inguinal hernia containing only fat appears unchanged.
Brachytherapy seeds are present within the prostate gland. The
bladder is thick walled with small diverticula. There is a small
amount of air within the bladder lumen, likely iatrogenic. Sigmoid
diverticular changes are present.
IMPRESSION: 1. No evidence of acute right hip fracture or dislocation.
2. No periarticular hematoma.  Mild subcutaneous edema laterally.
3. Air in the bladder lumen, presumed iatrogenic. Correlate
clinically. Underlying bladder wall thickening and diverticula again
noted.

## 2017-12-06 IMAGING — DX DG KNEE COMPLETE 4+V*R*
4 series · 4 of 4 positions shown · non-contrast
Comparison: None

CLINICAL DATA: Right knee pain, fall.

EXAM:
RIGHT KNEE - COMPLETE 4+ VIEW

[knee ap]
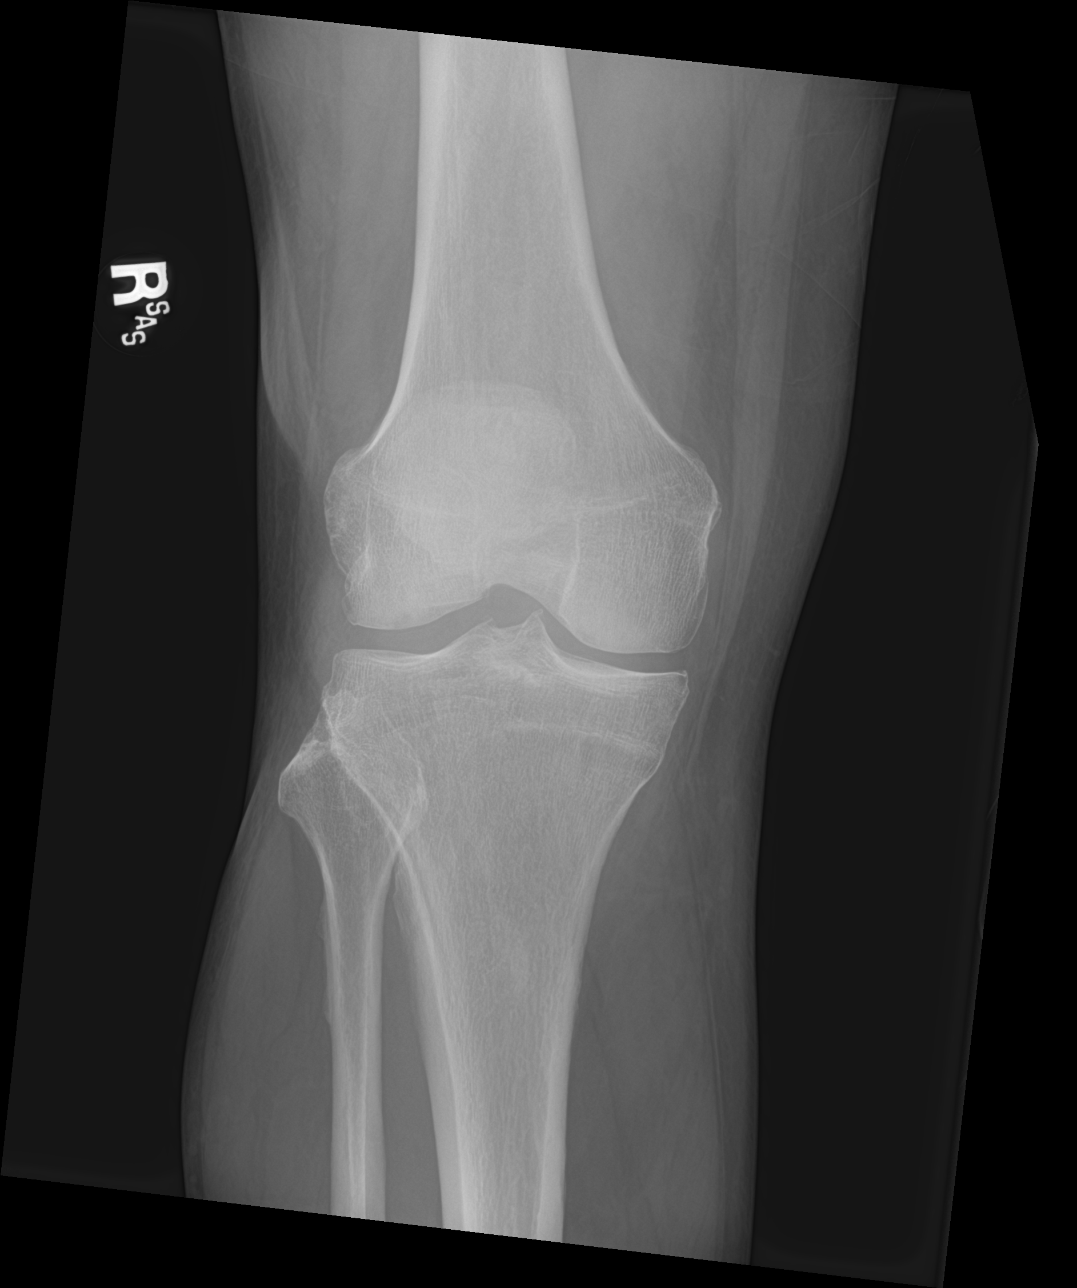

[knee obl (1 of 2)]
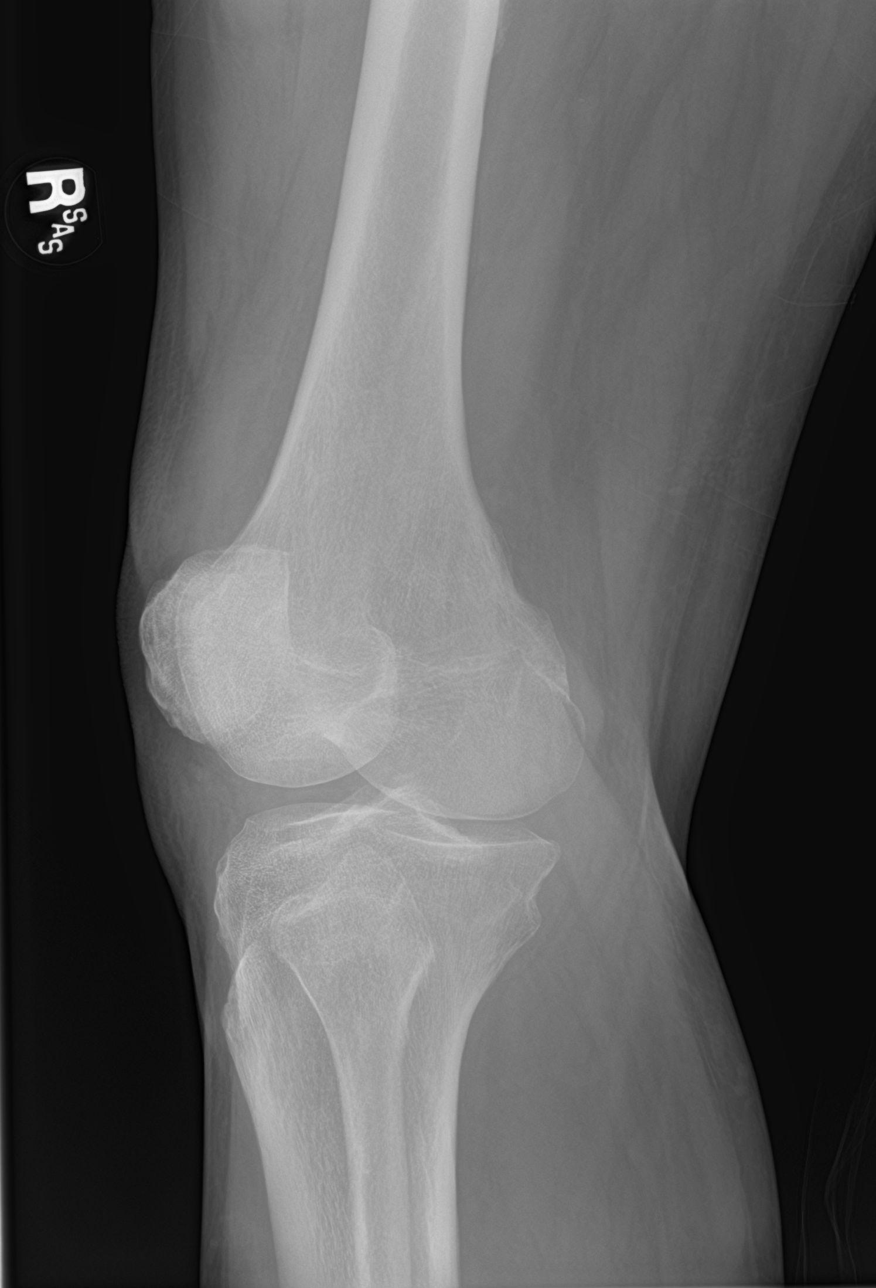

[knee obl (2 of 2)]
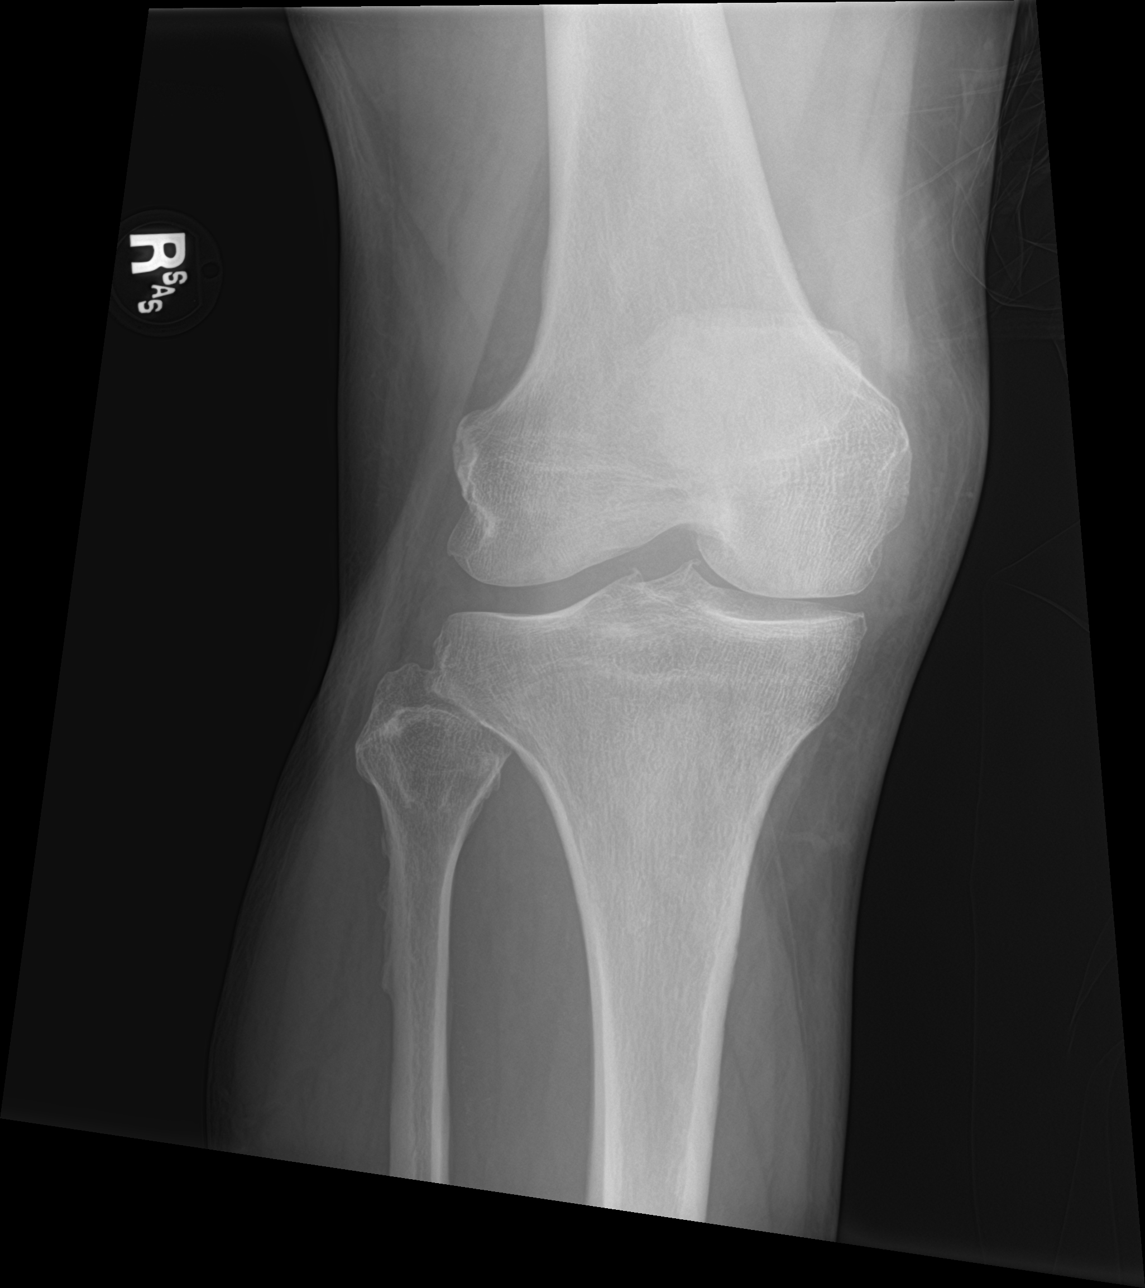

[knee lat]
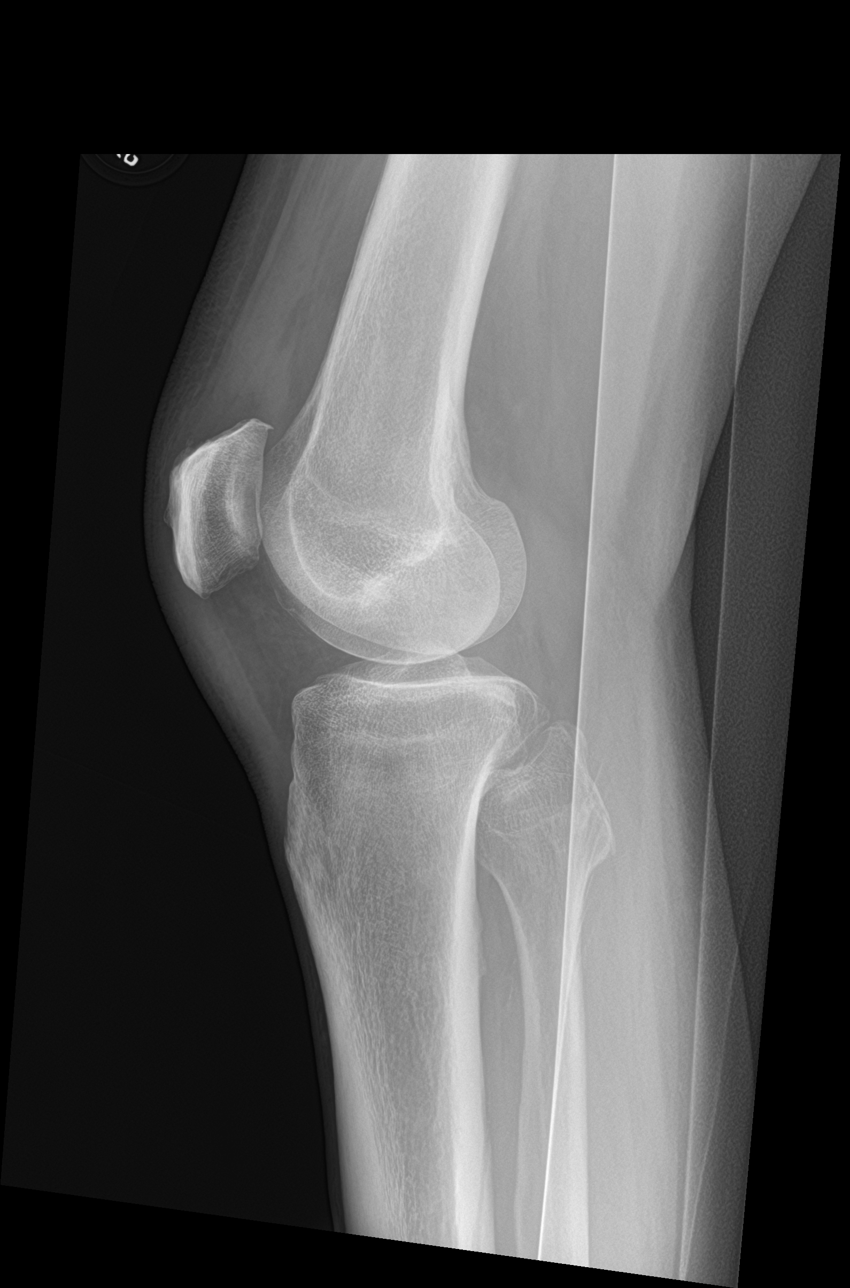

[4 of 4 positions shown; findings below may reference images not displayed]

FINDINGS: Early spurring. No acute bony abnormality. Specifically, no
fracture, subluxation, or dislocation. Soft tissues are intact.
Trace joint effusion.
IMPRESSION: No acute bony abnormality.

## 2018-01-10 ENCOUNTER — Ambulatory Visit (INDEPENDENT_AMBULATORY_CARE_PROVIDER_SITE_OTHER): Payer: Medicare Other | Admitting: Nurse Practitioner

## 2018-01-10 ENCOUNTER — Encounter: Payer: Self-pay | Admitting: Nurse Practitioner

## 2018-01-10 ENCOUNTER — Telehealth: Payer: Self-pay

## 2018-01-10 VITALS — BP 118/82 | HR 84 | Temp 98.4°F | Ht 69.0 in | Wt 184.0 lb

## 2018-01-10 DIAGNOSIS — R451 Restlessness and agitation: Secondary | ICD-10-CM

## 2018-01-10 DIAGNOSIS — G4700 Insomnia, unspecified: Secondary | ICD-10-CM | POA: Diagnosis not present

## 2018-01-10 DIAGNOSIS — G309 Alzheimer's disease, unspecified: Secondary | ICD-10-CM

## 2018-01-10 DIAGNOSIS — F419 Anxiety disorder, unspecified: Secondary | ICD-10-CM | POA: Diagnosis not present

## 2018-01-10 DIAGNOSIS — F0281 Dementia in other diseases classified elsewhere with behavioral disturbance: Secondary | ICD-10-CM

## 2018-01-10 NOTE — Telephone Encounter (Signed)
Patient was in office to see Lauree Chandler, NP for behavioral changes.  Jessica requested POC Urinalysis. Patient was unable to obtain urine sample. Per Lauree Chandler, NP give patient a specimen collection cup to take home. [name] will return specimen when patient is able to collect.  POC Urinalysis to be associated with agitation per Janett Billow when returned.

## 2018-01-10 NOTE — Progress Notes (Signed)
Careteam: Patient Care Team: Lauree Chandler, NP as PCP - General (Geriatric Medicine) Pieter Partridge, DO as Consulting Physician (Neurology)  Advanced Directive information Does Patient Have a Medical Advance Directive?: Yes, Type of Advance Directive: [place];Living will;Out of facility DNR (pink MOST or yellow form), Pre-existing out of facility DNR order (yellow form or pink MOST form): Yellow form placed in chart (order not valid for inpatient use)  Allergies  Allergen Reactions  . Hydrocodone Other (See Comments)    Reaction:  Hallucinations  . Indocin [Indomethacin] Other (See Comments)    Reaction:  Confusion   . Amoxicillin-Pot Clavulanate Other (See Comments)    Reaction:  Nightmares Has patient had a PCN reaction causing immediate rash, facial/tongue/throat swelling, SOB or lightheadedness with hypotension: No Has patient had a PCN reaction causing severe rash involving mucus membranes or skin necrosis: No Has patient had a PCN reaction that required hospitalization: No Has patient had a PCN reaction occurring within the last 10 years: Yes If all of the above answers are "NO", then may proceed with Cephalosporin use.  . Erythromycin Ethylsuccinate Other (See Comments)    Reaction:  Eye irritation     Chief Complaint  Patient presents with  . Acute Visit    Increased agitation, sleeps excessively, confusion     HPI: Patient is a 77 y.o. male seen in the office today due to acute confusion.  Son reports last week he had issue were he was very confused/ agitated and sleeping excessively.  Last abt a day in a half.  Uses 1/2 zoloft in the am and 1/2 in the evening. zoloft will make him sleepy but not everyday.  Gave him seroquel 25 mg (uses just as needed- has a supply) otherwise too sedating.   Son wanted to come   Hx of UTI- behaviors remind him of how he has been acting.   Hard to get him to drink water- mixing juice with water to help.     Recent visit to the ED, he wondered off and stay in a field over night, son could not find him and he was dehydrated.   Has male caregivers which he does not do well with. Sometimes more agitated.   Review of Systems:  Review of Systems  Unable to perform ROS: Dementia    Past Medical History:  Diagnosis Date  . Alzheimer disease ([place])   . Arthritis   . Dementia ([place]) 09/16/2016  . Gout    usually knees, occasionally feet  . Hearing aid worn   . 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 Prostate cancer [facility])    Past Surgical History:  Procedure Laterality Date  . COLONOSCOPY    . HEMORROIDECTOMY    . PROSTATE BIOPSY    . TONSILLECTOMY AND ADENOIDECTOMY     Social History:   reports that he quit smoking about 59 years ago. His smoking use included cigarettes. He has a 2.50 pack-year smoking history. He has quit using smokeless tobacco. He reports that he does not drink alcohol or use drugs.  Family History  Problem Relation Age of Onset  . Diverticulitis Father   . Cancer Paternal Uncle   . Colon cancer Neg Hx     Medications: Patient's Medications  New Prescriptions   No medications on file  Previous Medications   CAMPHOR-MENTHOL (SARNA) LOTION    Apply topically as needed for itching.   NEOMYCIN-BACITRACIN-POLYMYXIN (NEOSPORIN) OINT    Apply 1 application topically  2 (two) times daily as needed for wound care.   SERTRALINE (ZOLOFT) 100 MG TABLET    Take 0.5 tablets (50 mg total) by mouth 2 (two) times daily.   TRAZODONE (DESYREL) 50 MG TABLET    Take 1 tablet (50 mg total) by mouth at bedtime.  Modified Medications   No medications on file  Discontinued Medications   No medications on file     Physical Exam:  Vitals:   01/10/18 1144  BP: 118/82  Pulse: 84  Temp: 98.4 F (36.9 C)  TempSrc: Oral  SpO2: 96%  Weight: 184 lb (83.5 kg)  Height: 5\' 9"  (1.753 m)   Body mass index is 27.17 kg/m.  Physical Exam  Constitutional: He appears  well-developed and well-nourished. No distress.  HENT:  Head: Normocephalic and atraumatic.  Eyes: Pupils are equal, round, and reactive to light. Conjunctivae and EOM are normal.  Neck: Normal range of motion. Neck supple.  Cardiovascular: Normal rate, regular rhythm and normal heart sounds.  Pulmonary/Chest: Effort normal and breath sounds normal.  Abdominal: Soft. Bowel sounds are normal. He exhibits no distension. There is no tenderness.  Musculoskeletal: He exhibits no edema or tenderness.  Neurological: He is alert.  Skin: Skin is warm and dry. He is not diaphoretic.  Psychiatric: He has a normal mood and affect. Cognition and memory are impaired. He exhibits abnormal recent memory. He exhibits normal remote memory.    Labs reviewed: Basic Metabolic Panel: Recent Labs    11/07/17 1715 11/08/17 0341 11/09/17 0501  NA 142 144 144  K 3.9 3.6 3.4*  CL 109 113* 113*  CO2 21* 23 23  GLUCOSE 113* 89 92  BUN 25* 20 15  CREATININE 1.39* 1.17 1.04  CALCIUM 9.2 8.5* 8.4*   Liver Function Tests: Recent Labs    11/07/17 1715 11/08/17 0341 11/09/17 0501  AST 87* 111* 101*  ALT 31 28 34  ALKPHOS 71 55 53  BILITOT 1.5* 1.0 0.8  PROT 6.7 5.6* 5.5*  ALBUMIN 3.7 3.0* 3.0*   No results for input(s): LIPASE, AMYLASE in the last 8760 hours. No results for input(s): AMMONIA in the last 8760 hours. CBC: Recent Labs    08/04/17 1447 11/07/17 1715 11/08/17 0341 11/09/17 0501  WBC 5.5 5.8 5.2 4.4  NEUTROABS 3,916 4.9 3.4  --   HGB 14.0 13.7 11.8* 11.8*  HCT 40.3 43.8 35.5* 35.6*  MCV 88.8 97.6 93.4 92.5  PLT 171 146* 129* 121*   Lipid Panel: No results for input(s): CHOL, HDL, LDLCALC, TRIG, CHOLHDL, LDLDIRECT in the last 8760 hours. TSH: No results for input(s): TSH in the last 8760 hours. A1C: No results found for: HGBA1C   Assessment/Plan 1. Alzheimer's dementia with behavioral disturbance, unspecified timing of dementia onset ([place]) -progressive decline noted. Some  days better than others. Recently with acute agitation. Pt with hx of uti therefore son concerned over this. Unable to get urine today but son plans on obtaining at home.  - CBC with Differential/Platelets - COMPLETE METABOLIC PANEL WITH GFR - TSH  2. Anxiety Overall zoloft helpful. Sometimes with lethargy after taking morning dose, can take full dose at bedtime. Used seroquel PRN when he had episode of agitation which was beneficial, however causes sedation. Discussed using alprazolam PRN in the future if needed.     3.  Insomnia, unspecified type Occasionally will have more issue with this. Uses trazodone for sleep which has been beneficial.   4. Agitation -will follow up labs, UA to be  obtained.   Next appt: 01/10/2018 Carlos American. [place], [facility] [phone]

## 2018-01-11 LAB — TSH: TSH: 2.78 mIU/L (ref 0.40–4.50)

## 2018-01-11 LAB — CBC WITH DIFFERENTIAL/PLATELET
BASOS PCT: 0.8 %
Basophils Absolute: 41 cells/uL (ref 0–200)
Eosinophils Absolute: 199 cells/uL (ref 15–500)
Eosinophils Relative: 3.9 %
HEMATOCRIT: 46.9 % (ref 38.5–50.0)
Hemoglobin: 16.4 g/dL (ref 13.2–17.1)
LYMPHS ABS: 796 {cells}/uL — AB (ref 850–3900)
MCH: 31.2 pg (ref 27.0–33.0)
MCHC: 35 g/dL (ref 32.0–36.0)
MCV: 89.2 fL (ref 80.0–100.0)
MPV: 10.7 fL (ref 7.5–12.5)
Monocytes Relative: 7.5 %
NEUTROS ABS: 3682 {cells}/uL (ref 1500–7800)
NEUTROS PCT: 72.2 %
Platelets: 176 10*3/uL (ref 140–400)
RBC: 5.26 10*6/uL (ref 4.20–5.80)
RDW: 13.4 % (ref 11.0–15.0)
Total Lymphocyte: 15.6 %
WBC: 5.1 10*3/uL (ref 3.8–10.8)
WBCMIX: 383 {cells}/uL (ref 200–950)

## 2018-01-11 LAB — COMPLETE METABOLIC PANEL WITH GFR
AG RATIO: 1.4 (calc) (ref 1.0–2.5)
ALT: 15 U/L (ref 9–46)
AST: 21 U/L (ref 10–35)
Albumin: 4.7 g/dL (ref 3.6–5.1)
Alkaline phosphatase (APISO): 93 U/L (ref 40–115)
BUN: 20 mg/dL (ref 7–25)
CALCIUM: 9.8 mg/dL (ref 8.6–10.3)
CHLORIDE: 100 mmol/L (ref 98–110)
CO2: 29 mmol/L (ref 20–32)
CREATININE: 1.02 mg/dL (ref 0.70–1.18)
GFR, EST NON AFRICAN AMERICAN: 70 mL/min/{1.73_m2} (ref >=60)
GFR, Est African American: 81 mL/min/{1.73_m2} (ref >=60)
GLOBULIN: 3.4 g/dL (ref 1.9–3.7)
Glucose, Bld: 130 mg/dL — ABNORMAL HIGH (ref 65–99)
Potassium: 3.5 mmol/L (ref 3.5–5.3)
SODIUM: 139 mmol/L (ref 135–146)
Total Bilirubin: 0.6 mg/dL (ref 0.2–1.2)
Total Protein: 8.1 g/dL (ref 6.1–8.1)

## 2018-01-12 ENCOUNTER — Emergency Department (HOSPITAL_COMMUNITY): Payer: Medicare Other

## 2018-01-12 ENCOUNTER — Encounter (HOSPITAL_COMMUNITY): Payer: Self-pay | Admitting: Emergency Medicine

## 2018-01-12 ENCOUNTER — Other Ambulatory Visit: Payer: Self-pay

## 2018-01-12 ENCOUNTER — Telehealth: Payer: Self-pay | Admitting: *Deleted

## 2018-01-12 ENCOUNTER — Inpatient Hospital Stay (HOSPITAL_COMMUNITY)
Admission: EM | Admit: 2018-01-12 | Discharge: 2018-01-18 | DRG: 689 | Disposition: A | Discharge status: Skilled Nursing Facility | Payer: Medicare Other | Attending: Family Medicine | Admitting: Family Medicine

## 2018-01-12 DIAGNOSIS — Z881 Allergy status to other antibiotic agents status: Secondary | ICD-10-CM | POA: Diagnosis not present

## 2018-01-12 DIAGNOSIS — N181 Chronic kidney disease, stage 1: Secondary | ICD-10-CM | POA: Diagnosis present

## 2018-01-12 DIAGNOSIS — N390 Urinary tract infection, site not specified: Principal | ICD-10-CM

## 2018-01-12 DIAGNOSIS — G92 Toxic encephalopathy: Secondary | ICD-10-CM | POA: Diagnosis not present

## 2018-01-12 DIAGNOSIS — R319 Hematuria, unspecified: Secondary | ICD-10-CM | POA: Diagnosis present

## 2018-01-12 DIAGNOSIS — Z79899 Other long term (current) drug therapy: Secondary | ICD-10-CM | POA: Diagnosis not present

## 2018-01-12 DIAGNOSIS — Z888 Allergy status to other drugs, medicaments and biological substances status: Secondary | ICD-10-CM | POA: Diagnosis not present

## 2018-01-12 DIAGNOSIS — F05 Delirium due to known physiological condition: Secondary | ICD-10-CM | POA: Diagnosis present

## 2018-01-12 DIAGNOSIS — R339 Retention of urine, unspecified: Secondary | ICD-10-CM | POA: Diagnosis present

## 2018-01-12 DIAGNOSIS — Z885 Allergy status to narcotic agent status: Secondary | ICD-10-CM | POA: Diagnosis not present

## 2018-01-12 DIAGNOSIS — R4689 Other symptoms and signs involving appearance and behavior: Secondary | ICD-10-CM

## 2018-01-12 DIAGNOSIS — R4182 Altered mental status, unspecified: Secondary | ICD-10-CM | POA: Diagnosis not present

## 2018-01-12 DIAGNOSIS — F419 Anxiety disorder, unspecified: Secondary | ICD-10-CM

## 2018-01-12 DIAGNOSIS — G309 Alzheimer's disease, unspecified: Secondary | ICD-10-CM | POA: Diagnosis present

## 2018-01-12 DIAGNOSIS — F0281 Dementia in other diseases classified elsewhere with behavioral disturbance: Secondary | ICD-10-CM | POA: Diagnosis present

## 2018-01-12 DIAGNOSIS — B961 Klebsiella pneumoniae [K. pneumoniae] as the cause of diseases classified elsewhere: Secondary | ICD-10-CM | POA: Diagnosis present

## 2018-01-12 DIAGNOSIS — Z66 Do not resuscitate: Secondary | ICD-10-CM | POA: Diagnosis present

## 2018-01-12 DIAGNOSIS — M109 Gout, unspecified: Secondary | ICD-10-CM | POA: Diagnosis present

## 2018-01-12 DIAGNOSIS — J849 Interstitial pulmonary disease, unspecified: Secondary | ICD-10-CM | POA: Diagnosis present

## 2018-01-12 DIAGNOSIS — Z87440 Personal history of urinary (tract) infections: Secondary | ICD-10-CM

## 2018-01-12 DIAGNOSIS — G4700 Insomnia, unspecified: Secondary | ICD-10-CM

## 2018-01-12 DIAGNOSIS — M170 Bilateral primary osteoarthritis of knee: Secondary | ICD-10-CM | POA: Diagnosis present

## 2018-01-12 DIAGNOSIS — Z87891 Personal history of nicotine dependence: Secondary | ICD-10-CM | POA: Diagnosis not present

## 2018-01-12 DIAGNOSIS — Z8546 Personal history of malignant neoplasm of prostate: Secondary | ICD-10-CM | POA: Diagnosis not present

## 2018-01-12 DIAGNOSIS — E876 Hypokalemia: Secondary | ICD-10-CM | POA: Diagnosis present

## 2018-01-12 DIAGNOSIS — F0390 Unspecified dementia without behavioral disturbance: Secondary | ICD-10-CM | POA: Diagnosis present

## 2018-01-12 DIAGNOSIS — Z974 Presence of external hearing-aid: Secondary | ICD-10-CM | POA: Diagnosis not present

## 2018-01-12 HISTORY — DX: Pneumonia, unspecified organism: J18.9

## 2018-01-12 LAB — URINALYSIS, ROUTINE W REFLEX MICROSCOPIC
Bilirubin Urine: NEGATIVE
GLUCOSE, UA: NEGATIVE mg/dL
Ketones, ur: 80 mg/dL — AB
NITRITE: POSITIVE — AB
PH: 5 (ref 5.0–8.0)
PROTEIN: NEGATIVE mg/dL
Specific Gravity, Urine: 1.017 (ref 1.005–1.030)

## 2018-01-12 LAB — COMPREHENSIVE METABOLIC PANEL
ALK PHOS: 70 U/L (ref 38–126)
ALT: 17 U/L (ref 0–44)
ANION GAP: 10 (ref 5–15)
AST: 26 U/L (ref 15–41)
Albumin: 4.3 g/dL (ref 3.5–5.0)
BILIRUBIN TOTAL: 0.9 mg/dL (ref 0.3–1.2)
BUN: 17 mg/dL (ref 8–23)
CO2: 25 mmol/L (ref 22–32)
Calcium: 9.9 mg/dL (ref 8.9–10.3)
Chloride: 102 mmol/L (ref 98–111)
Creatinine, Ser: 1.04 mg/dL (ref 0.61–1.24)
Glucose, Bld: 106 mg/dL — ABNORMAL HIGH (ref 70–99)
POTASSIUM: 4 mmol/L (ref 3.5–5.1)
Sodium: 137 mmol/L (ref 135–145)
TOTAL PROTEIN: 8.3 g/dL — AB (ref 6.5–8.1)

## 2018-01-12 LAB — CBC WITH DIFFERENTIAL/PLATELET
Abs Immature Granulocytes: 0.01 10*3/uL (ref 0.00–0.07)
BASOS ABS: 0 10*3/uL (ref 0.0–0.1)
BASOS PCT: 0 %
Eosinophils Absolute: 0 10*3/uL (ref 0.0–0.5)
Eosinophils Relative: 0 %
HEMATOCRIT: 47.4 % (ref 39.0–52.0)
Hemoglobin: 15.7 g/dL (ref 13.0–17.0)
IMMATURE GRANULOCYTES: 0 %
Lymphocytes Relative: 8 %
Lymphs Abs: 0.5 10*3/uL — ABNORMAL LOW (ref 0.7–4.0)
MCH: 30.3 pg (ref 26.0–34.0)
MCHC: 33.1 g/dL (ref 30.0–36.0)
MCV: 91.5 fL (ref 80.0–100.0)
MONOS PCT: 5 %
Monocytes Absolute: 0.3 10*3/uL (ref 0.1–1.0)
NEUTROS ABS: 5 10*3/uL (ref 1.7–7.7)
NRBC: 0 % (ref 0.0–0.2)
Neutrophils Relative %: 87 %
PLATELETS: 150 10*3/uL (ref 150–400)
RBC: 5.18 MIL/uL (ref 4.22–5.81)
RDW: 14 % (ref 11.5–15.5)
WBC: 5.8 10*3/uL (ref 4.0–10.5)

## 2018-01-12 LAB — CK: CK TOTAL: 242 U/L (ref 49–397)

## 2018-01-12 LAB — I-STAT CG4 LACTIC ACID, ED: Lactic Acid, Venous: 1.06 mmol/L (ref 0.5–1.9)

## 2018-01-12 MED ORDER — TRAZODONE HCL 50 MG PO TABS
50.0000 mg | ORAL_TABLET | Freq: Every day | ORAL | Status: DC
  Administered 2018-01-13 – 2018-01-17 (×5): 50 mg via ORAL
  Filled 2018-01-12 (×6): qty 1

## 2018-01-12 MED ORDER — SODIUM CHLORIDE 0.9 % IV SOLN
1.0000 g | Freq: Once | INTRAVENOUS | Status: AC
  Administered 2018-01-12: 1 g via INTRAVENOUS
  Filled 2018-01-12: qty 10

## 2018-01-12 MED ORDER — QUETIAPINE FUMARATE 100 MG PO TABS
100.0000 mg | ORAL_TABLET | Freq: Every evening | ORAL | Status: DC | PRN
  Administered 2018-01-13 – 2018-01-16 (×4): 100 mg via ORAL
  Filled 2018-01-12: qty 2
  Filled 2018-01-12: qty 1
  Filled 2018-01-12: qty 2
  Filled 2018-01-12 (×4): qty 1
  Filled 2018-01-12 (×2): qty 2
  Filled 2018-01-12: qty 1

## 2018-01-12 MED ORDER — DEXTROSE-NACL 5-0.9 % IV SOLN
INTRAVENOUS | Status: DC
  Administered 2018-01-12: 22:00:00 via INTRAVENOUS
  Administered 2018-01-13: 100 mL/h via INTRAVENOUS
  Administered 2018-01-13: 22:00:00 via INTRAVENOUS

## 2018-01-12 MED ORDER — ENOXAPARIN SODIUM 40 MG/0.4ML ~~LOC~~ SOLN
40.0000 mg | SUBCUTANEOUS | Status: DC
  Administered 2018-01-13 – 2018-01-18 (×6): 40 mg via SUBCUTANEOUS
  Filled 2018-01-12 (×6): qty 0.4

## 2018-01-12 MED ORDER — SERTRALINE HCL 50 MG PO TABS
50.0000 mg | ORAL_TABLET | Freq: Two times a day (BID) | ORAL | Status: DC
  Administered 2018-01-13 – 2018-01-18 (×10): 50 mg via ORAL
  Filled 2018-01-12 (×12): qty 1

## 2018-01-12 MED ORDER — SODIUM CHLORIDE 0.9 % IV SOLN
1.0000 g | INTRAVENOUS | Status: DC
  Administered 2018-01-13: 1 g via INTRAVENOUS
  Filled 2018-01-12 (×2): qty 10

## 2018-01-12 NOTE — H&P (Signed)
History and Physical    Emidio Warrell KJZ:[id number] DOB: 03-21-1940 DOA: 01/12/2018  PCP: Lauree Chandler, NP Consultants:  none Patient coming from: home- lives alone  Chief Complaint: AMS  HPI: [facility] is a 77 y.o. male with medical history significant for dementia, prostate cancer, chronic urinary retention who presents with altered mental status.  The patient lives home alone but has caregivers that come in for assistance.  Today home health nurse noted that the patient had rolled off of his mattress which sits directly on the floor.  She reported that he has had darker urine for the past 3 days as well as decreased appetite with no oral intake today.  He has been altered from his baseline.  He is normally able to converse.  He was seen by his primary care doctor on November 18 for confusion and agitation and excessive sleeping.  The plan had been to get a urine sample because his behavior was consistent with prior UTIs but they were unable to get a urine sample.  Son planned on obtaining urine at home but this was not done for unclear reasons.  Conversation with pt's brother Abbe Amsterdam and caregiver Caren Griffins who is with him 7 days a week: Since caregiver has been with pt for the past 10 weeks, pt has been intermittently violent and aggressive, escalating over the last 2 weeks. Per Caren Griffins, this behavior had been going on for far longer than that. Pt has lost 25 lbs over 10 weeks. He typically eats meals with assistance; for the last week pt has been choking and gagging at times so they have stopped feeding him since yesterday. Pt is usually ambulatory with a shuffle, needing assistance; over the last 2 weeks he has been nonambulatory, less interactive, not responsive at times, occasionally incontinent of urine and stool. Pt often gets violent when caregivers are trying to change him. Phil and Caren Griffins feel that patient needs to be placed in a facility. Son who is POA adamantly disagrees and wants  his dad to stay at home. Pt has 2 older daughters who are not involved.  Abbe Amsterdam and Caren Griffins asked that the aforementioned conversation be kept confidential from patient's son, [name].   ED Course: Temperature 98 blood pressure 120/73 pulse 68 respirations 15; 98% on room air; 83.5 kg.  CT of the head without contrast showed nothing acute, age-related cerebral atrophy with chronic small vessel ischemic disease which was stable.  Urinalysis was consistent with UTI.   Review of Systems: As per HPI; otherwise review of systems reviewed and negative.   Ambulatory Status: Nonambulatory   Past Medical History:  Diagnosis Date  . Alzheimer disease ([place])   . Arthritis   . Dementia ([place]) 09/16/2016  . Gout    usually knees, occasionally feet  . Hearing aid worn   . 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 Prostate cancer [facility])     Past Surgical History:  Procedure Laterality Date  . COLONOSCOPY    . HEMORROIDECTOMY    . PROSTATE BIOPSY    . TONSILLECTOMY AND ADENOIDECTOMY      Social History   Socioeconomic History  . Marital status: Widowed    Spouse name: Not on file  . Number of children: 3  . Years of education: Master's  . Highest education level: Not on file  Occupational History  . Occupation: retired  Scientific laboratory technician  . Financial resource strain: Not hard at all  . Food insecurity:    Worry: Never true  Inability: Never true  . Transportation needs:    Medical: No    Non-medical: No  Tobacco Use  . Smoking status: Former Smoker    Packs/day: 0.25    Years: 10.00    Pack years: 2.50    Types: Cigarettes    Last attempt to quit: 02/23/1958    Years since quitting: 59.9  . Smokeless tobacco: Former Systems developer  . Tobacco comment: quit 1963  Substance and Sexual Activity  . Alcohol use: No    Alcohol/week: 6.0 standard drinks    Types: 6 Cans of beer per week    Comment: h/o occasional use  . Drug use: No  . Sexual activity: Not Currently  Lifestyle  . Physical  activity:    Days per week: 7 days    Minutes per session: 20 min  . Stress: Not at all  Relationships  . Social connections:    Talks on phone: More than three times a week    Gets together: More than three times a week    Attends religious service: More than 4 times per year    Active member of club or organization: No    Attends meetings of clubs or organizations: Never    Relationship status: Widowed  . Intimate partner violence:    Fear of current or ex partner: No    Emotionally abused: No    Physically abused: No    Forced sexual activity: No  Other Topics Concern  . Not on file  Social History Narrative   Social History      Diet? regular      Do you drink/eat things with caffeine? yes      Marital status?       widow                             What year were you married?      Do you live in a house, apartment, assisted living, condo, trailer, etc.? home      Is it one or more stories? 1 floor      How many persons live in your home? Round clock care      Do you have any pets in your home? (please list) no      Highest level of education completed? Masters in [employer]      Current or past profession:      Do you exercise?        some                              Type & how often? walk      Advanced Directives      Do you have a living will? yes      Do you have a DNR form?                                  If not, do you want to discuss one? yes      Do you have signed POA/HPOA for forms? yes      Functional Status      Do you have difficulty bathing or dressing yourself? yes      Do you have difficulty preparing food or eating? yes      Do you have difficulty managing your medications? yes  Do you have difficulty managing your finances? yes      Do you have difficulty affording your medications? no    Allergies  Allergen Reactions  . Hydrocodone Other (See Comments)    Reaction:  Hallucinations  . Indocin [Indomethacin] Other (See  Comments)    Reaction:  Confusion   . Amoxicillin-Pot Clavulanate Other (See Comments)    Reaction:  Nightmares Has patient had a PCN reaction causing immediate rash, facial/tongue/throat swelling, SOB or lightheadedness with hypotension: No Has patient had a PCN reaction causing severe rash involving mucus membranes or skin necrosis: No Has patient had a PCN reaction that required hospitalization: No Has patient had a PCN reaction occurring within the last 10 years: Yes If all of the above answers are "NO", then may proceed with Cephalosporin use.  . Erythromycin Ethylsuccinate Other (See Comments)    Reaction:  Eye irritation     Family History  Problem Relation Age of Onset  . Diverticulitis Father   . Cancer Paternal Uncle   . Colon cancer Neg Hx     Prior to Admission medications   Medication Sig Start Date End Date Taking? Authorizing Provider  camphor-menthol [employer]) lotion Apply topically as needed for itching. 11/09/17   Geradine Girt, DO  neomycin-bacitracin-polymyxin (NEOSPORIN) OINT Apply 1 application topically 2 (two) times daily as needed for wound care.    [provider]  sertraline (ZOLOFT) 100 MG tablet Take 0.5 tablets (50 mg total) by mouth 2 (two) times daily. 11/09/17   Geradine Girt, DO  traZODone (DESYREL) 50 MG tablet Take 1 tablet (50 mg total) by mouth at bedtime. 08/10/17   Lauree Chandler, NP    Physical Exam: Vitals:   01/12/18 1332 01/12/18 1430 01/12/18 1500 01/12/18 1530  BP: 123/73 118/65 109/73 117/84  Pulse: 70 69 68 69  Resp: 20 (!) 22 15 16   Temp:      SpO2: 98% 98% 95% 95%  Weight:      Height:         . General: Elderly male, nonverbal, unresponsive to voice. Withdraws to painful stimuli in all extremities . Eyes:  PERRL, EOMI, normal lids, iris . ENT:  mmm . Neck:  supple, no lymphadenopathy . Cardiovascular:  nL S1, S2, normal rate, reg rhythm, no murmur. Marland Kitchen Respiratory:   CTA bilaterally with no  wheezes/rales/rhonchi.  Normal respiratory effort. . Abdomen:  soft, NT, ND, NABS . Back:   grossly normal alignment . Skin:  no rash or lesions seen on limited exam . Musculoskeletal:  grossly normal tone BUE/BLE, good ROM, no bony abnormality or obvious joint deformity . Lower extremities:  No LE edema.  Limited foot exam with no ulcerations.  2+ distal pulses. Marland Kitchen Psychiatric:  grossly normal mood and affect, speech fluent and appropriate, AOx3 . Neurologic: DTRs 2+, symmetric. Appears to move all extremities. Nonfocal.    Radiological Exams on Admission: Dg Chest 2 View  Result Date: 01/12/2018 CLINICAL DATA:  Altered mental status. EXAM: CHEST - 2 VIEW COMPARISON:  11/09/2016 radiographs FINDINGS: The cardiomediastinal silhouette is unchanged with prominent thoracic aorta. Chronic interstitial opacities/fibrosis again noted. There is no evidence of focal airspace disease, pulmonary edema, suspicious pulmonary nodule/mass, pleural effusion, or pneumothorax. No acute bony abnormalities are identified. IMPRESSION: 1. No evidence of acute abnormality 2. Unchanged prominent thoracic aorta and interstitial lung disease/fibrosis. Electronically Signed   By: Margarette Canada M.D.   On: 01/12/2018 14:19   Ct Head Wo Contrast  Result Date:  01/12/2018 CLINICAL DATA:  Initial evaluation for acute altered mental status. EXAM: CT HEAD WITHOUT CONTRAST TECHNIQUE: Contiguous axial images were obtained from the base of the skull through the vertex without intravenous contrast. COMPARISON:  Prior CT from 11/07/2017. FINDINGS: Brain: Age-related cerebral atrophy with chronic small vessel ischemic disease. No acute intracranial hemorrhage. No acute large vessel territory infarct. No mass lesion, midline shift or mass effect. No hydrocephalus. No extra-axial fluid collection. Vascular: No hyperdense vessel. Scattered vascular calcifications noted within the carotid siphons. Skull: Scalp soft tissues and calvarium  within normal limits. Sinuses/Orbits: Globes and orbital soft tissues within normal limits. Mild scattered mucosal thickening within the ethmoidal air cells. Paranasal sinuses otherwise clear. No mastoid effusion. Other: None. IMPRESSION: 1. No acute intracranial abnormality. 2. Age-related cerebral atrophy with chronic small vessel ischemic disease, stable. Electronically Signed   By: Jeannine Boga M.D.   On: 01/12/2018 14:11    EKG: Independently reviewed.  Date/Time:                  Wednesday January 12 2018 13:16:34 EST Ventricular Rate:         66 PR Interval:                   QRS Duration: 97 QT Interval:                 416 QTC Calculation:        436 R Axis:                         24 Text Interpretation:       Sinus rhythm Baseline wander in lead(s) II V5 No significant change since last tracing   Labs on Admission: I have personally reviewed the available labs and imaging studies at the time of the admission.  Pertinent labs:  Sodium 137 potassium 4.0 chloride 102 CO2 25 glucose 106 BUN 17 creatinine 1.04, baseline; calcium 9.9, LFTs within normal limits. CK 242 Lactic acid 1.06 WBC 5.8 hemoglobin 15.7 platelets 150 Is: Large leukocytes, positive nitrite, greater than 50 WBCs, 11-20 RBCs   Assessment/Plan Principal Problem:   AMS (altered mental status) Active Problems:   Dementia (HCC)   Acute lower UTI   Aggressive behavior   AMS: Hypoactive delirium superimposed on baseline Alzheimer's dementia, likely due to acute UTI -Continue Rocephin 1 g to 24 hours -Follow-up urine culture, tailor antibiotics as appropriate -Daily CBC -Patient has been exhibiting aggressive and at times violent behavior towards his caregivers.  Patient's brother, Abbe Amsterdam and his primary caregiver, Caren Griffins strongly feel that patient needs to be placed in a facility for his protection as well as theirs and I agree with this.  Apparently the son is adamant that patient stay home and not be  placed in a facility.  Will ask social work to get involved. -Continue Seroquel nightly as needed agitation/aggression -Continue home doses of trazodone, Zoloft -Patient will likely require restraints to avoid him pulling at his lines and tubes -Patient currently unable to eat due to mental status.  Continue to follow and if patient becomes more alert, would get swallow eval and order diet as appropriate.  In the meantime we will give D5NS at 100 cc/h  Lower UTI: -Antibiotics as above, follow-up cultures and tailor antibiotics as appropriate   DVT prophylaxis: lovenox Code Status: DNR Full - confirmed with brother Family Communication: brother Phil at bedside  Disposition Plan: TBD; I feel that pt needs  placement at facility due to his dementia, inability to care for himself, and aggressive/injurious behavior Consults called: Social work  Admission status: Admit - It is my clinical opinion that admission to INPATIENT is reasonable and necessary because of the expectation that this patient will require hospital care that crosses at least 2 midnights to treat this condition based on the medical complexity of the problems presented.  Given the aforementioned information, the predictability of an adverse outcome is felt to be significant.    Janora Norlander MD [facility]  If note is complete, please contact covering daytime or nighttime physician. [email or web address] Password [email or web address]  01/12/2018, 3:35 PM

## 2018-01-12 NOTE — Telephone Encounter (Signed)
Son notified and he will contact [facility].

## 2018-01-12 NOTE — Progress Notes (Signed)
Patient arrived to the floor disoriented X4. No pain.  Caretaker at bedside.  Abrasions on left knee and right lateral knee.  Placed on tele. All questions and concerns addressed.

## 2018-01-12 NOTE — ED Notes (Signed)
Pt. Pulled his IV out. Car giver and brother with pt.  Called IV team to reinsert IV.

## 2018-01-12 NOTE — ED Provider Notes (Signed)
[facility] Provider Note   CSN: [id number] Arrival date & time: 01/12/18  1305     History   Chief Complaint Chief Complaint  Patient presents with  . Altered Mental Status    HPI [facility] is a 77 y.o. male.  -year-old male with past medical history including dementia, prostate cancer, chronic urinary retention who presents with altered mental status.  The patient lives home alone but has caregivers that come in for assistance.  Today home health nurse noted that the patient had rolled off of his mattress which sits directly on the floor.  She reported that he has had darker urine for the past 3 days as well as decreased appetite with no oral intake today.  He has been altered from his baseline.  He is normally able to converse.  Chart review shows that son took him to PCP 2 days ago for concerns for agitation and altered mentation from baseline.  Chart review shows that he has had these symptoms previously associated with UTI. EMS was called to bring patient here.  LEVEL 5 CAVEAT DUE TO AMS  The history is provided by the EMS personnel. The history is limited by the condition of the patient.  Altered Mental Status      Past Medical History:  Diagnosis Date  . Alzheimer disease ([place])   . Arthritis   . Dementia ([place]) 09/16/2016  . Gout    usually knees, occasionally feet  . Hearing aid worn   . 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 Prostate cancer [facility])     Patient Active Problem List   Diagnosis Date Noted  . Rhabdomyolysis 11/07/2017  . Normocytic anemia 10/28/2016  . Hyperglycemia 10/25/2016  . Thrombocytopenia ([place]) 10/25/2016  . Hematuria 10/24/2016  . Urinary retention 10/08/2016  . Dementia ([place]) 09/16/2016  . Malignant neoplasm of prostate ([place]) 04/23/2016  . Erectile dysfunction 07/23/2015  . Elevated PSA, less than 10 ng/ml 06/27/2012  . GOUT 12/26/2008  . 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 History of colonic polyps  10/28/2006  . 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 COLONOSCOPY    . HEMORROIDECTOMY    . PROSTATE BIOPSY    . TONSILLECTOMY AND ADENOIDECTOMY          Home Medications    Prior to Admission medications   Medication Sig Start Date End Date Taking? Authorizing Provider  camphor-menthol [facility]) lotion Apply topically as needed for itching. 11/09/17   Geradine Girt, DO  neomycin-bacitracin-polymyxin (NEOSPORIN) OINT Apply 1 application topically 2 (two) times daily as needed for wound care.    [provider]  sertraline (ZOLOFT) 100 MG tablet Take 0.5 tablets (50 mg total) by mouth 2 (two) times daily. 11/09/17   Geradine Girt, DO  traZODone (DESYREL) 50 MG tablet Take 1 tablet (50 mg total) by mouth at bedtime. 08/10/17   Lauree Chandler, NP    Family History Family History  Problem Relation Age of Onset  . Diverticulitis Father   . Cancer Paternal Uncle   . Colon cancer Neg Hx     Social History Social History   Tobacco Use  . Smoking status: Former Smoker    Packs/day: 0.25    Years: 10.00    Pack years: 2.50    Types: Cigarettes    Last attempt to quit: 02/23/1958    Years since quitting: 59.9  . Smokeless tobacco: Former Systems developer  .  Tobacco comment: quit 1963  Substance Use Topics  . Alcohol use: No    Alcohol/week: 6.0 standard drinks    Types: 6 Cans of beer per week    Comment: h/o occasional use  . Drug use: No     Allergies   Hydrocodone; Indocin [indomethacin]; Amoxicillin-pot clavulanate; and Erythromycin ethylsuccinate   Review of Systems Review of Systems  Unable to perform ROS: Dementia    Physical Exam Updated Vital Signs BP 117/84   Pulse 69   Temp 98 F (36.7 C)   Resp 16   Ht 5\' 9"  (1.753 m)   Wt 83.5 kg   SpO2 95%   BMI 27.17 kg/m   Physical Exam  Constitutional: He appears well-developed and well-nourished. No distress.  HENT:  Head: Normocephalic and  atraumatic.  dry mucous membranes  Eyes: Pupils are equal, round, and reactive to light. Conjunctivae are normal.  Neck: Neck supple.  Cardiovascular: Normal rate, regular rhythm and normal heart sounds.  No murmur heard. Pulmonary/Chest: Effort normal and breath sounds normal.  Abdominal: Soft. Bowel sounds are normal. He exhibits no distension. There is no tenderness.  Musculoskeletal: He exhibits no edema.  Neurological: He is alert.  Non-verbal, able to follow a few commands, confused; no facial asymmetry  Skin: Skin is warm and dry.  Nursing note and vitals reviewed.    ED Treatments / Results  Labs (all labs ordered are listed, but only abnormal results are displayed) Labs Reviewed  COMPREHENSIVE METABOLIC PANEL - Abnormal; Notable for the following components:      Result Value   Glucose, Bld 106 (*)    Total Protein 8.3 (*)    All other components within normal limits  CBC WITH DIFFERENTIAL/PLATELET - Abnormal; Notable for the following components:   Lymphs Abs 0.5 (*)    All other components within normal limits  URINALYSIS, ROUTINE W REFLEX MICROSCOPIC - Abnormal; Notable for the following components:   Color, Urine AMBER (*)    APPearance CLOUDY (*)    Hgb urine dipstick SMALL (*)    Ketones, ur 80 (*)    Nitrite POSITIVE (*)    Leukocytes, UA LARGE (*)    WBC, UA >50 (*)    Bacteria, UA MANY (*)    All other components within normal limits  URINE CULTURE  CK  I-STAT CG4 LACTIC ACID, ED  I-STAT CG4 LACTIC ACID, ED    EKG EKG Interpretation  Date/Time:  Wednesday January 12 2018 13:16:34 EST Ventricular Rate:  66 PR Interval:    QRS Duration: 97 QT Interval:  416 QTC Calculation: 436 R Axis:   24 Text Interpretation:  Sinus rhythm Baseline wander in lead(s) II V5 No significant change since last tracing Confirmed by Theotis Burrow [phone]) on 01/12/2018 1:27:47 PM   Radiology Dg Chest 2 View  Result Date: 01/12/2018 CLINICAL DATA:  Altered mental  status. EXAM: CHEST - 2 VIEW COMPARISON:  11/09/2016 radiographs FINDINGS: The cardiomediastinal silhouette is unchanged with prominent thoracic aorta. Chronic interstitial opacities/fibrosis again noted. There is no evidence of focal airspace disease, pulmonary edema, suspicious pulmonary nodule/mass, pleural effusion, or pneumothorax. No acute bony abnormalities are identified. IMPRESSION: 1. No evidence of acute abnormality 2. Unchanged prominent thoracic aorta and interstitial lung disease/fibrosis. Electronically Signed   By: Margarette Canada M.D.   On: 01/12/2018 14:19   Ct Head Wo Contrast  Result Date: 01/12/2018 CLINICAL DATA:  Initial evaluation for acute altered mental status. EXAM: CT HEAD WITHOUT CONTRAST TECHNIQUE: Contiguous axial  images were obtained from the base of the skull through the vertex without intravenous contrast. COMPARISON:  Prior CT from 11/07/2017. FINDINGS: Brain: Age-related cerebral atrophy with chronic small vessel ischemic disease. No acute intracranial hemorrhage. No acute large vessel territory infarct. No mass lesion, midline shift or mass effect. No hydrocephalus. No extra-axial fluid collection. Vascular: No hyperdense vessel. Scattered vascular calcifications noted within the carotid siphons. Skull: Scalp soft tissues and calvarium within normal limits. Sinuses/Orbits: Globes and orbital soft tissues within normal limits. Mild scattered mucosal thickening within the ethmoidal air cells. Paranasal sinuses otherwise clear. No mastoid effusion. Other: None. IMPRESSION: 1. No acute intracranial abnormality. 2. Age-related cerebral atrophy with chronic small vessel ischemic disease, stable. Electronically Signed   By: Jeannine Boga M.D.   On: 01/12/2018 14:11    Procedures Procedures (including critical care time)  Medications Ordered in ED Medications  cefTRIAXone (ROCEPHIN) 1 g in sodium chloride 0.9 % 100 mL IVPB (has no administration in time range)      Initial Impression / Assessment and Plan / ED Course  I have reviewed the triage vital signs and the nursing notes.  Pertinent labs & imaging results that were available during my care of the patient were reviewed by me and considered in my medical decision making (see chart for details).    Awake, alerted but non-toxic with normal VS. Obtained labs and CXR, head CT.  Lab work shows normal lactate, reassuring CMP, UA consistent with infection.  Urine culture sent.  Normal CBC and normal CK.  Test x-ray and head CT stable from previous.  Reviewed culture data and gave the patient ceftriaxone.  Based on chart review and notes between the patient's provider and son, I suspect that his UTI has driven his worsening altered mental status and behavioral changes.  Recommended admission to ensure improvement.  Discussed with [facility] hospitalist, Dr. Steffanie Dunn, and patient admitted for further care.  Final Clinical Impressions(s) / ED Diagnoses   Final diagnoses:  Altered mental status, unspecified altered mental status type  Urinary tract infection with hematuria, site unspecified    ED Discharge Orders    None       , Wenda Overland, MD 01/12/18 1549

## 2018-01-12 NOTE — Progress Notes (Signed)
Message sent to [facility] MD  "3W 32 pt not able to stay alert to swallow or follow directions with help of Caregiver and son Remo Lipps" in room .

## 2018-01-12 NOTE — Progress Notes (Signed)
Consult placed to restart iv, as pt pulled out an iv from his right hand;  Family members with the pt;  Pt has a 20ga in left ac with great blood return; flushed easily;  Pt only on Rocephin;  Will keep current site;  Wrapped it  loosely with gauze;  Family requesting someone stay with the pt as "he will pull this iv out too."   Suggest mittens for pt;   Another iv start held at this time.   Raynelle Fanning RN IV Team

## 2018-01-12 NOTE — Progress Notes (Signed)
[facility] is caregiver and would like for Case Mgr and SW to talk to her as well as son [facility].  Son [facility] has verbally given permission to have Caregiver included.  Concern is placement : home with home care vs. SNF, DME.

## 2018-01-12 NOTE — Telephone Encounter (Signed)
[facility], son called and stated that he needs a Rx for a Hospital bed faxed to [facility]. Stated that they need a hospital bed for home. Please Advise.

## 2018-01-12 NOTE — Telephone Encounter (Signed)
There are certain criteria that has to be met for hospital bed. I am not sure he qualifies at this time. If [facility] thinks he will they can contact us with that information.

## 2018-01-12 NOTE — ED Notes (Signed)
Family at bedside. Caregiver and younger brother with pt.

## 2018-01-12 NOTE — ED Triage Notes (Signed)
Per PTAR pt coming from home where he lives alone, has home health check on him during the day. States patient has had darker urine x 3 days and lack of appetite. Also reports patient "rolled out of bed." states mattress is directly on floor. Patient able to follow small commands.

## 2018-01-13 LAB — BASIC METABOLIC PANEL
Anion gap: 8 (ref 5–15)
BUN: 18 mg/dL (ref 8–23)
CO2: 24 mmol/L (ref 22–32)
Calcium: 8.9 mg/dL (ref 8.9–10.3)
Chloride: 107 mmol/L (ref 98–111)
Creatinine, Ser: 1.01 mg/dL (ref 0.61–1.24)
GFR calc Af Amer: >60 mL/min (ref >60)
GFR calc non Af Amer: >60 mL/min (ref >60)
Glucose, Bld: 107 mg/dL — ABNORMAL HIGH (ref 70–99)
Potassium: 3.4 mmol/L — ABNORMAL LOW (ref 3.5–5.1)
Sodium: 139 mmol/L (ref 135–145)

## 2018-01-13 LAB — CBC
HCT: 40.7 % (ref 39.0–52.0)
Hemoglobin: 13.1 g/dL (ref 13.0–17.0)
MCH: 29.2 pg (ref 26.0–34.0)
MCHC: 32.2 g/dL (ref 30.0–36.0)
MCV: 90.8 fL (ref 80.0–100.0)
Platelets: 160 10*3/uL (ref 150–400)
RBC: 4.48 MIL/uL (ref 4.22–5.81)
RDW: 14.2 % (ref 11.5–15.5)
WBC: 4.1 10*3/uL (ref 4.0–10.5)
nRBC: 0 % (ref 0.0–0.2)

## 2018-01-13 MED ORDER — ENSURE ENLIVE PO LIQD
237.0000 mL | Freq: Three times a day (TID) | ORAL | Status: DC
  Administered 2018-01-13 – 2018-01-18 (×13): 237 mL via ORAL

## 2018-01-13 NOTE — Evaluation (Signed)
Physical Therapy Evaluation Patient Details Name: Dina Mobley MRN: [id number] DOB: 1940/05/25 Today's Date: 01/13/2018   History of Present Illness  Pt is a 77 y/o male admitted secondary to AMS. Per pt's caregiver, pt has become more agressive and has increased difficulty with ambulation. PMH includes prostate cancer and dementia.   Clinical Impression  Pt admitted secondary to problem above with deficits below. Pt impulsive and required safety cues. RN present in room and assisted with performing multiple sit<>stand using stedy to perform toileting and bathing tasks. Required min A +2 throughout. Per pt's caregiver, pt has become increasingly aggressive and has been unable to ambulate without assistance. Feel pt will require SNF level therapies to ensure safety and independence with mobility. Will continue to follow acutely to maximize functional mobility independence and safety.     Follow Up Recommendations SNF;Supervision/Assistance - 24 hour    Equipment Recommendations  None recommended by PT    Recommendations for Other Services       Precautions / Restrictions Precautions Precautions: Fall;Other (comment) Precaution Comments: Pt has been aggressive at times. Was pleasant during PT session.  Restrictions Weight Bearing Restrictions: No      Mobility  Bed Mobility               General bed mobility comments: In bathroom with nursing staff upon entry.   Transfers Overall transfer level: Needs assistance   Transfers: Sit to/from Stand Sit to Stand: Min assist;+2 safety/equipment         General transfer comment: Min A For steadying assist to sit<>Stand using stedy. Pt very impulsive at times and required cues to remain seated when moving stedy. RN in room to assist with transfers and bathing tasks during session. Performed multiple sit<>Stands throughout session using stedy.   Ambulation/Gait             General Gait Details: Did not attempt   Stairs             Wheelchair Mobility    Modified Rankin (Stroke Patients Only)       Balance Overall balance assessment: Needs assistance Sitting-balance support: No upper extremity supported;Feet supported Sitting balance-Leahy Scale: Fair     Standing balance support: Bilateral upper extremity supported;During functional activity Standing balance-Leahy Scale: Poor Standing balance comment: Heavy reliance on UE support                              Pertinent Vitals/Pain Pain Assessment: Faces Faces Pain Scale: No hurt    Home Living Family/patient expects to be discharged to:: Skilled nursing facility                 Additional Comments: Per pt's caregiver, pt has been increasingly agressive and is unsafe to return home. Per caregiver, did not want that information shared with his son [name].     Prior Function Level of Independence: Needs assistance   Gait / Transfers Assistance Needed: Had caregiver 24/7 for safety. Per pt's son, could walk, but was having increased difficulty and needed more assist.            Hand Dominance        Extremity/Trunk Assessment   Upper Extremity Assessment Upper Extremity Assessment: Defer to OT evaluation    Lower Extremity Assessment Lower Extremity Assessment: Generalized weakness    Cervical / Trunk Assessment Cervical / Trunk Assessment: Normal  Communication   Communication: HOH(has hearing aides )  Cognition  Arousal/Alertness: Awake/alert Behavior During Therapy: Impulsive Overall Cognitive Status: History of cognitive impairments - at baseline                                 General Comments: Was pleasant throughout session, however, has been aggressive during stay. Decreased safety awareness noted and impulsivity at times. Pt easily distracted during session.       General Comments General comments (skin integrity, edema, etc.): Pt's caregiver present outside of room at beginning  of session and pt's son present inside of room during session. Both assisted with PLOF and home information.     Exercises     Assessment/Plan    PT Assessment Patient needs continued PT services  PT Problem List Decreased safety awareness;Decreased knowledge of precautions;Decreased knowledge of use of DME;Decreased cognition;Decreased mobility;Decreased balance;Decreased strength       PT Treatment Interventions DME instruction;Gait training;Functional mobility training;Therapeutic activities;Therapeutic exercise;Balance training;Patient/family education;Cognitive remediation    PT Goals (Current goals can be found in the Care Plan section)  Acute Rehab PT Goals Patient Stated Goal: for pt to be able to walk per son  PT Goal Formulation: With family Time For Goal Achievement: 01/27/18 Potential to Achieve Goals: Good    Frequency Min 2X/week   Barriers to discharge        Co-evaluation               AM-PAC PT "6 Clicks" Daily Activity  Outcome Measure Difficulty turning over in bed (including adjusting bedclothes, sheets and blankets)?: Unable Difficulty moving from lying on back to sitting on the side of the bed? : Unable Difficulty sitting down on and standing up from a chair with arms (e.g., wheelchair, bedside commode, etc,.)?: Unable Help needed moving to and from a bed to chair (including a wheelchair)?: A Lot Help needed walking in hospital room?: A Lot Help needed climbing 3-5 steps with a railing? : A Lot 6 Click Score: 9    End of Session   Activity Tolerance: Patient tolerated treatment well Patient left: in chair;with call bell/phone within reach;with chair alarm set;with family/visitor present;with nursing/sitter in room Nurse Communication: Mobility status PT Visit Diagnosis: Unsteadiness on feet (R26.81);Muscle weakness (generalized) (M62.81);History of falling (Z91.81)    Time: 5916-3846 PT Time Calculation (min) (ACUTE ONLY): 21  min   Charges:   PT Evaluation $PT Eval Moderate Complexity: [place], PT, DPT  Acute Rehabilitation Services  Pager: [phone] Office: [phone]   [name] 01/13/2018, 5:40 PM

## 2018-01-13 NOTE — Progress Notes (Signed)
Initial Nutrition Assessment  DOCUMENTATION CODES:   Not applicable  INTERVENTION:  Ensure TID  Recommend swallow eval Recommend MVI  NUTRITION DIAGNOSIS:   Inadequate oral intake related to lethargy/confusion, decreased appetite as evidenced by per patient/family report.   GOAL:   Patient will meet greater than or equal to 90% of their needs   MONITOR:   PO intake, Supplement acceptance, Diet advancement, Weight trends  REASON FOR ASSESSMENT:   Malnutrition Screening Tool    ASSESSMENT:    Pt with PMH of dementia, prostate cancer, chronic urinary retention. Pt admitted due to altered mental status potentially due to UTI.    Pt unable to provide history as he was combative and confused. Caregiver and family member were able to answer questions.   Caregiver prepares meals at home for pt and takes care of daily needs. Pt normally eats 3 times a day. Sausage, eggs, oatmeal for breakfast Pt has not been eating as well for at least the past 2 weeks. Pt has had choking and gagging PTA and while here. Nurse put pt on Dysphagia 2 diet.   Caregiver and son reported gradual 25# weight loss over the past 10 weeks. Not supported by weight encounters. 5.5% weight loss in past 7 months per chart. Caregiver believes loss is due to pt not eating as much, dementia and little appetite.   Medications Reviewed and Include:   Labs Reviewed: Potassium 3.4 (L)  Glucose 107 (H)   NUTRITION - FOCUSED PHYSICAL EXAM:  Pt combative unable to complete physical exam.  Diet Order:   Diet Order            DIET DYS 2 Room service appropriate? Yes; Fluid consistency: Thin  Diet effective now              EDUCATION NEEDS:   No education needs have been identified at this time  Skin:  Skin Assessment: (MASD to groin and buttocks)  Last BM:  11/20  Height:   Ht Readings from Last 1 Encounters:  01/12/18 5\' 9"  (1.753 m)    Weight:   Wt Readings from Last 1 Encounters:   01/12/18 83.5 kg    Ideal Body Weight:  72.7 kg  BMI:  Body mass index is 27.17 kg/m.  Estimated Nutritional Needs:   Kcal:  1800-2000  Protein:  85-100 g  Fluid:  >1.8 L     Mauricia Area, MS, Dietetic Intern Pager: [phone] After hours Pager: [phone]

## 2018-01-13 NOTE — Progress Notes (Signed)
[facility] PROGRESS NOTE  Almond Fitzgibbon FVC:[id number] DOB: 03/08/1940 DOA: 01/12/2018 PCP: Lauree Chandler, NP   Narrative: 77 year old male dementia prostate cancer urinary retention Found by home health nurse to be off the floor and altered from usual baseline Previously seen 11/18 for confusion and agitation and somnolence-work-up planned but unable to complete  Emergency room work-up showed normal vital signs CT head nothing acute urinalysis consistent with UTI  A & Plan Toxic metabolic encephalopathy secondary to dementia superimposed upon by acute UTI Klebsiella urinary infection continue ceftriaxone await further speciation of the same, continue D5 saline 100 cc/h as only moderate p.o. intake Somnolence-now more awake will attempt diet Dementia with superimposed behavioral disturbance continue Seroquel 100 at bedtime sertraline 50 twice daily trazodone 50 at bedtime and cut back if needed Chronic kidney disease stage I Mild hypokalemia-no replaced ?  Complex social case-son is [facility] POA as has been confirmed with social work however caregivers and him seem to have differing opinions on best placement for the patient-monitor may need skilled placement therapy services have been contacted       DVT prophylaxis: Lovenox code Status: DNR family Communication: None present called son on phone disposition Plan: Inpatient   Hollister Wessler, MD  [facility] Direct contact: [phone] --[place]ia [place]  --[email or web address]; password [email or web address]  7PM-7AM contact night coverage as above 01/13/2018, 2:05 PM  LOS: 1 day   Consultants:  None  Procedures:  No  Antimicrobials:  Ceftriaxone  Interval history/Subjective: Awake but not coherent not clear cannot orient completely  Objective:  Vitals:  Vitals:   01/13/18 0825 01/13/18 1221  BP: 108/68 97/70  Pulse: 63 62  Resp: 13 15  Temp: 98.4 F (36.9 C) (!) 97.3 F (36.3 C)  SpO2: 95% 96%    Exam: CAT  EOMI IN NO  APPARENT DISTRESS S1-S2 no murmur Abdomen soft No visible breakdown  I have personally reviewed the following:   Labs:  Potassium 3.4  Imaging studies:  None  Medical tests:  n   Test discussed with performing physician:  n  Decision to obtain old records:  n  Review and summation of old records:  n  Scheduled Meds: . enoxaparin (LOVENOX) injection  40 mg Subcutaneous Q24H  . sertraline  50 mg Oral BID  . traZODone  50 mg Oral QHS   Continuous Infusions: . cefTRIAXone (ROCEPHIN)  IV 1 g (01/13/18 1156)  . dextrose 5 % and 0.9% NaCl 100 mL/hr (01/13/18 0739)    Principal Problem:   AMS (altered mental status) Active Problems:   Dementia ([place])   Acute lower UTI   Aggressive behavior   LOS: 1 day

## 2018-01-13 NOTE — Progress Notes (Signed)
PT Cancellation Note  Patient Details Name: Ricky Boyd MRN: [id number] DOB: 08/29/1940   Cancelled Treatment:    Reason Eval/Treat Not Completed: Active bedrest order Pt currently with strict bed rest orders. Will follow up once activity orders updated and as schedule allows.   Leighton Ruff, PT, DPT  Acute Rehabilitation Services  Pager: [phone] Office: [phone]    [name] 01/13/2018, 3:20 PM

## 2018-01-14 LAB — CBC WITH DIFFERENTIAL/PLATELET
Abs Immature Granulocytes: 0.01 10*3/uL (ref 0.00–0.07)
BASOS ABS: 0 10*3/uL (ref 0.0–0.1)
Basophils Relative: 1 %
EOS ABS: 0.1 10*3/uL (ref 0.0–0.5)
EOS PCT: 3 %
HEMATOCRIT: 38.6 % — AB (ref 39.0–52.0)
HEMOGLOBIN: 12.6 g/dL — AB (ref 13.0–17.0)
Immature Granulocytes: 0 %
LYMPHS ABS: 0.9 10*3/uL (ref 0.7–4.0)
LYMPHS PCT: 22 %
MCH: 29.9 pg (ref 26.0–34.0)
MCHC: 32.6 g/dL (ref 30.0–36.0)
MCV: 91.5 fL (ref 80.0–100.0)
MONO ABS: 0.4 10*3/uL (ref 0.1–1.0)
MONOS PCT: 9 %
NRBC: 0 % (ref 0.0–0.2)
Neutro Abs: 2.5 10*3/uL (ref 1.7–7.7)
Neutrophils Relative %: 65 %
Platelets: 144 10*3/uL — ABNORMAL LOW (ref 150–400)
RBC: 4.22 MIL/uL (ref 4.22–5.81)
RDW: 14.1 % (ref 11.5–15.5)
WBC: 3.9 10*3/uL — ABNORMAL LOW (ref 4.0–10.5)

## 2018-01-14 LAB — RENAL FUNCTION PANEL
Albumin: 3.1 g/dL — ABNORMAL LOW (ref 3.5–5.0)
Anion gap: 5 (ref 5–15)
BUN: 14 mg/dL (ref 8–23)
CALCIUM: 8.7 mg/dL — AB (ref 8.9–10.3)
CHLORIDE: 112 mmol/L — AB (ref 98–111)
CO2: 25 mmol/L (ref 22–32)
CREATININE: 0.99 mg/dL (ref 0.61–1.24)
GFR calc Af Amer: >60 mL/min (ref >60)
GFR calc non Af Amer: >60 mL/min (ref >60)
GLUCOSE: 102 mg/dL — AB (ref 70–99)
Phosphorus: 3 mg/dL (ref 2.5–4.6)
Potassium: 3 mmol/L — ABNORMAL LOW (ref 3.5–5.1)
SODIUM: 142 mmol/L (ref 135–145)

## 2018-01-14 LAB — URINE CULTURE

## 2018-01-14 MED ORDER — CIPROFLOXACIN HCL 500 MG PO TABS
500.0000 mg | ORAL_TABLET | Freq: Two times a day (BID) | ORAL | Status: DC
  Administered 2018-01-14 – 2018-01-16 (×5): 500 mg via ORAL
  Filled 2018-01-14 (×5): qty 1

## 2018-01-14 MED ORDER — POTASSIUM CHLORIDE CRYS ER 20 MEQ PO TBCR
40.0000 meq | EXTENDED_RELEASE_TABLET | Freq: Every day | ORAL | Status: DC
  Administered 2018-01-14: 40 meq via ORAL
  Filled 2018-01-14: qty 2

## 2018-01-14 NOTE — NC FL2 (Signed)
[place] MEDICAID FL2 LEVEL OF CARE SCREENING TOOL     IDENTIFICATION  Patient Name: Ricky Boyd Birthdate: 1940/09/30 Sex: male Admission Date (Current Location): 01/12/2018  [facility] [place] Number:  Herbalist and Address:  [facility] [place] [address], [place], [place] [address]      Provider Number: [id number]  Attending Physician Name and Address:  Nita Sells, MD  Relative Name and Phone Number:  Asberry Lascola; [phone]; sons    Current Level of Care: Hospital Recommended Level of Care: [place] Prior Approval Number:    Date Approved/Denied:   PASRR Number: [id number]  Discharge Plan: SNF    Current Diagnoses: Patient Active Problem List   Diagnosis Date Noted  . AMS (altered mental status) 01/12/2018  . Aggressive behavior 01/12/2018  . Rhabdomyolysis 11/07/2017  . Normocytic anemia 10/28/2016  . Hyperglycemia 10/25/2016  . Thrombocytopenia ([place]) 10/25/2016  . Hematuria 10/24/2016  . Urinary retention 10/08/2016  . Dementia ([place]) 09/16/2016  . Acute lower UTI 09/16/2016  . Malignant neoplasm of prostate ([place]) 04/23/2016  . Erectile dysfunction 07/23/2015  . Elevated PSA, less than 10 ng/ml 06/27/2012  . GOUT 12/26/2008  . 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 History of colonic polyps 10/28/2006  . 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    Orientation RESPIRATION BLADDER Height & Weight     Self  Normal Continent, External catheter Weight: 184 lb (83.5 kg) Height:  5\' 9"  (175.3 cm)  BEHAVIORAL SYMPTOMS/MOOD NEUROLOGICAL BOWEL NUTRITION STATUS      Continent Diet(see discharge summary)  AMBULATORY STATUS COMMUNICATION OF NEEDS Skin   Extensive Assist Verbally Other (Comment)(MASD on buttocks and groin)                       Personal Care Assistance Level of Assistance  Bathing, Feeding, Dressing Bathing Assistance: Maximum assistance Feeding assistance: Limited assistance Dressing  Assistance: Maximum assistance     Functional Limitations Info  Sight, Hearing, Speech Sight Info: Adequate Hearing Info: Impaired Speech Info: Impaired    SPECIAL CARE FACTORS FREQUENCY  PT (By licensed PT), OT (By licensed OT)     PT Frequency: 5x week OT Frequency: 5x week            Contractures Contractures Info: Not present    Additional Factors Info  Code Status, Allergies Code Status Info: DNR Allergies Info: HYDROCODONE, INDOCIN INDOMETHACIN, AMOXICILLIN-POT CLAVULANATE, ERYTHROMYCIN ETHYLSUCCINATE            Current Medications (01/14/2018):  This is the current hospital active medication list Current Facility-Administered Medications  Medication Dose Route Frequency Provider Last Rate Last Dose  . ciprofloxacin (CIPRO) tablet 500 mg  500 mg Oral BID Nita Sells, MD   500 mg at 01/14/18 1008  . dextrose 5 %-0.9 % sodium chloride infusion   Intravenous Continuous Schorr, Rhetta Mura, NP 100 mL/hr at 01/13/18 2130    . enoxaparin (LOVENOX) injection 40 mg  40 mg Subcutaneous Q24H Janora Norlander, MD   40 mg at 01/13/18 1718  . feeding supplement (ENSURE ENLIVE) (ENSURE ENLIVE) liquid 237 mL  237 mL Oral TID BM Nita Sells, MD   237 mL at 01/14/18 1008  . potassium chloride SA (K-DUR,KLOR-CON) CR tablet 40 mEq  40 mEq Oral Daily Nita Sells, MD   40 mEq at 01/14/18 1008  . QUEtiapine (SEROQUEL) tablet 100 mg  100 mg Oral QHS PRN Janora Norlander, MD   100 mg at 01/13/18 2118  .  sertraline (ZOLOFT) tablet 50 mg  50 mg Oral BID Janora Norlander, MD   50 mg at 01/14/18 1008  . traZODone (DESYREL) tablet 50 mg  50 mg Oral QHS Janora Norlander, MD   50 mg at 01/13/18 2118     Discharge Medications: Please see discharge summary for a list of discharge medications.  Relevant Imaging Results:  Relevant Lab Results:   Additional Information [id number] [place], [place]

## 2018-01-14 NOTE — Progress Notes (Addendum)
[facility] PROGRESS NOTE  Ricky Boyd OZH:[id number] DOB: 1940/09/21 DOA: 01/12/2018 PCP: Lauree Chandler, NP   Narrative: 77 year old male dementia prostate cancer urinary retention Found by home health nurse to be off the floor and altered from usual baseline Previously seen 11/18 for confusion and agitation and somnolence-work-up planned but unable to complete  Emergency room work-up showed normal vital signs CT head nothing acute urinalysis consistent with UTI  A & Plan Toxic metabolic encephalopathy secondary to dementia superimposed upon by acute UTI Appears a safety sitter was elicited  Klebsiella urinary infection-changing to po cipro complete 2-3 more days in absence of + BC, change saline and d/c the same Attempt diet  Somnolence-now more awake will attempt diet  Dementia with superimposed behavioral disturbance continue Seroquel 100 at bedtime sertraline 50 twice daily trazodone 50 at bedtime and cut back if needed  Chronic kidney disease stage I Mild hypokalemia-replacing today with Kdru 40  ?  Complex social case-son is [facility] POA as has been confirmed with social work however caregivers and him seem to have differing opinions on best placement for the patient-monitor may need skilled placement therapy services have been contacted    DVT prophylaxis: Lovenox code Status: DNR family Communication: None present called son on phone disposition Plan: Inpatient   Braulio Kiedrowski, MD  [facility] Direct contact: [phone] --[address] [place]  --[email or web address]; password [email or web address]  7PM-7AM contact night coverage as above 01/14/2018, 9:12 AM  LOS: 2 days   Consultants:  None  Procedures:  No  Antimicrobials:  Ceftriaxone  Interval history/Subjective:  Awake but non-coherent in nad Cannot tewll me place-think he is in school currently Cannot tell me date nor year  Objective:  Vitals:  Vitals:   01/13/18 1756 01/14/18 0858  BP: 98/79 (!) 95/52   Pulse: 74 (!) 56  Resp: 20 16  Temp: 97.8 F (36.6 C) 97.9 F (36.6 C)  SpO2: (!) 78% 96%    Exam: CAT  EOMI IN NO APPARENT DISTRESS no pallor no ict S1-S2 no murmur Abdomen soft No visible breakdown Moves 4 limbs eqaully No facial droop-power is good  I have personally reviewed the following:   Labs:  Potassium 3.4->3.0  WBC 3.9, Hemoglobin 13-->12.6  Imaging studies:  None  Medical tests:  n   Test discussed with performing physician:  n  Decision to obtain old records:  n  Review and summation of old records:  n  Scheduled Meds: . ciprofloxacin  500 mg Oral BID  . enoxaparin (LOVENOX) injection  40 mg Subcutaneous Q24H  . feeding supplement (ENSURE ENLIVE)  237 mL Oral TID BM  . potassium chloride  40 mEq Oral Daily  . sertraline  50 mg Oral BID  . traZODone  50 mg Oral QHS   Continuous Infusions: . dextrose 5 % and 0.9% NaCl 100 mL/hr at 01/13/18 2130    Principal Problem:   AMS (altered mental status) Active Problems:   Dementia ([place])   Acute lower UTI   Aggressive behavior   LOS: 2 days

## 2018-01-14 NOTE — Clinical Social Work Note (Signed)
Clinical Social Work Assessment  Patient Details  Name: Ricky Boyd MRN: [id number] Date of Birth: Jul 04, 1940  Date of referral:  01/14/18               Reason for consult:  Facility Placement                Permission sought to share information with:  Facility Sport and exercise psychologist, Family Supports Permission granted to share information::  Yes, Verbal Permission Granted  Name::     [facility]  Agency::  SNF  Relationship::  Son, Brother, Copywriter, advertising Information:     Housing/Transportation Living arrangements for the past 2 months:  Single Family Home Source of Information:  Medical Team, Adult Children Patient Interpreter Needed:  None Criminal Activity/Legal Involvement Pertinent to Current Situation/Hospitalization:  No - Comment as needed Significant Relationships:  Adult Children, Siblings, Community Support Lives with:  Self Do you feel safe going back to the place where you live?  Yes Need for family participation in patient care:  Yes (Comment)  Care giving concerns:  Patient from home with caregiver support part of the day, but will need short term rehab at discharge. Patient's caregiver and brother concerned because the patient's son has not been agreeable to SNF in the past, or has pulled his dad out of SNF.   Social Worker assessment / plan:  CSW met with patient's brother and caregiver at bedside, and called son over the phone. Patient's family would like to pursue SNF. Patient's caregivers discussed how the patient really doesn't need to be home alone, and they say that the son is on board. During discussion with son, he says he knows that the patient will do better at rehab for a little bit. [name] would like to pursue [facility], if possible. Caregiver concerned because patient's son has pulled the patient out early at [facility], so they're not sure if they'll take him back.  Employment status:  Retired Nurse, adult PT  Recommendations:  [place] / Referral to community resources:  [place]  Patient/Family's Response to care:  Patient's family agreeable to SNF.  Patient/Family's Understanding of and Emotional Response to Diagnosis, Current Treatment, and Prognosis:  Patient's caregivers and brother discussed how the patient's son can be unrealistic about what's going on with the patient and what he needs. [name] appeared to understanding needs during discussion, and acknowledged that it wasn't safe for his dad to go back home right now. Patient's caregiver was tearful during discussion, that she really can't stand to see what's happening to the patient at home happen any longer.   Emotional Assessment Appearance:  Appears stated age Attitude/Demeanor/Rapport:  Unable to Assess Affect (typically observed):  Unable to Assess Orientation:  Oriented to Self Alcohol / Substance use:  Not Applicable Psych involvement (Current and /or in the community):  No (Comment)  Discharge Needs  Concerns to be addressed:  Care Coordination Readmission within the last 30 days:  No Current discharge risk:  Physical Impairment, Cognitively Impaired, Dependent with Mobility, Lives alone Barriers to Discharge:  Continued Medical Work up, [place], [place] 01/14/2018, 3:58 PM

## 2018-01-14 NOTE — Care Management Note (Signed)
Case Management Note  Patient Details  Name: Williams Dietrick MRN: [id number] Date of Birth: May 06, 1940  Subjective/Objective:     Pt admitted with altered mental status. He is from home with caregivers. Per Caregivers he does not have 24 hour supervision.                Action/Plan: Recommendations are for SNF. CM following for d/c disposition.  Expected Discharge Date:                  Expected Discharge Plan:  Skilled Nursing Facility  In-House Referral:  Clinical Social Work  Discharge planning Services     Post Acute Care Choice:    Choice offered to:     DME Arranged:    DME Agency:     HH Arranged:    [place] Agency:     Status of Service:  In process, will continue to follow  If discussed at Long Length of Stay Meetings, dates discussed:    Additional Comments:  Pollie Friar, RN 01/14/2018, 1:54 PM

## 2018-01-14 NOTE — Evaluation (Signed)
Occupational Therapy Evaluation Patient Details Name: Ricky Boyd MRN: [id number] DOB: 1940/06/12 Today's Date: 01/14/2018    History of Present Illness Pt is a 77 y/o male admitted secondary to AMS. Per pt's caregiver, pt has become more agressive and has increased difficulty with ambulation. PMH includes prostate cancer and dementia.    Clinical Impression   This 77 y/o male presents with the above. No family/caregiver present at time of eval, though per chart review pt had caregiver for ADL/iADL assist. Per chart pt was having increased difficulty with mobility and requiring increased assist. Pt limited in responses this session though overall pleasant. He required max cues to follow simple commands, doing so inconsistently this session. Pt requiring mod-maxA (+2 safety) for bed mobility to perform peri-care as pt incontinent of bowel/bladder. Pt requiring totalA for peri-care. Attempted to progress to EOB though pt somewhat resistive to attempts and not following commands to do so. Pt will benefit from continued OT services and recommend follow up therapy services in SNF setting to maximize his overall safety and independence with ADLs and mobility. Will continue to follow while he remains in acute setting.     Follow Up Recommendations  SNF;Supervision/Assistance - 24 hour    Equipment Recommendations  Other (comment)(TBD in next venue)           Precautions / Restrictions Precautions Precautions: Fall;Other (comment) Precaution Comments: Pt has been aggressive at times. Was pleasant during OT session.  Restrictions Weight Bearing Restrictions: No      Mobility Bed Mobility Overal bed mobility: Needs Assistance Bed Mobility: Rolling Rolling: Max assist;Mod assist         General bed mobility comments: maxA initially to roll to the L; decreased assist when rolling to the R (modA); max cues throughout  Transfers Overall transfer level: Needs assistance                General transfer comment: deferred this session, attempted mobility however pt somehwat resistive to movements, moved pt's LEs over EOB to sit up and pt bringing them back onto bed. didn't push much further given pt's hx of agression     Balance                                           ADL either performed or assessed with clinical judgement   ADL Overall ADL's : Needs assistance/impaired Eating/Feeding: Minimal assistance;Sitting   Grooming: Minimal assistance;Bed level                       Toileting- Clothing Manipulation and Hygiene: Total assistance;+2 for safety/equipment;Bed level Toileting - Clothing Manipulation Details (indicate cue type and reason): pt incontinent of urine and bowel at start of session, performed bed mobility for peri-care and to change bed linens; modA overall for bed mobility       General ADL Comments: somewhat limited due to pt cognition and pt inconsistently following commands, did not want to push considering pt with hx of agression      Vision         Perception     Praxis      Pertinent Vitals/Pain Faces Pain Scale: No hurt     Hand Dominance     Extremity/Trunk Assessment Upper Extremity Assessment Upper Extremity Assessment: Generalized weakness   Lower Extremity Assessment Lower Extremity Assessment: Defer to PT evaluation  Cervical / Trunk Assessment Cervical / Trunk Assessment: Normal   Communication Communication Communication: HOH(has hearing aides )   Cognition Arousal/Alertness: Awake/alert Behavior During Therapy: Flat affect Overall Cognitive Status: History of cognitive impairments - at baseline                                 General Comments: Was pleasant throughout session, however, has been aggressive during stay. Pt easily distracted. Requires max cues to follow simple commands.    General Comments  no family present during session, "friend" arriving end of  session upon exiting room    Exercises     Shoulder Roscoe expects to be discharged to:: Skilled nursing facility                                 Additional Comments: Per PT eval, PT spoke with pt's caregiver, pt has been increasingly agressive and is unsafe to return home. Per caregiver, did not want that information shared with his son [name]. No family present at time of OT eval.      Prior Functioning/Environment Level of Independence: Needs assistance  Gait / Transfers Assistance Needed: Had caregiver 24/7 for safety. Per pt's son, could walk, but was having increased difficulty and needed more assist.               OT Problem List: Decreased strength;Decreased range of motion;Decreased activity tolerance;Impaired balance (sitting and/or standing);Decreased cognition;Decreased safety awareness      OT Treatment/Interventions: Self-care/ADL training;Therapeutic exercise;DME and/or AE instruction;Therapeutic activities;Patient/family education;Balance training    OT Goals(Current goals can be found in the care plan section) Acute Rehab OT Goals Patient Stated Goal: none stated OT Goal Formulation: Patient unable to participate in goal setting Time For Goal Achievement: 01/28/18 Potential to Achieve Goals: Good  OT Frequency: Min 2X/week   Barriers to D/C:            Co-evaluation              AM-PAC PT "6 Clicks" Daily Activity     Outcome Measure Help from another person eating meals?: A Little Help from another person taking care of personal grooming?: A Lot Help from another person toileting, which includes using toliet, bedpan, or urinal?: Total Help from another person bathing (including washing, rinsing, drying)?: Total Help from another person to put on and taking off regular upper body clothing?: Total Help from another person to put on and taking off regular lower body clothing?: Total 6 Click  Score: 9   End of Session Nurse Communication: Mobility status  Activity Tolerance: Patient tolerated treatment well Patient left: in bed;with call bell/phone within reach;with bed alarm set  OT Visit Diagnosis: Muscle weakness (generalized) (M62.81);Other symptoms and signs involving cognitive function                Time: 1421-1441 OT Time Calculation (min): 20 min Charges:  OT General Charges $OT Visit: 1 Visit OT Evaluation $OT Eval Moderate Complexity: [place], OT [employer] Pager [phone] Office [phone]   [name] 01/14/2018, 3:54 PM

## 2018-01-14 NOTE — Plan of Care (Signed)
  Problem: Coping: Goal: Level of anxiety will decrease Outcome: Not Progressing   Problem: Clinical Measurements: Goal: Ability to maintain clinical measurements within normal limits will improve Outcome: Progressing   

## 2018-01-14 NOTE — Care Management Important Message (Signed)
Important Message  Patient Details  Name: Ricky Boyd MRN: [id number] Date of Birth: 1940-10-04   Medicare Important Message Given:  Yes    [name] [address] 01/14/2018, 2:52 PM

## 2018-01-15 MED ORDER — POTASSIUM CHLORIDE CRYS ER 20 MEQ PO TBCR
40.0000 meq | EXTENDED_RELEASE_TABLET | Freq: Two times a day (BID) | ORAL | Status: DC
  Administered 2018-01-15 – 2018-01-18 (×7): 40 meq via ORAL
  Filled 2018-01-15 (×7): qty 2

## 2018-01-15 NOTE — Progress Notes (Signed)
Pt remained very confused , pt pulled off telemetry leads  multiple times this shift. and reapplied by Nursing staff. CCMT notified that pt is confused.  RN came back to room again to fixed tele leads and foun  pt ripped off the diaper on him and eating   the pieces of the cotton lining. Nurse attempted removing pieces from the pt's mouth but noted pt chewed and swallowed  some of it.  Re orieted medicated and po snack given  Peri care done and made comfortable in bed. Bil Mitten applied to prevent pt from . pulling off tele and other medical devices. Marland Kitchen

## 2018-01-15 NOTE — Progress Notes (Signed)
P t confused attempted to get oob ,  Re oriented and redirected and assisted back to bed bed alarm to sensitive setting and floor mat  In use.  Will continue to monitor pt.

## 2018-01-15 NOTE — Progress Notes (Signed)
[facility] PROGRESS NOTE  Ricky Boyd TMA:[id number] DOB: 08-Aug-1940 DOA: 01/12/2018 PCP: Ricky Chandler, NP   Narrative: 77 year old male dementia prostate cancer urinary retention Found by home health nurse to be off the floor and altered from usual baseline Previously seen 11/18 for confusion and agitation and somnolence-work-up planned but unable to complete  Emergency room work-up showed normal vital signs CT head nothing acute urinalysis consistent with UTI  A & Plan Toxic metabolic encephalopathy secondary to dementia superimposed upon by acute UTI Still confused not agitated No new issues-seems close to basleine per caregiver at bedsdie  Klebsiella urinary infection-changing to po cipro complete 2-3 more days in absence of + BC, change saline and d/c the same Attempt diet when able  Somnolence-resolved-likely related to alteed sleep waeful cycles  Dementia with superimposed behavioral disturbance continue Seroquel 100 at bedtime sertraline 50 twice daily trazodone 50 at bedtime and cut back if needed  Chronic kidney disease stage I Mild hypokalemia--repalcing again with now [place] [facility]  ?  Complex social case-son is Ricky Boyd POA as has been confirmed with social workmonitor may need skilled placement therapy services have been contacted    DVT prophylaxis: Lovenox code Status: DNR family Communication: discussed wit caregiver at the bedsdie disposition Plan: Inpatient   Ricky Tripodi, MD  [facility] Direct contact: [phone] --[place] [place]  --[email or web address]; password [email or web address]  7PM-7AM contact night coverage as above 01/15/2018, 2:51 PM  LOS: 3 days   Consultants:  None  Procedures:  No  Antimicrobials:  Ceftriaxone  Interval history/Subjective:  Awake not completely coherent-overll confused but not agitated  Objective:  Vitals:  Vitals:   01/15/18 0847 01/15/18 1148  BP: 112/84 117/74  Pulse: (!) 52 (!) 56  Resp: 16 16  Temp: 98 F  (36.7 C) 98.4 F (36.9 C)  SpO2: 97% 97%    Exam: eomi ncat No pallor no ict cta b abd soft nt nd no rebound No le edema  I have personally reviewed the following:   Labs:  Potassium 3.4->3.0  WBC 3.9, Hemoglobin 13-->12.6  Imaging studies:  None  Medical tests:  n   Test discussed with performing physician:  n  Decision to obtain old records:  n  Review and summation of old records:  n  Scheduled Meds: . ciprofloxacin  500 mg Oral BID  . enoxaparin (LOVENOX) injection  40 mg Subcutaneous Q24H  . feeding supplement (ENSURE ENLIVE)  237 mL Oral TID BM  . potassium chloride  40 mEq Oral BID  . sertraline  50 mg Oral BID  . traZODone  50 mg Oral QHS   Continuous Infusions:   Principal Problem:   AMS (altered mental status) Active Problems:   Dementia (HCC)   Acute lower UTI   Aggressive behavior   LOS: 3 days

## 2018-01-16 LAB — RENAL FUNCTION PANEL
Albumin: 3.1 g/dL — ABNORMAL LOW (ref 3.5–5.0)
Anion gap: 6 (ref 5–15)
BUN: 10 mg/dL (ref 8–23)
CHLORIDE: 105 mmol/L (ref 98–111)
CO2: 27 mmol/L (ref 22–32)
CREATININE: 0.95 mg/dL (ref 0.61–1.24)
Calcium: 8.8 mg/dL — ABNORMAL LOW (ref 8.9–10.3)
GFR calc Af Amer: >60 mL/min (ref >60)
GFR calc non Af Amer: >60 mL/min (ref >60)
Glucose, Bld: 96 mg/dL (ref 70–99)
POTASSIUM: 3.8 mmol/L (ref 3.5–5.1)
Phosphorus: 3.1 mg/dL (ref 2.5–4.6)
Sodium: 138 mmol/L (ref 135–145)

## 2018-01-16 MED ORDER — HALOPERIDOL LACTATE 5 MG/ML IJ SOLN
5.0000 mg | Freq: Once | INTRAMUSCULAR | Status: AC
  Administered 2018-01-16: 5 mg via INTRAVENOUS
  Filled 2018-01-16: qty 1

## 2018-01-16 NOTE — Progress Notes (Signed)
Pt was awake all night did not sleep until 0630 that he closes his eyes. Naps  on and off this am

## 2018-01-16 NOTE — Progress Notes (Signed)
Mr Meinhardt son  Genevie Ann in to visit and say goodnight to pt. RN explained to son why mitten is on both hand. As ptwas pulling of Tele an attempted getting oob   He verbalized good understanding and offer his appreciation. Pt awake but calm at present.. Son still at bedside. No distress noted.

## 2018-01-16 NOTE — Progress Notes (Signed)
[facility] PROGRESS NOTE  Ricky Boyd BDZ:[id number] DOB: Mar 24, 1940 DOA: 01/12/2018 PCP: Lauree Chandler, NP   Narrative: 77 year old male dementia prostate cancer urinary retention Found by home health nurse to be off the floor and altered from usual baseline Previously seen 11/18 for confusion and agitation and somnolence-work-up planned but unable to complete  Emergency room work-up showed normal vital signs CT head nothing acute urinalysis consistent with UTI  A & Plan Toxic metabolic encephalopathy secondary to dementia superimposed upon by acute UTI Looks less confused not as agitated No new issues-nearing readiness for discharge  Klebsiella urinary infection-leave the total of 5 days total antibiotics discontinue antibiotics 7/24  Somnolence-resolved-likely related to alteed sleep waeful cycles Poor sleep during the night have attempted to keep him on schedule  Dementia with superimposed behavioral disturbance continue Seroquel 100 at bedtime sertraline 50 twice daily trazodone 50 at bedtime and cut back if needed  Chronic kidney disease stage I Mild hypokalemia--repalcing again with now [place]  ?  Complex social case-son is [facility] POA as has been confirmed with social workmonitor may need skilled placement therapy services have been contacted    DVT prophylaxis: Lovenox code Status: DNR family Communication: discussed wit caregiver at the bedsdie disposition Plan: Likely in absence of further issues can discharge in a.m. a.m. to skilled facility   Verlon Au, MD  [facility] Direct contact: [phone] --[address] [place]  --[email or web address]; password [email or web address]  7PM-7AM contact night coverage as above 01/16/2018, 3:51 PM  LOS: 4 days   Consultants:  None  Procedures:  No  Antimicrobials:  Ceftriaxone  Interval history/Subjective:  Confused but pleasant Seems near baseline state Objective:  Vitals:  Vitals:   01/16/18 0850 01/16/18 1237   BP: 128/74 100/73  Pulse: (!) 58 63  Resp: 16 16  Temp: (!) 97.4 F (36.3 C) 98 F (36.7 C)  SpO2: 94% 97%    Exam: eomi ncat No pallor no ict Mucosas moist  cta b abd soft nt nd no rebound No le edema  I have personally reviewed the following:   Labs:  Potassium 3.4->3.0-3.8   Imaging studies:  None  Medical tests:  n   Test discussed with performing physician:  n  Decision to obtain old records:  n  Review and summation of old records:  n  Scheduled Meds: . ciprofloxacin  500 mg Oral BID  . enoxaparin (LOVENOX) injection  40 mg Subcutaneous Q24H  . feeding supplement (ENSURE ENLIVE)  237 mL Oral TID BM  . potassium chloride  40 mEq Oral BID  . sertraline  50 mg Oral BID  . traZODone  50 mg Oral QHS   Continuous Infusions:   Principal Problem:   AMS (altered mental status) Active Problems:   Dementia (HCC)   Acute lower UTI   Aggressive behavior   LOS: 4 days

## 2018-01-16 NOTE — Plan of Care (Signed)
  Problem: Clinical Measurements: Goal: Ability to maintain clinical measurements within normal limits will improve Outcome: Progressing   Problem: Health Behavior/Discharge Planning: Goal: Ability to manage health-related needs will improve Outcome: Progressing

## 2018-01-16 NOTE — Progress Notes (Signed)
At this time, pt appears very confuse, attempting to get OOB. He possibly be sundowning at this time, ripping TELE off. TELE on standby at this time. Will admin nightly prn meds to aid with sleep. Next shift RN aware.   Ave Filter, RN

## 2018-01-16 NOTE — Progress Notes (Signed)
Resting in bed  Awake but not fidgeting with tele  nor any medical device at this time. Po Juice offered will continue to monitor.for safety.

## 2018-01-17 MED ORDER — POTASSIUM CHLORIDE CRYS ER 20 MEQ PO TBCR: 40.0000 meq | EXTENDED_RELEASE_TABLET | Freq: Two times a day (BID) | ORAL | 0 refills | Status: AC

## 2018-01-17 MED ORDER — QUETIAPINE FUMARATE 100 MG PO TABS: 100.0000 mg | ORAL_TABLET | Freq: Every evening | ORAL | 0 refills | Status: AC | PRN

## 2018-01-17 MED ORDER — SERTRALINE HCL 100 MG PO TABS: 50.0000 mg | ORAL_TABLET | Freq: Two times a day (BID) | ORAL | 0 refills | Status: AC

## 2018-01-17 MED ORDER — TRAZODONE HCL 50 MG PO TABS: 50.0000 mg | ORAL_TABLET | Freq: Every day | ORAL | 0 refills | Status: AC

## 2018-01-17 NOTE — Progress Notes (Signed)
Occupational Therapy Treatment Patient Details Name: Ricky Boyd MRN: [id number] DOB: 1940-03-24 Today's Date: 01/17/2018    History of present illness Pt is a 77 y/o male admitted 01/12/18 secondary to AMS. Per pt's caregiver, pt has become more agressive and has increased difficulty with ambulation. CT head without acute abnormality. Urinalysis consistent with UTI. Worked up for Toxic metabolic encephalopathy secondary to dementia superimposed upon by acute UTI. PMH includes prostate CA, dementia.    OT comments  Pt progressing towards OT goals, and with progression in functional mobility this session. Pt initially requiring modA (+2 safety) to maintain standing balance at EOB due to strong posterior lean. Once pt with functional purpose for moving (needing to urinate), pt able to complete mobility using RW with overall minA (+2 safety). Pt performing simple grooming task seated EOB with setup assist. Pt does require significant time for processing, and use of short simple commands to follow instruction. Requires cues for redirection to task as he is easily distracted. Overall pt pleasant during this session. Continue to recommend SNF at time of discharge to maximize pt's safety and independence with ADLs and mobility. Will continue to follow acutely.    Follow Up Recommendations  SNF;Supervision/Assistance - 24 hour    Equipment Recommendations  Other (comment)(TBD in next venue)          Precautions / Restrictions Precautions Precautions: Fall;Other (comment) Precaution Comments: Pt has been aggressive at times. Was pleasant during PT/OT session.  Restrictions Weight Bearing Restrictions: No       Mobility Bed Mobility Overal bed mobility: Needs Assistance Bed Mobility: Supine to Sit;Sit to Supine     Supine to sit: Mod assist;HOB elevated Sit to supine: Supervision   General bed mobility comments: Pt not initially following commands for sitting up, eventually requiring  modA to assist trnk elevation and scoot hips. Supervision for returning to supine; increased time requiring cues for sequencing  Transfers Overall transfer level: Needs assistance   Transfers: Sit to/from Stand Sit to Stand: Min assist;+2 safety/equipment;Mod assist         General transfer comment: MinA to assist trunk elevation, balance, and steady RW; pt with significant posterior lean upon standing requiring modA to prevent posterior LOB. Not correcting with multimodal cues, but able to translate weight forward automatically when pt stating he had to pee    Balance Overall balance assessment: Needs assistance Sitting-balance support: No upper extremity supported;Feet supported Sitting balance-Leahy Scale: Fair     Standing balance support: Bilateral upper extremity supported;During functional activity Standing balance-Leahy Scale: Poor Standing balance comment: Reliant on UE support                           ADL either performed or assessed with clinical judgement   ADL Overall ADL's : Needs assistance/impaired     Grooming: Set up;Min guard;Sitting;Wash/dry face Grooming Details (indicate cue type and reason): simple cue to initiate, with cue pt performing basic task without assist                              Functional mobility during ADLs: Minimal assistance;Rolling walker;+2 for safety/equipment General ADL Comments: pt requires increased time to process and follow commands. Initially with very strong posterior lean in standing requiring up to modA for standing balance, though while standing pt verbalizing need to urinate (pt with catheter at this time), once pt with purpose for moving (needing to  go to bathroom) pt able to perform functional mobility with overall minA (+2 safety) using RW                       Cognition Arousal/Alertness: Awake/alert Behavior During Therapy: Flat affect Overall Cognitive Status: History of cognitive  impairments - at baseline Area of Impairment: Attention;Memory;Following commands;Awareness;Problem solving                   Current Attention Level: Sustained   Following Commands: Follows one step commands with increased time;Follows one step commands inconsistently   Awareness: Intellectual Problem Solving: Slow processing;Decreased initiation;Difficulty sequencing;Requires verbal cues;Requires tactile cues General Comments: Responds to "[name]". Few verbalizations during session, but appropriate. Able to follow simple, one-step commands ~25% of session; better doing so when automatic-type movements. Easily distracted         Exercises     Shoulder Instructions       General Comments      Pertinent Vitals/ Pain       Pain Assessment: No/denies pain  Home Living                                          Prior Functioning/Environment              Frequency  Min 2X/week        Progress Toward Goals  OT Goals(current goals can now be found in the care plan section)  Progress towards OT goals: Progressing toward goals  Acute Rehab OT Goals Patient Stated Goal: none stated OT Goal Formulation: Patient unable to participate in goal setting Time For Goal Achievement: 01/28/18 Potential to Achieve Goals: Good ADL Goals Pt Will Perform Eating: with supervision;sitting Pt Will Perform Grooming: with supervision;sitting Pt Will Perform Upper Body Dressing: with min assist;sitting Additional ADL Goal #1: Pt will perform bed mobility with minA as precursor to EOB/OOB ADL.  Plan Discharge plan remains appropriate    Co-evaluation                 AM-PAC OT "6 Clicks" Daily Activity     Outcome Measure   Help from another person eating meals?: A Little Help from another person taking care of personal grooming?: A Little Help from another person toileting, which includes using toliet, bedpan, or urinal?: A Lot Help from another person  bathing (including washing, rinsing, drying)?: A Lot Help from another person to put on and taking off regular upper body clothing?: A Lot Help from another person to put on and taking off regular lower body clothing?: A Lot 6 Click Score: 14    End of Session Equipment Utilized During Treatment: Gait belt;Rolling walker  OT Visit Diagnosis: Muscle weakness (generalized) (M62.81);Other symptoms and signs involving cognitive function   Activity Tolerance Patient tolerated treatment well   Patient Left in bed;with call bell/phone within reach;with bed alarm set   Nurse Communication Mobility status        Time: 1000-1030 OT Time Calculation (min): 30 min  Charges: OT General Charges $OT Visit: 1 Visit OT Treatments $Self Care/Home Management : 8-22 mins  Lou Cal, OT [facility] Pager [phone] Office [phone]    [name] 01/17/2018, 12:25 PM

## 2018-01-17 NOTE — Progress Notes (Signed)
Pt slept well last night  On and off dose off this am BP slightly soft MD made aware no new orders no distress peri care done and pt resting quietly with no agitation this am.

## 2018-01-17 NOTE — Progress Notes (Signed)
CSW following for discharge plan. CSW alerted by [facility] earlier today that they are unable to offer a bed for the patient. CSW contacted patient's son to update him on status of [facility] refusing. CSW provided patient's son with bed offers. [name] requested CSW reach out to [facility]. CSW sent referral, but [facility] declined. [name] has chosen [facility]. CSW confirmed bed availability and asked admissions to start insurance authorization.  Patient continues to await authorization. CSW to follow.  Laveda Abbe, [place] Clinical Social Worker [phone]

## 2018-01-17 NOTE — Progress Notes (Signed)
Prior to receiving report from Ist shift RN.  Mr . Festa attemped to get oob., was  very confused /agitated, pulling off telemetry leads & combative as nursing staff were trying to assist pt back to bed  ,MD on call  Text paged. new order  for haldol 5 mg  IV given with some  effect ,PM medication given later and it help calm pt down.   Personal assist for ADL/ came in to visit pt and pt finally fell asleep around 2330 CNA then left...  Cardiac monitor  reapplied  and verified.  Will continue to monitor pt  closely.

## 2018-01-17 NOTE — Discharge Summary (Signed)
Physician Discharge Summary  Ricky Boyd FKC:[id number] DOB: [birth year] DOA: 01/12/2018  PCP: Lauree Chandler, NP  Admit date: 01/12/2018 Discharge date: 01/17/2018  Time spent: 25 minutes  Recommendations for Outpatient Follow-up:  1. Prescription given for all antipsychotics and dementia related medications 2. Recommend outpatient goals of care  Discharge Diagnoses:  Principal Problem:   AMS (altered mental status) Active Problems:   Dementia ([place])   Acute lower UTI   Aggressive behavior   Discharge Condition: Guarded overall  Diet recommendation: Heart healthy  Filed Weights   01/12/18 1317  Weight: 83.5 kg    History of present illness:  77 year old male dementia prostate cancer urinary retention Found by home health nurse to be off the floor and altered from usual baseline Previously seen 11/18 for confusion and agitation and somnolence-work-up planned but unable to complete  Emergency room work-up showed normal vital signs CT head nothing acute urinalysis consistent with UTI  Hospital Course:  Toxic metabolic encephalopathy secondary to dementia superimposed upon by acute UTI Looks less confused not as agitated No new issues-other than some confusion at nights-overall has been stable without any fevers or chills   Klebsiella urinary infection-completed total 5 days total antibiotics discontinue antibiotics 11/24  Somnolence-resolved-likely related to alteed sleep waeful cycles Poor sleep during the night have attempted to keep him on schedule  Dementia with superimposed behavioral disturbance continue Seroquel 100 at bedtime sertraline 50 twice daily trazodone 50 at bedtime and cut back if needed  Chronic kidney disease stage I Mild hypokalemia--repalcing again with now [place] 40 bir  Consultations:  None  Discharge Exam: Vitals:   01/17/18 0600 01/17/18 0739  BP: 90/70 (!) 92/57  Pulse: 67 63  Resp:  18  Temp: (!) 97 F (36.1 C) 97.8 F  (36.6 C)  SpO2: 100%     General: Awake confused pleasant oriented no distress Cardiovascular: S1-S2 no murmur Respiratory: Clinically clear no added sound Abdomen soft nontender no rebound No lower extremity edema   Discharge Instructions   Discharge Instructions    Diet - low sodium heart healthy   Complete by:  As directed      Allergies as of 01/17/2018      Reactions   Hydrocodone Other (See Comments)   Reaction:  Hallucinations   Indocin [indomethacin] Other (See Comments)   Reaction:  Confusion    Amoxicillin-pot Clavulanate Other (See Comments)   Reaction:  Nightmares Has patient had a PCN reaction causing immediate rash, facial/tongue/throat swelling, SOB or lightheadedness with hypotension: No Has patient had a PCN reaction causing severe rash involving mucus membranes or skin necrosis: No Has patient had a PCN reaction that required hospitalization: No Has patient had a PCN reaction occurring within the last 10 years: Yes If all of the above answers are "NO", then may proceed with Cephalosporin use.   Erythromycin Ethylsuccinate Other (See Comments)   Reaction:  Eye irritation       Medication List    STOP taking these medications   camphor-menthol lotion Commonly known as:  SARNA     TAKE these medications   potassium chloride SA 20 MEQ tablet Commonly known as:  K-DUR,KLOR-CON Take 2 tablets (40 mEq total) by mouth 2 (two) times daily.   QUEtiapine 100 MG tablet Commonly known as:  SEROQUEL Take 1 tablet (100 mg total) by mouth at bedtime as needed.   sertraline 100 MG tablet Commonly known as:  ZOLOFT Take 0.5 tablets (50 mg total) by mouth 2 (two) times daily.  traZODone 50 MG tablet Commonly known as:  DESYREL Take 1 tablet (50 mg total) by mouth at bedtime.      Allergies  Allergen Reactions  . Hydrocodone Other (See Comments)    Reaction:  Hallucinations  . Indocin [Indomethacin] Other (See Comments)    Reaction:  Confusion   .  Amoxicillin-Pot Clavulanate Other (See Comments)    Reaction:  Nightmares Has patient had a PCN reaction causing immediate rash, facial/tongue/throat swelling, SOB or lightheadedness with hypotension: No Has patient had a PCN reaction causing severe rash involving mucus membranes or skin necrosis: No Has patient had a PCN reaction that required hospitalization: No Has patient had a PCN reaction occurring within the last 10 years: Yes If all of the above answers are "NO", then may proceed with Cephalosporin use.  . Erythromycin Ethylsuccinate Other (See Comments)    Reaction:  Eye irritation       The results of significant diagnostics from this hospitalization (including imaging, microbiology, ancillary and laboratory) are listed below for reference.    Significant Diagnostic Studies: Dg Chest 2 View  Result Date: 01/12/2018 CLINICAL DATA:  Altered mental status. EXAM: CHEST - 2 VIEW COMPARISON:  11/09/2016 radiographs FINDINGS: The cardiomediastinal silhouette is unchanged with prominent thoracic aorta. Chronic interstitial opacities/fibrosis again noted. There is no evidence of focal airspace disease, pulmonary edema, suspicious pulmonary nodule/mass, pleural effusion, or pneumothorax. No acute bony abnormalities are identified. IMPRESSION: 1. No evidence of acute abnormality 2. Unchanged prominent thoracic aorta and interstitial lung disease/fibrosis. Electronically Signed   By: Margarette Canada M.D.   On: 01/12/2018 14:19   Ct Head Wo Contrast  Result Date: 01/12/2018 CLINICAL DATA:  Initial evaluation for acute altered mental status. EXAM: CT HEAD WITHOUT CONTRAST TECHNIQUE: Contiguous axial images were obtained from the base of the skull through the vertex without intravenous contrast. COMPARISON:  Prior CT from 11/07/2017. FINDINGS: Brain: Age-related cerebral atrophy with chronic small vessel ischemic disease. No acute intracranial hemorrhage. No acute large vessel territory infarct. No  mass lesion, midline shift or mass effect. No hydrocephalus. No extra-axial fluid collection. Vascular: No hyperdense vessel. Scattered vascular calcifications noted within the carotid siphons. Skull: Scalp soft tissues and calvarium within normal limits. Sinuses/Orbits: Globes and orbital soft tissues within normal limits. Mild scattered mucosal thickening within the ethmoidal air cells. Paranasal sinuses otherwise clear. No mastoid effusion. Other: None. IMPRESSION: 1. No acute intracranial abnormality. 2. Age-related cerebral atrophy with chronic small vessel ischemic disease, stable. Electronically Signed   By: Jeannine Boga M.D.   On: 01/12/2018 14:11    Microbiology: Recent Results (from the past 240 hour(s))  Urine culture     Status: Abnormal   Collection Time: 01/12/18  1:46 PM  Result Value Ref Range Status   Specimen Description URINE, RANDOM  Final   Special Requests   Final    NONE Performed at [facility], [address], [place], [place] [address]    Culture >=100,000 COLONIES/mL KLEBSIELLA PNEUMONIAE (A)  Final   Report Status 01/14/2018 FINAL  Final   Organism ID, Bacteria KLEBSIELLA PNEUMONIAE (A)  Final      Susceptibility   Klebsiella pneumoniae - MIC*    AMPICILLIN >=32 RESISTANT Resistant     CEFAZOLIN <=4 SENSITIVE Sensitive     CEFTRIAXONE <=1 SENSITIVE Sensitive     CIPROFLOXACIN <=0.25 SENSITIVE Sensitive     GENTAMICIN <=1 SENSITIVE Sensitive     IMIPENEM <=0.25 SENSITIVE Sensitive     NITROFURANTOIN 128 RESISTANT Resistant  TRIMETH/SULFA <=20 SENSITIVE Sensitive     AMPICILLIN/SULBACTAM >=32 RESISTANT Resistant     PIP/TAZO <=4 SENSITIVE Sensitive     Extended ESBL NEGATIVE Sensitive     * >=100,000 COLONIES/mL KLEBSIELLA PNEUMONIAE     Labs: Basic Metabolic Panel: Recent Labs  Lab 01/10/18 1230 01/12/18 1335 01/13/18 0326 01/14/18 0306 01/16/18 0609  NA 139 137 139 142 138  K 3.5 4.0 3.4* 3.0* 3.8  CL 100 102 107 112* 105  CO2  29 25 24 25 27   GLUCOSE 130* 106* 107* 102* 96  BUN 20 17 18 14 10   CREATININE 1.02 1.04 1.01 0.99 0.95  CALCIUM 9.8 9.9 8.9 8.7* 8.8*  PHOS  --   --   --  3.0 3.1   Liver Function Tests: Recent Labs  Lab 01/10/18 1230 01/12/18 1335 01/14/18 0306 01/16/18 0609  AST 21 26  --   --   ALT 15 17  --   --   ALKPHOS  --  70  --   --   BILITOT 0.6 0.9  --   --   PROT 8.1 8.3*  --   --   ALBUMIN  --  4.3 3.1* 3.1*   No results for input(s): LIPASE, AMYLASE in the last 168 hours. No results for input(s): AMMONIA in the last 168 hours. CBC: Recent Labs  Lab 01/10/18 1230 01/12/18 1335 01/13/18 0326 01/14/18 0306  WBC 5.1 5.8 4.1 3.9*  NEUTROABS 3,682 5.0  --  2.5  HGB 16.4 15.7 13.1 12.6*  HCT 46.9 47.4 40.7 38.6*  MCV 89.2 91.5 90.8 91.5  PLT 176 150 160 144*   Cardiac Enzymes: Recent Labs  Lab 01/12/18 1335  CKTOTAL 242   BNP: BNP (last 3 results) No results for input(s): BNP in the last 8760 hours.  ProBNP (last 3 results) No results for input(s): PROBNP in the last 8760 hours.  CBG: No results for input(s): GLUCAP in the last 168 hours.     Signed:  Nita Sells MD   [facility] 01/17/2018, 10:15 AM

## 2018-01-17 NOTE — Progress Notes (Signed)
Physical Therapy Treatment Patient Details Name: Ricky Boyd MRN: [id number] DOB: 1941/01/09 Today's Date: 01/17/2018    History of Present Illness Pt is a 77 y/o male admitted 01/12/18 secondary to AMS. Per pt's caregiver, pt has become more agressive and has increased difficulty with ambulation. CT head without acute abnormality. Urinalysis consistent with UTI. Worked up for Toxic metabolic encephalopathy secondary to dementia superimposed upon by acute UTI. PMH includes prostate CA, dementia.    PT Comments    Pt progressing with mobility. Pt easily distracted throughout session, requiring frequent, multimodal cues to attend to task and for sequencing. Able to ambulate short distance, requiring bilateral HHA and modA for balance; initiated gait training with RW, pt requiring min-modA to prevent posterior LOB. Pt pleasant throughout session, although difficult to cue at times. Continue to recommend post-acute rehab and 24/7 supervision prior to return home.   Follow Up Recommendations  SNF;Supervision/Assistance - 24 hour     Equipment Recommendations  None recommended by PT    Recommendations for Other Services       Precautions / Restrictions Precautions Precautions: Fall;Other (comment) Precaution Comments: Pt has been aggressive at times. Was pleasant during PT/OT session.  Restrictions Weight Bearing Restrictions: No    Mobility  Bed Mobility Overal bed mobility: Needs Assistance Bed Mobility: Supine to Sit;Sit to Supine     Supine to sit: Mod assist;HOB elevated Sit to supine: Supervision   General bed mobility comments: Pt not initially following commands for sitting up, eventually requiring modA to assist trnk elevation and scoot hips. Supervision for returning to supine; increased time requiring cues for sequencing  Transfers Overall transfer level: Needs assistance   Transfers: Sit to/from Stand Sit to Stand: Min assist;+2 safety/equipment;Mod assist          General transfer comment: MinA to assist trunk elevation, balance, and steady RW; pt with significant posterior lean upon standing requiring modA to prevent posterior LOB. Not correcting with multimodal cues, but able to translate weight forward automatically when pt stating he had to pee  Ambulation/Gait Ambulation/Gait assistance: Min guard;Min assist;Mod assist Gait Distance (Feet): 60 Feet   Gait Pattern/deviations: Staggering left;Step-through pattern;Decreased stride length;Trunk flexed;Narrow base of support;Leaning posteriorly Gait velocity: Decreased Gait velocity interpretation: <1.31 ft/sec, indicative of household ambulator General Gait Details: Slow, unsteady amb with RW; +2 assist for safety. Intermittent min-modA to prevent posterior LOB; pt with frequent R lateral sway requiring minA to correct. Pt easily distracted requiring frequent cues to stay on task   Stairs             Wheelchair Mobility    Modified Rankin (Stroke Patients Only)       Balance Overall balance assessment: Needs assistance Sitting-balance support: No upper extremity supported;Feet supported Sitting balance-Leahy Scale: Fair     Standing balance support: Bilateral upper extremity supported;During functional activity Standing balance-Leahy Scale: Poor Standing balance comment: Reliant on UE support                            Cognition Arousal/Alertness: Awake/alert Behavior During Therapy: Flat affect Overall Cognitive Status: History of cognitive impairments - at baseline Area of Impairment: Attention;Memory;Following commands;Awareness;Problem solving                   Current Attention Level: Sustained   Following Commands: Follows one step commands with increased time;Follows one step commands inconsistently   Awareness: Intellectual Problem Solving: Slow processing;Decreased initiation;Difficulty sequencing;Requires verbal cues;Requires tactile  cues General Comments: Responds to "[name]". Few verbalizations during session, but appropriate. Able to follow simple, one-step commands ~25% of session; better doing so when automatic-type movements. Easily distracted       Exercises      General Comments        Pertinent Vitals/Pain Pain Assessment: No/denies pain    Home Living                      Prior Function            PT Goals (current goals can now be found in the care plan section) Acute Rehab PT Goals Patient Stated Goal: none stated PT Goal Formulation: With patient Time For Goal Achievement: 01/27/18 Potential to Achieve Goals: Good Progress towards PT goals: Progressing toward goals    Frequency    Min 2X/week      PT Plan Current plan remains appropriate    Co-evaluation              AM-PAC PT "6 Clicks" Mobility   Outcome Measure  Help needed turning from your back to your side while in a flat bed without using bedrails?: A Little Help needed moving from lying on your back to sitting on the side of a flat bed without using bedrails?: A Lot Help needed moving to and from a bed to a chair (including a wheelchair)?: A Little Help needed standing up from a chair using your arms (e.g., wheelchair or bedside chair)?: A Little Help needed to walk in hospital room?: A Lot Help needed climbing 3-5 steps with a railing? : A Lot 6 Click Score: 15    End of Session Equipment Utilized During Treatment: Gait belt Activity Tolerance: Patient tolerated treatment well Patient left: in bed;with call bell/phone within reach;with bed alarm set Nurse Communication: Mobility status PT Visit Diagnosis: Unsteadiness on feet (R26.81);Muscle weakness (generalized) (M62.81);History of falling (Z91.81)     Time: 1000-1030 PT Time Calculation (min) (ACUTE ONLY): 30 min  Charges:  $Gait Training: 8-22 mins                    Mabeline Caras, PT, DPT Acute Rehabilitation Services  Pager  [phone] Office [place] 01/17/2018, 11:06 AM

## 2018-01-18 NOTE — Clinical Social Work Placement (Addendum)
Nurse to call report to [phone], Room 130B  Nurse please call patient's son Otila Kluver when picked up: [phone]     CLINICAL SOCIAL WORK PLACEMENT  NOTE  Date:  01/18/2018  Patient Details  Name: Ricky Boyd MRN: [id number] Date of Birth: 11/02/1940  Clinical Social Work is seeking post-discharge placement for this patient at the [place] level of care (*CSW will initial, date and re-position this form in  chart as items are completed):  Yes   Patient/family provided with [facility] Work Department's list of facilities offering this level of care within the geographic area requested by the patient (or if unable, by the patient's family).  Yes   Patient/family informed of their freedom to choose among providers that offer the needed level of care, that participate in Medicare, Medicaid or managed care program needed by the patient, have an available bed and are willing to accept the patient.  Yes   Patient/family informed of [facility]'s ownership interest in [facility] and [facility], as well as of the fact that they are under no obligation to receive care at these facilities.  PASRR submitted to EDS on       PASRR number received on       Existing PASRR number confirmed on       FL2 transmitted to all facilities in geographic area requested by pt/family on       FL2 transmitted to all facilities within larger geographic area on       Patient informed that his/her managed care company has contracts with or will negotiate with certain facilities, including the following:        Yes   Patient/family informed of bed offers received.  Patient chooses bed at [facility]     Physician recommends and patient chooses bed at      Patient to be transferred to [facility] on 01/18/18.  Patient to be transferred to facility by [facility]     Patient family notified on 01/18/18 of transfer.  Name of family member  notified:  Son, brother     PHYSICIAN       Additional Comment:    _______________________________________________ Geralynn Ochs, LCSW 01/18/2018, 12:23 PM

## 2018-01-18 NOTE — Plan of Care (Signed)
  Problem: Education: Goal: Knowledge of General Education information will improve Description: Including pain rating scale, medication(s)/side effects and non-pharmacologic comfort measures Outcome: Completed/Met   

## 2018-01-18 NOTE — Progress Notes (Signed)
Patient alert but slight confused and seems about same as piro and is ready for D/c to skilled facility  Ricky Griffes, MD [facility] 11:45 AM

## 2018-01-18 NOTE — Care Management Important Message (Signed)
Important Message  Patient Details  Name: Ricky Boyd MRN: [id number] Date of Birth: Jul 31, 1940   Medicare Important Message Given:  Yes    Barb Merino Tykera Skates 01/18/2018, 3:57 PM

## 2018-01-18 NOTE — Progress Notes (Signed)
Patient for discharge to [facility] today.  Patient is cooperative, following command, pleasantly confused.  IV discontinued by Linus Orn, telemetry removed.  Report called to RN receiving patient at [facility].  Son informed of patient's transport via [facility] upon discharged.

## 2018-01-19 ENCOUNTER — Telehealth: Payer: Self-pay

## 2018-01-19 NOTE — Telephone Encounter (Signed)
Called son who stated he is at [facility] rehab center after his d/c from the hospital. Informed to call [facility] to make appointment with Sherrie Mustache, NP.

## 2018-02-19 DIAGNOSIS — N181 Chronic kidney disease, stage 1: Secondary | ICD-10-CM

## 2018-02-19 DIAGNOSIS — F419 Anxiety disorder, unspecified: Secondary | ICD-10-CM

## 2018-02-19 DIAGNOSIS — G309 Alzheimer's disease, unspecified: Secondary | ICD-10-CM

## 2018-02-19 DIAGNOSIS — D631 Anemia in chronic kidney disease: Secondary | ICD-10-CM

## 2018-02-19 DIAGNOSIS — F0280 Dementia in other diseases classified elsewhere without behavioral disturbance: Secondary | ICD-10-CM

## 2018-02-19 DIAGNOSIS — N390 Urinary tract infection, site not specified: Secondary | ICD-10-CM

## 2018-02-19 DIAGNOSIS — G319 Degenerative disease of nervous system, unspecified: Secondary | ICD-10-CM

## 2018-02-19 DIAGNOSIS — J8410 Pulmonary fibrosis, unspecified: Secondary | ICD-10-CM

## 2018-02-28 ENCOUNTER — Ambulatory Visit: Payer: Medicare Other | Admitting: Nurse Practitioner

## 2018-03-04 ENCOUNTER — Telehealth: Payer: Self-pay | Admitting: *Deleted

## 2018-03-04 NOTE — Telephone Encounter (Signed)
Marlowe Kays with [facility] called and left message on clinical intake stating that patient needs a Rx written for a Hospital Bed with full rails due to difficulty with bed mobility. Stated that caregiver is having to lift patient up off of a mattress on the floor. Wants Rx faxed to [facility]. Please Advise.

## 2018-03-04 NOTE — Telephone Encounter (Signed)
He will need an office visit if he wants an order for hospital bed, there has to be proper documentation to qualify for this.

## 2018-03-07 NOTE — Telephone Encounter (Signed)
Connie Notified.

## 2018-03-08 ENCOUNTER — Emergency Department: Payer: Medicare Other

## 2018-03-08 ENCOUNTER — Telehealth: Payer: Self-pay | Admitting: *Deleted

## 2018-03-08 ENCOUNTER — Other Ambulatory Visit: Payer: Self-pay

## 2018-03-08 ENCOUNTER — Encounter: Payer: Self-pay | Admitting: *Deleted

## 2018-03-08 ENCOUNTER — Emergency Department
Admission: EM | Admit: 2018-03-08 | Discharge: 2018-03-08 | Disposition: A | Discharge status: Home / Self Care | Payer: Medicare Other | Attending: Emergency Medicine | Admitting: Emergency Medicine

## 2018-03-08 DIAGNOSIS — Z8546 Personal history of malignant neoplasm of prostate: Secondary | ICD-10-CM | POA: Insufficient documentation

## 2018-03-08 DIAGNOSIS — N3000 Acute cystitis without hematuria: Secondary | ICD-10-CM | POA: Insufficient documentation

## 2018-03-08 DIAGNOSIS — X58XXXA Exposure to other specified factors, initial encounter: Secondary | ICD-10-CM | POA: Insufficient documentation

## 2018-03-08 DIAGNOSIS — R519 Headache, unspecified: Secondary | ICD-10-CM

## 2018-03-08 DIAGNOSIS — Y929 Unspecified place or not applicable: Secondary | ICD-10-CM | POA: Diagnosis not present

## 2018-03-08 DIAGNOSIS — Y999 Unspecified external cause status: Secondary | ICD-10-CM | POA: Diagnosis not present

## 2018-03-08 DIAGNOSIS — S065XAA Traumatic subdural hemorrhage with loss of consciousness status unknown, initial encounter: Secondary | ICD-10-CM

## 2018-03-08 DIAGNOSIS — Z87891 Personal history of nicotine dependence: Secondary | ICD-10-CM | POA: Insufficient documentation

## 2018-03-08 DIAGNOSIS — R51 Headache: Secondary | ICD-10-CM | POA: Insufficient documentation

## 2018-03-08 DIAGNOSIS — Y939 Activity, unspecified: Secondary | ICD-10-CM | POA: Diagnosis not present

## 2018-03-08 DIAGNOSIS — S065X9A Traumatic subdural hemorrhage with loss of consciousness of unspecified duration, initial encounter: Secondary | ICD-10-CM | POA: Diagnosis not present

## 2018-03-08 DIAGNOSIS — S0990XA Unspecified injury of head, initial encounter: Secondary | ICD-10-CM | POA: Diagnosis present

## 2018-03-08 DIAGNOSIS — F0390 Unspecified dementia without behavioral disturbance: Secondary | ICD-10-CM | POA: Diagnosis not present

## 2018-03-08 LAB — CBC WITH DIFFERENTIAL/PLATELET
ABS IMMATURE GRANULOCYTES: 0.01 10*3/uL (ref 0.00–0.07)
BASOS PCT: 1 %
Basophils Absolute: 0 10*3/uL (ref 0.0–0.1)
EOS ABS: 0.2 10*3/uL (ref 0.0–0.5)
Eosinophils Relative: 4 %
HCT: 38.4 % — ABNORMAL LOW (ref 39.0–52.0)
Hemoglobin: 12.7 g/dL — ABNORMAL LOW (ref 13.0–17.0)
Immature Granulocytes: 0 %
Lymphocytes Relative: 20 %
Lymphs Abs: 0.9 10*3/uL (ref 0.7–4.0)
MCH: 31.3 pg (ref 26.0–34.0)
MCHC: 33.1 g/dL (ref 30.0–36.0)
MCV: 94.6 fL (ref 80.0–100.0)
MONO ABS: 0.5 10*3/uL (ref 0.1–1.0)
MONOS PCT: 12 %
Neutro Abs: 2.8 10*3/uL (ref 1.7–7.7)
Neutrophils Relative %: 63 %
PLATELETS: 147 10*3/uL — AB (ref 150–400)
RBC: 4.06 MIL/uL — ABNORMAL LOW (ref 4.22–5.81)
RDW: 14.5 % (ref 11.5–15.5)
WBC: 4.4 10*3/uL (ref 4.0–10.5)
nRBC: 0 % (ref 0.0–0.2)

## 2018-03-08 LAB — COMPREHENSIVE METABOLIC PANEL
ALT: 11 U/L (ref 0–44)
AST: 27 U/L (ref 15–41)
Albumin: 3.5 g/dL (ref 3.5–5.0)
Alkaline Phosphatase: 69 U/L (ref 38–126)
Anion gap: 7 (ref 5–15)
BUN: 21 mg/dL (ref 8–23)
CALCIUM: 8.4 mg/dL — AB (ref 8.9–10.3)
CHLORIDE: 108 mmol/L (ref 98–111)
CO2: 24 mmol/L (ref 22–32)
CREATININE: 0.96 mg/dL (ref 0.61–1.24)
Glucose, Bld: 88 mg/dL (ref 70–99)
Potassium: 4.2 mmol/L (ref 3.5–5.1)
Sodium: 139 mmol/L (ref 135–145)
Total Bilirubin: 1 mg/dL (ref 0.3–1.2)
Total Protein: 6.6 g/dL (ref 6.5–8.1)

## 2018-03-08 LAB — URINALYSIS, COMPLETE (UACMP) WITH MICROSCOPIC
Bilirubin Urine: NEGATIVE
Glucose, UA: NEGATIVE mg/dL
HGB URINE DIPSTICK: NEGATIVE
Ketones, ur: NEGATIVE mg/dL
Nitrite: NEGATIVE
PH: 6.5 (ref 5.0–8.0)
Protein, ur: 30 mg/dL — AB
SPECIFIC GRAVITY, URINE: 1.015 (ref 1.005–1.030)
WBC, UA: >50 WBC/hpf (ref 0–5)

## 2018-03-08 LAB — TROPONIN I: Troponin I: <0.03 ng/mL (ref <0.03)

## 2018-03-08 MED ORDER — OXYCODONE HCL 5 MG PO TABS: 5.0000 mg | ORAL_TABLET | ORAL | 0 refills | Status: AC | PRN

## 2018-03-08 MED ORDER — SODIUM CHLORIDE 0.9 % IV SOLN
1.0000 g | Freq: Once | INTRAVENOUS | Status: AC
  Administered 2018-03-08: 1 g via INTRAVENOUS
  Filled 2018-03-08: qty 10

## 2018-03-08 MED ORDER — CEPHALEXIN 500 MG PO CAPS: 500.0000 mg | ORAL_CAPSULE | Freq: Four times a day (QID) | ORAL | 0 refills | Status: AC

## 2018-03-08 NOTE — ED Provider Notes (Addendum)
[facility] Emergency Department Provider Note       Time seen: ----------------------------------------- 1:57 PM on 03/08/2018 -----------------------------------------   I have reviewed the triage vital signs and the nursing notes.  HISTORY   Chief Complaint No chief complaint on file.  Level V caveat: History/ROS limited by dementia  HPI Ricky Faucett is a 78 y.o. male with a history of Alzheimer's dementia, gout, pneumonia, prostate cancer who presents to the ED for headache.  Patient was recently treated for UTI.  Family is concerned he may have another urinary tract infection.  Unclear etiology for his headaches according to EMS.  No further information or review of systems is available.  Past Medical History:  Diagnosis Date  . Alzheimer disease ([place])   . Arthritis    "probably; comes w/age" (01/12/2018)  . Dementia ([place]) 09/16/2016  . Gout    usually knees, occasionally feet  . Hearing aid worn   . 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 Pneumonia    "couple times" (01/12/2018)  . Prostate cancer ([place])    S/P "radiation & seeds"    Patient Active Problem List   Diagnosis Date Noted  . AMS (altered mental status) 01/12/2018  . Aggressive behavior 01/12/2018  . Rhabdomyolysis 11/07/2017  . Normocytic anemia 10/28/2016  . Hyperglycemia 10/25/2016  . Thrombocytopenia ([place]) 10/25/2016  . Hematuria 10/24/2016  . Urinary retention 10/08/2016  . Dementia ([place]) 09/16/2016  . Acute lower UTI 09/16/2016  . Malignant neoplasm of prostate ([place]) 04/23/2016  . Erectile dysfunction 07/23/2015  . Elevated PSA, less than 10 ng/ml 06/27/2012  . GOUT 12/26/2008  . 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 History of colonic polyps 10/28/2006  . 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 COLONOSCOPY    . HEMORROIDECTOMY    . INSERTION PROSTATE RADIATION SEED    . PROSTATE BIOPSY    . TONSILLECTOMY AND  ADENOIDECTOMY      Allergies Hydrocodone; Indocin [indomethacin]; Amoxicillin-pot clavulanate; and Erythromycin ethylsuccinate  Social History Social History   Tobacco Use  . Smoking status: Former Smoker    Packs/day: 0.25    Years: 10.00    Pack years: 2.50    Types: Cigarettes    Last attempt to quit: 1963    Years since quitting: 57.0  . Smokeless tobacco: Former Systems developer    Types: Chew  Substance Use Topics  . Alcohol use: Not Currently    Comment: 01/12/2018 "nothing in 2019"  . Drug use: Never   Review of Systems View of systems otherwise unknown Neurological: Positive for headache  All systems negative/normal/unremarkable except as stated in the HPI  ____________________________________________   PHYSICAL EXAM:  VITAL SIGNS: ED Triage Vitals  Enc Vitals Group     BP      Pulse      Resp      Temp      Temp src      SpO2      Weight      Height      Head Circumference      Peak Flow      Pain Score      Pain Loc      Pain Edu?      Excl. in [place]?     Constitutional: Alert disoriented.  Well appearing and in no distress. Eyes: Conjunctivae are normal. Normal extraocular movements. Cardiovascular: Normal rate, regular rhythm. No murmurs, rubs, or gallops. Respiratory: Normal respiratory effort  without tachypnea nor retractions. Breath sounds are clear and equal bilaterally. No wheezes/rales/rhonchi. Gastrointestinal: Soft and nontender. Normal bowel sounds Musculoskeletal: Nontender with normal range of motion in extremities. No lower extremity tenderness nor edema. Neurologic:  Normal speech and language. No gross focal neurologic deficits are appreciated.  Skin:  Skin is warm, dry and intact. Posterior scalp hematoma Psychiatric: Mood and affect are normal. Speech and behavior are normal.  ____________________________________________  ED COURSE:  As part of my medical decision making, I reviewed the following data within the electronic medical  record:  History obtained from family if available, nursing notes, old chart and ekg, as well as notes from prior ED visits. Patient presented for altered mental status, possible headache or UTI, we will assess with labs and imaging as indicated at this time.   Procedures ____________________________________________   LABS (pertinent positives/negatives)  Labs Reviewed  CBC WITH DIFFERENTIAL/PLATELET - Abnormal; Notable for the following components:      Result Value   RBC 4.06 (*)    Hemoglobin 12.7 (*)    HCT 38.4 (*)    Platelets 147 (*)    All other components within normal limits  COMPREHENSIVE METABOLIC PANEL - Abnormal; Notable for the following components:   Calcium 8.4 (*)    All other components within normal limits  TROPONIN I  URINALYSIS, COMPLETE (UACMP) WITH MICROSCOPIC  CBG MONITORING, ED    RADIOLOGY  Ct head IMPRESSION: 1. Subacute subdural hematoma over the LEFT cerebral convexity with mild rightward midline shift. 2. Basal cisterns are patent. No hydrocephalus.  Critical Value/emergent results were called by telephone at the time of interpretation on 03/08/2018 at 3:35 pm to Dr. Lenise Arena , who verbally acknowledged these results. ____________________________________________   DIFFERENTIAL DIAGNOSIS   Dehydration, electrolyte abnormality, occult infection, subdural hematoma, CVA  FINAL ASSESSMENT AND PLAN  Altered mental status, UTI, subdural hematoma   Plan: The patient had presented for possible headache or UTI.  Patient's blood work was unremarkable, urine was indicative of UTI.  We did send a urine culture and give IV Rocephin.  CT head revealed subdural hematoma.  We discussed with neurosurgery.  I discussed with the son who has agreed to conservative management.  Neurosurgery will see him as an outpatient.   Laurence Aly, MD    Note: This note was generated in part or whole with voice recognition software. Voice recognition  is usually quite accurate but there are transcription errors that can and very often do occur. I apologize for any typographical errors that were not detected and corrected.     Earleen Newport, MD 03/08/18 1455    Earleen Newport, MD 03/08/18 [phone]

## 2018-03-08 NOTE — Telephone Encounter (Signed)
Spoke with [facility] and advised results, they will get the son to call back to reschedule follow-up

## 2018-03-08 NOTE — ED Notes (Signed)
Patient's caregiver states patient c/o headache since last night at 2000. Patient told caregiver that his head hurts. Home health RN saw the patient this morning.

## 2018-03-08 NOTE — Telephone Encounter (Signed)
[facility] calling asking for verbal order for UA C&S for confusion and foul order, please advise

## 2018-03-08 NOTE — Telephone Encounter (Signed)
He has not followed up since his hospitalization and rehab stay, lets get him in for a follow up at this time.  To increase fluid intake as well.

## 2018-03-08 NOTE — ED Triage Notes (Signed)
Per EMT report, patient's family called EMS due to having a headache for two days and possibly a repeat UTI. Patient was recently released from [facility] for treatment of UTI around Christmas. Patient' family told EMS that the patient did not come home with any meds from that placement and is unsure if he finished his course of antibiotics. Patient has dementia, Alzheimers. Patient is pleasant, calm and cooperative upon arrival. Patient denies pain.

## 2018-03-08 NOTE — ED Notes (Addendum)
Caregiver states patient did not sleep at all last night. Patient has left hand occasionally against left ear and would grimace.

## 2018-03-09 ENCOUNTER — Emergency Department
Admission: EM | Admit: 2018-03-09 | Discharge: 2018-03-09 | Disposition: A | Discharge status: Home / Self Care | Payer: Medicare Other | Attending: Emergency Medicine | Admitting: Emergency Medicine

## 2018-03-09 ENCOUNTER — Encounter: Payer: Self-pay | Admitting: Emergency Medicine

## 2018-03-09 ENCOUNTER — Emergency Department: Payer: Medicare Other

## 2018-03-09 DIAGNOSIS — X58XXXA Exposure to other specified factors, initial encounter: Secondary | ICD-10-CM | POA: Diagnosis not present

## 2018-03-09 DIAGNOSIS — Y929 Unspecified place or not applicable: Secondary | ICD-10-CM | POA: Diagnosis not present

## 2018-03-09 DIAGNOSIS — F0390 Unspecified dementia without behavioral disturbance: Secondary | ICD-10-CM

## 2018-03-09 DIAGNOSIS — Z87891 Personal history of nicotine dependence: Secondary | ICD-10-CM | POA: Diagnosis not present

## 2018-03-09 DIAGNOSIS — Y939 Activity, unspecified: Secondary | ICD-10-CM | POA: Diagnosis not present

## 2018-03-09 DIAGNOSIS — G308 Other Alzheimer's disease: Secondary | ICD-10-CM | POA: Insufficient documentation

## 2018-03-09 DIAGNOSIS — S065XAA Traumatic subdural hemorrhage with loss of consciousness status unknown, initial encounter: Secondary | ICD-10-CM

## 2018-03-09 DIAGNOSIS — Y999 Unspecified external cause status: Secondary | ICD-10-CM | POA: Insufficient documentation

## 2018-03-09 DIAGNOSIS — S065X9A Traumatic subdural hemorrhage with loss of consciousness of unspecified duration, initial encounter: Secondary | ICD-10-CM | POA: Insufficient documentation

## 2018-03-09 DIAGNOSIS — S0990XA Unspecified injury of head, initial encounter: Secondary | ICD-10-CM | POA: Diagnosis present

## 2018-03-09 DIAGNOSIS — F0280 Dementia in other diseases classified elsewhere without behavioral disturbance: Secondary | ICD-10-CM | POA: Diagnosis not present

## 2018-03-09 LAB — CBC WITH DIFFERENTIAL/PLATELET
ABS IMMATURE GRANULOCYTES: 0.01 10*3/uL (ref 0.00–0.07)
BASOS PCT: 1 %
Basophils Absolute: 0 10*3/uL (ref 0.0–0.1)
EOS PCT: 3 %
Eosinophils Absolute: 0.2 10*3/uL (ref 0.0–0.5)
HCT: 39.2 % (ref 39.0–52.0)
Hemoglobin: 13.1 g/dL (ref 13.0–17.0)
Immature Granulocytes: 0 %
Lymphocytes Relative: 17 %
Lymphs Abs: 0.9 10*3/uL (ref 0.7–4.0)
MCH: 30.5 pg (ref 26.0–34.0)
MCHC: 33.4 g/dL (ref 30.0–36.0)
MCV: 91.4 fL (ref 80.0–100.0)
MONO ABS: 0.6 10*3/uL (ref 0.1–1.0)
MONOS PCT: 12 %
Neutro Abs: 3.4 10*3/uL (ref 1.7–7.7)
Neutrophils Relative %: 67 %
PLATELETS: 145 10*3/uL — AB (ref 150–400)
RBC: 4.29 MIL/uL (ref 4.22–5.81)
RDW: 14.4 % (ref 11.5–15.5)
WBC: 5.1 10*3/uL (ref 4.0–10.5)
nRBC: 0 % (ref 0.0–0.2)

## 2018-03-09 LAB — COMPREHENSIVE METABOLIC PANEL
ALK PHOS: 73 U/L (ref 38–126)
ALT: 10 U/L (ref 0–44)
AST: 17 U/L (ref 15–41)
Albumin: 3.4 g/dL — ABNORMAL LOW (ref 3.5–5.0)
Anion gap: 5 (ref 5–15)
BUN: 16 mg/dL (ref 8–23)
CO2: 25 mmol/L (ref 22–32)
Calcium: 8.7 mg/dL — ABNORMAL LOW (ref 8.9–10.3)
Chloride: 106 mmol/L (ref 98–111)
Creatinine, Ser: 0.95 mg/dL (ref 0.61–1.24)
GFR calc Af Amer: >60 mL/min (ref >60)
GFR calc non Af Amer: >60 mL/min (ref >60)
Glucose, Bld: 109 mg/dL — ABNORMAL HIGH (ref 70–99)
Potassium: 3.9 mmol/L (ref 3.5–5.1)
Sodium: 136 mmol/L (ref 135–145)
Total Bilirubin: 1 mg/dL (ref 0.3–1.2)
Total Protein: 6.8 g/dL (ref 6.5–8.1)

## 2018-03-09 MED ORDER — NALOXONE HCL 2 MG/2ML IJ SOSY
0.4000 mg | PREFILLED_SYRINGE | Freq: Once | INTRAMUSCULAR | Status: AC
  Administered 2018-03-09: 0.4 mg via INTRAVENOUS
  Filled 2018-03-09: qty 2

## 2018-03-09 NOTE — ED Provider Notes (Signed)
[facility] Emergency Department Provider Note       Time seen: ----------------------------------------- 12:51 PM on 03/09/2018 -----------------------------------------   I have reviewed the triage vital signs and the nursing notes.  HISTORY   Chief Complaint pain control/hospice  Level V caveat: History/ROS limited by altered mental status  HPI Ricky Boyd is a 78 y.o. male with a history of Alzheimer's disease, osteoarthritis, pneumonia, prostate cancer, altered mental status, thrombocytopenia and a recent subdural hematoma and UTI who presents to the ED for possible hospice consult.  Patient was here yesterday and was diagnosed by me with a subdural hematoma.  Patient was given oxycodone for pain.  Past Medical History:  Diagnosis Date  . Alzheimer disease ([place])   . Arthritis    "probably; comes w/age" (01/12/2018)  . Dementia ([place]) 09/16/2016  . Gout    usually knees, occasionally feet  . Hearing aid worn   . 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 Pneumonia    "couple times" (01/12/2018)  . Prostate cancer ([place])    S/P "radiation & seeds"    Patient Active Problem List   Diagnosis Date Noted  . AMS (altered mental status) 01/12/2018  . Aggressive behavior 01/12/2018  . Rhabdomyolysis 11/07/2017  . Normocytic anemia 10/28/2016  . Hyperglycemia 10/25/2016  . Thrombocytopenia ([place]) 10/25/2016  . Hematuria 10/24/2016  . Urinary retention 10/08/2016  . Dementia ([place]) 09/16/2016  . Acute lower UTI 09/16/2016  . Malignant neoplasm of prostate ([place]) 04/23/2016  . Erectile dysfunction 07/23/2015  . Elevated PSA, less than 10 ng/ml 06/27/2012  . GOUT 12/26/2008  . 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 History of colonic polyps 10/28/2006  . 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 COLONOSCOPY    . HEMORROIDECTOMY    . INSERTION PROSTATE RADIATION SEED    . PROSTATE BIOPSY    . TONSILLECTOMY  AND ADENOIDECTOMY      Allergies Hydrocodone; Indocin [indomethacin]; Amoxicillin-pot clavulanate; and Erythromycin ethylsuccinate  Social History Social History   Tobacco Use  . Smoking status: Former Smoker    Packs/day: 0.25    Years: 10.00    Pack years: 2.50    Types: Cigarettes    Last attempt to quit: 1963    Years since quitting: 57.0  . Smokeless tobacco: Former Systems developer    Types: Chew  Substance Use Topics  . Alcohol use: Not Currently    Comment: 01/12/2018 "nothing in 2019"  . Drug use: Never    Review of Systems Unknown, recent reports of headache  All systems negative/normal/unremarkable except as stated in the HPI  ____________________________________________   PHYSICAL EXAM:  VITAL SIGNS: ED Triage Vitals  Enc Vitals Group     BP 03/09/18 1244 95/71     Pulse Rate 03/09/18 1244 70     Resp 03/09/18 1244 10     Temp 03/09/18 1244 (!) 97.5 F (36.4 C)     Temp Source 03/09/18 1244 Axillary     SpO2 03/09/18 1244 97 %     Weight 03/09/18 1237 150 lb 8 oz (68.3 kg)     Height 03/09/18 1237 5\' 6"  (1.676 m)     Head Circumference --      Peak Flow --      Pain Score --      Pain Loc --      Pain Edu? --      Excl. in [place]? --    Constitutional: Lethargic, no  distress Eyes: Conjunctivae are normal. Normal extraocular movements.  Pupils are 3 mm and equal bilaterally ENT      Head: Normocephalic and atraumatic.      Nose: No congestion/rhinnorhea.      Mouth/Throat: Mucous membranes are moist.      Neck: No stridor. Cardiovascular: Normal rate, regular rhythm. No murmurs, rubs, or gallops. Respiratory: Normal respiratory effort without tachypnea nor retractions. Breath sounds are clear and equal bilaterally. No wheezes/rales/rhonchi. Gastrointestinal: Soft and nontender. Normal bowel sounds Musculoskeletal: No lower extremity tenderness nor edema. Neurologic:  No gross focal neurologic deficits are appreciated.  Skin:  Skin is warm, dry and intact.  No rash noted. ____________________________________________  ED COURSE:  As part of my medical decision making, I reviewed the following data within the [place] History obtained from family if available, nursing notes, old chart and ekg, as well as notes from prior ED visits. Patient presented for possible hospice consult for recent subdural hematoma, we will assess with labs and imaging as indicated at this time.   Procedures ____________________________________________   LABS (pertinent positives/negatives)  Labs Reviewed  CBC WITH DIFFERENTIAL/PLATELET - Abnormal; Notable for the following components:      Result Value   Platelets 145 (*)    All other components within normal limits  COMPREHENSIVE METABOLIC PANEL - Abnormal; Notable for the following components:   Glucose, Bld 109 (*)    Calcium 8.7 (*)    Albumin 3.4 (*)    All other components within normal limits    RADIOLOGY Images were viewed by me  CT head IMPRESSION: 1. Subacute left subdural hemorrhage with associated mass effect, without change when compared to the previous day's CT. There is no new intracranial hemorrhage. No hydrocephalus. ____________________________________________   DIFFERENTIAL DIAGNOSIS   Subdural hematoma, herniation, medication side effect  FINAL ASSESSMENT AND PLAN  Dementia, subacute subdural   Plan: The patient had presented for possible altered mental status. Patient's labs did not reveal any acute process. Patient's imaging did not reveal any interval change in his subdural.  He will continue outpatient follow-up as scheduled in 2 weeks with neurosurgery.  I do not see any interval change, there was concerned about change in his mental status and need for hospice.  I do not see a need for this.  He can ambulate and eat and drink.   Laurence Aly, MD    Note: This note was generated in part or whole with voice recognition software. Voice recognition  is usually quite accurate but there are transcription errors that can and very often do occur. I apologize for any typographical errors that were not detected and corrected.     Earleen Newport, MD 03/09/18 [phone]

## 2018-03-09 NOTE — ED Notes (Signed)
Pt changed with peri care and new brief applied.

## 2018-03-09 NOTE — ED Triage Notes (Signed)
Pt here for hospice consult. Pt was here yesterday and dx with SDH and sent home.  Pt has pinpoint pupils. Opened eyes on moving to ED bed from EMS stretcher.  Not answer questions. Dementia at baseline, unsure of baseline before SDH and after.  Per EMS family was giving oxycodone for pain.  Per EMS, family felt unable to be able to give care needed to pt.

## 2018-03-09 NOTE — ED Notes (Signed)
Pt being sent home with caregiver. All discharge information reviewed and understood at this time. Dr. Jimmye Norman spoke to pt's son and is OK with plan to discharge.

## 2018-03-10 LAB — URINE CULTURE: SPECIAL REQUESTS: NORMAL

## 2018-03-11 ENCOUNTER — Other Ambulatory Visit: Payer: Self-pay | Admitting: Nurse Practitioner

## 2018-03-11 DIAGNOSIS — F0281 Dementia in other diseases classified elsewhere with behavioral disturbance: Secondary | ICD-10-CM

## 2018-03-11 DIAGNOSIS — G4700 Insomnia, unspecified: Secondary | ICD-10-CM

## 2018-03-11 DIAGNOSIS — F419 Anxiety disorder, unspecified: Secondary | ICD-10-CM

## 2018-03-11 DIAGNOSIS — G309 Alzheimer's disease, unspecified: Secondary | ICD-10-CM

## 2018-03-11 NOTE — Progress Notes (Signed)
ED culture report follow up from the ED visit on 03/08/2018. Patient was discharged on Keflex for UTI but today his urine culture is growing 60 K/mL VRE. Spoke with Dr. Alfred Levins in ED who recommended switching to ampicillin 500 mg po Q6H x 7 days. I also reached out to Mr. Mutch primary care giver and son, Mr. Liskey, who understands the need to switch and asks that the prescription be called in to [facility] on [address]. Brittney, RPh at [facility], will work on the prescription and will remind him to stop the Keflex and start ampicillin 500 mg po Q6H x 7 days.  Tayli Buch A. Jordan Hawks, PharmD, BCPS Clinical Pharmacist 03/11/2018 13:25

## 2018-05-25 DEATH — deceased

## 2018-08-08 ENCOUNTER — Ambulatory Visit: Payer: Self-pay

## 2018-08-16 ENCOUNTER — Encounter: Payer: Medicare Other | Admitting: Nurse Practitioner

## 2018-08-16 ENCOUNTER — Encounter: Payer: Self-pay | Admitting: Nurse Practitioner

## 2020-01-03 ENCOUNTER — Telehealth: Payer: Self-pay

## 2020-01-03 NOTE — Telephone Encounter (Signed)
Letter received from Derrek Gu the patient family lawyer. Per letter they need a letter stating that the patient needed full time care and was unable to stay at home alone safely any longer.    Reviewed pt chart pt last seen 2 years before his passing.

## 2020-01-04 NOTE — Telephone Encounter (Signed)
I do not feel comfortable attesting to that as it has been over 3 1/2 years since I last saw the patient and he had multiple health issues since that last visit that probably contributed to his status prior to his death.  I would recommend that the letter be prepared by his PCP as she had seen him much more recent prior to his passing.

## 2020-10-29 ENCOUNTER — Telehealth: Payer: Self-pay | Admitting: Neurology

## 2020-10-29 NOTE — Telephone Encounter (Signed)
See note 12/2019. Advised pt  son Walker Shadow has seen his father since 2018.   Gave number to Medical records so pt son can request records to give to the insurance.

## 2020-10-29 NOTE — Telephone Encounter (Signed)
Patient son needs to speak to someone about patient being DX with Memory issues  and needing it for insurance reason
